# Patient Record
Sex: Female | Born: 1965 | Race: White | Hispanic: No | Smoking: Former smoker
Health system: Southern US, Community
[De-identification: ages and names within clinical notes are randomized; demographics above are authoritative.]

## PROBLEM LIST (undated history)

## (undated) DIAGNOSIS — K219 Gastro-esophageal reflux disease without esophagitis: Secondary | ICD-10-CM

## (undated) DIAGNOSIS — M1990 Unspecified osteoarthritis, unspecified site: Secondary | ICD-10-CM

## (undated) DIAGNOSIS — R0600 Dyspnea, unspecified: Secondary | ICD-10-CM

## (undated) DIAGNOSIS — R51 Headache: Secondary | ICD-10-CM

## (undated) DIAGNOSIS — K2270 Barrett's esophagus without dysplasia: Secondary | ICD-10-CM

## (undated) DIAGNOSIS — M722 Plantar fascial fibromatosis: Secondary | ICD-10-CM

## (undated) DIAGNOSIS — F909 Attention-deficit hyperactivity disorder, unspecified type: Secondary | ICD-10-CM

## (undated) DIAGNOSIS — F4310 Post-traumatic stress disorder, unspecified: Secondary | ICD-10-CM

## (undated) DIAGNOSIS — C320 Malignant neoplasm of glottis: Secondary | ICD-10-CM

## (undated) DIAGNOSIS — L409 Psoriasis, unspecified: Secondary | ICD-10-CM

## (undated) DIAGNOSIS — R232 Flushing: Secondary | ICD-10-CM

## (undated) DIAGNOSIS — F32A Depression, unspecified: Secondary | ICD-10-CM

## (undated) DIAGNOSIS — R519 Headache, unspecified: Secondary | ICD-10-CM

## (undated) DIAGNOSIS — I739 Peripheral vascular disease, unspecified: Secondary | ICD-10-CM

## (undated) DIAGNOSIS — F419 Anxiety disorder, unspecified: Secondary | ICD-10-CM

## (undated) DIAGNOSIS — E669 Obesity, unspecified: Secondary | ICD-10-CM

## (undated) DIAGNOSIS — K589 Irritable bowel syndrome without diarrhea: Secondary | ICD-10-CM

## (undated) DIAGNOSIS — F329 Major depressive disorder, single episode, unspecified: Secondary | ICD-10-CM

## (undated) DIAGNOSIS — G8929 Other chronic pain: Secondary | ICD-10-CM

## (undated) DIAGNOSIS — M797 Fibromyalgia: Secondary | ICD-10-CM

## (undated) DIAGNOSIS — R1031 Right lower quadrant pain: Secondary | ICD-10-CM

## (undated) DIAGNOSIS — J449 Chronic obstructive pulmonary disease, unspecified: Secondary | ICD-10-CM

## (undated) HISTORY — PX: BACK SURGERY: SHX140

## (undated) HISTORY — DX: Right lower quadrant pain: R10.31

## (undated) HISTORY — DX: Psoriasis, unspecified: L40.9

## (undated) HISTORY — DX: Barrett's esophagus without dysplasia: K22.70

## (undated) HISTORY — DX: Post-traumatic stress disorder, unspecified: F43.10

## (undated) HISTORY — PX: CARPAL TUNNEL RELEASE: SHX101

## (undated) HISTORY — DX: Irritable bowel syndrome, unspecified: K58.9

## (undated) HISTORY — PX: TUBAL LIGATION: SHX77

## (undated) HISTORY — PX: CLEFT PALATE REPAIR: SUR1165

## (undated) HISTORY — PX: NOSE SURGERY: SHX723

## (undated) HISTORY — PX: ENDOMETRIAL ABLATION: SHX621

## (undated) HISTORY — DX: Attention-deficit hyperactivity disorder, unspecified type: F90.9

## (undated) HISTORY — PX: LAPAROSCOPIC TUBAL LIGATION: SHX1937

## (undated) HISTORY — PX: TONSILLECTOMY: SUR1361

## (undated) HISTORY — DX: Flushing: R23.2

## (undated) HISTORY — DX: Peripheral vascular disease, unspecified: I73.9

## (undated) HISTORY — DX: Plantar fascial fibromatosis: M72.2

## (undated) HISTORY — DX: Obesity, unspecified: E66.9

---

## 1998-07-29 HISTORY — PX: THROAT SURGERY: SHX803

## 1999-08-11 ENCOUNTER — Ambulatory Visit: Admission: RE | Admit: 1999-08-11 | Discharge: 1999-08-11 | Payer: Self-pay | Admitting: Neurology

## 2000-03-14 ENCOUNTER — Encounter (INDEPENDENT_AMBULATORY_CARE_PROVIDER_SITE_OTHER): Payer: Self-pay | Admitting: Specialist

## 2000-03-14 ENCOUNTER — Ambulatory Visit (HOSPITAL_COMMUNITY): Admission: RE | Admit: 2000-03-14 | Discharge: 2000-03-15 | Payer: Self-pay | Admitting: *Deleted

## 2001-02-09 ENCOUNTER — Other Ambulatory Visit: Admission: RE | Admit: 2001-02-09 | Discharge: 2001-02-09 | Payer: Self-pay | Admitting: Orthopedic Surgery

## 2001-03-03 ENCOUNTER — Encounter: Admission: RE | Admit: 2001-03-03 | Discharge: 2001-03-24 | Payer: Self-pay | Admitting: Orthopedic Surgery

## 2002-06-21 ENCOUNTER — Ambulatory Visit (HOSPITAL_COMMUNITY): Admission: RE | Admit: 2002-06-21 | Discharge: 2002-06-21 | Payer: Self-pay | Admitting: *Deleted

## 2006-12-12 ENCOUNTER — Ambulatory Visit (HOSPITAL_COMMUNITY): Admission: RE | Admit: 2006-12-12 | Discharge: 2006-12-12 | Payer: Self-pay | Admitting: Emergency Medicine

## 2006-12-16 ENCOUNTER — Other Ambulatory Visit: Admission: RE | Admit: 2006-12-16 | Discharge: 2006-12-16 | Payer: Self-pay | Admitting: Unknown Physician Specialty

## 2006-12-16 ENCOUNTER — Encounter (INDEPENDENT_AMBULATORY_CARE_PROVIDER_SITE_OTHER): Payer: Self-pay | Admitting: Unknown Physician Specialty

## 2006-12-16 ENCOUNTER — Ambulatory Visit (HOSPITAL_COMMUNITY): Admission: RE | Admit: 2006-12-16 | Discharge: 2006-12-16 | Payer: Self-pay | Admitting: Family Medicine

## 2007-01-28 ENCOUNTER — Ambulatory Visit (HOSPITAL_COMMUNITY): Admission: RE | Admit: 2007-01-28 | Discharge: 2007-01-28 | Payer: Self-pay | Admitting: Family Medicine

## 2007-02-09 ENCOUNTER — Ambulatory Visit (HOSPITAL_COMMUNITY): Admission: RE | Admit: 2007-02-09 | Discharge: 2007-02-09 | Payer: Self-pay | Admitting: Obstetrics and Gynecology

## 2007-02-16 ENCOUNTER — Encounter: Admission: RE | Admit: 2007-02-16 | Discharge: 2007-02-16 | Payer: Self-pay | Admitting: Family Medicine

## 2007-02-16 ENCOUNTER — Encounter (INDEPENDENT_AMBULATORY_CARE_PROVIDER_SITE_OTHER): Payer: Self-pay | Admitting: Diagnostic Radiology

## 2007-08-19 ENCOUNTER — Ambulatory Visit (HOSPITAL_COMMUNITY): Admission: RE | Admit: 2007-08-19 | Discharge: 2007-08-19 | Payer: Self-pay | Admitting: Family Medicine

## 2008-02-22 ENCOUNTER — Ambulatory Visit (HOSPITAL_COMMUNITY): Admission: RE | Admit: 2008-02-22 | Discharge: 2008-02-22 | Payer: Self-pay | Admitting: Family Medicine

## 2008-05-14 ENCOUNTER — Emergency Department (HOSPITAL_COMMUNITY): Admission: EM | Admit: 2008-05-14 | Discharge: 2008-05-14 | Payer: Self-pay | Admitting: Emergency Medicine

## 2009-02-22 ENCOUNTER — Ambulatory Visit (HOSPITAL_COMMUNITY): Admission: RE | Admit: 2009-02-22 | Discharge: 2009-02-22 | Payer: Self-pay | Admitting: Family Medicine

## 2009-04-28 ENCOUNTER — Encounter: Admission: RE | Admit: 2009-04-28 | Discharge: 2009-04-28 | Payer: Self-pay | Admitting: Neurology

## 2009-05-12 ENCOUNTER — Encounter: Admission: RE | Admit: 2009-05-12 | Discharge: 2009-05-12 | Payer: Self-pay | Admitting: Neurology

## 2009-06-08 ENCOUNTER — Encounter: Admission: RE | Admit: 2009-06-08 | Discharge: 2009-06-08 | Payer: Self-pay | Admitting: Neurology

## 2009-09-12 ENCOUNTER — Encounter: Admission: RE | Admit: 2009-09-12 | Discharge: 2009-12-11 | Payer: Self-pay | Admitting: Neurology

## 2009-11-02 DIAGNOSIS — R109 Unspecified abdominal pain: Secondary | ICD-10-CM | POA: Insufficient documentation

## 2009-11-02 DIAGNOSIS — M549 Dorsalgia, unspecified: Secondary | ICD-10-CM | POA: Insufficient documentation

## 2009-11-02 DIAGNOSIS — G2581 Restless legs syndrome: Secondary | ICD-10-CM

## 2009-11-02 DIAGNOSIS — R197 Diarrhea, unspecified: Secondary | ICD-10-CM | POA: Insufficient documentation

## 2009-11-02 DIAGNOSIS — R110 Nausea: Secondary | ICD-10-CM

## 2009-11-02 DIAGNOSIS — K5900 Constipation, unspecified: Secondary | ICD-10-CM

## 2009-11-02 DIAGNOSIS — F329 Major depressive disorder, single episode, unspecified: Secondary | ICD-10-CM

## 2009-11-02 DIAGNOSIS — Z8719 Personal history of other diseases of the digestive system: Secondary | ICD-10-CM

## 2009-11-02 DIAGNOSIS — K589 Irritable bowel syndrome without diarrhea: Secondary | ICD-10-CM

## 2009-11-02 DIAGNOSIS — K5909 Other constipation: Secondary | ICD-10-CM | POA: Insufficient documentation

## 2009-11-02 DIAGNOSIS — K219 Gastro-esophageal reflux disease without esophagitis: Secondary | ICD-10-CM

## 2009-11-02 DIAGNOSIS — M129 Arthropathy, unspecified: Secondary | ICD-10-CM | POA: Insufficient documentation

## 2009-11-03 ENCOUNTER — Ambulatory Visit: Payer: Self-pay | Admitting: Gastroenterology

## 2009-11-03 DIAGNOSIS — A048 Other specified bacterial intestinal infections: Secondary | ICD-10-CM | POA: Insufficient documentation

## 2009-11-08 ENCOUNTER — Encounter: Payer: Self-pay | Admitting: Gastroenterology

## 2009-12-13 ENCOUNTER — Ambulatory Visit: Payer: Self-pay | Admitting: Internal Medicine

## 2009-12-20 ENCOUNTER — Ambulatory Visit: Payer: Self-pay | Admitting: Gastroenterology

## 2010-02-26 ENCOUNTER — Ambulatory Visit (HOSPITAL_COMMUNITY): Admission: RE | Admit: 2010-02-26 | Discharge: 2010-02-26 | Payer: Self-pay | Admitting: Family Medicine

## 2010-08-28 NOTE — Assessment & Plan Note (Signed)
Summary: dropped off stool/ss   Pt returned on ifobt and it was negative.     Allergies: No Known Drug Allergies  Other Orders: Immuno-chemical Fecal Occult (16109)  Appended Document: dropped off stool/ss pt aware

## 2010-08-28 NOTE — Assessment & Plan Note (Signed)
Summary: fu6wks, abd pain,h pylori infection.gu   Visit Type:  f/u Primary Care Provider:  Freida Busman, NP  Chief Complaint:  follow up- doing better.  History of Present Illness: Feeling better. Less cramps. Having BM about every other day on Colace (3) at bedtime. Never started Benefiber, states fiber makes her nauseated and doesn't seem to help. Passed hard stool with blood mixed in the day she collected stool for H.Pylori. No blood noted since. No melena. Still some bloating but better. Recent H.Pylori stool Ag negative.    Current Medications (verified): 1)  Nexium 40 Mg Pack (Esomeprazole Magnesium) .... Once Daily 2)  Levsin Sl .... As Directed 3)  Diclofenac Sodium 75 Mg Tbec (Diclofenac Sodium) .... Two Times A Day 4)  Nortriptyline Hcl 10 Mg Caps (Nortriptyline Hcl) .... 3 At Bedtime 5)  Ultracet 37.5-325 Mg Tabs (Tramadol-Acetaminophen) .... 2 Two Times A Day 6)  Probiotic Digestive Advantage .... Once Daily 7)  Colace 100 Mg Caps (Docusate Sodium) .... 3 By Mouth Qhs  Allergies (verified): No Known Drug Allergies  Review of Systems      See HPI  Vital Signs:  Patient profile:   45 year old female Height:      66 inches Weight:      249 pounds BMI:     40.33 Temp:     98.6 degrees F oral Pulse rate:   68 / minute BP sitting:   110 / 70  (left arm) Cuff size:   large  Vitals Entered By: Hendricks Limes LPN (Dec 13, 2009 10:14 AM)  Physical Exam  General:  Well developed, well nourished, no acute distress. Head:  Normocephalic and atraumatic. Eyes:  Sclera nonicteric. Mouth:  OP moist. Abdomen:  Bowel sounds normal.  Abdomen is soft, nontender, nondistended.  No rebound or guarding.  No hepatosplenomegaly, masses or hernias.  No abdominal bruits.  Extremities:  No clubbing, cyanosis, edema or deformities noted. Neurologic:  Alert and  oriented x4;  grossly normal neurologically. Skin:  Intact without significant lesions or rashes. Psych:  Alert and cooperative.  Normal mood and affect.  Impression & Recommendations:  Problem # 1:  IRRITABLE BOWEL SYNDROME (ICD-564.1)  Doing better on probiotics/levsin. Less cramping. Still with some bloating and constipation. Add Miralax 17 grams by mouth daily.   Orders: Est. Patient Level II (81191)  Problem # 2:  RECTAL BLEEDING, HX OF (ICD-V12.79)  Recently saw fresh blood mixed in stool X 1. Last TCS 2003, patchy colitis/proctitis possible due to NSAIDS/ASA at that time. Will discuss with Dr. Darrick Penna. Consider repeat TCS. Check ifobt.  Orders: Est. Patient Level II (47829)  Problem # 3:  HELICOBACTER PYLORI [H. PYLORI] INFECTION (ICD-041.86) Eradicated, based on negative H.Pylori stool Ag.  Problem # 4:  GASTROESOPHAGEAL REFLUX DISEASE, CHRONIC (ICD-530.81) Controlled on Nexium.  Patient Instructions: 1)  Add Miralax 17 grams by mouth daily for constipation. 2)  Complete stool ifobt and return to office. 3)  The medication list was reviewed and reconciled.  All changed / newly prescribed medications were explained.  A complete medication list was provided to the patient / caregiver.  Appended Document: fu6wks, abd pain,h pylori infection.gu Please let patient know, [facility] recommends TCS for further evaluation of recent rectal bleeding. Please schedule.   Appended Document: fu6wks, abd pain,h pylori infection.gu pt aware, she does not have any insurance and gets 60%assistance from [facility], but she is still scared it will cost too much. encouraged pt to call Luber[facility] and see if there  was anything she could do. pt stated she would and will call us back and let us know what she is going to do. Informed pt ifobt is negative.
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
## 2010-12-14 NOTE — Op Note (Signed)
NAME:  [name], [name]                          ACCOUNT NO.:  [id number]   MEDICAL RECORD NO.:  [id number]                   PATIENT TYPE:  AMB   LOCATION:  DAY                                  FACILITY:  [facility]   PHYSICIAN:  Lionel December, M.D.                 DATE OF BIRTH:  [birth year]   DATE OF PROCEDURE:  06/21/2002  DATE OF DISCHARGE:                                 OPERATIVE REPORT   PROCEDURE:  Esophagogastroduodenoscopy followed by total colonoscopy.   INDICATIONS FOR PROCEDURE:  An active 45 year old Caucasian female who  presents with acute onset of LLQ abdominal pain, rectal bleeding and melena.  She also gives a history of heartburn of 4-5 years duration and presently on  PPI.  Therefore an EGG was also recommended. Both the procedure and risks  were reviewed with the patient and informed consent was obtained.   PREOP MEDICATIONS:  Cetacaine spray for pharyngeal topical anesthesia,  Demerol 75 mg IV in divided dose, Versed 10 mg IV in divided dose.   INSTRUMENT:  Olympus video system.   FINDING:  The procedure performed in endoscopy suite. The patient's vital  signs and O2 saturations were monitored during the procedure and remained  stable.   1. Esophagogastroduodenoscopy.  The patient was placed in the left lateral     decubitus position and the scope passed through the oropharynx without     any difficulty into the esophagus.   ESOPHAGUS:  The mucosa of the esophagus normal except there was a linear  erosion in the distal esophagus along with another erosion at the GE  junction. The mucosa of the GE junction was serrated with two small  extensions of gastric type mucosa in a cephalad direction. This was high  lighted on endoscopic pictures. There was a small sliding hiatal hernia.   STOMACH:  It was empty and distended very well with insufflation. The folds  in the proximal stomach are normal. Examination of the mucosa at the gastric  body and antrum revealed  granularity and patchy erythema. There were no  erosions or ulcers. The pyloric channel was patent. The rest of the fundus  was examined by retroflexing the scope and was normal.   DUODENUM:  Examination of the bulb and second part of the duodenum was  normal. The endoscope was withdrawn and the patient was prepared for  procedure #2.   TOTAL COLONOSCOPY.  Rectal examination performed. No abnormalities noted on  external or digital exam. The scope was placed in the rectum where there was  intense erythema to the mucosa and rectosigmoid junction with multiple  erosions. Less pronounced changes were seen in the distal sigmoid colon. The  mucosa above that was normal. Preparation was fair. She had thick liquid  stool in the right colon. The scope was passed to the cecum which was  identified by the appendiceal orifice and the ileocecal valve. A  picture of  the appendiceal orifice was taken for the record. As the scope was  withdrawn, the colonic mucosa was once again carefully examined. Patchy  erythema and few erosions were noted at the distal sigmoid colon with most  pronounced changes at rectosigmoid and rectum. Biopsies taken from the  rectosigmoid junction and submitted in one container. While in the rectum,  the scope was retroflexed to examine the anorectum and she had small  hemorrhoids below the dentate line. The endoscope was straightened and  withdrawn. The patient tolerated the procedure well.   FINAL DIAGNOSES:  1. Erosive reflux esophagitis with serrated GE junction and a small sliding     hiatal hernia.  2. Nonerosive gastritis.  3. Patchy colitis involving the rectum and distal sigmoid colon which would     explain her acute symptomatology. This could be an infection or     nonspecific.   RECOMMENDATIONS:  1. She will continue antireflux measures and Nexium as before.  2. She must try to lose some weight.  3. H. pylori serology was checked today.  4. Stool sample was  obtained and sent to lab for cultures for enteric     pathogens.  5. Will start her on Rowasa enema per rectum at bedtime for a total of two     weeks.  6. I will be contacting the patient with results of pending studies and     further recommendations.                                               Lionel December, M.D.    NR/MEDQ  D:  06/21/2002  T:  06/21/2002  Job:  [id number]   cc:   [address]  [place]  [place] [address]  Fax: [phone]
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
## 2010-12-14 NOTE — Op Note (Signed)
[place]. [facility]  Patient:    [name], [name] Visit Number: [id number] MRN: [id number]          Service Type: DSU Location: [id number] Attending Physician:  Carlena Sax Proc. Date: 03/14/00 Admit Date:  03/14/2000 Discharge Date: 03/15/2000   CC:         Barbaraann Share, M.D. [facility]  Marlan Palau, M.D.   Operative Report  PREOPERATIVE DIAGNOSIS: Obstructive sleep apnea and nasal septal deviation, inferior turbinate hypertrophy, elongated soft palate and uvula and palate hypertrophy.  POSTOPERATIVE DIAGNOSIS: Obstructive sleep apnea and nasal septal deviation, inferior turbinate hypertrophy, elongated soft palate and uvula and palate hypertrophy.  PROCEDURE PERFORMED:  Tonsillectomy, uvulopalatopharyngeoplasty, nasal septal reconstruction, ______  cauterization of both inferior turbinates.  SURGEON:  Veverly Fells. Arletha Grippe, M.D.  ANESTHESIA:  General endotracheal.  INDICATIONS FOR SURGERY:  This is a 45 year old white female gives a history of daytime somnolence, morning fatigue and loud snoring at night and nasal airway obstruction.  She has had an inpatient polysomnogram this revealed mild obstructive sleep apnea.  She has tried to use nasal CPAP but is a stomach sleeper and is unable too tolerate it due to the way that she sleeps at home. Physical examination does reveal a septal deviation to the right, congestion of the inferior turbinates, elongated soft palate and uvula and tonsillar hypertrophy.  She has failed aggressive medical therapy I have recommended proceeding with the above noted surgical procedure.  I have discussed extensively with her the risk and benefits of surgery including risk of general anesthesia, infection, bleeding, uvulopharyngeal insufficiency and the normal recovery period after this type of surgery.  I have entertained questions answered them appropriate and informed consent has been obtained and patient  presents for the above noted procedure.  OPERATIVE FINDINGS:  Septal deviation, right inferior turbinate congestion and hypertrophy, elongated soft palate and uvula and mild tonsillar hypertrophy.  DETAILS OF PROCEDURE PERFORMED:  The patient was placed in the supine position general endotracheal anesthesia was administered via the anesthesiologist without complications.  She was administered 1 gram of Ancef IV times one and 10 mg of Decadron IV times one.  The head of the table was turned 90 degrees patients face was draped in the standard fashion.  Crowe-Davis mouth retractor was inserted in the oral cavity this was used to retract the mouth open.  Curved Allis clamp was used to grasp the right tonsil and retracted medially.  The hormonic scalpel was then used to dissect the tonsil free from the tonsillar fossa and bleeding was controlled with a combination of hormonic scalpel and suction cautery without difficulty.  The tonsil was then transected at the tongue base and removed through the oral cavity and sent to pathology for permanent section analysis.  Next attention was turned to the left tonsil and an identical procedure was carried out on this side compared to the right side with identical results. The bleeding from the tonsillar fossa was controlled meticulously with suction cautery without difficulty.  Next the uvula was then transected right at the junction of the soft palate using the hormonic scalpel without difficulty.  The cut edges of the uvula and soft palate and left tonsillar pillar were reapproximated with interrupted 3-0 Vicryl suture without difficulty.  There was not enough redundant pharyngeal mucosa to close the right tonsillar fossa so that was left to [facility] on its own.  Four cc of 0.5% Marcaine solution with 1:200,000 epinephrine were infiltrated into the anterior tonsillar  pillars and slouged out in tongue base bilaterally.  Crowe-Davis mouth retractor  was released and brought out through the oral cavity without incident.  Next attention was turned to the nasal chamber.  Cotton pledgets soaked in a 4% cocaine solution was placed on either side of the septum and left in the nasal chambers for approximately five to ten minutes then removed.  Both sides were suctioned and infiltrated with a 1% lidocaine solution with 1:1000 epinephrine.  After waiting approximately ten minutes the standard Kelly incision was made in the left side of the septum and the mucoperichondrium mucoperiosteal flap was elevated in the left side using both blunt and sharp dissection.  An intercartilaginous incision was made approximately 1 cm posterior from the caudal end of the septum.  The mucoperichondrium and mucoperiosteal flap on the right side using both blunt and sharp dissection. Cartilaginous deviation was was moved further down ______.  The posterior bony destruction was removed with the Jansen-Middleton forceps.  Trimmed morcellized cartilage was placed in between the septal flaps the septal incision was closed with interrupted chromic suture it was reinforced with a running transseptal plain gut mattress stitch.  Both inferior turbinates were injected with a total of 6 cc of a 1% lidocaine solution with 1:1000 epinephrine and both inferior turbinates were then cauterized using the Elmed instrument cauterization using a 12 watt setting.  Both inferior turbinates were treated with three passes and were outfractured using central lateral pressure with a large nasal speculum.  The splints soaked in a Bactroban ointment solution were placed on either side of the septum held in place with a transseptal Prolene suture.  An oral gastric tube was placed this was used to decompress the stomach contents this was then removed without incident. Fluids given during the procedure approximately 1 liter of crystalloid.  Estimated blood loss was less than 50 cc, urine was  not measured.  There were no drains, two dural splints were placed.  Specimens sent were septal cartilage and bone for gross pathology only, tonsils times two and uvula portion of soft palate.  The patient tolerated the procedure well without complications, extubated in the operating room and transferred to the recovery room in stable condition. The sponge, needle and instrument counts were correct during procedure.  Total duration of procedure was approximately 2 hours.  The patient will be admitted for overnight recovery once she has recovered well she will be sent home on March 15, 2000.  She will be sent home on Augmentin Elixir 400 mg p.o. t.i.d. for two weeks and Lortab Elixir 250 cc with two refills 10-15 cc p.o. q4h p.r.n. pain.  She is to have light activity, postoperative recommended diet for two weeks after surgery.  Both her and her friend were given oral and written instructions.  They are to call for any problems of bleeding, fever or vomiting, pain or extra medications or any questions.  She will follow up for splint removal on Thursday August 23rd at 1:50 PM. Attending Physician:  Carlena Sax DD:  03/14/00 TD:  03/15/00 Job: [id number]

## 2011-04-30 LAB — POCT I-STAT, CHEM 8
BUN: 14
Calcium, Ion: 0.95 — ABNORMAL LOW
Chloride: 110
Creatinine, Ser: 0.8
Glucose, Bld: 102 — ABNORMAL HIGH
HCT: 43
Hemoglobin: 14.6
Potassium: 5.1
Sodium: 137
TCO2: 23

## 2011-04-30 LAB — POCT CARDIAC MARKERS
CKMB, poc: <1 — ABNORMAL LOW
Myoglobin, poc: 42.4
Troponin i, poc: <0.05

## 2011-05-23 ENCOUNTER — Emergency Department (HOSPITAL_BASED_OUTPATIENT_CLINIC_OR_DEPARTMENT_OTHER)
Admission: EM | Admit: 2011-05-23 | Discharge: 2011-05-23 | Disposition: A | Discharge status: Home / Self Care | Payer: Self-pay | Attending: Emergency Medicine | Admitting: Emergency Medicine

## 2011-05-23 ENCOUNTER — Emergency Department (INDEPENDENT_AMBULATORY_CARE_PROVIDER_SITE_OTHER): Payer: Self-pay

## 2011-05-23 ENCOUNTER — Encounter: Payer: Self-pay | Admitting: *Deleted

## 2011-05-23 DIAGNOSIS — F17200 Nicotine dependence, unspecified, uncomplicated: Secondary | ICD-10-CM | POA: Insufficient documentation

## 2011-05-23 DIAGNOSIS — K219 Gastro-esophageal reflux disease without esophagitis: Secondary | ICD-10-CM | POA: Insufficient documentation

## 2011-05-23 DIAGNOSIS — M549 Dorsalgia, unspecified: Secondary | ICD-10-CM

## 2011-05-23 DIAGNOSIS — M479 Spondylosis, unspecified: Secondary | ICD-10-CM

## 2011-05-23 DIAGNOSIS — M545 Low back pain: Secondary | ICD-10-CM

## 2011-05-23 DIAGNOSIS — M47817 Spondylosis without myelopathy or radiculopathy, lumbosacral region: Secondary | ICD-10-CM | POA: Insufficient documentation

## 2011-05-23 DIAGNOSIS — M5126 Other intervertebral disc displacement, lumbar region: Secondary | ICD-10-CM

## 2011-05-23 DIAGNOSIS — Z79899 Other long term (current) drug therapy: Secondary | ICD-10-CM | POA: Insufficient documentation

## 2011-05-23 HISTORY — DX: Gastro-esophageal reflux disease without esophagitis: K21.9

## 2011-05-23 HISTORY — DX: Malignant neoplasm of glottis: C32.0

## 2011-05-23 MED ORDER — OXYCODONE-ACETAMINOPHEN 5-325 MG PO TABS
1.0000 | ORAL_TABLET | Freq: Once | ORAL | Status: AC
  Administered 2011-05-23: 1 via ORAL
  Filled 2011-05-23: qty 1

## 2011-05-23 MED ORDER — DIAZEPAM 5 MG PO TABS
5.0000 mg | ORAL_TABLET | Freq: Once | ORAL | Status: AC
  Administered 2011-05-23: 5 mg via ORAL
  Filled 2011-05-23: qty 1

## 2011-05-23 MED ORDER — OXYCODONE-ACETAMINOPHEN 5-325 MG PO TABS: 2.0000 | ORAL_TABLET | ORAL | Status: AC | PRN

## 2011-05-23 MED ORDER — METHOCARBAMOL 500 MG PO TABS: 500.0000 mg | ORAL_TABLET | Freq: Two times a day (BID) | ORAL | Status: AC

## 2011-05-23 NOTE — ED Provider Notes (Signed)
History     CSN: [id number] Arrival date & time: 05/23/2011  8:49 PM   First MD Initiated Contact with Patient 05/23/11 2053      Chief Complaint  Patient presents with  . Back Pain    (Consider location/radiation/quality/duration/timing/severity/associated sxs/prior treatment) HPI Comments: Patient complains of 4 days of low back pain is worsening. It is across the entire low back in the midline and radiates into her left buttock and lower leg. There is no associated weakness, numbness, tingling. She denies any bowel or bladder incontinence. She denies any fever or vomiting. She taking Ultracet without relief. 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  Patient is a 45 y.o. female presenting with back pain. The history is provided by the patient.  Back Pain  The current episode started more than 2 days ago. The problem occurs constantly. The problem has been gradually worsening. The pain is associated with lifting heavy objects and twisting. The pain is present in the lumbar spine and sacro-iliac joint. The quality of the pain is described as aching. The pain radiates to the left thigh. The pain is moderate. The symptoms are aggravated by bending and twisting. Pertinent negatives include no chest pain, no fever, no numbness, no abdominal pain, no bowel incontinence, no bladder incontinence, no dysuria, no paresthesias and no weakness. She has tried NSAIDs and analgesics for the symptoms. The treatment provided mild relief.    Past Medical History  Diagnosis Date  . GERD (gastroesophageal reflux disease)   . Cancer   . Vocal cord cancer     Past Surgical History  Procedure Date  . Carpal tunnel release   . Nose surgery   . Tonsillectomy   . Cleft palate repair     No family history on file.  History  Substance Use Topics  . Smoking status: Current Everyday Smoker -- 0.5 packs/day  . Smokeless tobacco: Not on  file  . Alcohol Use: Yes     occasional    OB History    Grav Para Term Preterm Abortions TAB SAB Ect Mult Living                  Review of Systems  Constitutional: Negative for fever, activity change and appetite change.  HENT: Negative for rhinorrhea.   Cardiovascular: Negative for chest pain.  Gastrointestinal: Negative for nausea, vomiting, abdominal pain and bowel incontinence.  Genitourinary: Negative for bladder incontinence, dysuria, frequency and hematuria.  Musculoskeletal: Positive for back pain.  Neurological: Negative for weakness, numbness and paresthesias.    Allergies  Review of patient's allergies indicates no known allergies.  Home Medications   Current Outpatient Rx  Name Route Sig Dispense Refill  . ESOMEPRAZOLE MAGNESIUM 40 MG PO CPDR Oral Take 40 mg by mouth daily.      Marland Kitchen PROBIOTIC FORMULA PO Oral Take 1 tablet by mouth daily.      . TRAMADOL-ACETAMINOPHEN 37.5-325 MG PO TABS Oral Take 2 tablets by mouth 2 (two) times daily.      Marland Kitchen METHOCARBAMOL 500 MG PO TABS Oral Take 1 tablet (500 mg total) by mouth 2 (two) times daily. 20 tablet 0  . OXYCODONE-ACETAMINOPHEN 5-325 MG PO TABS Oral Take 2 tablets by mouth every 4 (four) hours as needed for pain. 15 tablet 0    BP 156/78  Pulse 69  Temp(Src) 98.1 F (36.7 C) (Oral)  Resp 18  Ht 5\' 6"  (1.676  m)  Wt 245 lb (111.131 kg)  BMI 39.54 kg/m2  SpO2 100%  LMP 05/02/2011  Physical Exam  Constitutional: She is oriented to person, place, and time. She appears well-developed and well-nourished. No distress.  HENT:  Head: Normocephalic and atraumatic.  Mouth/Throat: Oropharynx is clear and moist. No oropharyngeal exudate.  Eyes: Conjunctivae are normal. Pupils are equal, round, and reactive to light.  Neck: Normal range of motion.  Cardiovascular: Normal rate, regular rhythm and normal heart sounds.   Pulmonary/Chest: Effort normal and breath sounds normal. No respiratory distress.  Abdominal: Soft.  There is no tenderness. There is no rebound and no guarding.  Musculoskeletal:       Tender to palpation in the lumbar and sacral spine no step-off or deformity.  Neurological: She is alert and oriented to person, place, and time. No cranial nerve deficit.       5 out of 5 strength in bilateral lower extremities, positive patellar reflexes bilaterally, able to dorsiflex great toes bilaterally  Skin: Skin is warm.    ED Course  Procedures (including critical care time)  Labs Reviewed - No data to display Dg Lumbar Spine Complete  05/23/2011  *RADIOLOGY REPORT*  Clinical Data: Back pain.  Numbness of left lower extremity.  LUMBAR SPINE - COMPLETE 4+ VIEW  Comparison: Lumbar MRI 06/08/2009.  Findings: No compression fracture or traumatic subluxation.  4 mm of degenerative anterolisthesis L3 on L4, associated disc space narrowing. Lower lumbar facet arthropathy.  Bilateral tubal ligation clips.  Moderate stool burden.  IMPRESSION: L3-L4 spondylosis. 4 mm degenerative slip at this level. This has progressed since 2010.  Original Report Authenticated By: Elsie Stain, M.D.     1. Spondylosis   2. Back pain       MDM  Low back pain with history of lifting, no neurological deficit or red flags.  With midline pain and oncologic history, will obtain imaging and treat symptoms.  Neurosurgery referral provided      Glynn Octave, MD 05/24/11 [phone]

## 2011-05-23 NOTE — ED Notes (Signed)
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

## 2011-06-11 ENCOUNTER — Encounter (HOSPITAL_COMMUNITY): Payer: Self-pay

## 2011-06-11 ENCOUNTER — Emergency Department (HOSPITAL_COMMUNITY)
Admission: EM | Admit: 2011-06-11 | Discharge: 2011-06-11 | Disposition: A | Discharge status: Home / Self Care | Payer: Self-pay | Attending: Emergency Medicine | Admitting: Emergency Medicine

## 2011-06-11 DIAGNOSIS — N939 Abnormal uterine and vaginal bleeding, unspecified: Secondary | ICD-10-CM

## 2011-06-11 DIAGNOSIS — N898 Other specified noninflammatory disorders of vagina: Secondary | ICD-10-CM | POA: Insufficient documentation

## 2011-06-11 DIAGNOSIS — K219 Gastro-esophageal reflux disease without esophagitis: Secondary | ICD-10-CM | POA: Insufficient documentation

## 2011-06-11 DIAGNOSIS — Z8521 Personal history of malignant neoplasm of larynx: Secondary | ICD-10-CM | POA: Insufficient documentation

## 2011-06-11 DIAGNOSIS — F17200 Nicotine dependence, unspecified, uncomplicated: Secondary | ICD-10-CM | POA: Insufficient documentation

## 2011-06-11 LAB — CBC
HCT: 44 % (ref 36.0–46.0)
Hemoglobin: 14.2 g/dL (ref 12.0–15.0)
MCH: 31 pg (ref 26.0–34.0)
MCHC: 32.3 g/dL (ref 30.0–36.0)

## 2011-06-11 LAB — BASIC METABOLIC PANEL
BUN: 16 mg/dL (ref 6–23)
Chloride: 102 mEq/L (ref 96–112)
Creatinine, Ser: 0.94 mg/dL (ref 0.50–1.10)
GFR calc Af Amer: 83 mL/min — ABNORMAL LOW (ref >90)
GFR calc non Af Amer: 72 mL/min — ABNORMAL LOW (ref >90)
Glucose, Bld: 93 mg/dL (ref 70–99)

## 2011-06-11 LAB — DIFFERENTIAL
Basophils Relative: 0 % (ref 0–1)
Eosinophils Absolute: 0.1 10*3/uL (ref 0.0–0.7)
Lymphs Abs: 3.6 10*3/uL (ref 0.7–4.0)
Monocytes Absolute: 0.7 10*3/uL (ref 0.1–1.0)
Monocytes Relative: 8 % (ref 3–12)

## 2011-06-11 NOTE — ED Provider Notes (Signed)
History    Scribed for Samantha Lennert, MD, the patient was seen in room APA09/APA09. This chart was scribed by Katha Cabal.   CSN: [id number] Arrival date & time: 06/11/2011  8:28 PM   First MD Initiated Contact with Patient 06/11/11 2040      Chief Complaint  Patient presents with  . Vaginal Bleeding    (Consider location/radiation/quality/duration/timing/severity/associated sxs/prior treatment) Patient is a 45 y.o. female presenting with vaginal bleeding. The history is provided by the patient. No language interpreter was used.  Vaginal Bleeding Chronicity: ongoing. Episode onset: about 3 weeks ago. The problem occurs constantly. The problem has not changed since onset.Pertinent negatives include no chest pain and no headaches. The symptoms are relieved by nothing.  Patient states vaginal bleeding at times heavier and at times lighter than normal menstrual bleeding.  Patient reports mild vaginal pain. Symptom is associated with tiredness. Patient taking iron pills for tiredness.  Patient has not had an ultrasound. Patient reports Dagoberto Reef at the clinic does her pelvic exams but patient was referred to ED.     Past Medical History  Diagnosis Date  . GERD (gastroesophageal reflux disease)   . Cancer   . Vocal cord cancer     Past Surgical History  Procedure Date  . Carpal tunnel release   . Nose surgery   . Tonsillectomy   . Cleft palate repair     History reviewed. No pertinent family history.  History  Substance Use Topics  . Smoking status: Current Everyday Smoker -- 0.5 packs/day  . Smokeless tobacco: Not on file  . Alcohol Use: Yes     occasional    OB History    Grav Para Term Preterm Abortions TAB SAB Ect Mult Living                  Review of Systems  Constitutional: Negative for fatigue.  HENT: Negative for congestion, sinus pressure and ear discharge.   Eyes: Negative for discharge.  Respiratory: Negative for cough.   Cardiovascular: Negative  for chest pain.  Gastrointestinal: Negative for nausea, vomiting and diarrhea.  Genitourinary: Positive for vaginal bleeding. Negative for frequency and hematuria.  Musculoskeletal: Negative for back pain.  Skin: Negative for rash.  Neurological: Negative for seizures, light-headedness and headaches.  Hematological: Negative.   Psychiatric/Behavioral: Negative for hallucinations.    Allergies  Bee venom  Home Medications   Current Outpatient Rx  Name Route Sig Dispense Refill  . BC HEADACHE POWDER PO Oral Take 1 packet by mouth as needed. For occasional pain     . CETIRIZINE-PSEUDOEPHEDRINE 5-120 MG PO TB12 Oral Take 1 tablet by mouth daily as needed. For allergies     . ESOMEPRAZOLE MAGNESIUM 40 MG PO CPDR Oral Take 40 mg by mouth daily.      Marland Kitchen PROBIOTIC FORMULA PO Oral Take 1 tablet by mouth daily.      . TRAMADOL-ACETAMINOPHEN 37.5-325 MG PO TABS Oral Take 2 tablets by mouth 2 (two) times daily.        BP 124/78  Pulse 82  Temp(Src) 97.9 F (36.6 C) (Oral)  Resp 22  Ht 5\' 6"  (1.676 m)  Wt 245 lb (111.131 kg)  BMI 39.54 kg/m2  SpO2 99%  LMP 05/21/2011  Physical Exam  Constitutional: She is oriented to person, place, and time. She appears well-developed.  HENT:  Head: Normocephalic and atraumatic.  Eyes: Conjunctivae and EOM are normal. No scleral icterus.  Neck: Neck supple. No thyromegaly present.  Cardiovascular: Normal rate and regular rhythm.  Exam reveals no gallop and no friction rub.   No murmur heard. Pulmonary/Chest: No stridor. She has no wheezes. She has no rales. She exhibits no tenderness.  Abdominal: She exhibits no distension. There is no tenderness. There is no rebound.  Genitourinary: There is bleeding around the vagina. No erythema or tenderness around the vagina. No foreign body around the vagina. No signs of injury around the vagina. No vaginal discharge found.       Mild amount of dark blood in vagina   Musculoskeletal: Normal range of motion. She  exhibits no edema.  Lymphadenopathy:    She has no cervical adenopathy.  Neurological: She is alert and oriented to person, place, and time. Coordination normal.  Skin: No rash noted. No erythema.  Psychiatric: She has a normal mood and affect. Her behavior is normal.    ED Course  Procedures (including critical care time)   DIAGNOSTIC STUDIES: Oxygen Saturation is 99% on room air, normal by my interpretation.    COORDINATION OF CARE:    8:48 PM  Initial exam complete.  Will perform pelvic exam.    9:11 PM  Physical and pelvic exam complete.  11:00 PM  Discussed lab findings with patient.   11:03 PM  Plan to discharge patient.  Patient agrees with plan.     Orders Placed This Encounter  Procedures  . CBC  . Differential  . Basic metabolic panel  . Pregnancy, urine     LABS / RADIOLOGY:    Labs Reviewed  BASIC METABOLIC PANEL - Abnormal; Notable for the following:    GFR calc non Af Amer 72 (*)    GFR calc Af Amer 83 (*)    All other components within normal limits  CBC  DIFFERENTIAL  PREGNANCY, URINE   Results for orders placed during the hospital encounter of 06/11/11  CBC      Component Value Range   WBC 8.5  4.0 - 10.5 (K/uL)   RBC 4.58  3.87 - 5.11 (MIL/uL)   Hemoglobin 14.2  12.0 - 15.0 (g/dL)   HCT 16.1  09.6 - 04.5 (%)   MCV 96.1  78.0 - 100.0 (fL)   MCH 31.0  26.0 - 34.0 (pg)   MCHC 32.3  30.0 - 36.0 (g/dL)   RDW 40.9  81.1 - 91.4 (%)   Platelets 377  150 - 400 (K/uL)  DIFFERENTIAL      Component Value Range   Neutrophils Relative 49  43 - 77 (%)   Neutro Abs 4.2  1.7 - 7.7 (K/uL)   Lymphocytes Relative 42  12 - 46 (%)   Lymphs Abs 3.6  0.7 - 4.0 (K/uL)   Monocytes Relative 8  3 - 12 (%)   Monocytes Absolute 0.7  0.1 - 1.0 (K/uL)   Eosinophils Relative 1  0 - 5 (%)   Eosinophils Absolute 0.1  0.0 - 0.7 (K/uL)   Basophils Relative 0  0 - 1 (%)   Basophils Absolute 0.0  0.0 - 0.1 (K/uL)  BASIC METABOLIC PANEL      Component Value Range    Sodium 139  135 - 145 (mEq/L)   Potassium 3.7  3.5 - 5.1 (mEq/L)   Chloride 102  96 - 112 (mEq/L)   CO2 27  19 - 32 (mEq/L)   Glucose, Bld 93  70 - 99 (mg/dL)   BUN 16  6 - 23 (mg/dL)   Creatinine, Ser 7.82  0.50 -  1.10 (mg/dL)   Calcium 9.8  8.4 - 16.1 (mg/dL)   GFR calc non Af Amer 72 (*) >90 (mL/min)   GFR calc Af Amer 83 (*) >90 (mL/min)  PREGNANCY, URINE      Component Value Range   Preg Test, Ur NEGATIVE       No results found.       MDM        MEDICATIONS GIVEN IN THE E.D. Scheduled Meds:   Continuous Infusions:       IMPRESSION: 1. Vaginal bleeding      DISCHARGE MEDICATIONS: New Prescriptions   No medications on file      The chart was scribed for me under my direct supervision.  I personally performed the history, physical, and medical decision making and all procedures in the evaluation of this patient.Samantha Lennert, MD 06/11/11 [phone]

## 2011-06-11 NOTE — ED Notes (Signed)
C/o having period for 3 weeks. Pt called health dept and was instructed to come here. Pt reports having 2 periods a month. Pt changes her pad "every couple of hours" pt reports feeling weak. A/o x3. resp even/nonlabored. C/o pelvic pain.

## 2011-06-18 ENCOUNTER — Other Ambulatory Visit (HOSPITAL_COMMUNITY): Payer: Self-pay | Admitting: Family Medicine

## 2011-06-18 DIAGNOSIS — Z139 Encounter for screening, unspecified: Secondary | ICD-10-CM

## 2011-06-27 ENCOUNTER — Ambulatory Visit (HOSPITAL_COMMUNITY)
Admission: RE | Admit: 2011-06-27 | Discharge: 2011-06-27 | Disposition: A | Discharge status: Home / Self Care | Payer: PRIVATE HEALTH INSURANCE | Source: Ambulatory Visit | Attending: Family Medicine | Admitting: Family Medicine

## 2011-06-27 DIAGNOSIS — Z1231 Encounter for screening mammogram for malignant neoplasm of breast: Secondary | ICD-10-CM | POA: Insufficient documentation

## 2011-06-27 DIAGNOSIS — Z139 Encounter for screening, unspecified: Secondary | ICD-10-CM

## 2011-08-16 ENCOUNTER — Emergency Department (HOSPITAL_BASED_OUTPATIENT_CLINIC_OR_DEPARTMENT_OTHER)
Admission: EM | Admit: 2011-08-16 | Discharge: 2011-08-16 | Disposition: A | Discharge status: Home / Self Care | Payer: Medicaid Other | Attending: Emergency Medicine | Admitting: Emergency Medicine

## 2011-08-16 ENCOUNTER — Encounter (HOSPITAL_BASED_OUTPATIENT_CLINIC_OR_DEPARTMENT_OTHER): Payer: Self-pay | Admitting: *Deleted

## 2011-08-16 DIAGNOSIS — K219 Gastro-esophageal reflux disease without esophagitis: Secondary | ICD-10-CM | POA: Insufficient documentation

## 2011-08-16 DIAGNOSIS — M545 Low back pain, unspecified: Secondary | ICD-10-CM | POA: Insufficient documentation

## 2011-08-16 DIAGNOSIS — F17200 Nicotine dependence, unspecified, uncomplicated: Secondary | ICD-10-CM | POA: Insufficient documentation

## 2011-08-16 DIAGNOSIS — M5410 Radiculopathy, site unspecified: Secondary | ICD-10-CM

## 2011-08-16 MED ORDER — PREDNISONE 10 MG PO TABS: 20.0000 mg | ORAL_TABLET | Freq: Two times a day (BID) | ORAL | Status: DC

## 2011-08-16 MED ORDER — OXYCODONE-ACETAMINOPHEN 5-325 MG PO TABS: 1.0000 | ORAL_TABLET | Freq: Four times a day (QID) | ORAL | Status: AC | PRN

## 2011-08-16 MED ORDER — KETOROLAC TROMETHAMINE 60 MG/2ML IM SOLN
60.0000 mg | Freq: Once | INTRAMUSCULAR | Status: AC
  Administered 2011-08-16: 60 mg via INTRAMUSCULAR
  Filled 2011-08-16: qty 2

## 2011-08-16 MED ORDER — OXYCODONE-ACETAMINOPHEN 5-325 MG PO TABS
2.0000 | ORAL_TABLET | Freq: Once | ORAL | Status: AC
  Administered 2011-08-16: 2 via ORAL
  Filled 2011-08-16: qty 2

## 2011-08-16 NOTE — ED Provider Notes (Signed)
History     CSN: [id number]  Arrival date & time 08/16/11  1359   First MD Initiated Contact with Patient 08/16/11 1408      Chief Complaint  Patient presents with  . Back Pain    (Consider location/radiation/quality/duration/timing/severity/associated sxs/prior treatment) HPI Comments: History of back pain.  Had mri two years ago, did not require surgery.    Patient is a 46 y.o. female presenting with back pain. The history is provided by the patient.  Back Pain  This is a recurrent problem. The current episode started 2 days ago. The problem has been gradually worsening. The pain is associated with no known injury. The pain is present in the lumbar spine. The quality of the pain is described as shooting. The pain radiates to the left thigh. The pain is severe. The symptoms are aggravated by bending, twisting and certain positions. The pain is the same all the time. Associated symptoms include bladder incontinence. Pertinent negatives include no chest pain, no fever, no numbness, no bowel incontinence, no tingling and no weakness. She has tried NSAIDs for the symptoms. The treatment provided no relief.    Past Medical History  Diagnosis Date  . GERD (gastroesophageal reflux disease)   . Cancer   . Vocal cord cancer     Past Surgical History  Procedure Date  . Carpal tunnel release   . Nose surgery   . Tonsillectomy   . Cleft palate repair     No family history on file.  History  Substance Use Topics  . Smoking status: Current Everyday Smoker -- 0.5 packs/day  . Smokeless tobacco: Not on file  . Alcohol Use: Yes     occasional    OB History    Grav Para Term Preterm Abortions TAB SAB Ect Mult Living                  Review of Systems  Constitutional: Negative for fever.  Cardiovascular: Negative for chest pain.  Gastrointestinal: Negative for bowel incontinence.  Genitourinary: Positive for bladder incontinence.  Musculoskeletal: Positive for back pain.    Neurological: Negative for tingling, weakness and numbness.  All other systems reviewed and are negative.    Allergies  Bee venom  Home Medications   Current Outpatient Rx  Name Route Sig Dispense Refill  . MEGESTROL ACETATE 20 MG PO TABS Oral Take 20 mg by mouth 2 (two) times daily.    . BC HEADACHE POWDER PO Oral Take 1 packet by mouth as needed. For occasional pain     . CETIRIZINE-PSEUDOEPHEDRINE ER 5-120 MG PO TB12 Oral Take 1 tablet by mouth daily as needed. For allergies     . ESOMEPRAZOLE MAGNESIUM 40 MG PO CPDR Oral Take 40 mg by mouth daily.      Marland Kitchen PROBIOTIC FORMULA PO Oral Take 1 tablet by mouth daily.      . TRAMADOL-ACETAMINOPHEN 37.5-325 MG PO TABS Oral Take 2 tablets by mouth 2 (two) times daily.        BP 143/85  Pulse 79  Temp(Src) 98 F (36.7 C) (Oral)  Resp 18  Ht 5' 4.5" (1.638 m)  Wt 245 lb (111.131 kg)  BMI 41.40 kg/m2  SpO2 100%  LMP 05/02/2011  Physical Exam  Nursing note and vitals reviewed. Constitutional: She is oriented to person, place, and time. She appears well-developed and well-nourished. No distress.  HENT:  Head: Normocephalic and atraumatic.  Neck: Normal range of motion. Neck supple.  Abdominal: Soft. Bowel sounds  are normal. She exhibits no distension. There is no tenderness.  Musculoskeletal: Normal range of motion.  Neurological: She is alert and oriented to person, place, and time.       DTR's are 1+ in the left knee, 2+ in the right.  Strength is 5/5 in the ble.  Walks on heels, toes without difficulty.  Skin: Skin is warm and dry. She is not diaphoretic.    ED Course  Procedures (including critical care time)  Labs Reviewed - No data to display No results found.   No diagnosis found.    MDM  The reflexes are slightly asymmetrical, however strength is equal and there are no bowel or bladder complaints.  I will prescribe prednisone, percocet, give time.  If she worsens or does not improve in the next week, she will  likely require an mri of the lumbar spine.          Geoffery Lyons, MD 08/16/11 [phone]

## 2011-08-16 NOTE — ED Notes (Signed)
Lower back pain for years but the past few days she has had radiation into her left leg. Ambulatory to triage. Boyfriend drove her here.

## 2011-11-28 ENCOUNTER — Other Ambulatory Visit: Payer: Self-pay | Admitting: Obstetrics and Gynecology

## 2011-12-18 ENCOUNTER — Encounter (HOSPITAL_COMMUNITY): Payer: Self-pay | Admitting: Pharmacy Technician

## 2011-12-19 NOTE — Patient Instructions (Addendum)
20 [name]  12/19/2011   Your procedure is scheduled on:   12/27/2011   Report to [facility] at  615  AM.  Call this number if you have problems the morning of surgery: [phone]   Remember:   Do not eat food:After Midnight.  May have clear liquids:until Midnight .  Marland Kitchen  Take these medicines the morning of surgery with A SIP OF WATER:  nexium,zyrtec   Do not wear jewelry, make-up or nail polish.  Do not wear lotions, powders, or perfumes. You may wear deodorant.  Do not shave 48 hours prior to surgery. Men may shave face and neck.  Do not bring valuables to the hospital.  Contacts, dentures or bridgework may not be worn into surgery.  Leave suitcase in the car. After surgery it may be brought to your room.  For patients admitted to the hospital, checkout time is 11:00 AM the day of discharge.   Patients discharged the day of surgery will not be allowed to drive home.  Name and phone number of your driver: family  Special Instructions: CHG Shower Use Special Wash: 1/2 bottle night before surgery and 1/2 bottle morning of surgery.   Please read over the following fact sheets that you were given: Pain Booklet, MRSA Information, Surgical Site Infection Prevention, Anesthesia Post-op Instructions and Care and Recovery After Surgery Endometrial Ablation Endometrial ablation removes the lining of the uterus (endometrium). It is usually a same day, outpatient treatment. Ablation helps avoid major surgery (such as a hysterectomy). A hysterectomy is removal of the cervix and uterus. Endometrial ablation has less risk and complications, has a shorter recovery period and is less expensive. After endometrial ablation, most women will have little or no menstrual bleeding. You may not keep your fertility. Pregnancy is no longer likely after this procedure but if you are pre-menopausal, you still need to use a reliable method of birth control following the procedure because pregnancy can occur. REASONS  TO HAVE THE PROCEDURE MAY INCLUDE:  Heavy periods.   Bleeding that is causing anemia.   Anovulatory bleeding, very irregular, bleeding.   Bleeding submucous fibroids (on the lining inside the uterus) if they are smaller than 3 centimeters.  REASONS NOT TO HAVE THE PROCEDURE MAY INCLUDE:  You wish to have more children.   You have a pre-cancerous or cancerous problem. The cause of any abnormal bleeding must be diagnosed before having the procedure.   You have pain coming from the uterus.   You have a submucus fibroid larger than 3 centimeters.   You recently had a baby.   You recently had an infection in the uterus.   You have a severe retro-flexed, tipped uterus and cannot insert the instrument to do the ablation.   You had a Cesarean section or deep major surgery on the uterus.   The inner cavity of the uterus is too large for the endometrial ablation instrument.  RISKS AND COMPLICATIONS   Perforation of the uterus.   Bleeding.   Infection of the uterus, bladder or vagina.   Injury to surrounding organs.   Cutting the cervix.   An air bubble to the lung (air embolus).   Pregnancy following the procedure.   Failure of the procedure to help the problem requiring hysterectomy.   Decreased ability to diagnose cancer in the lining of the uterus.  BEFORE THE PROCEDURE  The lining of the uterus must be tested to make sure there is no pre-cancerous or cancer cells present.  Medications may be given to make the lining of the uterus thinner.   Ultrasound may be used to evaluate the size and look for abnormalities of the uterus.   Future pregnancy is not desired.  PROCEDURE  There are different ways to destroy the lining of the uterus.   Resectoscope - radio frequency-alternating electric current is the most common one used.   Cryotherapy - freezing the lining of the uterus.   Heated Free Liquid - heated salt (saline) solution inserted into the uterus.    Microwave - uses high energy microwaves in the uterus.   Thermal Balloon - a catheter with a balloon tip is inserted into the uterus and filled with heated fluid.  Your caregiver will talk with you about the method used in this clinic. They will also instruct you on the pros and cons of the procedure. Endometrial ablation is performed along with a procedure called operative hysteroscopy. A narrow viewing tube is inserted through the birth canal (vagina) and through the cervix into the uterus. A tiny camera attached to the viewing tube (hysteroscope) allows the uterine cavity to be shown on a TV monitor during surgery. Your uterus is filled with a harmless liquid to make the procedure easier. The lining of the uterus is then removed. The lining can also be removed with a resectoscope which allows your surgeon to cut away the lining of the uterus under direct vision. Usually, you will be able to go home within an hour after the procedure. HOME CARE INSTRUCTIONS   Do not drive for 24 hours.   No tampons, douching or intercourse for 2 weeks or until your caregiver approves.   Rest at home for 24 to 48 hours. You may then resume normal activities unless told differently by your caregiver.   Take your temperature two times a day for 4 days, and record it.   Take any medications your caregiver has ordered, as directed.   Use some form of contraception if you are pre-menopausal and do not want to get pregnant.  Bleeding after the procedure is normal. It varies from light spotting and mildly watery to bloody discharge for 4 to 6 weeks. You may also have mild cramping. Only take over-the-counter or prescription medicines for pain, discomfort, or fever as directed by your caregiver. Do not use aspirin, as this may aggravate bleeding. Frequent urination during the first 24 hours is normal. You will not know how effective your surgery is until at least 3 months after the surgery. SEEK IMMEDIATE MEDICAL CARE  IF:   Bleeding is heavier than a normal menstrual cycle.   An oral temperature above 102 F (38.9 C) develops.   You have increasing cramps or pains not relieved with medication or develop belly (abdominal) pain which does not seem to be related to the same area of earlier cramping and pain.   You are light headed, weak or have fainting episodes.   You develop pain in the shoulder strap areas.   You have chest or leg pain.   You have abnormal vaginal discharge.   You have painful urination.  Document Released: 05/24/2004 Document Revised: 07/04/2011 Document Reviewed: 08/22/2007 [facility] Patient Information 2012 [place], [place].Hysteroscopy Hysteroscopy is a procedure used for looking inside the womb (uterus). It may be done for many different reasons, including:  To evaluate abnormal bleeding, fibroid (benign, noncancerous) tumors, polyps, scar tissue (adhesions), and possibly cancer of the uterus.   To look for lumps (tumors) and other uterine growths.  To look for causes of why a woman cannot get pregnant (infertility), causes of recurrent loss of pregnancy (miscarriages), or a lost intrauterine device (IUD).   To perform a sterilization by blocking the fallopian tubes from inside the uterus.  A hysteroscopy should be done right after a menstrual period to be sure you are not pregnant. LET YOUR CAREGIVER KNOW ABOUT:   Allergies.   Medicines taken, including herbs, eyedrops, over-the-counter medicines, and creams.   Use of steroids (by mouth or creams).   Previous problems with anesthetics or numbing medicines.   History of bleeding or blood problems.   History of blood clots.   Possibility of pregnancy, if this applies.   Previous surgery.   Other health problems.  RISKS AND COMPLICATIONS   Putting a hole in the uterus.   Excessive bleeding.   Infection.   Damage to the cervix.   Injury to other organs.   Allergic reaction to medicines.   Too much  fluid used in the uterus for the procedure.  BEFORE THE PROCEDURE   Do not take aspirin or blood thinners for a week before the procedure, or as directed. It can cause bleeding.   Arrive at least 60 minutes before the procedure or as directed to read and sign the necessary forms.   Arrange for someone to take you home after the procedure.   If you smoke, do not smoke for 2 weeks before the procedure.  PROCEDURE   Your caregiver may give you medicine to relax you. He or she may also give you a medicine that numbs the area around the cervix (local anesthetic) or a medicine that makes you sleep (general anesthesia).   Sometimes, a medicine is placed in the cervix the day before the procedure. This medicine makes the cervix have a larger opening (dilate). This makes it easier for the instrument to be inserted into the uterus.   A small instrument (hysteroscope) is inserted through the vagina into the uterus. This instrument is similar to a pencil-sized telescope with a light.   During the procedure, air or a liquid is put into the uterus, which allows the surgeon to see better.   Sometimes, tissue is gently scraped from inside the uterus. These tissue samples are sent to a specialist who looks at tissue samples (pathologist). The pathologist will give a report to your caregiver. This will help your caregiver decide if further treatment is necessary. The report will also help your caregiver decide on the best treatment if the test comes back abnormal.  AFTER THE PROCEDURE   If you had a general anesthetic, you may be groggy for a couple hours after the procedure.   If you had a local anesthetic, you will be advised to rest at the surgical center or caregiver's office until you are stable and feel ready to go home.   You may have some cramping for a couple days.   You may have bleeding, which varies from light spotting for a few days to menstrual-like bleeding for up to 3 to 7 days. This is  normal.   Have someone take you home.  FINDING OUT THE RESULTS OF YOUR TEST Not all test results are available during your visit. If your test results are not back during the visit, make an appointment with your caregiver to find out the results. Do not assume everything is normal if you have not heard from your caregiver or the medical facility. It is important for you to follow up  on all of your test results. HOME CARE INSTRUCTIONS   Do not drive for 24 hours or as instructed.   Only take over-the-counter or prescription medicines for pain, discomfort, or fever as directed by your caregiver.   Do not take aspirin. It can cause or aggravate bleeding.   Do not drive or drink alcohol while taking pain medicine.   You may resume your usual diet.   Do not use tampons, douche, or have sexual intercourse for 2 weeks, or as advised by your caregiver.   Rest and sleep for the first 24 to 48 hours.   Take your temperature twice a day for 4 to 5 days. Write it down. Give these temperatures to your caregiver if they are abnormal (above 98.6 F or 37.0 C).   Take medicines your caregiver has ordered as directed.   Follow your caregiver's advice regarding diet, exercise, lifting, driving, and general activities.   Take showers instead of baths for 2 weeks, or as recommended by your caregiver.   If you develop constipation:   Take a mild laxative with the advice of your caregiver.   Eat bran foods.   Drink enough water and fluids to keep your urine clear or pale yellow.   Try to have someone with you or available to you for the first 24 to 48 hours, especially if you had a general anesthetic.   Make sure you and your family understand everything about your operation and recovery.   Follow your caregiver's advice regarding follow-up appointments and Pap smears.  SEEK MEDICAL CARE IF:   You feel dizzy or lightheaded.   You feel sick to your stomach (nauseous).   You develop abnormal  vaginal discharge.   You develop a rash.   You have an abnormal reaction or allergy to your medicine.   You need stronger pain medicine.  SEEK IMMEDIATE MEDICAL CARE IF:   Bleeding is heavier than a normal menstrual period or you have blood clots.   You have an oral temperature above 102 F (38.9 C), not controlled by medicine.   You have increasing cramps or pains not relieved with medicine.   You develop belly (abdominal) pain that does not seem to be related to the same area of earlier cramping and pain.   You pass out.   You develop pain in the tops of your shoulders (shoulder strap areas).   You develop shortness of breath.  MAKE SURE YOU:   Understand these instructions.   Will watch your condition.   Will get help right away if you are not doing well or get worse.  Document Released: 10/21/2000 Document Revised: 07/04/2011 Document Reviewed: 02/13/2009 [facility] Patient Information 2012 [place], [place].Dilation and Curettage or Vacuum Curettage Dilation and curettage (D&C) and vacuum curettage are minor procedures. A D&C involves stretching (dilation) the cervix and scraping (curettage) the inside lining of the womb (uterus). During a D&C, tissue is gently scraped from the inside lining of the uterus. During a vacuum curettage, the lining and tissue in the uterus are removed with the use of gentle suction. Curettage may be performed for diagnostic or therapeutic purposes. As a diagnostic procedure, curettage is performed for the purpose of examining tissues from the uterus. Tissue examination may help determine causes or treatment options for symptoms. A diagnostic curettage may be performed for the following symptoms:  Irregular bleeding in the uterus.   Bleeding with the development of clots.   Spotting between menstrual periods.   Prolonged menstrual periods.  Bleeding after menopause.   No menstrual period (amenorrhea).   A change in size and shape of the  uterus.  A therapeutic curettage is performed to remove tissue, blood, or a contraceptive device. Therapeutic curettage may be performed for the following conditions:   Removal of an IUD (intrauterine device).   Removal of retained placenta after giving birth. Retained placenta can cause bleeding severe enough to require transfusions or an infection.   Abortion.   Miscarriage.   Removal of polyps inside the uterus.   Removal of uncommon types of fibroids (noncancerous lumps).  LET YOUR CAREGIVER KNOW ABOUT:   Allergies to food or medicine.   Medicines taken, including vitamins, herbs, eyedrops, over-the-counter medicines, and creams.   Use of steroids (by mouth or creams).   Previous problems with anesthetics or numbing medicines.   History of bleeding problems or blood clots.   Previous surgery.   Other health problems, including diabetes and kidney problems.   Possibility of pregnancy, if this applies.  RISKS AND COMPLICATIONS   Excessive bleeding.   Infection of the uterus.   Damage to the cervix.   Development of scar tissue (adhesions) inside the uterus, later causing abnormal amounts of menstrual bleeding.   Complications from the general anesthetic, if a general anesthetic is used.   Putting a hole (perforation) in the uterus. This is rare.  BEFORE THE PROCEDURE   Eat and drink before the procedure only as directed by your caregiver.   Arrange for someone to take you home.  PROCEDURE   This procedure may be done in a hospital, outpatient clinic, or caregiver's office.   You may be given a general anesthetic or a local anesthetic in and around the cervix.   You will lie on your back with your legs in stirrups.   There are two ways in which your cervix can be softened and dilated. These include:   Taking a medicine.   Having thin rods (laminaria) inserted into your cervix.   A curved tool (curette) will scrape cells from the inside lining of the  uterus and will then be removed.  This procedure usually takes about 15 to 30 minutes. AFTER THE PROCEDURE   You will rest in the recovery area until you are stable and are ready to go home.   You will need to have someone take you home.   You may feel sick to your stomach (nauseous) or throw up (vomit) if you had general anesthesia.   You may have a sore throat if a tube was placed in your throat during general anesthesia.   You may have light cramping and bleeding for 2 days to 2 weeks after the procedure.   Your uterus needs to make a new lining after the procedure. This may make your next period late.  Document Released: 07/15/2005 Document Revised: 07/04/2011 Document Reviewed: 02/10/2009 [facility] Patient Information 2012 [place], [place].PATIENT INSTRUCTIONS POST-ANESTHESIA  IMMEDIATELY FOLLOWING SURGERY:  Do not drive or operate machinery for the first twenty four hours after surgery.  Do not make any important decisions for twenty four hours after surgery or while taking narcotic pain medications or sedatives.  If you develop intractable nausea and vomiting or a severe headache please notify your doctor immediately.  FOLLOW-UP:  Please make an appointment with your surgeon as instructed. You do not need to follow up with anesthesia unless specifically instructed to do so.  WOUND CARE INSTRUCTIONS (if applicable):  Keep a dry clean dressing on the anesthesia/puncture wound  site if there is drainage.  Once the wound has quit draining you may leave it open to air.  Generally you should leave the bandage intact for twenty four hours unless there is drainage.  If the epidural site drains for more than 36-48 hours please call the anesthesia department.  QUESTIONS?:  Please feel free to call your physician or the hospital operator if you have any questions, and they will be happy to assist you.

## 2011-12-20 ENCOUNTER — Other Ambulatory Visit: Payer: Self-pay | Admitting: Obstetrics and Gynecology

## 2011-12-20 ENCOUNTER — Encounter (HOSPITAL_COMMUNITY): Payer: Self-pay

## 2011-12-20 ENCOUNTER — Encounter: Payer: Self-pay | Admitting: Obstetrics and Gynecology

## 2011-12-20 ENCOUNTER — Encounter (HOSPITAL_COMMUNITY)
Admission: RE | Admit: 2011-12-20 | Discharge: 2011-12-20 | Disposition: A | Discharge status: Home / Self Care | Payer: Medicaid Other | Source: Ambulatory Visit | Attending: Obstetrics and Gynecology | Admitting: Obstetrics and Gynecology

## 2011-12-20 HISTORY — DX: Unspecified osteoarthritis, unspecified site: M19.90

## 2011-12-20 LAB — URINE MICROSCOPIC-ADD ON

## 2011-12-20 LAB — CBC
HCT: 44.8 % (ref 36.0–46.0)
Hemoglobin: 14.9 g/dL (ref 12.0–15.0)
MCV: 94.9 fL (ref 78.0–100.0)
RDW: 12.4 % (ref 11.5–15.5)
WBC: 9.5 10*3/uL (ref 4.0–10.5)

## 2011-12-20 LAB — URINALYSIS, ROUTINE W REFLEX MICROSCOPIC
Glucose, UA: NEGATIVE mg/dL
Ketones, ur: NEGATIVE mg/dL
Nitrite: NEGATIVE
Specific Gravity, Urine: 1.005 (ref 1.005–1.030)
pH: 7 (ref 5.0–8.0)

## 2011-12-20 NOTE — H&P (Signed)
[name] is an 46 y.o. female. She is seen in day surgery for hysteroscopy D&C and endometrial ablation to address her complaints of heavy irregular menstrual bleeding of over 2 years duration. Normal Pap smears and exams are the health department except for a single abnormal Pap in 2010 with normal Pap smears subsequently. His head and evaluation including ultrasound which only revealed a normal uterine cavity, a simple 3 cm right adnexal cyst which is persistent but not enlarging, considered benign, asymptomatic. She has reviewed and [facility] endometrial ablation information and desires to proceed with endometrial ablation. Prior tubal ligation has been performed. Endometrial biopsy has been performed which was proliferative phase endometrium only Technical aspects of the procedure been reviewed with the patient as well as biopsy report results.  Pertinent Gynecological History: Menses: Irregular x2 years previously normal Bleeding: dysfunctional uterine bleeding Contraception: tubal ligation DES exposure: unknown Blood transfusions: none Sexually transmitted diseases: no past history Previous GYN Procedures: Tubal ligation  Last mammogram: Unknown, followed for health department Date:  Last pap: normal Date: 2012 OB History: G1, P1   Menstrual History: Menarche age: 70 No LMP recorded. LMP 11/20/2011    Past Medical History  Diagnosis Date  . GERD (gastroesophageal reflux disease)   . Cancer   . Vocal cord cancer   . IBS (irritable bowel syndrome)     Past Surgical History  Procedure Date  . Carpal tunnel release   . Nose surgery   . Tonsillectomy   . Cleft palate repair   . Laparoscopic tubal ligation     [facility]    No family history on file.  Social History:  reports that she has been smoking Cigarettes.  She has been smoking about 1 pack per day. She does not have any smokeless tobacco history on file. She reports that she drinks about one  ounce of alcohol per week. She reports that she does not use illicit drugs.  Allergies:  Allergies  Allergen Reactions  . Bee Venom Anaphylaxis    Epi pen required     (Not in a hospital admission)  Review of Systems  Constitutional: Negative.   Respiratory: Negative.   Cardiovascular: Negative.     There were no vitals taken for this visit. Physical Exam  Constitutional: She is oriented to person, place, and time. She appears well-developed and well-nourished.       Obese, ht 5'4 1/2" wt 250  HENT:  Head: Normocephalic.  Eyes: Pupils are equal, round, and reactive to light.  Neck: Neck supple. No thyromegaly present.  Cardiovascular: Normal rate and regular rhythm.   Respiratory: Effort normal and breath sounds normal.  GI: Soft.  Genitourinary: Vagina normal.       Uterus anterior normal size shape and contour endometrial biopsy shows proliferative endometrium without hyperplasia or malignancy. Ultrasound reveals a persistent simple 3 cm right adnexal cyst with uterus anteverted 7.7 x 3.8 x 4.0 cm with a 3.1 mm thin endometrium on ultrasound Ovaries are otherwise normal  Musculoskeletal: Normal range of motion.  Neurological: She is alert and oriented to person, place, and time.  Skin: Skin is warm.  Psychiatric: She has a normal mood and affect. Her behavior is normal. Judgment and thought content normal.    No results found for this or any previous visit (from the past 24 hour(s)).  No results found.  Assessment/Plan: Persistent dysfunctional uterine bleeding, it considered excessively heavy by patient, requesting endometrial ablation [facility] 3 endometrial ablation planned 12/27/2011  [name] 12/20/2011, 7:49 AM

## 2011-12-27 ENCOUNTER — Encounter (HOSPITAL_COMMUNITY): Payer: Self-pay | Admitting: Anesthesiology

## 2011-12-27 ENCOUNTER — Encounter (HOSPITAL_COMMUNITY)
Admission: RE | Disposition: A | Discharge status: Home / Self Care | Payer: Self-pay | Source: Ambulatory Visit | Attending: Obstetrics and Gynecology

## 2011-12-27 ENCOUNTER — Ambulatory Visit (HOSPITAL_COMMUNITY)
Admission: RE | Admit: 2011-12-27 | Discharge: 2011-12-27 | Disposition: A | Discharge status: Home / Self Care | Payer: Medicaid Other | Source: Ambulatory Visit | Attending: Obstetrics and Gynecology | Admitting: Obstetrics and Gynecology

## 2011-12-27 ENCOUNTER — Encounter (HOSPITAL_COMMUNITY): Payer: Self-pay | Admitting: *Deleted

## 2011-12-27 ENCOUNTER — Ambulatory Visit (HOSPITAL_COMMUNITY): Payer: Medicaid Other | Admitting: Anesthesiology

## 2011-12-27 DIAGNOSIS — N949 Unspecified condition associated with female genital organs and menstrual cycle: Secondary | ICD-10-CM | POA: Insufficient documentation

## 2011-12-27 DIAGNOSIS — N938 Other specified abnormal uterine and vaginal bleeding: Secondary | ICD-10-CM | POA: Insufficient documentation

## 2011-12-27 SURGERY — DILATATION & CURETTAGE/HYSTEROSCOPY WITH THERMACHOICE ABLATION
Anesthesia: General | Site: Uterus | Wound class: Clean Contaminated

## 2011-12-27 MED ORDER — FENTANYL CITRATE 0.05 MG/ML IJ SOLN
INTRAMUSCULAR | Status: DC | PRN
  Administered 2011-12-27: 25 ug via INTRAVENOUS
  Administered 2011-12-27: 50 ug via INTRAVENOUS
  Administered 2011-12-27: 25 ug via INTRAVENOUS

## 2011-12-27 MED ORDER — PROPOFOL 10 MG/ML IV BOLUS
INTRAVENOUS | Status: DC | PRN
  Administered 2011-12-27: 160 mg via INTRAVENOUS

## 2011-12-27 MED ORDER — FENTANYL CITRATE 0.05 MG/ML IJ SOLN
25.0000 ug | INTRAMUSCULAR | Status: DC | PRN
  Administered 2011-12-27: 25 ug via INTRAVENOUS
  Administered 2011-12-27: 50 ug via INTRAVENOUS
  Administered 2011-12-27: 25 ug via INTRAVENOUS

## 2011-12-27 MED ORDER — ACETAMINOPHEN 325 MG PO TABS: 325.0000 mg | ORAL_TABLET | ORAL | Status: DC | PRN

## 2011-12-27 MED ORDER — FENTANYL CITRATE 0.05 MG/ML IJ SOLN
INTRAMUSCULAR | Status: AC
  Administered 2011-12-27: 25 ug via INTRAVENOUS
  Filled 2011-12-27: qty 2

## 2011-12-27 MED ORDER — MIDAZOLAM HCL 5 MG/5ML IJ SOLN
INTRAMUSCULAR | Status: DC | PRN
  Administered 2011-12-27: 2 mg via INTRAVENOUS

## 2011-12-27 MED ORDER — LIDOCAINE HCL 1 % IJ SOLN
INTRAMUSCULAR | Status: DC | PRN
  Administered 2011-12-27: 30 mg via INTRADERMAL

## 2011-12-27 MED ORDER — BUPIVACAINE-EPINEPHRINE PF 0.5-1:200000 % IJ SOLN
INTRAMUSCULAR | Status: AC
  Filled 2011-12-27: qty 10

## 2011-12-27 MED ORDER — ONDANSETRON HCL 4 MG/2ML IJ SOLN
INTRAMUSCULAR | Status: AC
  Administered 2011-12-27: 4 mg via INTRAVENOUS
  Filled 2011-12-27: qty 2

## 2011-12-27 MED ORDER — MIDAZOLAM HCL 2 MG/2ML IJ SOLN
INTRAMUSCULAR | Status: AC
  Administered 2011-12-27: 2 mg via INTRAVENOUS
  Filled 2011-12-27: qty 2

## 2011-12-27 MED ORDER — PROPOFOL 10 MG/ML IV EMUL
INTRAVENOUS | Status: AC
  Filled 2011-12-27: qty 20

## 2011-12-27 MED ORDER — OXYCODONE-ACETAMINOPHEN 5-325 MG PO TABS: 1.0000 | ORAL_TABLET | ORAL | Status: DC | PRN

## 2011-12-27 MED ORDER — MIDAZOLAM HCL 2 MG/2ML IJ SOLN
1.0000 mg | INTRAMUSCULAR | Status: DC | PRN
  Administered 2011-12-27: 2 mg via INTRAVENOUS

## 2011-12-27 MED ORDER — MIDAZOLAM HCL 2 MG/2ML IJ SOLN
INTRAMUSCULAR | Status: AC
  Filled 2011-12-27: qty 2

## 2011-12-27 MED ORDER — GLYCOPYRROLATE 0.2 MG/ML IJ SOLN
0.2000 mg | Freq: Once | INTRAMUSCULAR | Status: AC
  Administered 2011-12-27: 0.2 mg via INTRAVENOUS

## 2011-12-27 MED ORDER — LIDOCAINE HCL (PF) 1 % IJ SOLN
INTRAMUSCULAR | Status: AC
  Filled 2011-12-27: qty 5

## 2011-12-27 MED ORDER — CEFAZOLIN SODIUM-DEXTROSE 2-3 GM-% IV SOLR: 2.0000 g | INTRAVENOUS | Status: DC

## 2011-12-27 MED ORDER — FENTANYL CITRATE 0.05 MG/ML IJ SOLN
INTRAMUSCULAR | Status: AC
  Administered 2011-12-27: 50 ug via INTRAVENOUS
  Filled 2011-12-27: qty 2

## 2011-12-27 MED ORDER — BUPIVACAINE-EPINEPHRINE 0.5% -1:200000 IJ SOLN
INTRAMUSCULAR | Status: DC | PRN
Start: 2011-12-27 — End: 2011-12-27
  Administered 2011-12-27: 20 mL

## 2011-12-27 MED ORDER — ONDANSETRON HCL 4 MG/2ML IJ SOLN: 4.0000 mg | Freq: Once | INTRAMUSCULAR | Status: DC | PRN

## 2011-12-27 MED ORDER — DEXTROSE 5 % IV SOLN
INTRAVENOUS | Status: DC | PRN
  Administered 2011-12-27: 500 mL via INTRAVENOUS

## 2011-12-27 MED ORDER — CEFAZOLIN SODIUM 1-5 GM-% IV SOLN
INTRAVENOUS | Status: DC | PRN
  Administered 2011-12-27: 2 g via INTRAVENOUS

## 2011-12-27 MED ORDER — KETOROLAC TROMETHAMINE 30 MG/ML IJ SOLN: 30.0000 mg | Freq: Once | INTRAMUSCULAR | Status: DC

## 2011-12-27 MED ORDER — SODIUM CHLORIDE 0.9 % IR SOLN
Status: DC | PRN
  Administered 2011-12-27: 3000 mL
  Administered 2011-12-27: 1000 mL

## 2011-12-27 MED ORDER — CEFAZOLIN SODIUM-DEXTROSE 2-3 GM-% IV SOLR
INTRAVENOUS | Status: AC
  Filled 2011-12-27: qty 50

## 2011-12-27 MED ORDER — ONDANSETRON HCL 4 MG/2ML IJ SOLN
4.0000 mg | Freq: Once | INTRAMUSCULAR | Status: AC
  Administered 2011-12-27: 4 mg via INTRAVENOUS

## 2011-12-27 MED ORDER — GLYCOPYRROLATE 0.2 MG/ML IJ SOLN
INTRAMUSCULAR | Status: AC
  Administered 2011-12-27: 0.2 mg via INTRAVENOUS
  Filled 2011-12-27: qty 1

## 2011-12-27 MED ORDER — LACTATED RINGERS IV SOLN
INTRAVENOUS | Status: DC
  Administered 2011-12-27: 08:00:00 via INTRAVENOUS

## 2011-12-27 SURGICAL SUPPLY — 28 items
BAG DECANTER FOR FLEXI CONT (MISCELLANEOUS) ×2 IMPLANT
BAG HAMPER (MISCELLANEOUS) ×2 IMPLANT
CATH ROBINSON RED A/P 16FR (CATHETERS) ×1 IMPLANT
CATH THERMACHOICE III (CATHETERS) ×2 IMPLANT
CLOTH BEACON ORANGE TIMEOUT ST (SAFETY) ×2 IMPLANT
COVER LIGHT HANDLE STERIS (MISCELLANEOUS) ×4 IMPLANT
FORMALIN 10 PREFIL 480ML (MISCELLANEOUS) ×1 IMPLANT
GLOVE BIOGEL PI IND STRL 7.0 (GLOVE) IMPLANT
GLOVE BIOGEL PI INDICATOR 7.0 (GLOVE) ×2
GLOVE ECLIPSE 9.0 STRL (GLOVE) ×2 IMPLANT
GLOVE EXAM NITRILE MD LF STRL (GLOVE) ×1 IMPLANT
GLOVE INDICATOR STER SZ 9 (GLOVE) ×2 IMPLANT
GLOVE OPTIFIT SS 6.5 STRL BRWN (GLOVE) ×1 IMPLANT
INST SET HYSTEROSCOPY (KITS) ×2 IMPLANT
IV D5W 500ML (IV SOLUTION) ×2 IMPLANT
IV NS IRRIG 3000ML ARTHROMATIC (IV SOLUTION) ×2 IMPLANT
KIT ROOM TURNOVER AP CYSTO (KITS) ×2 IMPLANT
MANIFOLD NEPTUNE II (INSTRUMENTS) ×2 IMPLANT
MANIFOLD NEPTUNE WASTE (CANNULA) ×2 IMPLANT
NDL SPNL 22GX3.5 QUINCKE BK (NEEDLE) IMPLANT
NEEDLE SPNL 22GX3.5 QUINCKE BK (NEEDLE) ×2 IMPLANT
NS IRRIG 1000ML POUR BTL (IV SOLUTION) ×2 IMPLANT
PACK PERI GYN (CUSTOM PROCEDURE TRAY) ×2 IMPLANT
PAD ARMBOARD 7.5X6 YLW CONV (MISCELLANEOUS) ×2 IMPLANT
PAD TELFA 3X4 1S STER (GAUZE/BANDAGES/DRESSINGS) ×2 IMPLANT
SET BASIN LINEN APH (SET/KITS/TRAYS/PACK) ×2 IMPLANT
SET IRRIG Y TYPE TUR BLADDER L (SET/KITS/TRAYS/PACK) ×2 IMPLANT
SYR CONTROL 10ML LL (SYRINGE) ×1 IMPLANT

## 2011-12-27 NOTE — Interval H&P Note (Signed)
History and Physical Interval Note:  12/27/2011 7:27 AM  [name]  has presented today for surgery, with the diagnosis of dysfunctional uterine bleeding  The various methods of treatment have been discussed with the patient and family. After consideration of risks, benefits and other options for treatment, the patient has consented to  Procedure(s) (LRB): DILATATION & CURETTAGE/HYSTEROSCOPY WITH THERMACHOICE ABLATION (N/A) as a surgical intervention .  The patients' history has been reviewed, patient examined, no change in status, stable for surgery.  I have reviewed the patients' chart and labs.  Questions were answered to the patient's satisfaction.    Patient had out of sequence menstrual bleeding begin yesterday.  Prior LMP early this month. Tilda Burrow

## 2011-12-27 NOTE — Anesthesia Procedure Notes (Signed)
Procedure Name: LMA Insertion Date/Time: 12/27/2011 7:48 AM Performed by: Despina Hidden Pre-anesthesia Checklist: Patient identified, Emergency Drugs available, Suction available and Patient being monitored Patient Re-evaluated:Patient Re-evaluated prior to inductionOxygen Delivery Method: Circle system utilized Preoxygenation: Pre-oxygenation with 100% oxygen Intubation Type: IV induction Ventilation: Mask ventilation without difficulty LMA Size: 3.0 Number of attempts: 1 Placement Confirmation: positive ETCO2 and breath sounds checked- equal and bilateral Tube secured with: Tape Dental Injury: Teeth and Oropharynx as per pre-operative assessment

## 2011-12-27 NOTE — Transfer of Care (Signed)
Immediate Anesthesia Transfer of Care Note  Patient: [name]  Procedure(s) Performed: Procedure(s) (LRB): DILATATION & CURETTAGE/HYSTEROSCOPY WITH THERMACHOICE ABLATION (N/A)  Patient Location: PACU  Anesthesia Type: General  Level of Consciousness: awake and patient cooperative  Airway & Oxygen Therapy: Patient Spontanous Breathing and Patient connected to face mask oxygen  Post-op Assessment: Report given to PACU RN, Post -op Vital signs reviewed and stable and Patient moving all extremities  Post vital signs: Reviewed and stable  Complications: No apparent anesthesia complications

## 2011-12-27 NOTE — H&P (View-Only) (Signed)
[name] is an 46 y.o. female. She is seen in day surgery for hysteroscopy D&C and endometrial ablation to address her complaints of heavy irregular menstrual bleeding of over 2 years duration. Normal Pap smears and exams are the health department except for a single abnormal Pap in 2010 with normal Pap smears subsequently. His head and evaluation including ultrasound which only revealed a normal uterine cavity, a simple 3 cm right adnexal cyst which is persistent but not enlarging, considered benign, asymptomatic. She has reviewed and Gynecare ThermaChoice endometrial ablation information and desires to proceed with endometrial ablation. Prior tubal ligation has been performed. Endometrial biopsy has been performed which was proliferative phase endometrium only Technical aspects of the procedure been reviewed with the patient as well as biopsy report results.  Pertinent Gynecological History: Menses: Irregular x2 years previously normal Bleeding: dysfunctional uterine bleeding Contraception: tubal ligation DES exposure: unknown Blood transfusions: none Sexually transmitted diseases: no past history Previous GYN Procedures: Tubal ligation  Last mammogram: Unknown, followed for health department Date:  Last pap: normal Date: 2012 OB History: G1, P1   Menstrual History: Menarche age: 12 No LMP recorded. LMP 11/20/2011    Past Medical History  Diagnosis Date  . GERD (gastroesophageal reflux disease)   . Cancer   . Vocal cord cancer   . IBS (irritable bowel syndrome)     Past Surgical History  Procedure Date  . Carpal tunnel release   . Nose surgery   . Tonsillectomy   . Cleft palate repair   . Laparoscopic tubal ligation     [facility]    No family history on file.  Social History:  reports that she has been smoking Cigarettes.  She has been smoking about 1 pack per day. She does not have any smokeless tobacco history on file. She reports that she drinks about one  ounce of alcohol per week. She reports that she does not use illicit drugs.  Allergies:  Allergies  Allergen Reactions  . Bee Venom Anaphylaxis    Epi pen required     (Not in a hospital admission)  Review of Systems  Constitutional: Negative.   Respiratory: Negative.   Cardiovascular: Negative.     There were no vitals taken for this visit. Physical Exam  Constitutional: She is oriented to person, place, and time. She appears well-developed and well-nourished.       Obese, ht 5'4 1/2" wt 250  HENT:  Head: Normocephalic.  Eyes: Pupils are equal, round, and reactive to light.  Neck: Neck supple. No thyromegaly present.  Cardiovascular: Normal rate and regular rhythm.   Respiratory: Effort normal and breath sounds normal.  GI: Soft.  Genitourinary: Vagina normal.       Uterus anterior normal size shape and contour endometrial biopsy shows proliferative endometrium without hyperplasia or malignancy. Ultrasound reveals a persistent simple 3 cm right adnexal cyst with uterus anteverted 7.7 x 3.8 x 4.0 cm with a 3.1 mm thin endometrium on ultrasound Ovaries are otherwise normal  Musculoskeletal: Normal range of motion.  Neurological: She is alert and oriented to person, place, and time.  Skin: Skin is warm.  Psychiatric: She has a normal mood and affect. Her behavior is normal. Judgment and thought content normal.    No results found for this or any previous visit (from the past 24 hour(s)).  No results found.  Assessment/Plan: Persistent dysfunctional uterine bleeding, it considered excessively heavy by patient, requesting endometrial ablation Gynecare ThermaChoice 3 endometrial ablation planned 12/27/2011    Esmirna Ravan V 12/20/2011, 7:49 AM  

## 2011-12-27 NOTE — Anesthesia Preprocedure Evaluation (Signed)
Anesthesia Evaluation  Patient identified by MRN, date of birth, ID band Patient awake    Reviewed: Allergy & Precautions, H&P , NPO status , Patient's Chart, lab work & pertinent test results  Airway Mallampati: I TM Distance: >3 FB Neck ROM: Full    Dental No notable dental hx.    Pulmonary    Pulmonary exam normal       Cardiovascular negative cardio ROS  Rhythm:Regular Rate:Normal     Neuro/Psych PSYCHIATRIC DISORDERS Depression    GI/Hepatic Neg liver ROS, GERD-  Medicated,  Endo/Other  Morbid obesity  Renal/GU negative Renal ROS     Musculoskeletal negative musculoskeletal ROS (+)   Abdominal (+) + obese,  Abdomen: soft.    Peds  Hematology negative hematology ROS (+)   Anesthesia Other Findings   Reproductive/Obstetrics negative OB ROS                           Anesthesia Physical Anesthesia Plan  ASA: II  Anesthesia Plan: General   Post-op Pain Management:    Induction: Intravenous  Airway Management Planned: LMA  Additional Equipment:   Intra-op Plan:   Post-operative Plan: Extubation in OR  Informed Consent: I have reviewed the patients History and Physical, chart, labs and discussed the procedure including the risks, benefits and alternatives for the proposed anesthesia with the patient or authorized representative who has indicated his/her understanding and acceptance.     Plan Discussed with: CRNA  Anesthesia Plan Comments:         Anesthesia Quick Evaluation

## 2011-12-27 NOTE — Anesthesia Postprocedure Evaluation (Signed)
  Anesthesia Post-op Note  Patient: [name]  Procedure(s) Performed: Procedure(s) (LRB): DILATATION & CURETTAGE/HYSTEROSCOPY WITH THERMACHOICE ABLATION (N/A)  Patient Location: PACU  Anesthesia Type: General  Level of Consciousness: awake, alert , oriented and patient cooperative  Airway and Oxygen Therapy: Patient Spontanous Breathing  Post-op Pain: none  Post-op Assessment: Post-op Vital signs reviewed, Patient's Cardiovascular Status Stable, Respiratory Function Stable, Patent Airway, No signs of Nausea or vomiting and Pain level controlled  Post-op Vital Signs: Reviewed and stable  Complications: No apparent anesthesia complications

## 2011-12-27 NOTE — Brief Op Note (Signed)
12/27/2011  8:30 AM  PATIENT:  [name]  46 y.o. female  PRE-OPERATIVE DIAGNOSIS:  dysfunctional uterine bleeding  POST-OPERATIVE DIAGNOSIS:  dysfunctional uterine bleeding  PROCEDURE:  Procedure(s) (LRB): DILATATION & CURETTAGE/HYSTEROSCOPY WITH THERMACHOICE ABLATION (N/A)  SURGEON:  Surgeon(s) and Role:    * Tilda Burrow, MD - Primary  PHYSICIAN ASSISTANT:   ASSISTANTS: none   ANESTHESIA:   local and general  EBL:  Total I/O In: 200 [I.V.:200] Out: -   BLOOD ADMINISTERED:none  DRAINS: none   LOCAL MEDICATIONS USED:  MARCAINE    and Amount: 20 ml  SPECIMEN:  No Specimen  DISPOSITION OF SPECIMEN:  N/A  COUNTS:  YES  TOURNIQUET:  * No tourniquets in log *  DICTATION: .Dragon Dictation Patient was taken operating room prepped and draped for vaginal procedure with patient in low lithotomy  leg support, with vaginal prepping performed. Cervix was grasped with single-tooth tenaculum sounded in the anteflexed position to 7-1/2 cm, dilated to 23 [place] underlying introduction of the 30 rigid hysteroscope without difficulty identifying a shaggy endometrial cavity. Curettage had been performed briefly just before the hysteroscopy, tiny minimal tissue fragments obtained. The Gynecare ThermaChoice 3 endometrial ablation device was prepared inserted and activated under normal pressures. At 1 minute into the procedure it the activated do to "increased temperatures. Fluid was recovered and all 10 cc was accounted for and activation sequence reinitiated with the same device. The Gynecare ThermaChoice 3 endometrial ablation device was then inserted 6 cm into the endometrial cavity, activated at 155 mm mercury pressure, and this time the 8 minute sequence was completed without difficulty with the remaining between 87 and 89C throughout the procedure. Patient tolerated procedure well. Paracervical block with Marcaine 20 cc was inserted with spinal needle.. Patient was allowed to go  recovery room in good condition sponge and needle counts correct. PLAN OF CARE: Discharge to home after PACU  PATIENT DISPOSITION:  PACU - hemodynamically stable.   Delay start of Pharmacological VTE agent (>24hrs) due to surgical blood loss or risk of bleeding: not applicable

## 2011-12-27 NOTE — Op Note (Signed)
See operative note details included in brief op note 

## 2011-12-27 NOTE — Addendum Note (Signed)
Addendum  created 12/27/11 1237 by Franco Nones, CRNA   Modules edited:Charges VN

## 2012-01-06 ENCOUNTER — Emergency Department (HOSPITAL_BASED_OUTPATIENT_CLINIC_OR_DEPARTMENT_OTHER)
Admission: EM | Admit: 2012-01-06 | Discharge: 2012-01-06 | Disposition: A | Discharge status: Home / Self Care | Payer: Medicaid Other | Attending: Emergency Medicine | Admitting: Emergency Medicine

## 2012-01-06 ENCOUNTER — Emergency Department (HOSPITAL_BASED_OUTPATIENT_CLINIC_OR_DEPARTMENT_OTHER): Payer: Medicaid Other

## 2012-01-06 ENCOUNTER — Encounter (HOSPITAL_BASED_OUTPATIENT_CLINIC_OR_DEPARTMENT_OTHER): Payer: Self-pay | Admitting: *Deleted

## 2012-01-06 DIAGNOSIS — Z8521 Personal history of malignant neoplasm of larynx: Secondary | ICD-10-CM | POA: Insufficient documentation

## 2012-01-06 DIAGNOSIS — M79673 Pain in unspecified foot: Secondary | ICD-10-CM

## 2012-01-06 DIAGNOSIS — F17200 Nicotine dependence, unspecified, uncomplicated: Secondary | ICD-10-CM | POA: Insufficient documentation

## 2012-01-06 DIAGNOSIS — M19079 Primary osteoarthritis, unspecified ankle and foot: Secondary | ICD-10-CM | POA: Insufficient documentation

## 2012-01-06 DIAGNOSIS — Z91038 Other insect allergy status: Secondary | ICD-10-CM | POA: Insufficient documentation

## 2012-01-06 DIAGNOSIS — K589 Irritable bowel syndrome without diarrhea: Secondary | ICD-10-CM | POA: Insufficient documentation

## 2012-01-06 DIAGNOSIS — M79609 Pain in unspecified limb: Secondary | ICD-10-CM | POA: Insufficient documentation

## 2012-01-06 DIAGNOSIS — K219 Gastro-esophageal reflux disease without esophagitis: Secondary | ICD-10-CM | POA: Insufficient documentation

## 2012-01-06 DIAGNOSIS — M7730 Calcaneal spur, unspecified foot: Secondary | ICD-10-CM | POA: Insufficient documentation

## 2012-01-06 MED ORDER — HYDROCODONE-ACETAMINOPHEN 5-325 MG PO TABS
1.0000 | ORAL_TABLET | Freq: Once | ORAL | Status: AC
  Administered 2012-01-06: 1 via ORAL
  Filled 2012-01-06: qty 1

## 2012-01-06 MED ORDER — HYDROCODONE-ACETAMINOPHEN 5-325 MG PO TABS: ORAL_TABLET | ORAL | Status: AC

## 2012-01-06 MED ORDER — MELOXICAM 7.5 MG PO TABS: 7.5000 mg | ORAL_TABLET | Freq: Every day | ORAL | Status: DC

## 2012-01-06 NOTE — ED Provider Notes (Signed)
Medical screening examination/treatment/procedure(s) were performed by non-physician practitioner and as supervising physician I was immediately available for consultation/collaboration.   Michelyn Scullin Y. Kamilla Hands, MD 01/06/12 2347 

## 2012-01-06 NOTE — ED Provider Notes (Signed)
History     CSN: [id number]  Arrival date & time 01/06/12  1700   First MD Initiated Contact with Patient 01/06/12 1801      Chief Complaint  Patient presents with  . Foot Pain    (Consider location/radiation/quality/duration/timing/severity/associated sxs/prior treatment) HPI Comments: Patient presents with pain in her left foot for greater than one week. Patient denies injury at onset. Pain is dull and throbbing. It is worse with walking but she is able to walk. She states that the pain makes her toes feel like they're tingling. She denies knee or hip pain. Patient has history of chronic back pain. She's not had fevers, nausea or vomiting. The pain is not related to the shooting pains in her legs. She has been taking ibuprofen without any relief. Onset was gradual. Course is constant. Nothing makes the pain better.  Patient is a 46 y.o. female presenting with lower extremity pain.  Foot Pain Associated symptoms include arthralgias. Pertinent negatives include no joint swelling, neck pain, numbness or weakness.    Past Medical History  Diagnosis Date  . IBS (irritable bowel syndrome)   . GERD (gastroesophageal reflux disease)     IBS  . Cancer   . Vocal cord cancer   . Arthritis     disk disease    Past Surgical History  Procedure Date  . Carpal tunnel release   . Nose surgery   . Tonsillectomy   . Cleft palate repair   . Laparoscopic tubal ligation     [facility]  . Throat surgery 2000    for throat cancer/[facility]    No family history on file.  History  Substance Use Topics  . Smoking status: Current Everyday Smoker -- 1.0 packs/day for 26 years    Types: Cigarettes  . Smokeless tobacco: Not on file  . Alcohol Use: 1.0 oz/week    2 drink(s) per week     occasional    OB History    Grav Para Term Preterm Abortions TAB SAB Ect Mult Living   1 1        1       Review of Systems  Constitutional: Negative for activity  change.  HENT: Negative for neck pain.   Musculoskeletal: Positive for back pain and arthralgias. Negative for joint swelling.  Skin: Negative for wound.  Neurological: Negative for weakness and numbness.    Allergies  Bee venom  Home Medications   Current Outpatient Rx  Name Route Sig Dispense Refill  . BC HEADACHE POWDER PO Oral Take 1 packet by mouth 2 (two) times daily as needed. For occasional pain    . ESOMEPRAZOLE MAGNESIUM 40 MG PO CPDR Oral Take 40 mg by mouth every morning.     . IBUPROFEN 200 MG PO TABS Oral Take 400-800 mg by mouth every 6 (six) hours as needed. For pain    . EPINEPHRINE 0.3 MG/0.3ML IJ DEVI Intramuscular Inject 0.3 mg into the muscle once as needed. For bee venom      BP 126/81  Pulse 62  Temp(Src) 97.8 F (36.6 C) (Oral)  Resp 20  SpO2 99%  LMP 12/27/2011  Physical Exam  Nursing note and vitals reviewed. Constitutional: She appears well-developed and well-nourished.  HENT:  Head: Normocephalic and atraumatic.  Eyes: Pupils are equal, round, and reactive to light.  Neck: Normal range of motion. Neck supple.  Cardiovascular: Exam reveals no decreased pulses.   Pulses:      Dorsalis pedis pulses are  2+ on the right side, and 2+ on the left side.       Posterior tibial pulses are 2+ on the right side, and 2+ on the left side.  Musculoskeletal: She exhibits tenderness. She exhibits no edema.       Left knee: Normal.       Left ankle: Normal.       Left lower leg: Normal.       Left foot: She exhibits tenderness. She exhibits normal range of motion.       Feet:  Neurological: She is alert. No sensory deficit.       Motor, sensation, and vascular distal to the injury is fully intact.   Skin: Skin is warm and dry.  Psychiatric: She has a normal mood and affect.    ED Course  Procedures (including critical care time)  Labs Reviewed - No data to display Dg Foot Complete Left  01/06/2012  *RADIOLOGY REPORT*  Clinical Data: Left foot pain.   LEFT FOOT - COMPLETE 3+ VIEW  Comparison: None.  Findings: There is a slight hallux valgus deformity of the first toe with bunion formation.  There is slight spurring at the medial and lateral malleoli of the ankle.  There is also a small plantar calcaneal spur as well as slight dorsal spurring at the tarsometatarsal joints.  No acute osseous abnormality.  IMPRESSION: Arthritic changes primarily at the tarsometatarsal joints.  Original Report Authenticated By: Gwynn Burly, M.D.     1. Foot pain     6:41 PM Patient seen and examined.   Vital signs reviewed and are as follows: Filed Vitals:   01/06/12 1714  BP: 126/81  Pulse: 62  Temp: 97.8 F (36.6 C)  Resp: 20   Counseled patient on rice protocol. Informed of x-ray results. Will give orthopedic referral. Patient is to followup if she does not have improvement in one to 2 weeks. Patient informed to stop ibuprofen if taking prescription meloxicam. Patient verbalizes understanding and agrees with plan.   MDM  Foot pain, no acute injury. Pain is located in area of noted arthritis on x-ray. Will treat conservatively with NSAIDs and rest. Ortho referral given.          Renne Crigler, [place] 01/06/12 2302

## 2012-01-06 NOTE — Discharge Instructions (Signed)
Please read and follow all provided instructions.  Your diagnoses today include:  1. Foot pain     Tests performed today include:  X-ray showing arthritis  Vital signs. See below for your results today.   Medications prescribed:   Vicodin (hydrocodone/acetaminophen) - narcotic pain medication  You have been prescribed narcotic pain medication such as Vicodin or Percocet: DO NOT drive or perform any activities that require you to be awake and alert because this medicine can make you drowsy. BE VERY CAREFUL not to take multiple medicines containing Tylenol (also called acetaminophen). Doing so can lead to an overdose which can damage your liver and cause liver failure and possibly death.    Mobic - anti-inflammatory pain medication  You have been prescribed an anti-inflammatory medication or NSAID. Take with food. Take smallest effective dose for the shortest duration needed for your pain. Stop taking if you experience stomach pain or vomiting.    Doxycycline - antibiotic that kills skin bacteria  Keflex - antibiotic that kills skin bacteria  Take any prescribed medications only as directed.  Home care instructions:  Follow any educational materials contained in this packet.  BE VERY CAREFUL not to take multiple medicines containing Tylenol (also called acetaminophen). Doing so can lead to an overdose which can damage your liver and cause liver failure and possibly death.   Follow-up instructions: Please follow-up with your primary care provider in the next 1 week for further evaluation of your symptoms. If you do not have a primary care doctor -- see below for referral information.   Return instructions:   Please return to the Emergency Department if you experience worsening symptoms.   Please return if you have any other emergent concerns.  Additional Information:  Your vital signs today were: BP 126/81  Pulse 62  Temp(Src) 97.8 F (36.6 C) (Oral)  Resp 20  SpO2  99%  LMP 12/27/2011 If your blood pressure (BP) was elevated above 135/85 this visit, please have this repeated by your doctor within one month. -------------- No Primary Care Doctor Call [facility]  [phone] Other agencies that provide inexpensive medical care    [facility]  [phone]    [facility]  [phone]    [facility]  [phone]    [facility]  [phone]    [facility]  [phone]    [facility]  [phone] -------------- RESOURCE GUIDE:  Dental Problems  Patients with Medicaid: [facility] [phone] [address]                                            [phone] [address] Phone:  [phone]                                                   Phone:  [phone]  If unable to pay or uninsured, contact:  [facility] or [facility]. to become qualified for the adult dental clinic.  Chronic Pain Problems Contact [facility]  [phone] Patients need to be referred by their primary care doctor.  Insufficient Money for Medicine Contact [employer]:  call "211" or [facility] [phone].  Psychological Services [facility]  [phone] [facility]  [phone] [facility]   [phone] (emergency services [phone])  Substance Abuse Resources [facility]  [phone] [facility] 6417321549 [facility] [phone] Floydene Flock [phone] Residential & Outpatient Substance Abuse Program  [phone]  Abuse/Neglect [facility] Child Abuse Hotline ([phone] [facility] Child Abuse Hotline 8383639866 (After Hours)  Emergency Shelter [facility] 3434962456  Maternity Homes [facility] [place] [facility] [phone] [facility] [phone]  [facility] Resources  [facility]     [employer]                          [facility] Dept. [address] [place]                       [address]      [address]  Blondell Reveal Phone:  [phone]                                   Phone:  [phone]                 Phone:  [phone]  [facility] Phone:  [phone]  [facility] Child Abuse Hotline [phone] [phone] (After Hours)

## 2012-01-06 NOTE — ED Notes (Signed)
Left foot pain and tingling in her toes x 1 week. No known injury.

## 2012-01-28 ENCOUNTER — Ambulatory Visit: Payer: Medicaid Other | Admitting: Orthopedic Surgery

## 2012-01-30 ENCOUNTER — Encounter (HOSPITAL_BASED_OUTPATIENT_CLINIC_OR_DEPARTMENT_OTHER): Payer: Self-pay | Admitting: *Deleted

## 2012-01-30 ENCOUNTER — Emergency Department (HOSPITAL_BASED_OUTPATIENT_CLINIC_OR_DEPARTMENT_OTHER)
Admission: EM | Admit: 2012-01-30 | Discharge: 2012-01-30 | Disposition: A | Discharge status: Home / Self Care | Payer: Medicaid Other | Attending: Emergency Medicine | Admitting: Emergency Medicine

## 2012-01-30 DIAGNOSIS — F17200 Nicotine dependence, unspecified, uncomplicated: Secondary | ICD-10-CM | POA: Insufficient documentation

## 2012-01-30 DIAGNOSIS — K219 Gastro-esophageal reflux disease without esophagitis: Secondary | ICD-10-CM | POA: Insufficient documentation

## 2012-01-30 DIAGNOSIS — Z8521 Personal history of malignant neoplasm of larynx: Secondary | ICD-10-CM | POA: Insufficient documentation

## 2012-01-30 DIAGNOSIS — M549 Dorsalgia, unspecified: Secondary | ICD-10-CM

## 2012-01-30 MED ORDER — ONDANSETRON HCL 4 MG/2ML IJ SOLN
4.0000 mg | Freq: Once | INTRAMUSCULAR | Status: AC
  Administered 2012-01-30: 4 mg via INTRAMUSCULAR
  Filled 2012-01-30: qty 2

## 2012-01-30 MED ORDER — KETOROLAC TROMETHAMINE 60 MG/2ML IM SOLN
60.0000 mg | Freq: Once | INTRAMUSCULAR | Status: AC
  Administered 2012-01-30: 60 mg via INTRAMUSCULAR
  Filled 2012-01-30 (×2): qty 2

## 2012-01-30 MED ORDER — OXYCODONE-ACETAMINOPHEN 5-325 MG PO TABS: 2.0000 | ORAL_TABLET | ORAL | Status: AC | PRN

## 2012-01-30 MED ORDER — PREDNISONE 10 MG PO TABS: ORAL_TABLET | ORAL | Status: DC

## 2012-01-30 MED ORDER — HYDROMORPHONE HCL PF 2 MG/ML IJ SOLN
2.0000 mg | Freq: Once | INTRAMUSCULAR | Status: AC
  Administered 2012-01-30: 2 mg via INTRAVENOUS
  Filled 2012-01-30: qty 1

## 2012-01-30 NOTE — ED Provider Notes (Signed)
Medical screening examination/treatment/procedure(s) were performed by non-physician practitioner and as supervising physician I was immediately available for consultation/collaboration.   Siboney Requejo W Dwana Garin, MD 01/30/12 1457 

## 2012-01-30 NOTE — ED Provider Notes (Signed)
History     CSN: [id number]  Arrival date & time 01/30/12  1153   First MD Initiated Contact with Patient 01/30/12 1216      Chief Complaint  Patient presents with  . Back Pain    (Consider location/radiation/quality/duration/timing/severity/associated sxs/prior treatment) Patient is a 46 y.o. female presenting with back pain. The history is provided by the patient. No language interpreter was used.  Back Pain  This is a chronic problem. The current episode started yesterday. The problem occurs constantly. The problem has been gradually worsening. The pain is associated with a recent injury. The pain is present in the lumbar spine. The quality of the pain is described as aching. The pain is moderate. The symptoms are aggravated by certain positions. The pain is the same all the time. Stiffness is present all day. She has tried NSAIDs for the symptoms.  Pt reports she cleaned bathroom and has had increase in back pain since. Pt has chronic back pain and sees Dr. Anne Hahn.   Pt reports she is suppose to have an MRi but medicaid denied.   Past Medical History  Diagnosis Date  . IBS (irritable bowel syndrome)   . GERD (gastroesophageal reflux disease)     IBS  . Cancer   . Vocal cord cancer   . Arthritis     disk disease    Past Surgical History  Procedure Date  . Carpal tunnel release   . Nose surgery   . Tonsillectomy   . Cleft palate repair   . Laparoscopic tubal ligation     [facility]  . Throat surgery 2000    for throat cancer/no problems [facility]  . Endometrial ablation     History reviewed. No pertinent family history.  History  Substance Use Topics  . Smoking status: Current Everyday Smoker -- 1.0 packs/day for 26 years    Types: Cigarettes  . Smokeless tobacco: Not on file  . Alcohol Use: 1.0 oz/week    2 drink(s) per week     occasional    OB History    Grav Para Term Preterm Abortions TAB SAB Ect Mult Living   1 1        1        Review of Systems  Musculoskeletal: Positive for back pain.  All other systems reviewed and are negative.    Allergies  Bee venom  Home Medications   Current Outpatient Rx  Name Route Sig Dispense Refill  . PREGABALIN 100 MG PO CAPS Oral Take 100 mg by mouth 2 (two) times daily.    . BC HEADACHE POWDER PO Oral Take 1 packet by mouth 2 (two) times daily as needed. For occasional pain    . EPINEPHRINE 0.3 MG/0.3ML IJ DEVI Intramuscular Inject 0.3 mg into the muscle once as needed. For bee venom    . ESOMEPRAZOLE MAGNESIUM 40 MG PO CPDR Oral Take 40 mg by mouth every morning.     . IBUPROFEN 200 MG PO TABS Oral Take 400-800 mg by mouth every 6 (six) hours as needed. For pain    . MELOXICAM 7.5 MG PO TABS Oral Take 1 tablet (7.5 mg total) by mouth daily. 10 tablet 0    BP 142/85  Pulse 70  Temp 98 F (36.7 C) (Oral)  Resp 20  SpO2 99%  LMP 12/27/2011  Physical Exam  Nursing note and vitals reviewed. Constitutional: She appears well-developed and well-nourished.  Cardiovascular: Normal rate.   Pulmonary/Chest: Effort normal.  Abdominal: Soft.  Musculoskeletal:       Tender lumbar,  Dtr's equal,  Normal senstaion  Neurological: She is alert.  Skin: Skin is warm.  Psychiatric: She has a normal mood and affect.    ED Course  Procedures (including critical care time)  Labs Reviewed - No data to display No results found.   1. Back pain       MDM  I advised pt to see Dr. Anne Hahn for recheck next week.   Pt given rx for prednisone taper and percocet .        Lonia Skinner [place], [place] 01/30/12 1250  Lonia Skinner [place], [place] 01/30/12 1252  Lonia Skinner [place], [place] 01/30/12 1252

## 2012-01-30 NOTE — ED Notes (Signed)
Pt states back started hurting severely this morning was unable to have the mri she had been advised to have denies injury

## 2012-02-12 ENCOUNTER — Other Ambulatory Visit (HOSPITAL_COMMUNITY): Payer: Self-pay | Admitting: Neurology

## 2012-02-12 ENCOUNTER — Ambulatory Visit (HOSPITAL_COMMUNITY): Payer: Medicaid Other

## 2012-02-12 DIAGNOSIS — M79609 Pain in unspecified limb: Secondary | ICD-10-CM

## 2012-02-12 DIAGNOSIS — G5710 Meralgia paresthetica, unspecified lower limb: Secondary | ICD-10-CM

## 2012-02-12 DIAGNOSIS — G544 Lumbosacral root disorders, not elsewhere classified: Secondary | ICD-10-CM

## 2012-02-14 ENCOUNTER — Ambulatory Visit (HOSPITAL_COMMUNITY)
Admission: RE | Admit: 2012-02-14 | Discharge: 2012-02-14 | Disposition: A | Discharge status: Home / Self Care | Payer: Medicaid Other | Source: Ambulatory Visit | Attending: Neurology | Admitting: Neurology

## 2012-02-14 DIAGNOSIS — M545 Low back pain, unspecified: Secondary | ICD-10-CM | POA: Insufficient documentation

## 2012-02-14 DIAGNOSIS — G544 Lumbosacral root disorders, not elsewhere classified: Secondary | ICD-10-CM

## 2012-02-14 DIAGNOSIS — G5710 Meralgia paresthetica, unspecified lower limb: Secondary | ICD-10-CM

## 2012-02-14 DIAGNOSIS — R209 Unspecified disturbances of skin sensation: Secondary | ICD-10-CM | POA: Insufficient documentation

## 2012-02-14 DIAGNOSIS — M51379 Other intervertebral disc degeneration, lumbosacral region without mention of lumbar back pain or lower extremity pain: Secondary | ICD-10-CM | POA: Insufficient documentation

## 2012-02-14 DIAGNOSIS — M5137 Other intervertebral disc degeneration, lumbosacral region: Secondary | ICD-10-CM | POA: Insufficient documentation

## 2012-02-14 DIAGNOSIS — IMO0002 Reserved for concepts with insufficient information to code with codable children: Secondary | ICD-10-CM | POA: Insufficient documentation

## 2012-02-14 DIAGNOSIS — M5126 Other intervertebral disc displacement, lumbar region: Secondary | ICD-10-CM | POA: Insufficient documentation

## 2012-02-14 DIAGNOSIS — M79609 Pain in unspecified limb: Secondary | ICD-10-CM

## 2012-03-09 ENCOUNTER — Encounter (HOSPITAL_COMMUNITY): Payer: Self-pay

## 2012-03-09 ENCOUNTER — Other Ambulatory Visit: Payer: Self-pay | Admitting: Neurosurgery

## 2012-03-17 ENCOUNTER — Encounter (HOSPITAL_COMMUNITY)
Admission: RE | Admit: 2012-03-17 | Discharge: 2012-03-17 | Disposition: A | Discharge status: Home / Self Care | Payer: Medicaid Other | Source: Ambulatory Visit | Attending: Neurosurgery | Admitting: Neurosurgery

## 2012-03-17 ENCOUNTER — Encounter (HOSPITAL_COMMUNITY): Payer: Self-pay

## 2012-03-17 LAB — BASIC METABOLIC PANEL
BUN: 11 mg/dL (ref 6–23)
CO2: 25 mEq/L (ref 19–32)
Calcium: 9.3 mg/dL (ref 8.4–10.5)
Chloride: 104 mEq/L (ref 96–112)
Creatinine, Ser: 0.65 mg/dL (ref 0.50–1.10)

## 2012-03-17 LAB — CBC
HCT: 41.6 % (ref 36.0–46.0)
MCHC: 33.7 g/dL (ref 30.0–36.0)
MCV: 93.1 fL (ref 78.0–100.0)
Platelets: 319 10*3/uL (ref 150–400)
RDW: 13 % (ref 11.5–15.5)

## 2012-03-17 LAB — SURGICAL PCR SCREEN: MRSA, PCR: NEGATIVE

## 2012-03-17 NOTE — Pre-Procedure Instructions (Signed)
20 [name]  03/17/2012   Your procedure is scheduled on:  03-19-2012  Report to [facility] at  5:30AM.  Call this number if you have problems the morning of surgery: [phone]   Remember:   Do not eat food or drink:After Midnight.  .  Take these medicines the morning of surgery with A SIP OF WATER: Pantoprazole(Protonix)    Do not wear jewelry, make-up or nail polish.  Do not wear lotions, powders, or perfumes. You may wear deodorant.  Do not shave 48 hours prior to surgery. Men may shave face and neck.  Do not bring valuables to the hospital.  Contacts, dentures or bridgework may not be worn into surgery.  Leave suitcase in the car. After surgery it may be brought to your room.  For patients admitted to the hospital, checkout time is 11:00 AM the day of discharge.  Marland Kitchen   Special Instructions: CHG Shower Use Special Wash: 1/2 bottle night before surgery and 1/2 bottle morning of surgery.     Please read over the following fact sheets that you were given: Pain Booklet, Coughing and Deep Breathing, MRSA Information and Surgical Site Infection Prevention

## 2012-03-18 MED ORDER — DEXAMETHASONE SODIUM PHOSPHATE 10 MG/ML IJ SOLN
10.0000 mg | INTRAMUSCULAR | Status: AC
  Administered 2012-03-19: 10 mg via INTRAVENOUS
  Filled 2012-03-18: qty 1

## 2012-03-18 MED ORDER — CEFAZOLIN SODIUM-DEXTROSE 2-3 GM-% IV SOLR
2.0000 g | INTRAVENOUS | Status: AC
  Administered 2012-03-19: 2 g via INTRAVENOUS
  Filled 2012-03-18: qty 50

## 2012-03-19 ENCOUNTER — Encounter (HOSPITAL_COMMUNITY): Payer: Self-pay | Admitting: Certified Registered Nurse Anesthetist

## 2012-03-19 ENCOUNTER — Ambulatory Visit (HOSPITAL_COMMUNITY): Payer: Medicaid Other | Admitting: Certified Registered Nurse Anesthetist

## 2012-03-19 ENCOUNTER — Ambulatory Visit (HOSPITAL_COMMUNITY): Payer: Medicaid Other

## 2012-03-19 ENCOUNTER — Encounter (HOSPITAL_COMMUNITY)
Admission: RE | Disposition: A | Discharge status: Home Health | Payer: Self-pay | Source: Ambulatory Visit | Attending: Neurosurgery

## 2012-03-19 ENCOUNTER — Encounter (HOSPITAL_COMMUNITY): Payer: Self-pay | Admitting: *Deleted

## 2012-03-19 ENCOUNTER — Inpatient Hospital Stay (HOSPITAL_COMMUNITY)
Admission: RE | Admit: 2012-03-19 | Discharge: 2012-03-31 | DRG: 460 | Disposition: A | Discharge status: Home Health | Payer: Medicaid Other | Source: Ambulatory Visit | Attending: Neurosurgery | Admitting: Neurosurgery

## 2012-03-19 DIAGNOSIS — Z79899 Other long term (current) drug therapy: Secondary | ICD-10-CM

## 2012-03-19 DIAGNOSIS — Y838 Other surgical procedures as the cause of abnormal reaction of the patient, or of later complication, without mention of misadventure at the time of the procedure: Secondary | ICD-10-CM | POA: Diagnosis not present

## 2012-03-19 DIAGNOSIS — M48061 Spinal stenosis, lumbar region without neurogenic claudication: Principal | ICD-10-CM | POA: Diagnosis present

## 2012-03-19 DIAGNOSIS — Q762 Congenital spondylolisthesis: Secondary | ICD-10-CM

## 2012-03-19 DIAGNOSIS — M129 Arthropathy, unspecified: Secondary | ICD-10-CM | POA: Diagnosis present

## 2012-03-19 DIAGNOSIS — F17200 Nicotine dependence, unspecified, uncomplicated: Secondary | ICD-10-CM | POA: Diagnosis present

## 2012-03-19 DIAGNOSIS — K929 Disease of digestive system, unspecified: Secondary | ICD-10-CM | POA: Diagnosis not present

## 2012-03-19 DIAGNOSIS — Y9210 Unspecified residential institution as the place of occurrence of the external cause: Secondary | ICD-10-CM | POA: Diagnosis not present

## 2012-03-19 DIAGNOSIS — K219 Gastro-esophageal reflux disease without esophagitis: Secondary | ICD-10-CM | POA: Diagnosis present

## 2012-03-19 DIAGNOSIS — Z8521 Personal history of malignant neoplasm of larynx: Secondary | ICD-10-CM

## 2012-03-19 DIAGNOSIS — M549 Dorsalgia, unspecified: Secondary | ICD-10-CM

## 2012-03-19 DIAGNOSIS — K560 Paralytic ileus: Secondary | ICD-10-CM | POA: Diagnosis not present

## 2012-03-19 DIAGNOSIS — K589 Irritable bowel syndrome without diarrhea: Secondary | ICD-10-CM | POA: Diagnosis present

## 2012-03-19 DIAGNOSIS — T8140XA Infection following a procedure, unspecified, initial encounter: Secondary | ICD-10-CM | POA: Diagnosis not present

## 2012-03-19 DIAGNOSIS — Z01812 Encounter for preprocedural laboratory examination: Secondary | ICD-10-CM

## 2012-03-19 HISTORY — PX: ANTERIOR LAT LUMBAR FUSION: SHX1168

## 2012-03-19 SURGERY — ANTERIOR LATERAL LUMBAR FUSION 1 LEVEL
Anesthesia: General | Laterality: Right | Wound class: Other (see notes)

## 2012-03-19 MED ORDER — HYDROMORPHONE HCL PF 1 MG/ML IJ SOLN
0.2500 mg | INTRAMUSCULAR | Status: DC | PRN
  Administered 2012-03-19 (×2): 0.5 mg via INTRAVENOUS
  Administered 2012-03-19: 1 mg via INTRAVENOUS
  Administered 2012-03-19 (×2): 0.5 mg via INTRAVENOUS
  Administered 2012-03-19: 1 mg via INTRAVENOUS

## 2012-03-19 MED ORDER — SODIUM CHLORIDE 0.9 % IV SOLN
INTRAVENOUS | Status: AC
  Filled 2012-03-19: qty 500

## 2012-03-19 MED ORDER — MIDAZOLAM HCL 5 MG/5ML IJ SOLN
INTRAMUSCULAR | Status: DC | PRN
  Administered 2012-03-19: 2 mg via INTRAVENOUS

## 2012-03-19 MED ORDER — PHENOL 1.4 % MT LIQD: 1.0000 | OROMUCOSAL | Status: DC | PRN

## 2012-03-19 MED ORDER — HYDROMORPHONE HCL PF 1 MG/ML IJ SOLN: 0.2500 mg | INTRAMUSCULAR | Status: DC | PRN

## 2012-03-19 MED ORDER — BACITRACIN 50000 UNITS IM SOLR
INTRAMUSCULAR | Status: AC
  Filled 2012-03-19: qty 1

## 2012-03-19 MED ORDER — VECURONIUM BROMIDE 10 MG IV SOLR
INTRAVENOUS | Status: DC | PRN
  Administered 2012-03-19: 2 mg via INTRAVENOUS
  Administered 2012-03-19: 5 mg via INTRAVENOUS

## 2012-03-19 MED ORDER — SODIUM CHLORIDE 0.9 % IV SOLN
INTRAVENOUS | Status: DC | PRN
  Administered 2012-03-19: 10:00:00 via INTRAVENOUS

## 2012-03-19 MED ORDER — CYCLOBENZAPRINE HCL 10 MG PO TABS
ORAL_TABLET | ORAL | Status: AC
  Filled 2012-03-19: qty 1

## 2012-03-19 MED ORDER — ONDANSETRON HCL 4 MG/2ML IJ SOLN
INTRAMUSCULAR | Status: DC | PRN
  Administered 2012-03-19: 4 mg via INTRAVENOUS

## 2012-03-19 MED ORDER — HEMOSTATIC AGENTS (NO CHARGE) OPTIME
TOPICAL | Status: DC | PRN
  Administered 2012-03-19: 1 via TOPICAL

## 2012-03-19 MED ORDER — NEOSTIGMINE METHYLSULFATE 1 MG/ML IJ SOLN
INTRAMUSCULAR | Status: DC | PRN
  Administered 2012-03-19: 3 mg via INTRAVENOUS

## 2012-03-19 MED ORDER — HYDROCODONE-ACETAMINOPHEN 5-325 MG PO TABS
ORAL_TABLET | ORAL | Status: AC
  Filled 2012-03-19: qty 2

## 2012-03-19 MED ORDER — SODIUM CHLORIDE 0.9 % IJ SOLN
3.0000 mL | Freq: Two times a day (BID) | INTRAMUSCULAR | Status: DC
  Administered 2012-03-19 – 2012-03-30 (×12): 3 mL via INTRAVENOUS

## 2012-03-19 MED ORDER — LIDOCAINE HCL (CARDIAC) 20 MG/ML IV SOLN
INTRAVENOUS | Status: DC | PRN
  Administered 2012-03-19: 100 mg via INTRAVENOUS

## 2012-03-19 MED ORDER — LACTATED RINGERS IV SOLN
INTRAVENOUS | Status: DC | PRN
  Administered 2012-03-19 (×2): via INTRAVENOUS

## 2012-03-19 MED ORDER — GLYCOPYRROLATE 0.2 MG/ML IJ SOLN
INTRAMUSCULAR | Status: DC | PRN
  Administered 2012-03-19: .4 mg via INTRAVENOUS

## 2012-03-19 MED ORDER — HYDROMORPHONE HCL PF 1 MG/ML IJ SOLN
INTRAMUSCULAR | Status: AC
  Filled 2012-03-19: qty 2

## 2012-03-19 MED ORDER — FENTANYL CITRATE 0.05 MG/ML IJ SOLN
INTRAMUSCULAR | Status: DC | PRN
  Administered 2012-03-19: 150 ug via INTRAVENOUS
  Administered 2012-03-19: 100 ug via INTRAVENOUS
  Administered 2012-03-19: 50 ug via INTRAVENOUS
  Administered 2012-03-19: 100 ug via INTRAVENOUS
  Administered 2012-03-19 (×2): 50 ug via INTRAVENOUS

## 2012-03-19 MED ORDER — HYDROMORPHONE HCL PF 1 MG/ML IJ SOLN
1.0000 mg | INTRAMUSCULAR | Status: DC | PRN
  Administered 2012-03-19: 1.5 mg via INTRAMUSCULAR
  Administered 2012-03-19: 1 mg via INTRAMUSCULAR
  Administered 2012-03-19 – 2012-03-20 (×2): 0.5 mg via INTRAMUSCULAR
  Administered 2012-03-20: 1.5 mg via INTRAMUSCULAR
  Administered 2012-03-20 (×3): 1 mg via INTRAMUSCULAR
  Administered 2012-03-21 – 2012-03-26 (×27): 1.5 mg via INTRAMUSCULAR
  Administered 2012-03-26: 1 mg via INTRAMUSCULAR
  Administered 2012-03-26 – 2012-03-27 (×10): 1.5 mg via INTRAMUSCULAR
  Administered 2012-03-28 (×2): 1 mg via INTRAMUSCULAR
  Administered 2012-03-28 (×2): 1.5 mg via INTRAMUSCULAR
  Administered 2012-03-28: 1 mg via INTRAMUSCULAR
  Administered 2012-03-29 (×3): 1.5 mg via INTRAMUSCULAR
  Administered 2012-03-30: 1 mg via INTRAMUSCULAR
  Administered 2012-03-30: 1.5 mg via INTRAMUSCULAR
  Administered 2012-03-30: 1 mg via INTRAMUSCULAR
  Administered 2012-03-30: 1.5 mg via INTRAMUSCULAR
  Administered 2012-03-30 – 2012-03-31 (×5): 1 mg via INTRAMUSCULAR
  Filled 2012-03-19 (×2): qty 1
  Filled 2012-03-19 (×4): qty 2
  Filled 2012-03-19: qty 1
  Filled 2012-03-19: qty 2
  Filled 2012-03-19 (×3): qty 1
  Filled 2012-03-19: qty 2
  Filled 2012-03-19 (×2): qty 1
  Filled 2012-03-19 (×7): qty 2
  Filled 2012-03-19: qty 1
  Filled 2012-03-19 (×2): qty 2
  Filled 2012-03-19: qty 1
  Filled 2012-03-19 (×6): qty 2
  Filled 2012-03-19: qty 1
  Filled 2012-03-19: qty 2
  Filled 2012-03-19: qty 1
  Filled 2012-03-19 (×5): qty 2
  Filled 2012-03-19: qty 1
  Filled 2012-03-19 (×18): qty 2
  Filled 2012-03-19: qty 1
  Filled 2012-03-19 (×2): qty 2
  Filled 2012-03-19 (×2): qty 1
  Filled 2012-03-19: qty 2

## 2012-03-19 MED ORDER — MENTHOL 3 MG MT LOZG
1.0000 | LOZENGE | OROMUCOSAL | Status: DC | PRN
  Filled 2012-03-19: qty 9

## 2012-03-19 MED ORDER — LIDOCAINE HCL 4 % MT SOLN
OROMUCOSAL | Status: DC | PRN
  Administered 2012-03-19: 4 mL via TOPICAL

## 2012-03-19 MED ORDER — SODIUM CHLORIDE 0.9 % IJ SOLN: 3.0000 mL | INTRAMUSCULAR | Status: DC | PRN

## 2012-03-19 MED ORDER — SUCCINYLCHOLINE CHLORIDE 20 MG/ML IJ SOLN
INTRAMUSCULAR | Status: DC | PRN
  Administered 2012-03-19: 100 mg via INTRAVENOUS

## 2012-03-19 MED ORDER — THROMBIN 5000 UNITS EX SOLR
CUTANEOUS | Status: DC | PRN
  Administered 2012-03-19 (×2): 5000 [IU] via TOPICAL

## 2012-03-19 MED ORDER — ACETAMINOPHEN 325 MG PO TABS
650.0000 mg | ORAL_TABLET | ORAL | Status: DC | PRN
  Administered 2012-03-30: 650 mg via ORAL
  Filled 2012-03-19: qty 2

## 2012-03-19 MED ORDER — HYDROCODONE-ACETAMINOPHEN 5-325 MG PO TABS
1.0000 | ORAL_TABLET | ORAL | Status: DC | PRN
  Administered 2012-03-19 (×2): 2 via ORAL
  Filled 2012-03-19: qty 2

## 2012-03-19 MED ORDER — ACETAMINOPHEN 650 MG RE SUPP: 650.0000 mg | RECTAL | Status: DC | PRN

## 2012-03-19 MED ORDER — OXYCODONE-ACETAMINOPHEN 5-325 MG PO TABS
1.0000 | ORAL_TABLET | ORAL | Status: DC | PRN
  Administered 2012-03-19 – 2012-03-25 (×17): 2 via ORAL
  Filled 2012-03-19 (×18): qty 2

## 2012-03-19 MED ORDER — ONDANSETRON HCL 4 MG/2ML IJ SOLN: 4.0000 mg | Freq: Once | INTRAMUSCULAR | Status: DC | PRN

## 2012-03-19 MED ORDER — HYDROMORPHONE HCL PF 1 MG/ML IJ SOLN
INTRAMUSCULAR | Status: AC
  Filled 2012-03-19: qty 1

## 2012-03-19 MED ORDER — SODIUM CHLORIDE 0.9 % IV SOLN: 250.0000 mL | INTRAVENOUS | Status: DC

## 2012-03-19 MED ORDER — 0.9 % SODIUM CHLORIDE (POUR BTL) OPTIME
TOPICAL | Status: DC | PRN
  Administered 2012-03-19 (×2): 1000 mL

## 2012-03-19 MED ORDER — ONDANSETRON HCL 4 MG/2ML IJ SOLN
4.0000 mg | INTRAMUSCULAR | Status: DC | PRN
  Administered 2012-03-22: 4 mg via INTRAVENOUS
  Filled 2012-03-19: qty 2

## 2012-03-19 MED ORDER — KCL IN DEXTROSE-NACL 20-5-0.45 MEQ/L-%-% IV SOLN
80.0000 mL/h | INTRAVENOUS | Status: DC
  Administered 2012-03-19 – 2012-03-24 (×5): 80 mL/h via INTRAVENOUS
  Filled 2012-03-19 (×25): qty 1000

## 2012-03-19 MED ORDER — PROPOFOL 10 MG/ML IV EMUL
INTRAVENOUS | Status: DC | PRN
  Administered 2012-03-19: 200 mg via INTRAVENOUS

## 2012-03-19 MED ORDER — PROPOFOL 10 MG/ML IV EMUL
INTRAVENOUS | Status: DC | PRN
  Administered 2012-03-19 (×2): 100 ug/kg/min via INTRAVENOUS

## 2012-03-19 MED ORDER — BUPIVACAINE HCL 0.5 % IJ SOLN
INTRAMUSCULAR | Status: DC | PRN
  Administered 2012-03-19: 30 mL
  Administered 2012-03-19: 50 mL

## 2012-03-19 MED ORDER — CYCLOBENZAPRINE HCL 10 MG PO TABS
10.0000 mg | ORAL_TABLET | Freq: Three times a day (TID) | ORAL | Status: DC | PRN
  Administered 2012-03-19 – 2012-03-30 (×15): 10 mg via ORAL
  Filled 2012-03-19 (×16): qty 1

## 2012-03-19 MED ORDER — SODIUM CHLORIDE 0.9 % IR SOLN
Status: DC | PRN
  Administered 2012-03-19 (×2)

## 2012-03-19 MED ORDER — EPINEPHRINE 0.3 MG/0.3ML IJ DEVI: 0.3000 mg | Freq: Once | INTRAMUSCULAR | Status: AC | PRN

## 2012-03-19 MED ORDER — CEFAZOLIN SODIUM-DEXTROSE 2-3 GM-% IV SOLR
2.0000 g | Freq: Three times a day (TID) | INTRAVENOUS | Status: AC
  Administered 2012-03-19 – 2012-03-20 (×2): 2 g via INTRAVENOUS
  Filled 2012-03-19 (×2): qty 50

## 2012-03-19 SURGICAL SUPPLY — 75 items
ADH SKN CLS APL DERMABOND .7 (GAUZE/BANDAGES/DRESSINGS) ×2
APL SKNCLS STERI-STRIP NONHPOA (GAUZE/BANDAGES/DRESSINGS) ×2
BAG DECANTER FOR FLEXI CONT (MISCELLANEOUS) ×6 IMPLANT
BENZOIN TINCTURE PRP APPL 2/3 (GAUZE/BANDAGES/DRESSINGS) ×7 IMPLANT
BLADE SURG ROTATE 9660 (MISCELLANEOUS) IMPLANT
BONE EQUIVA 5CC (Bone Implant) ×1 IMPLANT
BONE MATRIX OSTEOCEL PLUS 5CC (Bone Implant) ×1 IMPLANT
BRUSH SCRUB EZ PLAIN DRY (MISCELLANEOUS) ×3 IMPLANT
CLOTH BEACON ORANGE TIMEOUT ST (SAFETY) ×6 IMPLANT
CONT SPEC 4OZ CLIKSEAL STRL BL (MISCELLANEOUS) ×1 IMPLANT
COROENT PLUS 8X18X50 (Cage) ×1 IMPLANT
COVER BACK TABLE 24X17X13 BIG (DRAPES) IMPLANT
COVER TABLE BACK 60X90 (DRAPES) ×3 IMPLANT
DERMABOND ADVANCED (GAUZE/BANDAGES/DRESSINGS) ×1
DERMABOND ADVANCED .7 DNX12 (GAUZE/BANDAGES/DRESSINGS) ×6 IMPLANT
DRAPE C-ARM 42X72 X-RAY (DRAPES) ×6 IMPLANT
DRAPE C-ARMOR (DRAPES) ×6 IMPLANT
DRAPE LAPAROTOMY 100X72X124 (DRAPES) ×6 IMPLANT
DRAPE SURG 17X23 STRL (DRAPES) ×12 IMPLANT
DRESSING TELFA 8X3 (GAUZE/BANDAGES/DRESSINGS) ×5 IMPLANT
DURAPREP 26ML APPLICATOR (WOUND CARE) ×3 IMPLANT
ELECT BLADE 4.0 EZ CLEAN MEGAD (MISCELLANEOUS) ×3
ELECT REM PT RETURN 9FT ADLT (ELECTROSURGICAL) ×6
ELECTRODE BLDE 4.0 EZ CLN MEGD (MISCELLANEOUS) ×2 IMPLANT
ELECTRODE REM PT RTRN 9FT ADLT (ELECTROSURGICAL) ×4 IMPLANT
EVACUATOR 1/8 PVC DRAIN (DRAIN) IMPLANT
GAUZE SPONGE 4X4 16PLY XRAY LF (GAUZE/BANDAGES/DRESSINGS) ×1 IMPLANT
GLOVE BIO SURGEON STRL SZ8 (GLOVE) IMPLANT
GLOVE BIOGEL PI IND STRL 7.0 (GLOVE) IMPLANT
GLOVE BIOGEL PI IND STRL 7.5 (GLOVE) IMPLANT
GLOVE BIOGEL PI IND STRL 8 (GLOVE) IMPLANT
GLOVE BIOGEL PI IND STRL 8.5 (GLOVE) IMPLANT
GLOVE BIOGEL PI INDICATOR 7.0 (GLOVE) ×1
GLOVE BIOGEL PI INDICATOR 7.5 (GLOVE) ×2
GLOVE BIOGEL PI INDICATOR 8 (GLOVE) ×1
GLOVE BIOGEL PI INDICATOR 8.5 (GLOVE) ×1
GLOVE ECLIPSE 7.5 STRL STRAW (GLOVE) ×8 IMPLANT
GLOVE EXAM NITRILE LRG STRL (GLOVE) IMPLANT
GLOVE EXAM NITRILE XL STR (GLOVE) IMPLANT
GLOVE EXAM NITRILE XS STR PU (GLOVE) IMPLANT
GLOVE SURG SS PI 6.5 STRL IVOR (GLOVE) ×2 IMPLANT
GLOVE SURG SS PI 8.0 STRL IVOR (GLOVE) ×2 IMPLANT
GOWN BRE IMP SLV AUR LG STRL (GOWN DISPOSABLE) ×3 IMPLANT
GOWN BRE IMP SLV AUR XL STRL (GOWN DISPOSABLE) ×3 IMPLANT
GOWN STRL REIN 2XL LVL4 (GOWN DISPOSABLE) ×5 IMPLANT
K-WIRE NITHNOL TROCAR TIP (WIRE) ×4 IMPLANT
KIT BASIN OR (CUSTOM PROCEDURE TRAY) ×6 IMPLANT
KIT DILATOR XLIF 5 (KITS) IMPLANT
KIT MAXCESS (KITS) ×1 IMPLANT
KIT NDL NVM5 EMG ELECT (KITS) IMPLANT
KIT NEEDLE NVM5 EMG ELECT (KITS) ×2 IMPLANT
KIT NEEDLE NVM5 EMG ELECTRODE (KITS) ×1
KIT ROOM TURNOVER OR (KITS) ×5 IMPLANT
KIT XLIF (KITS) ×1
NEEDLE HYPO 22GX1.5 SAFETY (NEEDLE) ×6 IMPLANT
NEEDLE TARGETING (NEEDLE) ×4 IMPLANT
NS IRRIG 1000ML POUR BTL (IV SOLUTION) ×5 IMPLANT
PACK LAMINECTOMY NEURO (CUSTOM PROCEDURE TRAY) ×5 IMPLANT
ROD PATHFINDER 50MM (Rod) ×2 IMPLANT
SCREW PATHFINDER 6.5X50 (Screw) ×4 IMPLANT
SPONGE GAUZE 4X4 12PLY (GAUZE/BANDAGES/DRESSINGS) ×5 IMPLANT
SPONGE LAP 4X18 X RAY DECT (DISPOSABLE) IMPLANT
SPONGE SURGIFOAM ABS GEL SZ50 (HEMOSTASIS) IMPLANT
STRIP CLOSURE SKIN 1/2X4 (GAUZE/BANDAGES/DRESSINGS) ×6 IMPLANT
SUT VIC AB 2-0 OS6 18 (SUTURE) ×27 IMPLANT
SUT VIC AB 3-0 CP2 18 (SUTURE) ×11 IMPLANT
SYR 20ML ECCENTRIC (SYRINGE) ×6 IMPLANT
TAPE CLOTH 3X10 TAN LF (GAUZE/BANDAGES/DRESSINGS) ×6 IMPLANT
TISSUE DILATOR C. RADIOLUCENT ×1 IMPLANT
TOP CLSR SEQUOIA (Orthopedic Implant) ×4 IMPLANT
TOWEL OR 17X24 6PK STRL BLUE (TOWEL DISPOSABLE) ×5 IMPLANT
TOWEL OR 17X26 10 PK STRL BLUE (TOWEL DISPOSABLE) ×5 IMPLANT
TRAP SPECIMEN MUCOUS 40CC (MISCELLANEOUS) IMPLANT
TRAY FOLEY CATH 14FRSI W/METER (CATHETERS) ×3 IMPLANT
WATER STERILE IRR 1000ML POUR (IV SOLUTION) ×6 IMPLANT

## 2012-03-19 NOTE — Anesthesia Procedure Notes (Signed)
Procedure Name: Intubation Date/Time: 03/19/2012 7:47 AM Performed by: Margaree Mackintosh Pre-anesthesia Checklist: Patient identified, Timeout performed, Emergency Drugs available, Suction available and Patient being monitored Patient Re-evaluated:Patient Re-evaluated prior to inductionOxygen Delivery Method: Circle system utilized Preoxygenation: Pre-oxygenation with 100% oxygen Intubation Type: IV induction Ventilation: Mask ventilation with difficulty Laryngoscope Size: Mac and 4 Grade View: Grade I Tube type: Oral Tube size: 7.5 mm Number of attempts: 1 Airway Equipment and Method: Stylet and LTA kit utilized Placement Confirmation: ETT inserted through vocal cords under direct vision,  positive ETCO2 and breath sounds checked- equal and bilateral Secured at: 22 cm Tube secured with: Tape Dental Injury: Teeth and Oropharynx as per pre-operative assessment

## 2012-03-19 NOTE — OR Nursing (Signed)
Neuro Monitoring Provided by Livingston Diones, Nurses put in needles for monitoring and took out, assessed areas. Placement help from [employer].

## 2012-03-19 NOTE — Preoperative (Signed)
Beta Blockers   Reason not to administer Beta Blockers:Not Applicable 

## 2012-03-19 NOTE — Anesthesia Postprocedure Evaluation (Signed)
  Anesthesia Post-op Note  Patient: [name]  Procedure(s) Performed: Procedure(s) (LRB): ANTERIOR LATERAL LUMBAR FUSION 1 LEVEL (Right) LUMBAR PERCUTANEOUS PEDICLE SCREW 1 LEVEL (N/A)  Patient Location: PACU  Anesthesia Type: General  Level of Consciousness: awake, alert , oriented, sedated and patient cooperative  Airway and Oxygen Therapy: Patient Spontanous Breathing and Patient connected to nasal cannula oxygen  Post-op Pain: moderate  Post-op Assessment: Post-op Vital signs reviewed, Patient's Cardiovascular Status Stable, Respiratory Function Stable, Patent Airway, No signs of Nausea or vomiting and Pain level controlled  Post-op Vital Signs: stable  Complications: No apparent anesthesia complications

## 2012-03-19 NOTE — H&P (Signed)
[name] is an 46 y.o. female.   Chief Complaint: Back and left greater than right leg HPI: The patient is a 80 old female with a long history of back issues with increasing leg pain over the last few years. She has been tried on aggressive conservative therapy without improvement. An MRI scan was obtained which showed a grade 1 listhesis at L3-4 with subsequent stenosis. The options were discussed at this time after failing additional conservative therapy she requested surgery now comes for an L3 for anterolateral fusion via the retroperitoneal approach followed by percutaneous pedicle screw fixation. I have had a long discussion with her regarding the risks and benefits of surgical intervention. The risks discussed include but are not limited to bleeding and infection weakness numbness trouble with instrumentation nonunion coma and death. We have discussed alternative methods of therapy although risks and benefits of nonintervention. She's had the opportunity to rest numerous questions and appears to understand. With this information in hand she has requested we proceed with surgery.  Past Medical History  Diagnosis Date  . IBS (irritable bowel syndrome)   . GERD (gastroesophageal reflux disease)     IBS  . Arthritis     disk disease  . Cancer   . Vocal cord cancer     Past Surgical History  Procedure Date  . Carpal tunnel release   . Nose surgery   . Tonsillectomy   . Cleft palate repair   . Laparoscopic tubal ligation     [facility]  . Throat surgery 2000    for throat cancer/no problems intubation...[facility]  . Endometrial ablation     History reviewed. No pertinent family history. Social History:  reports that she has been smoking Cigarettes.  She has a 26 pack-year smoking history. She does not have any smokeless tobacco history on file. She reports that she drinks about one ounce of alcohol per week. She reports that she does not use illicit  drugs.  Allergies:  Allergies  Allergen Reactions  . Bee Venom Anaphylaxis    Medications Prior to Admission  Medication Sig Dispense Refill  . Aspirin-Salicylamide-Caffeine (BC HEADACHE POWDER PO) Take 1 packet by mouth 2 (two) times daily as needed. For occasional pain      . meloxicam (MOBIC) 15 MG tablet Take 15 mg by mouth daily.      . Omega-3 Fatty Acids (FISH OIL) 1000 MG CAPS Take 1 capsule by mouth daily.      . pantoprazole (PROTONIX) 40 MG tablet Take 40 mg by mouth daily.      Marland Kitchen EPINEPHrine (EPI-PEN) 0.3 mg/0.3 mL DEVI Inject 0.3 mg into the muscle once as needed. For bee venom        Results for orders placed during the hospital encounter of 03/17/12 (from the past 48 hour(s))  SURGICAL PCR SCREEN     Status: Normal   Collection Time   03/17/12 10:22 AM      Component Value Range Comment   MRSA, PCR NEGATIVE  NEGATIVE    Staphylococcus aureus NEGATIVE  NEGATIVE   BASIC METABOLIC PANEL     Status: Normal   Collection Time   03/17/12 10:25 AM      Component Value Range Comment   Sodium 138  135 - 145 mEq/L    Potassium 4.2  3.5 - 5.1 mEq/L    Chloride 104  96 - 112 mEq/L    CO2 25  19 - 32 mEq/L    Glucose, Bld 92  70 - 99 mg/dL    BUN 11  6 - 23 mg/dL    Creatinine, Ser 2.95  0.50 - 1.10 mg/dL    Calcium 9.3  8.4 - 62.1 mg/dL    GFR calc non Af Amer >90  >90 mL/min    GFR calc Af Amer >90  >90 mL/min   CBC     Status: Normal   Collection Time   03/17/12 10:25 AM      Component Value Range Comment   WBC 8.6  4.0 - 10.5 K/uL    RBC 4.47  3.87 - 5.11 MIL/uL    Hemoglobin 14.0  12.0 - 15.0 g/dL    HCT 30.8  65.7 - 84.6 %    MCV 93.1  78.0 - 100.0 fL    MCH 31.3  26.0 - 34.0 pg    MCHC 33.7  30.0 - 36.0 g/dL    RDW 96.2  95.2 - 84.1 %    Platelets 319  150 - 400 K/uL   TYPE AND SCREEN     Status: Normal   Collection Time   03/17/12 10:30 AM      Component Value Range Comment   ABO/RH(D) A NEG      Antibody Screen NEG      Sample Expiration 03/31/2012      ABO/RH     Status: Normal   Collection Time   03/17/12 10:30 AM      Component Value Range Comment   ABO/RH(D) A NEG      No results found.  A comprehensive review of systems was negative.  Blood pressure 96/56, pulse 62, temperature 98.2 F (36.8 C), temperature source Oral, resp. rate 16, SpO2 98.00%.  The patient is awake or and oriented. She is no facial asymmetry. Her gait is nonantalgic. Reflexes are decreased but equal. Her strength is intact. Assessment/Plan Impression is that of listhesis and stenosis L3-4 and the plan is for an L3 for xlif with pedicle screw instrumentation.  Reinaldo Meeker, MD 03/19/2012, 7:29 AM

## 2012-03-19 NOTE — Op Note (Signed)
Preop diagnosis: Spondylolisthesis L3-L4 with spinal stenosis Postop diagnosis: Same Procedure: L3-4 anterolateral fusion via right retroperitoneal approach followed by L3 for pedicle screw fixation with Pathfinder percutaneous pedicle screw system Surgeon: Amaal Dimartino Assistant: Newell Coral  After being placed in the right side up lateral position and line with x-ray the appropriate fashion linear incision was made in the right flank in line with the L3-4 disc space. This was carried down to the fascia but not through it. We then made an incision posterior to this incision and with finger dissection entered the retroperitoneum. We swept soft tissue clear and then turned our finger upward to allow access into the retroperitoneum from the more flank incision. We passed our first dilator down to the L3-4 disc and tested for EMG monitoring and no abnormal readings were obtained. We then did sequential dilation through this OS muscle tested electrically and obtain no abnormal readings. We placed a retractor and secured to the table in standard fashion. We tested electrically and found no abnormal readings. We therefore placed the shim to anchor the retractor. We then opened the retractor bit more. We incised the disc a 15 blade and thoroughly cleaned out with a variety of instruments. We released the fascia on the opposite side with a small Cobb elevator. We then did sequential distraction of the disc space to 8 mm size and felt this was a good choice. We therefore chose an 8 mm x 50 mm x 18 mm graft and filled with a mixture of morselized allograft. We then irrigated copiously and then placed the slides and the disc space and impacted the graft over the slides without difficulty. Fluoroscopy showed to be in excellent position. We therefore irrigated once more and was down the retractor removed without difficulty. We then closed the 2 incisions with inverted Vicryl on the fascia subcutaneous and subcuticular tissues and  Dermabond on the skin. We then placed the patient the prone position and placed percutaneous pedicle screws. Chose entry points just lateral to the pedicle and pass the Jamshidi needle from lateral to medial direction. This was followed in AP lateral x-ray. We placed guidewires for the Jamshidi needles and then removed it is without difficulty. We then did sequential dilation through the muscle. The halter break the cortical surface then tapped with a 6.0 mm tap. We placed 6.5 mm x 50 mm screws bilaterally at all levels. Fluoroscopy showed these to be in excellent position. We then chose appropriate length rods and pass them through the [place]. With the top loading nuts on top of the screws these reduce the rods and excellent position. We then did tightening and final tightening with torque and counter torque at both levels bilaterally and final fluoroscopy showed excellent position of the screws and rods. Her gait was carried out and the wounds were then closed in multiple layers of Vicryl on the fascia subcutaneous and subcuticular tissues. Dermabond and Steri-Strips were placed on these incisions. Sterile dressing was then applied the patient was extubated and taken to recovery room in stable condition.

## 2012-03-19 NOTE — Anesthesia Preprocedure Evaluation (Signed)
Anesthesia Evaluation  Patient identified by MRN, date of birth, ID band Patient awake    Reviewed: Allergy & Precautions, H&P , NPO status , Patient's Chart, lab work & pertinent test results  History of Anesthesia Complications (+) AWARENESS UNDER ANESTHESIA  Airway Mallampati: I TM Distance: >3 FB Neck ROM: full    Dental   Pulmonary Current Smoker,          Cardiovascular Rhythm:regular Rate:Normal     Neuro/Psych    GI/Hepatic GERD-  ,  Endo/Other    Renal/GU      Musculoskeletal   Abdominal   Peds  Hematology   Anesthesia Other Findings   Reproductive/Obstetrics                           Anesthesia Physical Anesthesia Plan  ASA: II  Anesthesia Plan: General   Post-op Pain Management:    Induction: Intravenous  Airway Management Planned: Oral ETT  Additional Equipment:   Intra-op Plan:   Post-operative Plan: Extubation in OR  Informed Consent: I have reviewed the patients History and Physical, chart, labs and discussed the procedure including the risks, benefits and alternatives for the proposed anesthesia with the patient or authorized representative who has indicated his/her understanding and acceptance.     Plan Discussed with: CRNA, Anesthesiologist and Surgeon  Anesthesia Plan Comments:         Anesthesia Quick Evaluation

## 2012-03-19 NOTE — Transfer of Care (Signed)
Immediate Anesthesia Transfer of Care Note  Patient: [name]  Procedure(s) Performed: Procedure(s) (LRB): ANTERIOR LATERAL LUMBAR FUSION 1 LEVEL (Right) LUMBAR PERCUTANEOUS PEDICLE SCREW 1 LEVEL (N/A)  Patient Location: PACU  Anesthesia Type: General  Level of Consciousness: awake, alert  and oriented  Airway & Oxygen Therapy: Patient Spontanous Breathing and Patient connected to nasal cannula oxygen  Post-op Assessment: Report given to PACU RN, Post -op Vital signs reviewed and stable, Patient moving all extremities and Patient able to stick tongue midline  Post vital signs: Reviewed and stable  Complications: No apparent anesthesia complications

## 2012-03-20 ENCOUNTER — Encounter (HOSPITAL_COMMUNITY): Payer: Self-pay | Admitting: Neurosurgery

## 2012-03-20 MED ORDER — DIAZEPAM 5 MG PO TABS
10.0000 mg | ORAL_TABLET | Freq: Four times a day (QID) | ORAL | Status: DC | PRN
  Administered 2012-03-20 – 2012-03-22 (×6): 10 mg via ORAL
  Filled 2012-03-20 (×5): qty 2
  Filled 2012-03-20 (×2): qty 1

## 2012-03-20 NOTE — Progress Notes (Signed)
Patient ID: [name], female   DOB: 1965/11/02, 46 y.o.   MRN: [id number] Doing well Post op day 1. Increasing activity slowly, though complains of severe incisional pain. Wound clean and dry. Will increase activity. Home next 1-2 days.

## 2012-03-20 NOTE — Care Management Note (Signed)
    Page 1 of 2   03/31/2012     4:04:51 PM   CARE MANAGEMENT NOTE 03/31/2012  Patient:  [name], [name]   Account Number:  [id number]  Date Initiated:  03/20/2012  Documentation initiated by:  Onnie Boer  Subjective/Objective Assessment:   PT WAS ADMITTED FOR SURGERY, listhesis and stenosis L3-4     Action/Plan:   PROGRESSION OF CARE AND DISCHARGE PLANNING   Anticipated DC Date:  03/22/2012   Anticipated DC Plan:  HOME W HOME HEALTH SERVICES      DC Planning Services  CM consult      Choice offered to / List presented to:     DME arranged  3-N-1  [facility]      DME agency  [facility]        Status of service:  In process, will continue to follow Medicare Important Message given?   (If response is "NO", the following Medicare IM given date fields will be blank) Date Medicare IM given:   Date Additional Medicare IM given:    Discharge Disposition:    Per UR Regulation:  Reviewed for med. necessity/level of care/duration of stay  If discussed at Long Length of Stay Meetings, dates discussed:   03/26/2012    Comments:  03/31/12 Onnie Boer, RN, BSN 1604 PT IS TO DC TO HOME WITH IV ABX WITH [facility].  03/28/2012 1530  Contacted [facility] for DME for scheduled d/c home today. Unit RN will follow up with MD for possible [facility] PT and [facility] RN. Isidoro Donning RN CCM Case Mgmt phone [phone]  03/27/12 Onnie Boer, RN, BSN 1639 PT CONTS WITH LEAKAGE OF HER INCISION SITE.  PT NOW WITH COMPLAINTS OF ABD PAIN.  WILL F/U.  03/24/12 Onnie Boer, RN, BSN 1600 PT HAS NG CLAMPED AND IS STARTING LIQUIDS.  PT SHOULD DC TO HOME WITH SELF CARE SOON.  03/20/12 Onnie Boer, RN, BSN 1441 PT WAS ADMITTED FOR BACK SURGERY.  PTA PT WAS AT HOME WITH SELF.  WILL F/U ON DC NEEDS AND RECOMMENDATIONS.

## 2012-03-21 MED ORDER — SENNOSIDES-DOCUSATE SODIUM 8.6-50 MG PO TABS: 1.0000 | ORAL_TABLET | Freq: Every evening | ORAL | Status: DC | PRN

## 2012-03-21 MED ORDER — PROCHLORPERAZINE 25 MG RE SUPP
25.0000 mg | Freq: Two times a day (BID) | RECTAL | Status: DC | PRN
  Administered 2012-03-21: 25 mg via RECTAL
  Filled 2012-03-21 (×3): qty 1

## 2012-03-21 MED ORDER — CALCIUM CARBONATE ANTACID 500 MG PO CHEW
1.0000 | CHEWABLE_TABLET | Freq: Three times a day (TID) | ORAL | Status: AC
  Administered 2012-03-21: 200 mg via ORAL
  Filled 2012-03-21 (×11): qty 1

## 2012-03-21 MED ORDER — FLEET ENEMA 7-19 GM/118ML RE ENEM
1.0000 | ENEMA | Freq: Once | RECTAL | Status: AC
  Administered 2012-03-21: 1 via RECTAL
  Filled 2012-03-21: qty 1

## 2012-03-21 NOTE — Progress Notes (Signed)
Subjective: Patient reports A lot of nausea spine she has not passing gas  Objective: Vital signs in last 24 hours: Temp:  [97.7 F (36.5 C)-98.5 F (36.9 C)] 97.9 F (36.6 C) (08/24 0558) Pulse Rate:  [60-83] 83  (08/24 0558) Resp:  [18-20] 18  (08/24 0558) BP: (102-124)/(57-72) 124/72 mmHg (08/24 0558) SpO2:  [95 %-98 %] 96 % (08/24 0558)  Intake/Output from previous day:   Intake/Output this shift:    Strength out of 5 wound clean and dry  Lab Results: No results found for this basename: WBC:2,HGB:2,HCT:2,PLT:2 in the last 72 hours BMET No results found for this basename: NA:2,K:2,CL:2,CO2:2,GLUCOSE:2,BUN:2,CREATININE:2,CALCIUM:2 in the last 72 hours  Studies/Results: Dg Lumbar Spine 2-3 Views  03/19/2012  *RADIOLOGY REPORT*  Clinical Data: Posterior fusion.  LUMBAR SPINE - 2-3 VIEW  Comparison: MRI 02/14/2012  Findings: Two intraoperative spot images demonstrate single level fusion at L3-4.  Normal alignment.  No hardware complicating feature.  IMPRESSION: L3-4 posterior fusion.  No complicating feature visualized on these intraoperative spot images.   Original Report Authenticated By: Cyndie Chime, M.D.     Assessment/Plan: Work on she may have postoperative ileus scale back her by mouth intake clears mobilize with physical therapy continue to her nausea  LOS: 2 days     Yashua Bracco P 03/21/2012, 8:41 AM

## 2012-03-22 MED ORDER — DIAZEPAM 5 MG/ML IJ SOLN
5.0000 mg | Freq: Four times a day (QID) | INTRAMUSCULAR | Status: DC | PRN
  Administered 2012-03-22 – 2012-03-27 (×11): 5 mg via INTRAVENOUS
  Filled 2012-03-22 (×11): qty 2

## 2012-03-22 MED ORDER — MORPHINE SULFATE 2 MG/ML IJ SOLN
2.0000 mg | INTRAMUSCULAR | Status: DC | PRN
  Administered 2012-03-22 – 2012-03-23 (×3): 4 mg via INTRAVENOUS
  Filled 2012-03-22 (×3): qty 2

## 2012-03-22 NOTE — Progress Notes (Signed)
Call to Dr. Franky Macho, NG tube placement ordered for distended stomach and vomiting dark brown emesis.  NG tube to right nare, to intermittent suction draining dark brown liquid.  IV started for hydration. See fluids as ordered. Olevia Perches

## 2012-03-22 NOTE — Progress Notes (Signed)
Patient ID: [name], female   DOB: [birth year], 46 y.o.   MRN: [id number] BP 139/73  Pulse 126  Temp 98 F (36.7 C) (Oral)  Resp 20  Ht 5\' 4"  (1.626 m)  Wt 113.399 kg (250 lb)  BMI 42.91 kg/m2  SpO2 96% Alert and oriented x 4 NG tube in place, good deal of drainage Complains of a great deal of pain in right lower extremity Strength however is full, able to walk Cont ng and npo.

## 2012-03-22 NOTE — Progress Notes (Signed)
Patient complaining of a lot of gas and feeling bloated, unable to move bowels. Call to Dr. Franky Macho, Tums ordered, Fleets enema ordered. Enema given without any results.  Will address need for KUB to rule out ilieus. Olevia Perches

## 2012-03-23 LAB — COMPREHENSIVE METABOLIC PANEL
Albumin: 2.9 g/dL — ABNORMAL LOW (ref 3.5–5.2)
BUN: 13 mg/dL (ref 6–23)
Creatinine, Ser: 0.58 mg/dL (ref 0.50–1.10)
GFR calc Af Amer: >90 mL/min (ref >90)
Glucose, Bld: 139 mg/dL — ABNORMAL HIGH (ref 70–99)
Total Bilirubin: 0.4 mg/dL (ref 0.3–1.2)
Total Protein: 6.4 g/dL (ref 6.0–8.3)

## 2012-03-23 LAB — CBC WITH DIFFERENTIAL/PLATELET
Basophils Relative: 0 % (ref 0–1)
Eosinophils Absolute: 0.1 10*3/uL (ref 0.0–0.7)
HCT: 38.8 % (ref 36.0–46.0)
Hemoglobin: 13 g/dL (ref 12.0–15.0)
Lymphs Abs: 2.1 10*3/uL (ref 0.7–4.0)
MCH: 31.9 pg (ref 26.0–34.0)
MCHC: 33.5 g/dL (ref 30.0–36.0)
MCV: 95.3 fL (ref 78.0–100.0)
Monocytes Absolute: 1.7 10*3/uL — ABNORMAL HIGH (ref 0.1–1.0)
Monocytes Relative: 15 % — ABNORMAL HIGH (ref 3–12)

## 2012-03-23 NOTE — Progress Notes (Signed)
Noted change in color of drainage in NG tube from dark brown to red tinged with small blood clot. Suction removed and NG clamped. Call to DR. Cram, ordered CBC and CMP. Order to keep NG clamped. Will continue to monitor. Olevia Perches

## 2012-03-23 NOTE — Progress Notes (Signed)
Patient ID: [name], female   DOB: 1966-04-30, 46 y.o.   MRN: [id number] Subjective: Patient reports anxious to go home. Pain decreasing. Objective: Vital signs in last 24 hours: Temp:  [97.4 F (36.3 C)-98.7 F (37.1 C)] 98.5 F (36.9 C) (08/26 0546) Pulse Rate:  [93-126] 93  (08/26 0546) Resp:  [18-20] 20  (08/26 0546) BP: (119-143)/(66-82) 119/66 mmHg (08/26 0546) SpO2:  [92 %-96 %] 92 % (08/26 0546)  Intake/Output from previous day: 08/25 0701 - 08/26 0700 In: 7178 [P.O.:120; I.V.:7028; NG/GT:30] Out: 745 [Emesis/NG output:745] Intake/Output this shift:    belly soft, but hypoactive bowel sounds  Lab Results:  Basename 03/23/12 0630  WBC 11.3*  HGB 13.0  HCT 38.8  PLT 322   BMET  Basename 03/23/12 0630  NA 133*  K 4.1  CL 97  CO2 28  GLUCOSE 139*  BUN 13  CREATININE 0.58  CALCIUM 8.9    Studies/Results: No results found.  Assessment/Plan: Ileus still present. No flatus. Will keep on suction for now, and once starts passing gas will clamp ngt.   LOS: 4 days  as above   Reinaldo Meeker, MD 03/23/2012, 9:44 AM

## 2012-03-24 NOTE — Progress Notes (Signed)
Patient ID: [name], female   DOB: 04-16-1966, 46 y.o.   MRN: [id number] Subjective: Patient reports + flatus  Objective: Vital signs in last 24 hours: Temp:  [98.1 F (36.7 C)-99.1 F (37.3 C)] 98.9 F (37.2 C) (08/27 1046) Pulse Rate:  [91-100] 91  (08/27 1046) Resp:  [18] 18  (08/27 1046) BP: (103-139)/(64-80) 139/80 mmHg (08/27 1046) SpO2:  [94 %-100 %] 96 % (08/27 1046)  Intake/Output from previous day:   Intake/Output this shift:    neuro intact. Abdomen non tender  Lab Results:  Basename 03/23/12 0630  WBC 11.3*  HGB 13.0  HCT 38.8  PLT 322   BMET  Basename 03/23/12 0630  NA 133*  K 4.1  CL 97  CO2 28  GLUCOSE 139*  BUN 13  CREATININE 0.58  CALCIUM 8.9    Studies/Results: No results found.  Assessment/Plan: Doing well. Will clamp NGT now that passing gas and start some liquids. Will d/c tube later if feeling ok.  LOS: 5 days  as above   Reinaldo Meeker, MD 03/24/2012, 11:25 AM

## 2012-03-25 ENCOUNTER — Encounter (HOSPITAL_COMMUNITY): Payer: Self-pay

## 2012-03-25 MED ORDER — MAGNESIUM HYDROXIDE 400 MG/5ML PO SUSP
30.0000 mL | Freq: Every day | ORAL | Status: DC | PRN
  Administered 2012-03-25 – 2012-03-29 (×3): 30 mL via ORAL
  Filled 2012-03-25 (×2): qty 30
  Filled 2012-03-25: qty 60

## 2012-03-25 MED ORDER — FLEET ENEMA 7-19 GM/118ML RE ENEM
1.0000 | ENEMA | Freq: Once | RECTAL | Status: AC
  Administered 2012-03-25: 1 via RECTAL
  Filled 2012-03-25 (×2): qty 1

## 2012-03-25 MED ORDER — DOCUSATE SODIUM 100 MG PO CAPS
100.0000 mg | ORAL_CAPSULE | Freq: Two times a day (BID) | ORAL | Status: DC
  Administered 2012-03-26 – 2012-03-31 (×11): 100 mg via ORAL
  Filled 2012-03-25 (×11): qty 1

## 2012-03-25 MED ORDER — OXYCODONE HCL 5 MG PO TABS
15.0000 mg | ORAL_TABLET | ORAL | Status: DC | PRN
  Administered 2012-03-25 – 2012-03-31 (×16): 15 mg via ORAL
  Filled 2012-03-25 (×5): qty 3
  Filled 2012-03-25: qty 15
  Filled 2012-03-25 (×8): qty 3
  Filled 2012-03-25: qty 1
  Filled 2012-03-25 (×4): qty 3
  Filled 2012-03-25: qty 2

## 2012-03-25 MED ORDER — BISACODYL 10 MG RE SUPP
10.0000 mg | Freq: Every day | RECTAL | Status: DC | PRN
  Administered 2012-03-25: 10 mg via RECTAL
  Filled 2012-03-25: qty 1

## 2012-03-25 NOTE — Progress Notes (Signed)
Pt complained of upset stomach, constipation after suppository. Pt received fleets enema and milk of mag. Pt bowels now regulating appropriately. Pt under no s/s distress. Had about 3-4 bowel movements.

## 2012-03-25 NOTE — Progress Notes (Addendum)
Pt had moderate amount of drainage from incision this am (approximately 830), clear, light brown, pink stained.  Dressing reinforced.. MD made aware.

## 2012-03-25 NOTE — Progress Notes (Signed)
Patient ID: [name], female   DOB: 03/25/1966, 46 y.o.   MRN: [id number] Subjective: Patient reports lots of incisional pain  Objective: Vital signs in last 24 hours: Temp:  [97.4 F (36.3 C)-98.9 F (37.2 C)] 97.4 F (36.3 C) (08/28 0500) Pulse Rate:  [86-91] 86  (08/28 0500) Resp:  [16-81] 81  (08/28 0500) BP: (132-139)/(40-80) 132/40 mmHg (08/28 0500) SpO2:  [96 %-100 %] 100 % (08/28 0500)  Intake/Output from previous day:   Intake/Output this shift:    Wound:some sero sanguinous drainage from back incisions  Lab Results:  Basename 03/23/12 0630  WBC 11.3*  HGB 13.0  HCT 38.8  PLT 322   BMET  Basename 03/23/12 0630  NA 133*  K 4.1  CL 97  CO2 28  GLUCOSE 139*  BUN 13  CREATININE 0.58  CALCIUM 8.9    Studies/Results: No results found.  Assessment/Plan: Doing fair. Legs feel pretty good, but a lot more incisional pain than usual for this procedure. Will do our best to control it. Will use laxatives for her constipation.  LOS: 6 days  as above   Reinaldo Meeker, MD 03/25/2012, 9:55 AM

## 2012-03-25 NOTE — Progress Notes (Signed)
Dressing saturated with light brown, bloody drainage. Changed dressing. Pt under no s/s distress.

## 2012-03-26 MED ORDER — CEFAZOLIN SODIUM-DEXTROSE 2-3 GM-% IV SOLR
2.0000 g | Freq: Three times a day (TID) | INTRAVENOUS | Status: DC
  Administered 2012-03-26 – 2012-03-31 (×16): 2 g via INTRAVENOUS
  Filled 2012-03-26 (×19): qty 50

## 2012-03-26 NOTE — Progress Notes (Signed)
Pt steri strips replaced on one posterior incision by MD. Dressing applied over incision . Pt under no s/s distress.

## 2012-03-26 NOTE — Progress Notes (Signed)
Patient ID: [name], female   DOB: August 17, 1965, 46 y.o.   MRN: [id number] Subjective: Patient reports feeling better with BM  Objective: Vital signs in last 24 hours: Temp:  [98.1 F (36.7 C)-98.8 F (37.1 C)] 98.6 F (37 C) (08/29 0600) Pulse Rate:  [87-99] 88  (08/29 0600) Resp:  [16-18] 18  (08/29 0600) BP: (114-129)/(65-75) 117/73 mmHg (08/29 0600) SpO2:  [90 %-97 %] 90 % (08/29 0600)  Intake/Output from previous day:   Intake/Output this shift:    Wound:Some drainage from one of the back incisions. I removed the steri strips and placed new ones. It does not look infected, but just has not healed together yet. Will use a few doses of IV ancef as well.  Lab Results: No results found for this basename: WBC:2,HGB:2,HCT:2,PLT:2 in the last 72 hours BMET No results found for this basename: NA:2,K:2,CL:2,CO2:2,GLUCOSE:2,BUN:2,CREATININE:2,CALCIUM:2 in the last 72 hours  Studies/Results: No results found.  Assessment/Plan: As above. Will continue present rx.  LOS: 7 days  as above   Reinaldo Meeker, MD 03/26/2012, 12:05 PM

## 2012-03-26 NOTE — Progress Notes (Signed)
Pt dressing changed by charge nurse. Dressing was saturated. Pt under no s/s distress.

## 2012-03-27 NOTE — Consult Note (Signed)
WOC consult Note Reason for Consult: eval. Non healing surgical wound.  Pt s/p back surgery 03/19/12. Has some tan-yellow drainage from left lateral lower back wound. Steristrips removed all but two by Careers adviser.   Wound type: early necrosis at surgical site.  Measurement: 4cm x 0.5cm x 0.2cm Wound bed: yellow slough Drainage (amount, consistency, odor) tan-yellow, slight odor Periwound:slight erythema consistent with inflammatory phase of wound healing Dressing procedure/placement/frequency: will order absorbant antimicrobial dressing,changed daily.  This will absorb and wick excess exudate from the wound and provide antimicrobial support for the wound.  I was not able to probe the wound to open and drain at the time of my assessment today.   WOC will follow up Monday to see status of wound Amaira Safley [employer], [employer]

## 2012-03-27 NOTE — Progress Notes (Signed)
Patient ID: HAANI BAKULA, female   DOB: [birth year], 46 y.o.   MRN: [id number] Subjective: Patient reports + flatus  Objective: Vital signs in last 24 hours: Temp:  [97.7 F (36.5 C)-98.3 F (36.8 C)] 98 F (36.7 C) (08/30 0600) Pulse Rate:  [87-109] 87  (08/30 0600) Resp:  [17-18] 18  (08/30 0600) BP: (112-132)/(52-77) 122/61 mmHg (08/30 0600) SpO2:  [95 %-98 %] 95 % (08/30 0600)  Intake/Output from previous day:   Intake/Output this shift:    Wound:still with drainage from one of the back incisions.   Lab Results: No results found for this basename: WBC:2,HGB:2,HCT:2,PLT:2 in the last 72 hours BMET No results found for this basename: NA:2,K:2,CL:2,CO2:2,GLUCOSE:2,BUN:2,CREATININE:2,CALCIUM:2 in the last 72 hours  Studies/Results: No results found.  Assessment/Plan: Probable superficial infection of one incision, will get wound care nurse to assess  LOS: 8 days  as above   Reinaldo Meeker, MD 03/27/2012, 9:34 AM

## 2012-03-27 NOTE — Progress Notes (Signed)
Pt dressing redressed this am with gauze. Pt under no s/s distress.

## 2012-03-28 MED ORDER — DIAZEPAM 5 MG PO TABS
5.0000 mg | ORAL_TABLET | Freq: Four times a day (QID) | ORAL | Status: DC | PRN
  Administered 2012-03-28 – 2012-03-31 (×8): 5 mg via ORAL
  Filled 2012-03-28 (×8): qty 1

## 2012-03-28 NOTE — Progress Notes (Signed)
03/28/2012 1530  Contacted [facility] for DME for scheduled d/c home today. Unit RN will follow up with MD for possible [facility] PT and [facility] RN. Isidoro Donning RN CCM Case Mgmt phone [phone]

## 2012-03-28 NOTE — Evaluation (Signed)
Physical Therapy Evaluation Patient Details Name: [name] MRN: [id number] DOB: May 11, 1966 Today's Date: 03/28/2012 Time: 4098-1191 PT Time Calculation (min): 22 min  PT Assessment / Plan / Recommendation Clinical Impression  pt s/p L3 anterolateral fusion complicated by post-op ileus.  patient did well with mobility and significant other was present to assist/learn.  Patient educated up/down steps and anticipates d/c in morning.  Pt will benefit from continued PT in hospital or at home to progress ambulation and advance to cane or no assistive device.      PT Assessment  Patient needs continued PT services    Follow Up Recommendations  Home health PT    Barriers to Discharge        Equipment Recommendations  Rolling walker with 5" wheels (elevated toilet seat)    Recommendations for Other Services     Frequency Min 5X/week    Precautions / Restrictions Precautions Precautions: Back   Pertinent Vitals/Pain Patient with 5/10 during mobility      Mobility  Bed Mobility Bed Mobility: Rolling Left;Left Sidelying to Sit Rolling Left: 6: Modified independent (Device/Increase time);With rail Left Sidelying to Sit: 6: Modified independent (Device/Increase time);With rails;HOB flat Details for Bed Mobility Assistance: patient lying in bed with trunk twisted - reviewed back precautions and need to lie straight. Transfers Transfers: Sit to Stand;Stand to Sit Sit to Stand: 5: Supervision;From elevated surface;With upper extremity assist;From bed Stand to Sit: 5: Supervision;With upper extremity assist;To bed Details for Transfer Assistance: supervision for safety secondary to pain; verbal cues for hand placement Ambulation/Gait Ambulation/Gait Assistance: 6: Modified independent (Device/Increase time) Ambulation Distance (Feet): 150 Feet Assistive device: Rolling walker Ambulation/Gait Assistance Details: patient required increased time Gait Pattern: Step-through  pattern Gait velocity: decreased Stairs: Yes Stairs Assistance: 5: Supervision Stairs Assistance Details (indicate cue type and reason): education for technique; boyfriend present Stair Management Technique: Two rails;Forwards Number of Stairs: 3     Exercises     PT Diagnosis: Difficulty walking;Acute pain  PT Problem List: Decreased activity tolerance;Decreased mobility;Decreased knowledge of use of DME PT Treatment Interventions: DME instruction;Gait training;Functional mobility training;Therapeutic activities   PT Goals Acute Rehab PT Goals PT Goal Formulation: With patient Time For Goal Achievement: 04/04/12 Potential to Achieve Goals: Good Pt will go Sit to Stand: with upper extremity assist;with modified independence PT Goal: Sit to Stand - Progress: Goal set today Pt will go Stand to Sit: with modified independence;with upper extremity assist PT Goal: Stand to Sit - Progress: Goal set today Pt will Ambulate: >150 feet;with modified independence;with least restrictive assistive device PT Goal: Ambulate - Progress: Goal set today  Visit Information  Last PT Received On: 03/28/12 Assistance Needed: +1    Subjective Data  Subjective: patient reports she is nervous about going up/down stairs Patient Stated Goal: hopefully go home tomorrow   Prior Functioning  Home Living Lives With: Family Available Help at Discharge: Family Type of Home: Mobile home Home Access: Stairs to enter Secretary/administrator of Steps: 4 Entrance Stairs-Rails: Can reach both Home Layout: One level Firefighter: Standard Home Adaptive Equipment: None Prior Function Level of Independence: Independent Able to Take Stairs?: Yes Driving: Yes Vocation: On disability Communication Communication: No difficulties    Cognition  Overall Cognitive Status: Appears within functional limits for tasks assessed/performed Arousal/Alertness: Awake/alert Orientation Level: Appears intact for tasks  assessed Behavior During Session: [facility] for tasks performed    Extremity/Trunk Assessment Right Upper Extremity Assessment RUE ROM/Strength/Tone: [facility] for tasks assessed Left Upper Extremity Assessment  LUE ROM/Strength/Tone: [facility] for tasks assessed Right Lower Extremity Assessment RLE ROM/Strength/Tone: [facility] for tasks assessed Left Lower Extremity Assessment LLE ROM/Strength/Tone: [facility] for tasks assessed   Balance Balance Balance Assessed:  (no loss of balance)  End of Session PT - End of Session Equipment Utilized During Treatment: Back brace Activity Tolerance: Patient tolerated treatment well Patient left: in bed;with call bell/phone within reach;with family/visitor present  GP     Olivia Canter, [place] [phone] 03/28/2012, 3:14 PM

## 2012-03-28 NOTE — Progress Notes (Signed)
Subjective: Patient reports less drainage from wound  Objective: Vital signs in last 24 hours: Temp:  [97.7 F (36.5 C)-98.9 F (37.2 C)] 97.7 F (36.5 C) (08/31 0953) Pulse Rate:  [85-100] 85  (08/31 0953) Resp:  [18] 18  (08/31 0953) BP: (90-141)/(56-67) 141/67 mmHg (08/31 0953) SpO2:  [95 %-98 %] 95 % (08/31 0953)  Intake/Output from previous day: 08/30 0701 - 08/31 0700 In: 340 [P.O.:240; IV Piggyback:100] Out: -  Intake/Output this shift:    Physical Exam: Has been up to shower, dressings recently changed, currently dressed.  Lab Results: No results found for this basename: WBC:2,HGB:2,HCT:2,PLT:2 in the last 72 hours BMET No results found for this basename: NA:2,K:2,CL:2,CO2:2,GLUCOSE:2,BUN:2,CREATININE:2,CALCIUM:2 in the last 72 hours  Studies/Results: No results found.  Assessment/Plan: Will continue dressing changes and D/C home when wound healing appears to be progressing.  Will re-initiate PT while an inpatient.    LOS: 9 days    Dorian Heckle, MD 03/28/2012, 11:19 AM

## 2012-03-29 MED ORDER — FLEET ENEMA 7-19 GM/118ML RE ENEM
1.0000 | ENEMA | Freq: Once | RECTAL | Status: AC
  Administered 2012-03-29: 1 via RECTAL

## 2012-03-29 NOTE — Progress Notes (Signed)
Physical Therapy Treatment Patient Details Name: PRESTINA RAIGOZA MRN: [id number] DOB: [birth year] Today's Date: 03/29/2012 Time: 6578-4696 PT Time Calculation (min): 20 min  PT Assessment / Plan / Recommendation Comments on Treatment Session  Patient moving very well.  Reports she feels comfortable with stairs - declines practicing today.    Follow Up Recommendations  Home health PT    Barriers to Discharge        Equipment Recommendations  Rolling walker with 5" wheels    Recommendations for Other Services    Frequency Min 5X/week   Plan Discharge plan remains appropriate;Frequency remains appropriate    Precautions / Restrictions Precautions Precautions: Back Precaution Comments: Patient able to state 3/3 back precautions Required Braces or Orthoses: Spinal Brace Spinal Brace: Lumbar corset;Applied in sitting position Restrictions Weight Bearing Restrictions: No       Mobility  Bed Mobility Bed Mobility: Sit to Sidelying Left Sit to Sidelying Left: 6: Modified independent (Device/Increase time);With rail;HOB flat Details for Bed Mobility Assistance: Cues for technique Transfers Transfers: Sit to Stand;Stand to Sit Sit to Stand: 5: Supervision;With upper extremity assist;From bed Stand to Sit: 5: Supervision;With upper extremity assist;To bed Details for Transfer Assistance: supervision for safety only.  Patient uses correct technique Ambulation/Gait Ambulation/Gait Assistance: 6: Modified independent (Device/Increase time) Ambulation Distance (Feet): 200 Feet Assistive device: Rolling walker Ambulation/Gait Assistance Details: Verbal cues for posture.  Patient safe with use of RW Gait Pattern: Step-through pattern Gait velocity: decreased     PT Goals Acute Rehab PT Goals PT Goal: Sit to Stand - Progress: Progressing toward goal PT Goal: Stand to Sit - Progress: Progressing toward goal PT Goal: Ambulate - Progress: Met  Visit Information  Last PT Received On:  03/29/12 Assistance Needed: +1    Subjective Data  Subjective: Patient concerned about not working and income.   Cognition  Overall Cognitive Status: Appears within functional limits for tasks assessed/performed Arousal/Alertness: Awake/alert Orientation Level: Appears intact for tasks assessed Behavior During Session: [facility] for tasks performed    Balance     End of Session PT - End of Session Equipment Utilized During Treatment: Gait belt;Back brace Activity Tolerance: Patient tolerated treatment well Patient left: in bed;with call bell/phone within reach;with family/visitor present Nurse Communication: Mobility status   GP     Vena Austria 03/29/2012, 2:39 PM Durenda Hurt. Renaldo Fiddler, [facility] Acute Rehab Services Pager [phone]

## 2012-03-29 NOTE — Progress Notes (Signed)
Wound : Pt still having  some moderate amount of purulent drainage from one of the  back  Incision  .   MD or Wound care nurse to reassess and address.Dressing is being  changed at the least twice every 12hr shift as per wound care protocol. Noted some foul smell during drgs change this am.

## 2012-03-29 NOTE — Progress Notes (Signed)
Subjective: Patient reports in shower  Objective: Vital signs in last 24 hours: Temp:  [97.5 F (36.4 C)-100.2 F (37.9 C)] 97.8 F (36.6 C) (09/01 0922) Pulse Rate:  [83-112] 92  (09/01 0922) Resp:  [18-19] 19  (09/01 0922) BP: (103-157)/(58-90) 130/77 mmHg (09/01 0922) SpO2:  [93 %-97 %] 97 % (09/01 0922)  Intake/Output from previous day: 08/31 0701 - 09/01 0700 In: 780 [P.O.:480; IV Piggyback:300] Out: -  Intake/Output this shift:    Physical Exam: Still with purulent drainage from wound.    Lab Results: No results found for this basename: WBC:2,HGB:2,HCT:2,PLT:2 in the last 72 hours BMET No results found for this basename: NA:2,K:2,CL:2,CO2:2,GLUCOSE:2,BUN:2,CREATININE:2,CALCIUM:2 in the last 72 hours  Studies/Results: No results found.  Assessment/Plan: Continue with dressing changes.    LOS: 10 days    Dorian Heckle, MD 03/29/2012, 11:13 AM

## 2012-03-30 ENCOUNTER — Inpatient Hospital Stay (HOSPITAL_COMMUNITY): Payer: Medicaid Other

## 2012-03-30 NOTE — Progress Notes (Signed)
Physical Therapy Treatment and Discharge  Patient Details Name: [name] MRN: [id number] DOB: June 11, 1966 Today's Date: 03/30/2012 Time: 4010-2725 PT Time Calculation (min): 21 min  PT Assessment / Plan / Recommendation Comments on Treatment Session  Pt has no further acute PT needs.     Follow Up Recommendations  No PT follow up    Barriers to Discharge        Equipment Recommendations  Rolling walker with 5" wheels    Recommendations for Other Services    Frequency Min 4X/week   Plan All goals met and education completed, patient dischaged from PT services    Precautions / Restrictions Precautions Precautions: Back Precaution Comments: Patient able to state 3/3 back precautions Required Braces or Orthoses: Spinal Brace Spinal Brace: Lumbar corset;Applied in sitting position Restrictions Weight Bearing Restrictions: No   Pertinent Vitals/Pain No c/o pain    Mobility  Bed Mobility Bed Mobility: Rolling Left;Left Sidelying to Sit;Sit to Sidelying Left Rolling Left: 7: Independent Left Sidelying to Sit: 7: Independent Sit to Sidelying Left: 7: Independent Details for Bed Mobility Assistance: Pt demonstrate proper log roll technique.  Transfers Transfers: Sit to Stand;Stand to Sit Sit to Stand: 7: Independent;From bed Stand to Sit: 7: Independent;To bed Ambulation/Gait Ambulation/Gait Assistance: 6: Modified independent (Device/Increase time);5: Supervision Ambulation Distance (Feet): 150 Feet Assistive device: Rolling walker;None Ambulation/Gait Assistance Details: Pt ambulated 25 feet with no AD. Pt was unsteady and did not "feel" safe.  Pt ambulated 150 with RW with modified Independence Gait Pattern: Within Functional Limits Stairs: No    Exercises     PT Diagnosis:    PT Problem List:   PT Treatment Interventions:     PT Goals Acute Rehab PT Goals PT Goal Formulation: With patient Time For Goal Achievement: 04/04/12 Potential to Achieve Goals:  Good Pt will go Sit to Stand: with upper extremity assist;with modified independence PT Goal: Sit to Stand - Progress: Met Pt will go Stand to Sit: with modified independence;with upper extremity assist PT Goal: Stand to Sit - Progress: Met Pt will Ambulate: >150 feet;with modified independence;with least restrictive assistive device PT Goal: Ambulate - Progress: Met  Visit Information  Last PT Received On: 03/30/12 Assistance Needed: +1    Subjective Data  Subjective: I have been having too much drainage.  Patient Stated Goal: hopefully go home tomorrow   Cognition  Overall Cognitive Status: Appears within functional limits for tasks assessed/performed Arousal/Alertness: Awake/alert Orientation Level: Appears intact for tasks assessed Behavior During Session: [facility] for tasks performed    Balance     End of Session PT - End of Session Equipment Utilized During Treatment: Gait belt;Back brace Activity Tolerance: Patient tolerated treatment well Patient left: in bed;with call bell/phone within reach;with family/visitor present Nurse Communication: Mobility status   GP     Kalin Kyler 03/30/2012, 4:35 PM Darrold Bezek L. Chanetta Moosman DPT [phone]

## 2012-03-30 NOTE — Progress Notes (Signed)
Filed Vitals:   03/29/12 2200 03/30/12 0200 03/30/12 0600 03/30/12 0958  BP: 122/65 143/89 90/57 114/67  Pulse: 89 92 84 84  Temp: 97.7 F (36.5 C) 98.1 F (36.7 C) 98.3 F (36.8 C) 98.6 F (37 C)  TempSrc: Oral Oral Oral Oral  Resp: 18 19 18 20   Height:      Weight:      SpO2: 97% 99% 97% 95%    Patient resting in bed. Comfortable. Continuing on Ancef. Continuing dressing changes. Current dressing taking down, had been placed 3 hours ago, and has moderate serosanguineous drainage. No purulence noted on dressing nor from wound, no erythema around the wound. Complaining of constipation.  Plan: Have asked the nurse to replace dressing, and continue to work with patient on relieving constipation (a number of different measures already been tried).  Hewitt Shorts, MD 03/30/2012, 11:07 AM

## 2012-03-31 MED ORDER — HYDROMORPHONE HCL 4 MG PO TABS: 4.0000 mg | ORAL_TABLET | ORAL | Status: AC | PRN

## 2012-03-31 MED ORDER — DEXTROSE 5 % IV SOLN: 2.0000 g | Freq: Three times a day (TID) | INTRAVENOUS | Status: DC

## 2012-03-31 MED ORDER — DIAZEPAM 5 MG PO TABS: 5.0000 mg | ORAL_TABLET | Freq: Four times a day (QID) | ORAL | Status: AC | PRN

## 2012-03-31 MED ORDER — HEPARIN SOD (PORK) LOCK FLUSH 100 UNIT/ML IV SOLN
250.0000 [IU] | INTRAVENOUS | Status: AC | PRN
  Administered 2012-03-31: 250 [IU]

## 2012-03-31 MED ORDER — SODIUM CHLORIDE 0.9 % IJ SOLN
10.0000 mL | INTRAMUSCULAR | Status: DC | PRN
  Administered 2012-03-31: 10 mL

## 2012-03-31 NOTE — Clinical Social Work Psychosocial (Signed)
     Clinical Social Work Department BRIEF PSYCHOSOCIAL ASSESSMENT 03/31/2012  Patient:  [name], [name]     Account Number:  [id number]     Admit date:  03/19/2012  Clinical Social Worker:  Peggyann Shoals  Date/Time:  03/31/2012 02:10 PM  Referred by:  RN  Date Referred:  03/31/2012 Referred for  Other - See comment   Other Referral:   Pt request.   Interview type:  Patient Other interview type:    PSYCHOSOCIAL DATA Living Status:  FAMILY Admitted from facility:   Level of care:   Primary support name:  Nellie Bean Primary support relationship to patient:  PARENT Degree of support available:   adequate    CURRENT CONCERNS Current Concerns  Financial Resources   Other Concerns:    SOCIAL WORK ASSESSMENT / PLAN CSW recieved referral from RN that pt requested to speak with CSW to inquire about additional financial resources. CSW met with pt to address consult. CSW introduced herself and explained role of social work. Pt shared that lives at home with her mother and daughter. Pt stated that she is employed, however is not working currently. Pt stated that she has recieved unemployement in the past. Pt reported that recieves "food stamps" and Medicaid. Pt reported that she has gone to [facility] DSS for assistance, but was told that she did not qualify. Pt stated that she has applied for disability, but that could take years. Pt inquired about short term disability, in which CSW directed pt to her employers HR department. CSW provided psychosocial support. CSW also provided resources in pt's county and problem solved aroud accessing emergency funding. Pt is to discharge home. CSW is signing off as no further needs identified. Please reconsult if a need arises prior to discharge.   Assessment/plan status:  No Further Intervention Required Other assessment/ plan:   Information/referral to community resources:   Economist in [place].     PATIENTS/FAMILYS RESPONSE TO PLAN OF CARE: Pt was alert and oriented. Pt was thankful for CSW intervention.

## 2012-03-31 NOTE — Discharge Summary (Signed)
Physician Discharge Summary  Patient ID: [name] MRN: [id number] DOB/AGE: Jul 06, 1966 46 y.o.  Admit date: 03/19/2012 Discharge date: 03/31/2012  Admission Diagnoses:  Discharge Diagnoses:  Active Problems:  * No active hospital problems. *    Discharged Condition: good  Hospital Course: Had xlif 12 days ago. Did well, and was increasing activity. She then developed an ileus that slowed down her recovery. Eventually that resolved, and she was able to then advance activity. She was then doing well until one of the back incisions got superficially infected. She was treated by the wound care team and also got IV antibiotics. The wound began to look much better, so a PICC line was placed and home health was arranged for 10 more days of ancef, 2 grams IV q 8 h.  Consults: wound care nurse  Significant Diagnostic Studies: none  Treatments: L34 xlif  Discharge Exam: Blood pressure 114/75, pulse 87, temperature 97.3 F (36.3 C), temperature source Oral, resp. rate 20, height 5\' 4"  (1.626 m), weight 113.399 kg (250 lb), SpO2 97.00%. Incision/Wound:healing well with much less drainage.  Disposition: 01-Home or Self Care  Discharge Orders    Future Orders Please Complete By Expires   Diet general      Discharge instructions      Comments:   Mostly bedrest. Get up 9 or 10 times each day and walk for 15-20 minutes each time. Very little sitting the first week. No riding in the car until your first post op appointment. If you had neck surgery...may shower from the chest down. If you had low back surgery....you may shower with a saran wrap covering over the incision. Take your pain medicine as needed...and other medicines that you are instructed to take. Call for an appointment...[phone].   Call MD for:  temperature >100.4      Call MD for:  persistant nausea and vomiting      Call MD for:  severe uncontrolled pain      Call MD for:  redness, tenderness, or signs of infection (pain,  swelling, redness, odor or green/yellow discharge around incision site)      Call MD for:  difficulty breathing, headache or visual disturbances      Call MD for:  hives        Medication List  As of 03/31/2012  4:41 PM   STOP taking these medications         BC HEADACHE POWDER PO      meloxicam 15 MG tablet         TAKE these medications         dextrose 5 % SOLN 50 mL with ceFAZolin 1 G SOLR 2 g ivpb   Inject 2 g into the vein every 8 (eight) hours.      diazepam 5 MG tablet   Commonly known as: VALIUM   Take 1 tablet (5 mg total) by mouth every 6 (six) hours as needed.      EPINEPHrine 0.3 mg/0.3 mL Devi   Commonly known as: EPI-PEN   Inject 0.3 mg into the muscle once as needed. For bee venom      Fish Oil 1000 MG Caps   Take 1 capsule by mouth daily.      HYDROmorphone 4 MG tablet   Commonly known as: DILAUDID   Take 1 tablet (4 mg total) by mouth every 4 (four) hours as needed for pain.      pantoprazole 40 MG tablet   Commonly known as:  PROTONIX   Take 40 mg by mouth daily.             At home rest most of the time. Get up 9 or 10 times each day and take a 15 or 20 minute walk. No riding in the car and to your first postoperative appointment. If you have neck surgery you may shower from the chest down starting on the third postoperative day. If you had back surgery he may start showering on the third postoperative day with saran wrap wrapped around your incisional area 3 times. After the shower remove the saran wrap. Take pain medicine as needed and other medications as instructed. Call my office for an appointment.  SignedReinaldo Meeker, MD 03/31/2012, 4:41 PM

## 2012-03-31 NOTE — Progress Notes (Signed)
Peripherally Inserted Central Catheter/Midline Placement  The IV Nurse has discussed with the patient and/or persons authorized to consent for the patient, the purpose of this procedure and the potential benefits and risks involved with this procedure.  The benefits include less needle sticks, lab draws from the catheter and patient may be discharged home with the catheter.  Risks include, but not limited to, infection, bleeding, blood clot (thrombus formation), and puncture of an artery; nerve damage and irregular heat beat.  Alternatives to this procedure were also discussed.  PICC/Midline Placement Documentation        Lisabeth Devoid 03/31/2012, 2:54 PM Consent obtained by Vista Mink, RN, [facility]-BC

## 2012-03-31 NOTE — Consult Note (Signed)
WOC follow up Wound type: surgical lower right back incision Measurement: 3.5cm x 0.2cm x 0.2cm  Wound bed: 100% yellow slough, necrosis at incision site Drainage (amount, consistency, odor) moderate serous, no odor Periwound: intact with some mild erythema does not appear any different that at the time of my exam last week.  Dressing procedure/placement/frequency: continue silver hydrofiber dressing, cut to fit on the wound bed, top with dry gauze dressings. Explained and demonstrated dressing to pts husband, he feels he could do this dressing at home.  Would recommend changing silver dressing every other day and only changing topper dressing daily.    Re consult if needed, will not follow at this time. Thanks  Chasyn Cinque [employer], CWOCN [phone])

## 2012-03-31 NOTE — Progress Notes (Signed)
Pt and husband given D/C instructions with Rx's, both verbalized understanding. Pt was set up with home health for IV abx prior to D/C. Pt D/C'd home with PICC line. Pt D/C'd home via wheelchair @ 1820 per MD order. Rema Fendt, RN

## 2012-05-19 ENCOUNTER — Other Ambulatory Visit: Payer: Self-pay | Admitting: Neurosurgery

## 2012-05-19 DIAGNOSIS — M542 Cervicalgia: Secondary | ICD-10-CM

## 2012-05-19 DIAGNOSIS — M545 Low back pain: Secondary | ICD-10-CM

## 2012-05-24 ENCOUNTER — Ambulatory Visit
Admission: RE | Admit: 2012-05-24 | Discharge: 2012-05-24 | Disposition: A | Discharge status: Home / Self Care | Payer: Medicaid Other | Source: Ambulatory Visit | Attending: Neurosurgery | Admitting: Neurosurgery

## 2012-05-24 DIAGNOSIS — M545 Low back pain: Secondary | ICD-10-CM

## 2012-05-24 DIAGNOSIS — M542 Cervicalgia: Secondary | ICD-10-CM

## 2012-05-24 MED ORDER — GADOBENATE DIMEGLUMINE 529 MG/ML IV SOLN
20.0000 mL | Freq: Once | INTRAVENOUS | Status: AC | PRN
  Administered 2012-05-24: 20 mL via INTRAVENOUS

## 2012-05-25 ENCOUNTER — Other Ambulatory Visit (HOSPITAL_COMMUNITY): Payer: Self-pay | Admitting: *Deleted

## 2012-05-25 DIAGNOSIS — D499 Neoplasm of unspecified behavior of unspecified site: Secondary | ICD-10-CM

## 2012-05-28 ENCOUNTER — Ambulatory Visit (HOSPITAL_COMMUNITY)
Admission: RE | Admit: 2012-05-28 | Discharge: 2012-05-28 | Disposition: A | Discharge status: Home / Self Care | Payer: Medicaid Other | Source: Ambulatory Visit | Attending: *Deleted | Admitting: *Deleted

## 2012-05-28 DIAGNOSIS — D499 Neoplasm of unspecified behavior of unspecified site: Secondary | ICD-10-CM | POA: Insufficient documentation

## 2012-07-15 ENCOUNTER — Other Ambulatory Visit: Payer: Self-pay | Admitting: Neurosurgery

## 2012-08-05 ENCOUNTER — Encounter (HOSPITAL_COMMUNITY): Payer: Self-pay | Admitting: Pharmacy Technician

## 2012-08-11 ENCOUNTER — Encounter (HOSPITAL_COMMUNITY)
Admission: RE | Admit: 2012-08-11 | Discharge: 2012-08-11 | Disposition: A | Discharge status: Home / Self Care | Payer: Medicaid Other | Source: Ambulatory Visit | Attending: Neurosurgery | Admitting: Neurosurgery

## 2012-08-11 ENCOUNTER — Encounter (HOSPITAL_COMMUNITY): Payer: Self-pay

## 2012-08-11 LAB — BASIC METABOLIC PANEL
CO2: 26 mEq/L (ref 19–32)
Calcium: 9.4 mg/dL (ref 8.4–10.5)
Creatinine, Ser: 0.75 mg/dL (ref 0.50–1.10)
GFR calc non Af Amer: >90 mL/min (ref >90)
Glucose, Bld: 99 mg/dL (ref 70–99)
Sodium: 139 mEq/L (ref 135–145)

## 2012-08-11 LAB — CBC
MCH: 30.4 pg (ref 26.0–34.0)
MCV: 93 fL (ref 78.0–100.0)
Platelets: 323 10*3/uL (ref 150–400)
RBC: 4.28 MIL/uL (ref 3.87–5.11)
RDW: 14.5 % (ref 11.5–15.5)

## 2012-08-11 LAB — SURGICAL PCR SCREEN: Staphylococcus aureus: NEGATIVE

## 2012-08-11 NOTE — Pre-Procedure Instructions (Signed)
[name]  08/11/2012   Your procedure is scheduled on:  Tuesday August 18, 2012  Report to [facility] at 0530 AM.  Call this number if you have problems the morning of surgery: [phone]   Remember:   Do not eat food or drink liquids after midnight.   Take these medicines the morning of surgery with A SIP OF WATER: Prozac, and Protonix   Do not wear jewelry, make-up or nail polish.  Do not wear lotions, powders, or perfumes. You may wear deodorant.  Do not shave 48 hours prior to surgery.   Do not bring valuables to the hospital.  Contacts, dentures or bridgework may not be worn into surgery.  Leave suitcase in the car. After surgery it may be brought to your room.  For patients admitted to the hospital, checkout time is 11:00 AM the day of  discharge.   Patients discharged the day of surgery will not be allowed to drive  home.    Special Instructions: Shower using CHG 2 nights before surgery and the night before surgery.  If you shower the day of surgery use CHG.  Use special wash - you have one bottle of CHG for all showers.  You should use approximately 1/3 of the bottle for each shower.   Please read over the following fact sheets that you were given: Pain Booklet, Coughing and Deep Breathing, MRSA Information and Surgical Site Infection Prevention

## 2012-08-17 MED ORDER — CEFAZOLIN SODIUM-DEXTROSE 2-3 GM-% IV SOLR
2.0000 g | INTRAVENOUS | Status: AC
  Administered 2012-08-18: 2 g via INTRAVENOUS
  Filled 2012-08-17: qty 50

## 2012-08-18 ENCOUNTER — Ambulatory Visit (HOSPITAL_COMMUNITY): Payer: Medicaid Other | Admitting: Anesthesiology

## 2012-08-18 ENCOUNTER — Encounter (HOSPITAL_COMMUNITY): Payer: Self-pay | Admitting: *Deleted

## 2012-08-18 ENCOUNTER — Ambulatory Visit (HOSPITAL_COMMUNITY)
Admission: RE | Admit: 2012-08-18 | Discharge: 2012-08-18 | Disposition: A | Discharge status: Home / Self Care | Payer: Medicaid Other | Source: Ambulatory Visit | Attending: Neurosurgery | Admitting: Neurosurgery

## 2012-08-18 ENCOUNTER — Ambulatory Visit (HOSPITAL_COMMUNITY): Payer: Medicaid Other

## 2012-08-18 ENCOUNTER — Encounter (HOSPITAL_COMMUNITY): Payer: Self-pay | Admitting: Anesthesiology

## 2012-08-18 ENCOUNTER — Encounter (HOSPITAL_COMMUNITY)
Admission: RE | Disposition: A | Discharge status: Home / Self Care | Payer: Self-pay | Source: Ambulatory Visit | Attending: Neurosurgery

## 2012-08-18 DIAGNOSIS — M47812 Spondylosis without myelopathy or radiculopathy, cervical region: Secondary | ICD-10-CM | POA: Insufficient documentation

## 2012-08-18 DIAGNOSIS — Z01812 Encounter for preprocedural laboratory examination: Secondary | ICD-10-CM | POA: Insufficient documentation

## 2012-08-18 HISTORY — PX: ANTERIOR CERVICAL DECOMP/DISCECTOMY FUSION: SHX1161

## 2012-08-18 SURGERY — ANTERIOR CERVICAL DECOMPRESSION/DISCECTOMY FUSION 2 LEVELS
Anesthesia: General | Wound class: Clean

## 2012-08-18 MED ORDER — PROPOFOL 10 MG/ML IV BOLUS
INTRAVENOUS | Status: DC | PRN
  Administered 2012-08-18: 150 mg via INTRAVENOUS

## 2012-08-18 MED ORDER — SODIUM CHLORIDE 0.9 % IJ SOLN
3.0000 mL | Freq: Two times a day (BID) | INTRAMUSCULAR | Status: DC
  Administered 2012-08-18: 3 mL via INTRAVENOUS

## 2012-08-18 MED ORDER — ONDANSETRON HCL 4 MG/2ML IJ SOLN: 4.0000 mg | Freq: Once | INTRAMUSCULAR | Status: DC | PRN

## 2012-08-18 MED ORDER — PHENOL 1.4 % MT LIQD: 1.0000 | OROMUCOSAL | Status: DC | PRN

## 2012-08-18 MED ORDER — HYDROMORPHONE HCL PF 1 MG/ML IJ SOLN
0.2500 mg | INTRAMUSCULAR | Status: DC | PRN
  Administered 2012-08-18 (×3): 0.5 mg via INTRAVENOUS

## 2012-08-18 MED ORDER — LIDOCAINE HCL (CARDIAC) 20 MG/ML IV SOLN
INTRAVENOUS | Status: DC | PRN
  Administered 2012-08-18: 50 mg via INTRAVENOUS

## 2012-08-18 MED ORDER — ACETAMINOPHEN 325 MG PO TABS: 650.0000 mg | ORAL_TABLET | ORAL | Status: DC | PRN

## 2012-08-18 MED ORDER — BACITRACIN 50000 UNITS IM SOLR
INTRAMUSCULAR | Status: AC
  Filled 2012-08-18: qty 1

## 2012-08-18 MED ORDER — FENTANYL CITRATE 0.05 MG/ML IJ SOLN
INTRAMUSCULAR | Status: DC | PRN
  Administered 2012-08-18: 100 ug via INTRAVENOUS
  Administered 2012-08-18 (×2): 50 ug via INTRAVENOUS

## 2012-08-18 MED ORDER — SODIUM CHLORIDE 0.9 % IR SOLN
Status: DC | PRN
  Administered 2012-08-18: 07:00:00

## 2012-08-18 MED ORDER — SODIUM CHLORIDE 0.9 % IJ SOLN: 3.0000 mL | INTRAMUSCULAR | Status: DC | PRN

## 2012-08-18 MED ORDER — ONDANSETRON HCL 4 MG/2ML IJ SOLN
INTRAMUSCULAR | Status: DC | PRN
  Administered 2012-08-18: 4 mg via INTRAVENOUS

## 2012-08-18 MED ORDER — MIDAZOLAM HCL 5 MG/5ML IJ SOLN
INTRAMUSCULAR | Status: DC | PRN
  Administered 2012-08-18: 2 mg via INTRAVENOUS

## 2012-08-18 MED ORDER — GLYCOPYRROLATE 0.2 MG/ML IJ SOLN
INTRAMUSCULAR | Status: DC | PRN
  Administered 2012-08-18: .8 mg via INTRAVENOUS

## 2012-08-18 MED ORDER — VECURONIUM BROMIDE 10 MG IV SOLR
INTRAVENOUS | Status: DC | PRN
  Administered 2012-08-18: 8 mg via INTRAVENOUS

## 2012-08-18 MED ORDER — KCL IN DEXTROSE-NACL 20-5-0.45 MEQ/L-%-% IV SOLN
80.0000 mL/h | INTRAVENOUS | Status: DC
  Filled 2012-08-18 (×2): qty 1000

## 2012-08-18 MED ORDER — DEXAMETHASONE 4 MG PO TABS
4.0000 mg | ORAL_TABLET | Freq: Four times a day (QID) | ORAL | Status: AC
  Administered 2012-08-18 (×2): 4 mg via ORAL
  Filled 2012-08-18 (×2): qty 1

## 2012-08-18 MED ORDER — HEMOSTATIC AGENTS (NO CHARGE) OPTIME
TOPICAL | Status: DC | PRN
  Administered 2012-08-18 (×2): 1 via TOPICAL

## 2012-08-18 MED ORDER — HYDROCODONE-ACETAMINOPHEN 5-325 MG PO TABS
1.0000 | ORAL_TABLET | ORAL | Status: DC | PRN
  Administered 2012-08-18: 2 via ORAL

## 2012-08-18 MED ORDER — NEOSTIGMINE METHYLSULFATE 1 MG/ML IJ SOLN
INTRAMUSCULAR | Status: DC | PRN
  Administered 2012-08-18: 4 mg via INTRAVENOUS

## 2012-08-18 MED ORDER — ATROPINE SULFATE 0.4 MG/ML IJ SOLN
INTRAMUSCULAR | Status: DC | PRN
  Administered 2012-08-18: 0.4 mg via INTRAVENOUS

## 2012-08-18 MED ORDER — ARTIFICIAL TEARS OP OINT
TOPICAL_OINTMENT | OPHTHALMIC | Status: DC | PRN
  Administered 2012-08-18: 1 via OPHTHALMIC

## 2012-08-18 MED ORDER — ZOLPIDEM TARTRATE 5 MG PO TABS: 5.0000 mg | ORAL_TABLET | Freq: Every day | ORAL | Status: DC

## 2012-08-18 MED ORDER — HYDROCODONE-ACETAMINOPHEN 5-325 MG PO TABS
ORAL_TABLET | ORAL | Status: AC
  Filled 2012-08-18: qty 2

## 2012-08-18 MED ORDER — CEFAZOLIN SODIUM-DEXTROSE 2-3 GM-% IV SOLR
2.0000 g | Freq: Three times a day (TID) | INTRAVENOUS | Status: DC
  Administered 2012-08-18: 2 g via INTRAVENOUS
  Filled 2012-08-18 (×3): qty 50

## 2012-08-18 MED ORDER — MENTHOL 3 MG MT LOZG: 1.0000 | LOZENGE | OROMUCOSAL | Status: DC | PRN

## 2012-08-18 MED ORDER — LIDOCAINE HCL 4 % MT SOLN
OROMUCOSAL | Status: DC | PRN
  Administered 2012-08-18: 4 mL via TOPICAL

## 2012-08-18 MED ORDER — EPHEDRINE SULFATE 50 MG/ML IJ SOLN
INTRAMUSCULAR | Status: DC | PRN
  Administered 2012-08-18 (×2): 10 mg via INTRAVENOUS

## 2012-08-18 MED ORDER — ACETAMINOPHEN 10 MG/ML IV SOLN: 1000.0000 mg | Freq: Once | INTRAVENOUS | Status: DC | PRN

## 2012-08-18 MED ORDER — DEXAMETHASONE SODIUM PHOSPHATE 4 MG/ML IJ SOLN: 4.0000 mg | Freq: Four times a day (QID) | INTRAMUSCULAR | Status: AC

## 2012-08-18 MED ORDER — 0.9 % SODIUM CHLORIDE (POUR BTL) OPTIME
TOPICAL | Status: DC | PRN
  Administered 2012-08-18: 1000 mL

## 2012-08-18 MED ORDER — THROMBIN 5000 UNITS EX SOLR
CUTANEOUS | Status: DC | PRN
  Administered 2012-08-18 (×4): 5000 [IU] via TOPICAL

## 2012-08-18 MED ORDER — PANTOPRAZOLE SODIUM 40 MG PO TBEC: 40.0000 mg | DELAYED_RELEASE_TABLET | Freq: Every day | ORAL | Status: DC

## 2012-08-18 MED ORDER — FLUOXETINE HCL 20 MG PO CAPS: 20.0000 mg | ORAL_CAPSULE | Freq: Every day | ORAL | Status: DC

## 2012-08-18 MED ORDER — SODIUM CHLORIDE 0.9 % IV SOLN
INTRAVENOUS | Status: AC
  Filled 2012-08-18: qty 500

## 2012-08-18 MED ORDER — LACTATED RINGERS IV SOLN
INTRAVENOUS | Status: DC | PRN
  Administered 2012-08-18 (×2): via INTRAVENOUS

## 2012-08-18 MED ORDER — HYDROMORPHONE HCL PF 1 MG/ML IJ SOLN
1.0000 mg | INTRAMUSCULAR | Status: DC | PRN
  Administered 2012-08-18: 1 mg via INTRAMUSCULAR
  Filled 2012-08-18: qty 1

## 2012-08-18 MED ORDER — DEXAMETHASONE SODIUM PHOSPHATE 10 MG/ML IJ SOLN: 10.0000 mg | INTRAMUSCULAR | Status: DC

## 2012-08-18 MED ORDER — DEXAMETHASONE SODIUM PHOSPHATE 10 MG/ML IJ SOLN
INTRAMUSCULAR | Status: AC
  Administered 2012-08-18: 10 mg via INTRAVENOUS
  Filled 2012-08-18: qty 1

## 2012-08-18 MED ORDER — HYDROMORPHONE HCL PF 1 MG/ML IJ SOLN
INTRAMUSCULAR | Status: AC
  Filled 2012-08-18: qty 2

## 2012-08-18 MED ORDER — ACETAMINOPHEN 650 MG RE SUPP: 650.0000 mg | RECTAL | Status: DC | PRN

## 2012-08-18 MED ORDER — ONDANSETRON HCL 4 MG/2ML IJ SOLN: 4.0000 mg | INTRAMUSCULAR | Status: DC | PRN

## 2012-08-18 SURGICAL SUPPLY — 55 items
APL SKNCLS STERI-STRIP NONHPOA (GAUZE/BANDAGES/DRESSINGS) ×1
BAG DECANTER FOR FLEXI CONT (MISCELLANEOUS) ×2 IMPLANT
BENZOIN TINCTURE PRP APPL 2/3 (GAUZE/BANDAGES/DRESSINGS) ×4 IMPLANT
BIT DRILL TRINICA 2.3MM (BIT) IMPLANT
BRUSH SCRUB EZ PLAIN DRY (MISCELLANEOUS) ×2 IMPLANT
BUR MATCHSTICK NEURO 3.0 LAGG (BURR) ×2 IMPLANT
CANISTER SUCTION 2500CC (MISCELLANEOUS) ×2 IMPLANT
CLOTH BEACON ORANGE TIMEOUT ST (SAFETY) ×2 IMPLANT
CONT SPEC 4OZ CLIKSEAL STRL BL (MISCELLANEOUS) ×2 IMPLANT
DRAPE C-ARM 42X72 X-RAY (DRAPES) ×4 IMPLANT
DRAPE LAPAROTOMY 100X72 PEDS (DRAPES) ×2 IMPLANT
DRAPE MICROSCOPE ZEISS OPMI (DRAPES) ×2 IMPLANT
DRAPE SURG 17X23 STRL (DRAPES) ×4 IMPLANT
DRESSING TELFA 8X3 (GAUZE/BANDAGES/DRESSINGS) ×2 IMPLANT
DRILL BIT TRINICA 2.3MM (BIT) ×2
ELECT COATED BLADE 2.86 ST (ELECTRODE) ×2 IMPLANT
ELECT REM PT RETURN 9FT ADLT (ELECTROSURGICAL) ×2
ELECTRODE REM PT RTRN 9FT ADLT (ELECTROSURGICAL) ×1 IMPLANT
GAUZE SPONGE 4X4 16PLY XRAY LF (GAUZE/BANDAGES/DRESSINGS) IMPLANT
GLOVE BIOGEL M 8.0 STRL (GLOVE) ×1 IMPLANT
GLOVE ECLIPSE 7.5 STRL STRAW (GLOVE) ×5 IMPLANT
GLOVE EXAM NITRILE LRG STRL (GLOVE) IMPLANT
GLOVE EXAM NITRILE MD LF STRL (GLOVE) ×2 IMPLANT
GLOVE EXAM NITRILE XL STR (GLOVE) IMPLANT
GLOVE EXAM NITRILE XS STR PU (GLOVE) IMPLANT
GLOVE INDICATOR 8.0 STRL GRN (GLOVE) ×1 IMPLANT
GOWN BRE IMP SLV AUR LG STRL (GOWN DISPOSABLE) IMPLANT
GOWN BRE IMP SLV AUR XL STRL (GOWN DISPOSABLE) IMPLANT
GOWN STRL REIN 2XL LVL4 (GOWN DISPOSABLE) ×2 IMPLANT
HEAD HALTER (SOFTGOODS) ×2 IMPLANT
INTERBODY TM 11X14X7-7DEG ANG (Metal Cage) ×1 IMPLANT
KIT BASIN OR (CUSTOM PROCEDURE TRAY) ×2 IMPLANT
KIT ROOM TURNOVER OR (KITS) ×2 IMPLANT
NDL SPNL 20GX3.5 QUINCKE YW (NEEDLE) ×1 IMPLANT
NEEDLE SPNL 20GX3.5 QUINCKE YW (NEEDLE) ×2 IMPLANT
NS IRRIG 1000ML POUR BTL (IV SOLUTION) ×2 IMPLANT
PACK LAMINECTOMY NEURO (CUSTOM PROCEDURE TRAY) ×2 IMPLANT
PAD ARMBOARD 7.5X6 YLW CONV (MISCELLANEOUS) ×2 IMPLANT
PATTIES SURGICAL .75X.75 (GAUZE/BANDAGES/DRESSINGS) ×2 IMPLANT
PLATE 40MM (Plate) ×1 IMPLANT
PUTTY BONE GRAFT KIT 2.5ML (Bone Implant) ×1 IMPLANT
RUBBERBAND STERILE (MISCELLANEOUS) ×4 IMPLANT
SCREW SD FIXED 12MM (Screw) ×6 IMPLANT
SPACER TMS 11X14X6MM (Spacer) ×1 IMPLANT
SPONGE GAUZE 4X4 12PLY (GAUZE/BANDAGES/DRESSINGS) ×2 IMPLANT
SPONGE INTESTINAL PEANUT (DISPOSABLE) ×2 IMPLANT
SPONGE SURGIFOAM ABS GEL SZ50 (HEMOSTASIS) ×2 IMPLANT
STRIP CLOSURE SKIN 1/2X4 (GAUZE/BANDAGES/DRESSINGS) ×2 IMPLANT
SUT PDS AB 5-0 P3 18 (SUTURE) ×2 IMPLANT
SUT VIC AB 3-0 CP2 18 (SUTURE) ×2 IMPLANT
SYR 20ML ECCENTRIC (SYRINGE) IMPLANT
TOWEL OR 17X24 6PK STRL BLUE (TOWEL DISPOSABLE) ×2 IMPLANT
TOWEL OR 17X26 10 PK STRL BLUE (TOWEL DISPOSABLE) ×2 IMPLANT
TRAP SPECIMEN MUCOUS 40CC (MISCELLANEOUS) IMPLANT
WATER STERILE IRR 1000ML POUR (IV SOLUTION) ×2 IMPLANT

## 2012-08-18 NOTE — H&P (Signed)
[name] is an 47 y.o. female.   Chief Complaint: Neck and left arm pain HPI: The patient is a 47 year old female who presented with low back radicular pain and neck and left arm pain. She underwent a lumbar fusion and did well and now that she has recovered from that she is still left with the neck and left greater than right upper extremity pain. She's had imaging studies which shows abnormalities at C5-6 and C6-7 and after discussing the options and failing aggressive conservative therapy the patient has requested surgery and now comes for a two-level anterior cervical discectomy with fusion and plating. I have had a long discussion with her regarding the risks and benefits of surgical intervention. The risks discussed include but are not limited to bleeding infection weakness numbness paralysis coma quadriplegia hoarseness and death. We have discussed alternative methods of therapy offered risks and benefits of nonintervention. She has had the opportunity to ask numerous questions and appears to understand. With this information in hand she has requested that we proceed with surgery.  Past Medical History  Diagnosis Date  . IBS (irritable bowel syndrome)   . GERD (gastroesophageal reflux disease)     IBS  . Arthritis     disk disease  . Cancer   . Vocal cord cancer     Past Surgical History  Procedure Date  . Carpal tunnel release   . Nose surgery   . Tonsillectomy   . Cleft palate repair   . Laparoscopic tubal ligation     [facility]  . Throat surgery 2000    for throat cancer/no problems [place]  . Endometrial ablation   . Anterior lat lumbar fusion 03/19/2012    Procedure: ANTERIOR LATERAL LUMBAR FUSION 1 LEVEL;  Surgeon: Reinaldo Meeker, MD;  Location: [facility];  Service: Neurosurgery;  Laterality: Right;  Anterolateral Lumbar Interbody Fusion, Lumbar Three-Four (Hand broke through glove and nat in the room)    History reviewed. No pertinent  family history. Social History:  reports that she has been smoking Cigarettes.  She has a 26 pack-year smoking history. She has never used smokeless tobacco. She reports that she drinks about one ounce of alcohol per week. She reports that she does not use illicit drugs.  Allergies:  Allergies  Allergen Reactions  . Bee Venom Anaphylaxis    Medications Prior to Admission  Medication Sig Dispense Refill  . FLUoxetine (PROZAC) 20 MG capsule Take 20 mg by mouth daily.      Marland Kitchen HYDROcodone-acetaminophen (NORCO/VICODIN) 5-325 MG per tablet Take 1-2 tablets by mouth every 8 (eight) hours as needed. For pain      . meloxicam (MOBIC) 15 MG tablet Take 15 mg by mouth daily.      . pantoprazole (PROTONIX) 40 MG tablet Take 40 mg by mouth daily.      Marland Kitchen zolpidem (AMBIEN) 10 MG tablet Take 10 mg by mouth at bedtime.      . Aspirin-Caffeine (BC FAST PAIN RELIEF ARTHRITIS PO) Take 1 tablet by mouth 2 (two) times daily as needed. For arthritis pain      . EPINEPHrine (EPI-PEN) 0.3 mg/0.3 mL DEVI Inject 0.3 mg into the muscle once as needed. For bee venom      . Omega-3 Fatty Acids (FISH OIL) 1000 MG CAPS Take 1 capsule by mouth daily.        No results found for this or any previous visit (from the past 48 hour(s)). No results found.  Review  of systems not obtained due to patient factors.  Blood pressure 100/69, pulse 60, temperature 97.9 F (36.6 C), temperature source Oral, resp. rate 18, SpO2 96.00%.  The patient is awake or and oriented. She is no facial asymmetry. Her gait is nonantalgic. Reflexes are decreased but equal. She is normal strength and sensation. 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 The plan is for a two-level anterior cervical discectomy with fusion and plating.  Reinaldo Meeker, MD 08/18/2012, 7:26 AM

## 2012-08-18 NOTE — Anesthesia Postprocedure Evaluation (Signed)
  Anesthesia Post-op Note  Patient: [name]  Procedure(s) Performed: Procedure(s) (LRB) with comments: ANTERIOR CERVICAL DECOMPRESSION/DISCECTOMY FUSION 2 LEVELS (N/A) - anterior cervical five-six,six-seven decompression fusion plating  Patient Location: PACU  Anesthesia Type:General  Level of Consciousness: awake, alert  and oriented  Airway and Oxygen Therapy: Patient Spontanous Breathing  Post-op Pain: mild  Post-op Assessment: Post-op Vital signs reviewed, Patient's Cardiovascular Status Stable, Respiratory Function Stable, Patent Airway and Pain level controlled  Post-op Vital Signs: stable  Complications: No apparent anesthesia complications

## 2012-08-18 NOTE — Anesthesia Preprocedure Evaluation (Addendum)
Anesthesia Evaluation  Patient identified by MRN, date of birth, ID band Patient awake    Reviewed: Allergy & Precautions, H&P , NPO status , Patient's Chart, lab work & pertinent test results  Airway Mallampati: I TM Distance: >3 FB Neck ROM: Full    Dental  (+) Teeth Intact and Dental Advisory Given   Pulmonary Current Smoker,  breath sounds clear to auscultation        Cardiovascular Rhythm:Regular Rate:Normal     Neuro/Psych PSYCHIATRIC DISORDERS    GI/Hepatic PUD, GERD-  Medicated and Controlled,  Endo/Other    Renal/GU      Musculoskeletal   Abdominal (+) + obese,  Abdomen: soft.    Peds  Hematology   Anesthesia Other Findings   Reproductive/Obstetrics                          Anesthesia Physical Anesthesia Plan  ASA: III  Anesthesia Plan: General   Post-op Pain Management:    Induction: Intravenous  Airway Management Planned: Oral ETT  Additional Equipment:   Intra-op Plan:   Post-operative Plan: Extubation in OR  Informed Consent: I have reviewed the patients History and Physical, chart, labs and discussed the procedure including the risks, benefits and alternatives for the proposed anesthesia with the patient or authorized representative who has indicated his/her understanding and acceptance.   Dental advisory given and Dental Advisory Given  Plan Discussed with: CRNA, Surgeon and Anesthesiologist  Anesthesia Plan Comments: (HNP C5-6 C6-7 Smoker Obesity Fibromyalgia GERD H/O Vocal cord tumor >10 years ago  Plan GA with oral ETT  Kipp Brood, MD)       Anesthesia Quick Evaluation

## 2012-08-18 NOTE — Op Note (Signed)
Preop diagnosis: Spondylosis C5-6 C6-7 with C6 and C7 nerve root compression Postop diagnosis: Same Procedure: C5-6 C6-7 decompressive anterior cervical discectomy with trabecular metal interbody fusion followed by Trinica anterior cervical plating Surgeon: Gerlene Fee Assistant: Botero  After and placed the supine position the patient's neck was prepped and draped in usual sterile fashion. Localizing fluoroscopy was used prior to incision to identify the appropriate level. Transverse incision was made in the right anterior neck started the midline and headed towards the medial aspect of the sternocleidomastoid muscle. The platysma muscle was then incised transversely and the natural fascial plane between the strap muscles medially and the sternocleidomastoid laterally was identified and followed down to the anterior aspect the cervical spine. Longus coli muscles were identified split in the midline and stripped away bilaterally with unipolar coagulation and Kitner dissection. Self-retaining tract was placed for exposure and x-ray showed approach the appropriate levels. Using a 15 blade the annulus the disc at C5-6 and C6-7 was incised. Using pituitary rongeurs and curettes approximately 90% of disc material was removed at both levels. High-speed drill was used to widen the interspace and bony shavings were saved for use later in the case. At this time the microscope was draped brought in the field and used for the remainder of the case. Starting C6-7 the rate remainder of the disc material down the posterior longitudinal ligament was removed. Ligament was then incised transversely and the cut edges removed a Kerrison punch. Thorough decompression was carried out on the spinal dura into the foramen bilaterally until the C7 nerve roots well visualized well decompressed. Attention was then turned to C5-C6 were similar decompression was carried out. Great care was taken to decompress the spinal dura and proximal  foramen bilaterally until the C6 nerve roots well visualized well decompressed bilaterally. At this time inspection was carried out in all directions for any evidence of residual compression and none could be identified. Irrigation was carried out and any bleeding control proper coagulation Gelfoam. Measurements were taken and one 6 mm one 7 mm lordotic graft was chosen. The inner part of the graft was filled with a mixture of autologous bone morselized allograft and a 6 mm graft was impacted C5-6 and a 7 mm graft impacted C6-7. Fossae showed to be in excellent position. An appropriate length Trinica anterior cervical plate was then chosen. Under fluoroscopic guidance drill holes were placed followed by placing of 12 mm screws x6. Locking mechanism was rotated locked position final fluoroscopy showed good position of the plates screws and plugs. Irrigation was carried out and any bleeding control proper coagulation. The was then closed with inverted Vicryl on the platysma muscle and subcuticular and Steri-Strips were placed on the skin. Shortness and soft collar applied the patient was extubated and taken to cut them in stable condition.

## 2012-08-18 NOTE — Discharge Summary (Signed)
Physician Discharge Summary  Patient ID: [name] MRN: [id number] DOB/AGE: August 08, 1965 47 y.o.  Admit date: 08/18/2012 Discharge date: 08/18/2012  Admission Diagnoses:cervical stenosis  Discharge Diagnoses: same   Discharged Condition.  No pain swallows well  Hospital Course: surgery  Consults: none  Significant Diagnostic Studies: mri  Treatments:anterior cervical fusion  Discharge Exam: Blood pressure 104/57, pulse 85, temperature 98.4 F (36.9 C), temperature source Oral, resp. rate 16, SpO2 93.00%. No weakness.  Disposition: 06-Home-Health Care Svc     Medication List     As of 08/18/2012  7:53 PM    ASK your doctor about these medications         AMBIEN 10 MG tablet   Generic drug: zolpidem   Take 10 mg by mouth at bedtime.      BC FAST PAIN RELIEF ARTHRITIS PO   Take 1 tablet by mouth 2 (two) times daily as needed. For arthritis pain      EPINEPHrine 0.3 mg/0.3 mL Devi   Commonly known as: EPI-PEN   Inject 0.3 mg into the muscle once as needed. For bee venom      Fish Oil 1000 MG Caps   Take 1 capsule by mouth daily.      FLUoxetine 20 MG capsule   Commonly known as: PROZAC   Take 20 mg by mouth daily.      HYDROcodone-acetaminophen 5-325 MG per tablet   Commonly known as: NORCO/VICODIN   Take 1-2 tablets by mouth every 8 (eight) hours as needed. For pain      meloxicam 15 MG tablet   Commonly known as: MOBIC   Take 15 mg by mouth daily.      pantoprazole 40 MG tablet   Commonly known as: PROTONIX   Take 40 mg by mouth daily.         Signed: Karn Cassis 08/18/2012, 7:53 PM

## 2012-08-18 NOTE — Anesthesia Procedure Notes (Signed)
Procedure Name: Intubation Date/Time: 08/18/2012 7:42 AM Performed by: Carmela Rima Pre-anesthesia Checklist: Patient identified, Timeout performed, Emergency Drugs available, Suction available and Patient being monitored Patient Re-evaluated:Patient Re-evaluated prior to inductionOxygen Delivery Method: Circle system utilized Preoxygenation: Pre-oxygenation with 100% oxygen Intubation Type: IV induction Ventilation: Mask ventilation without difficulty Laryngoscope Size: Mac and 3 Grade View: Grade I Tube type: Oral Tube size: 7.5 mm Number of attempts: 1 Airway Equipment and Method: LTA kit utilized Placement Confirmation: ETT inserted through vocal cords under direct vision,  positive ETCO2 and breath sounds checked- equal and bilateral

## 2012-08-18 NOTE — Progress Notes (Signed)
Patient ID: [name], female   DOB: [birth year], 47 y.o.   MRN: [id number] Ate,ambulated. RN called me because she wants to go home because of weather. No weakness

## 2012-08-18 NOTE — Progress Notes (Signed)
PT. UP AD LIB IN HALL WITH STEADY GAIT, VOIDING WITHOUT DIFFICULTIES, AND TOLERATING DIET. DR. Jeral Fruit IN TO SEE PT. AND SHE HAD BEEN DISCHARGED HOME. V/S AT 1946 B/P=104/57, P=85, R=16, TEMP98.4, SAT 93% ON ROOM AIR. PT. VERBALIZED UNDERSTANDING OF D/C ORDERS. SPOUSE HERE AND ASSISTING PT. HOME. PT. D/C VIA W/C AND STAFF ASSISTING PT. TO CAR.  CHRIS Kaiyla Stahly RN

## 2012-08-18 NOTE — Preoperative (Signed)
Beta Blockers   Reason not to administer Beta Blockers:Not Applicable 

## 2012-08-18 NOTE — Transfer of Care (Signed)
Immediate Anesthesia Transfer of Care Note  Patient: [name]  Procedure(s) Performed: Procedure(s) (LRB) with comments: ANTERIOR CERVICAL DECOMPRESSION/DISCECTOMY FUSION 2 LEVELS (N/A) - anterior cervical five-six,six-seven decompression fusion plating  Patient Location: PACU  Anesthesia Type:General  Level of Consciousness: awake, alert  and oriented  Airway & Oxygen Therapy: Patient Spontanous Breathing and Patient connected to nasal cannula oxygen  Post-op Assessment: Report given to PACU RN, Post -op Vital signs reviewed and stable and Patient moving all extremities X 4  Post vital signs: Reviewed and stable  Complications: No apparent anesthesia complications

## 2012-08-18 NOTE — Plan of Care (Signed)
Problem: Consults Goal: Diagnosis - Spinal Surgery Outcome: Completed/Met Date Met:  08/18/12 Cervical Spine Fusion     

## 2012-08-21 ENCOUNTER — Encounter (HOSPITAL_COMMUNITY): Payer: Self-pay | Admitting: Neurosurgery

## 2012-10-04 ENCOUNTER — Emergency Department (HOSPITAL_BASED_OUTPATIENT_CLINIC_OR_DEPARTMENT_OTHER)
Admission: EM | Admit: 2012-10-04 | Discharge: 2012-10-05 | Disposition: A | Discharge status: Home / Self Care | Payer: Medicaid Other | Attending: Emergency Medicine | Admitting: Emergency Medicine

## 2012-10-04 ENCOUNTER — Emergency Department (HOSPITAL_BASED_OUTPATIENT_CLINIC_OR_DEPARTMENT_OTHER): Payer: Medicaid Other

## 2012-10-04 ENCOUNTER — Encounter (HOSPITAL_BASED_OUTPATIENT_CLINIC_OR_DEPARTMENT_OTHER): Payer: Self-pay | Admitting: *Deleted

## 2012-10-04 DIAGNOSIS — M25511 Pain in right shoulder: Secondary | ICD-10-CM

## 2012-10-04 DIAGNOSIS — Z79899 Other long term (current) drug therapy: Secondary | ICD-10-CM | POA: Insufficient documentation

## 2012-10-04 DIAGNOSIS — IMO0001 Reserved for inherently not codable concepts without codable children: Secondary | ICD-10-CM | POA: Insufficient documentation

## 2012-10-04 DIAGNOSIS — K219 Gastro-esophageal reflux disease without esophagitis: Secondary | ICD-10-CM | POA: Insufficient documentation

## 2012-10-04 DIAGNOSIS — Z8719 Personal history of other diseases of the digestive system: Secondary | ICD-10-CM | POA: Insufficient documentation

## 2012-10-04 DIAGNOSIS — Z8739 Personal history of other diseases of the musculoskeletal system and connective tissue: Secondary | ICD-10-CM | POA: Insufficient documentation

## 2012-10-04 DIAGNOSIS — Z8521 Personal history of malignant neoplasm of larynx: Secondary | ICD-10-CM | POA: Insufficient documentation

## 2012-10-04 DIAGNOSIS — F17200 Nicotine dependence, unspecified, uncomplicated: Secondary | ICD-10-CM | POA: Insufficient documentation

## 2012-10-04 DIAGNOSIS — M25519 Pain in unspecified shoulder: Secondary | ICD-10-CM | POA: Insufficient documentation

## 2012-10-04 HISTORY — DX: Fibromyalgia: M79.7

## 2012-10-04 LAB — CBC WITH DIFFERENTIAL/PLATELET
Eosinophils Relative: 1 % (ref 0–5)
HCT: 37.2 % (ref 36.0–46.0)
Hemoglobin: 12.1 g/dL (ref 12.0–15.0)
Lymphocytes Relative: 37 % (ref 12–46)
Lymphs Abs: 3.3 10*3/uL (ref 0.7–4.0)
MCV: 95.1 fL (ref 78.0–100.0)
Monocytes Absolute: 0.8 10*3/uL (ref 0.1–1.0)
Monocytes Relative: 9 % (ref 3–12)
Neutro Abs: 4.7 10*3/uL (ref 1.7–7.7)
RBC: 3.91 MIL/uL (ref 3.87–5.11)
RDW: 13 % (ref 11.5–15.5)
WBC: 8.9 10*3/uL (ref 4.0–10.5)

## 2012-10-04 MED ORDER — KETOROLAC TROMETHAMINE 60 MG/2ML IM SOLN
60.0000 mg | Freq: Once | INTRAMUSCULAR | Status: AC
  Administered 2012-10-04: 60 mg via INTRAMUSCULAR
  Filled 2012-10-04: qty 2

## 2012-10-04 NOTE — ED Notes (Signed)
Pt states she is seeing a rheumatologist for about 4 months. Has been dx'd with ? Torn rotator cuff. Has had steroid shots x 4. LD 2 weeks ago. Now having increased pain.

## 2012-10-04 NOTE — ED Provider Notes (Signed)
History  This chart was scribed for Hilario Quarry, MD by Shari Heritage, ED Scribe. The patient was seen in room MH07/MH07. Patient's care was started at 1904.   CSN: [id number]  Arrival date & time 10/04/12  1753   First MD Initiated Contact with Patient 10/04/12 1904      Chief Complaint  Patient presents with  . Arm Pain     Patient is a 47 y.o. female presenting with shoulder pain. The history is provided by the patient. No language interpreter was used.  Shoulder Pain This is a recurrent problem. The current episode started more than 1 week ago. The problem occurs constantly. The problem has not changed since onset.Exacerbated by: movement, palpation. Nothing relieves the symptoms. Treatments tried: NSAIDs.      HPI Comments: [name] is a 47 y.o. female who presents to the Emergency Department complaining of constant, shooting, anterior right shoulder pain onset 2 weeks ago. Patient has been seeing Dr. Kellie Simmering, a rheumatologist for the past 4 months. She states she has had recurring pain in her left shoulder and has been getting treated with a series of steroid shots. She had her 4th steroid shot about 2 weeks ago and states she has had increased pain since then. Patient has taken Advil today for pain without relief. She hasn't tried applying cold compresses or immobilizing the arm. There is no numbness, weakness, vomiting or fever. She states that she hasn't seen an orthopedist about this problem due to complications with her insurance. She denies history of PE or DVT.   Past Medical History  Diagnosis Date  . IBS (irritable bowel syndrome)   . GERD (gastroesophageal reflux disease)     IBS  . Arthritis     disk disease  . Cancer   . Vocal cord cancer   . Fibromyalgia     Past Surgical History  Procedure Laterality Date  . Carpal tunnel release    . Nose surgery    . Tonsillectomy    . Cleft palate repair    . Laparoscopic tubal ligation      [facility]   . Throat surgery  2000    for throat cancer/no problems intubation...[facility]  . Endometrial ablation    . Anterior lat lumbar fusion  03/19/2012    Procedure: ANTERIOR LATERAL LUMBAR FUSION 1 LEVEL;  Surgeon: Reinaldo Meeker, MD;  Location: [facility];  Service: Neurosurgery;  Laterality: Right;  Anterolateral Lumbar Interbody Fusion, Lumbar Three-Four (Hand broke through glove and nat in the room)  . Anterior cervical decomp/discectomy fusion  08/18/2012    Procedure: ANTERIOR CERVICAL DECOMPRESSION/DISCECTOMY FUSION 2 LEVELS;  Surgeon: Reinaldo Meeker, MD;  Location: [facility];  Service: Neurosurgery;  Laterality: N/A;  anterior cervical five-six,six-seven decompression fusion plating    History reviewed. No pertinent family history.  History  Substance Use Topics  . Smoking status: Current Every Day Smoker -- 1.00 packs/day for 26 years    Types: Cigarettes  . Smokeless tobacco: Never Used  . Alcohol Use: 1.0 oz/week    2 drink(s) per week     Comment: occasional    OB History   Grav Para Term Preterm Abortions TAB SAB Ect Mult Living   1 1        1       Review of Systems  Constitutional: Negative for fever.  Gastrointestinal: Negative for vomiting.  Musculoskeletal: Positive for myalgias.  Neurological: Negative for weakness and numbness.  All other systems  reviewed and are negative.    Allergies  Bee venom  Home Medications   Current Outpatient Rx  Name  Route  Sig  Dispense  Refill  . Aspirin-Caffeine (BC FAST PAIN RELIEF ARTHRITIS PO)   Oral   Take 1 tablet by mouth 2 (two) times daily as needed. For arthritis pain         . EPINEPHrine (EPI-PEN) 0.3 mg/0.3 mL DEVI   Intramuscular   Inject 0.3 mg into the muscle once as needed. For bee venom         . FLUoxetine (PROZAC) 20 MG capsule   Oral   Take 20 mg by mouth daily.         Marland Kitchen HYDROcodone-acetaminophen (NORCO/VICODIN) 5-325 MG per tablet   Oral   Take 1-2 tablets by mouth every 8  (eight) hours as needed. For pain         . Omega-3 Fatty Acids (FISH OIL) 1000 MG CAPS   Oral   Take 1 capsule by mouth daily.         . pantoprazole (PROTONIX) 40 MG tablet   Oral   Take 40 mg by mouth daily.         Marland Kitchen zolpidem (AMBIEN) 10 MG tablet   Oral   Take 10 mg by mouth at bedtime.           Triage Vitals: BP 147/77  Pulse 81  Temp(Src) 98 F (36.7 C) (Oral)  Resp 18  Ht 5' 5.5" (1.664 m)  Wt 252 lb (114.306 kg)  BMI 41.28 kg/m2  SpO2 100%  Physical Exam  Constitutional: She is oriented to person, place, and time. She appears well-developed and well-nourished.  HENT:  Head: Normocephalic and atraumatic.  Eyes: Conjunctivae and EOM are normal. Pupils are equal, round, and reactive to light.  Neck: Normal range of motion. Neck supple.  Cardiovascular: Normal rate.   Pulmonary/Chest: Effort normal.  Musculoskeletal: Normal range of motion.       Right shoulder: She exhibits tenderness.       Right upper arm: She exhibits tenderness.  Diffuse tenderness in shoulder and arm. No crepitance. No redness. No warmth. Full active ROM but painful throughout.   Neurological: She is alert and oriented to person, place, and time.  Skin: Skin is warm and dry. No rash noted. No erythema.  Psychiatric: She has a normal mood and affect. Her behavior is normal.    ED Course  Procedures (including critical care time) DIAGNOSTIC STUDIES: Oxygen Saturation is 100% on room air, normal by my interpretation.    COORDINATION OF CARE: 7:16 PM- Patient informed of current plan for treatment and evaluation and agrees with plan at this time.    Labs Reviewed - No data to display  Dg Shoulder Right  10/04/2012  *RADIOLOGY REPORT*  Clinical Data: Right shoulder pain  RIGHT SHOULDER - 2+ VIEW  Comparison: None.  Findings: No fracture or dislocation is seen.  The joint spaces are preserved.  The visualized soft tissues are unremarkable.  Visualized right lung is clear.   IMPRESSION: No fracture or dislocation is seen.   Original Report Authenticated By: Charline Bills, M.D.      No diagnosis found.    MDM  Chronic right shoulder pain.  No evidence of acute abnormalities.    I personally performed the services described in this documentation, which was scribed in my presence. The recorded information has been reviewed and considered.    Hilario Quarry, MD 10/04/12  2010 

## 2012-10-27 ENCOUNTER — Encounter: Payer: Self-pay | Admitting: Orthopedic Surgery

## 2012-10-27 ENCOUNTER — Ambulatory Visit (INDEPENDENT_AMBULATORY_CARE_PROVIDER_SITE_OTHER): Payer: Medicaid Other | Admitting: Orthopedic Surgery

## 2012-10-27 VITALS — BP 122/82 | Ht 65.5 in | Wt 245.0 lb

## 2012-10-27 DIAGNOSIS — M25519 Pain in unspecified shoulder: Secondary | ICD-10-CM

## 2012-10-27 DIAGNOSIS — M25511 Pain in right shoulder: Secondary | ICD-10-CM

## 2012-10-27 DIAGNOSIS — M75101 Unspecified rotator cuff tear or rupture of right shoulder, not specified as traumatic: Secondary | ICD-10-CM | POA: Insufficient documentation

## 2012-10-27 DIAGNOSIS — M67919 Unspecified disorder of synovium and tendon, unspecified shoulder: Secondary | ICD-10-CM

## 2012-10-27 NOTE — Progress Notes (Signed)
Patient ID: [name], female   DOB: [birth year], 47 y.o.   MRN: [id number] Chief Complaint  Patient presents with  . Shoulder Pain    Right shoulder pain, no injury    History this is a 47 year old female status post cervical fusion in January of this year lumbar fusion carpal tunnels released bilaterally surgery for deviated septum sinus surgery tonsillectomy, uterine surgery.  [facility]. Family history heart disease arthritis lung disease cancer asthma and diabetes. Social history of half pack per day smoking 3 cups or glasses of caffeine 12th grade education single not employed no allergies history of irritable bowel syndrome fibromyalgia acid reflux lumbar spondylosis who presents with right shoulder pain.  The pain started in September or October of 2013 with sudden onset. She has been treated by Dr. Kellie Simmering and also has seen a pain management specialist. She's had 4 steroid injections, tramadol 50 mg of hydrocodone 5 mg without relief  She complains of sharp dull throbbing stabbing 10 out of 10 constant pain with loss of motion in the right shoulder no trauma  She also tried Lyrica and developed visual problems as a complication  Her review of systems is positive for fatigue stiffness depression and seasonal allergies all other systems were reviewed and were normal   Vital signs are stable as recorded  General appearance is normal  The patient is alert and oriented x3  The patient's mood and affect are normal  Gait assessment: No gait disturbances detected  The cardiovascular exam reveals normal pulses and temperature without edema or  swelling.  The lymphatic system is negative for palpable lymph nodes in the cervical spine  The sensory exam is normal.  There are no pathologic reflexes.  Balance is normal.   Exam of the left shoulder reveals normal range of motion no tenderness of stability tests were normal strength was intact skin  was normal  Right shoulder no tenderness painful range of motion throughout the entire arc of motion, no instability. Drop test negative. Skin intact. No swelling. Positive impingement sign.  X-ray was done and home health was negative. I personally reviewed including a report  Right shoulder pain - Plan: Ambulatory referral to Physical Therapy  Rotator cuff syndrome of right shoulder  At this point she's not had physical therapy she's had an adequate course of injection and pain medication. I am reluctant to give her any other narcotic other than hydrocodone 5 mg with tramadol. She says neither one works. She has not had physical therapy we will try physical therapy if she does not improve from that then we will order an MRI of her shoulder

## 2012-11-03 ENCOUNTER — Ambulatory Visit: Payer: Medicaid Other | Attending: Orthopedic Surgery | Admitting: Physical Therapy

## 2012-11-03 DIAGNOSIS — R5381 Other malaise: Secondary | ICD-10-CM | POA: Insufficient documentation

## 2012-11-03 DIAGNOSIS — IMO0001 Reserved for inherently not codable concepts without codable children: Secondary | ICD-10-CM | POA: Insufficient documentation

## 2012-11-03 DIAGNOSIS — M25519 Pain in unspecified shoulder: Secondary | ICD-10-CM | POA: Insufficient documentation

## 2012-11-06 ENCOUNTER — Ambulatory Visit: Payer: Medicaid Other | Admitting: Physical Therapy

## 2012-11-11 ENCOUNTER — Ambulatory Visit: Payer: Medicaid Other | Admitting: Physical Therapy

## 2012-11-17 ENCOUNTER — Ambulatory Visit: Payer: Medicaid Other | Admitting: Physical Therapy

## 2012-11-22 ENCOUNTER — Encounter (HOSPITAL_BASED_OUTPATIENT_CLINIC_OR_DEPARTMENT_OTHER): Payer: Self-pay

## 2012-11-22 ENCOUNTER — Emergency Department (HOSPITAL_BASED_OUTPATIENT_CLINIC_OR_DEPARTMENT_OTHER)
Admission: EM | Admit: 2012-11-22 | Discharge: 2012-11-22 | Disposition: A | Discharge status: Home / Self Care | Payer: Medicaid Other | Attending: Emergency Medicine | Admitting: Emergency Medicine

## 2012-11-22 DIAGNOSIS — F17200 Nicotine dependence, unspecified, uncomplicated: Secondary | ICD-10-CM | POA: Insufficient documentation

## 2012-11-22 DIAGNOSIS — Z85819 Personal history of malignant neoplasm of unspecified site of lip, oral cavity, and pharynx: Secondary | ICD-10-CM | POA: Insufficient documentation

## 2012-11-22 DIAGNOSIS — M25519 Pain in unspecified shoulder: Secondary | ICD-10-CM | POA: Insufficient documentation

## 2012-11-22 DIAGNOSIS — Z9889 Other specified postprocedural states: Secondary | ICD-10-CM | POA: Insufficient documentation

## 2012-11-22 DIAGNOSIS — Z8719 Personal history of other diseases of the digestive system: Secondary | ICD-10-CM | POA: Insufficient documentation

## 2012-11-22 DIAGNOSIS — Z8739 Personal history of other diseases of the musculoskeletal system and connective tissue: Secondary | ICD-10-CM | POA: Insufficient documentation

## 2012-11-22 DIAGNOSIS — M479 Spondylosis, unspecified: Secondary | ICD-10-CM | POA: Insufficient documentation

## 2012-11-22 DIAGNOSIS — Z79899 Other long term (current) drug therapy: Secondary | ICD-10-CM | POA: Insufficient documentation

## 2012-11-22 DIAGNOSIS — G8929 Other chronic pain: Secondary | ICD-10-CM | POA: Insufficient documentation

## 2012-11-22 DIAGNOSIS — K219 Gastro-esophageal reflux disease without esophagitis: Secondary | ICD-10-CM | POA: Insufficient documentation

## 2012-11-22 MED ORDER — OXYCODONE-ACETAMINOPHEN 5-325 MG PO TABS: 1.0000 | ORAL_TABLET | Freq: Four times a day (QID) | ORAL | Status: DC | PRN

## 2012-11-22 MED ORDER — KETOROLAC TROMETHAMINE 30 MG/ML IJ SOLN
60.0000 mg | Freq: Once | INTRAMUSCULAR | Status: AC
  Administered 2012-11-22: 60 mg via INTRAMUSCULAR
  Filled 2012-11-22: qty 2

## 2012-11-22 MED ORDER — HYDROMORPHONE HCL PF 2 MG/ML IJ SOLN
2.0000 mg | Freq: Once | INTRAMUSCULAR | Status: AC
  Administered 2012-11-22: 2 mg via INTRAMUSCULAR
  Filled 2012-11-22: qty 1

## 2012-11-22 NOTE — ED Notes (Signed)
Pt sts pain in R arm radiating up neck and into head; started 2 mos ago, pain described as stabbing. Pt sts she had neck surgery in January, "Spacers in between vertebrae and a titanium plate with 6 screws".

## 2012-11-22 NOTE — ED Notes (Signed)
Pt states that she has severe R sided neck, arm, and hand pain.  Pt states that she woke up with this pain, unbearable, pt tearful in triage.  Hx of neck surgery.

## 2012-11-22 NOTE — ED Provider Notes (Signed)
History     CSN: [id number]  Arrival date & time 11/22/12  1215   First MD Initiated Contact with Patient 11/22/12 1320      Chief Complaint  Patient presents with  . Neck Pain  . Arm Pain    (Consider location/radiation/quality/duration/timing/severity/associated sxs/prior treatment) Patient is a 47 y.o. female presenting with neck pain and arm pain.  Neck Pain Arm Pain   Pt with chronic R shoulder and neck pain ongoing for several weeks has been to several doctors in the past for same. She is s/p cervical disk fusion in January. She was being seen by Dr. Kellie Simmering for steroid injection and most recently went to Dr. Romeo Apple in [place] who recommended physical therapy which she has been doing for three weeks without improvement. She reports severe aching pain in R shoulder, radiating into her neck and worse with movement. No recent falls or injury.   Past Medical History  Diagnosis Date  . IBS (irritable bowel syndrome)   . GERD (gastroesophageal reflux disease)     IBS  . Arthritis     disk disease  . Cancer   . Vocal cord cancer   . Fibromyalgia     Past Surgical History  Procedure Laterality Date  . Carpal tunnel release    . Nose surgery    . Tonsillectomy    . Cleft palate repair    . Laparoscopic tubal ligation      [facility]  . Throat surgery  2000    for throat cancer/no problems intubation[place]  . Endometrial ablation    . Anterior lat lumbar fusion  03/19/2012    Procedure: ANTERIOR LATERAL LUMBAR FUSION 1 LEVEL;  Surgeon: Reinaldo Meeker, MD;  Location: [facility];  Service: Neurosurgery;  Laterality: Right;  Anterolateral Lumbar Interbody Fusion, Lumbar Three-Four (Hand broke through glove and nat in the room)  . Anterior cervical decomp/discectomy fusion  08/18/2012    Procedure: ANTERIOR CERVICAL DECOMPRESSION/DISCECTOMY FUSION 2 LEVELS;  Surgeon: Reinaldo Meeker, MD;  Location: [facility];  Service: Neurosurgery;  Laterality: N/A;   anterior cervical five-six,six-seven decompression fusion plating    History reviewed. No pertinent family history.  History  Substance Use Topics  . Smoking status: Current Every Day Smoker -- 1.00 packs/day for 26 years    Types: Cigarettes  . Smokeless tobacco: Never Used  . Alcohol Use: 1.0 oz/week    2 drink(s) per week     Comment: occasional    OB History   Grav Para Term Preterm Abortions TAB SAB Ect Mult Living   1 1        1       Review of Systems  HENT: Positive for neck pain.    All other systems reviewed and are negative except as noted in HPI.   Allergies  Bee venom  Home Medications   Current Outpatient Rx  Name  Route  Sig  Dispense  Refill  . Aspirin-Caffeine (BC FAST PAIN RELIEF ARTHRITIS PO)   Oral   Take 1 tablet by mouth 2 (two) times daily as needed. For arthritis pain         . DULoxetine (CYMBALTA) 30 MG capsule   Oral   Take 30 mg by mouth 2 (two) times daily.         . pantoprazole (PROTONIX) 40 MG tablet   Oral   Take 40 mg by mouth daily.         Marland Kitchen zolpidem (AMBIEN) 10 MG  tablet   Oral   Take 10 mg by mouth at bedtime.         Marland Kitchen EPINEPHrine (EPI-PEN) 0.3 mg/0.3 mL DEVI   Intramuscular   Inject 0.3 mg into the muscle once as needed. For bee venom         . FLUoxetine (PROZAC) 20 MG capsule   Oral   Take 20 mg by mouth daily.         Marland Kitchen HYDROcodone-acetaminophen (NORCO/VICODIN) 5-325 MG per tablet   Oral   Take 1-2 tablets by mouth every 8 (eight) hours as needed. For pain         . Omega-3 Fatty Acids (FISH OIL) 1000 MG CAPS   Oral   Take 1 capsule by mouth daily.           BP 151/81  Pulse 72  Temp(Src) 97.8 F (36.6 C) (Oral)  Resp 20  Ht 5\' 5"  (1.651 m)  Wt 245 lb (111.131 kg)  BMI 40.77 kg/m2  SpO2 98%  Physical Exam  Nursing note and vitals reviewed. Constitutional: She is oriented to person, place, and time. She appears well-developed and well-nourished.  HENT:  Head: Normocephalic and  atraumatic.  Eyes: EOM are normal. Pupils are equal, round, and reactive to light.  Neck: Normal range of motion. Neck supple.  Cardiovascular: Normal rate, normal heart sounds and intact distal pulses.   Pulmonary/Chest: Effort normal and breath sounds normal.  Abdominal: Bowel sounds are normal. She exhibits no distension. There is no tenderness.  Musculoskeletal: She exhibits tenderness. She exhibits no edema.  Pain with ROM right arm, soft tissue tenderness R shoulder/neck  Neurological: She is alert and oriented to person, place, and time. She has normal strength. No cranial nerve deficit or sensory deficit.  Skin: Skin is warm and dry. No rash noted.  Psychiatric: She has a normal mood and affect.    ED Course  Procedures (including critical care time)  Labs Reviewed - No data to display No results found.   1. Chronic shoulder pain, right       MDM  Exacerbation of chronic pain. Given IM meds here. Last narcotic Rx was 3/20 from Dr. Kellie Simmering. Given short course of percocet and advised follow up with Dr. Romeo Apple.        Laniah Grimm B. Bernette Mayers, MD 11/22/12 1347

## 2012-11-26 ENCOUNTER — Encounter: Payer: Self-pay | Admitting: Orthopedic Surgery

## 2012-11-26 ENCOUNTER — Ambulatory Visit (INDEPENDENT_AMBULATORY_CARE_PROVIDER_SITE_OTHER): Payer: Medicaid Other | Admitting: Orthopedic Surgery

## 2012-11-26 VITALS — BP 130/82 | Ht 65.5 in | Wt 245.0 lb

## 2012-11-26 DIAGNOSIS — M75100 Unspecified rotator cuff tear or rupture of unspecified shoulder, not specified as traumatic: Secondary | ICD-10-CM | POA: Insufficient documentation

## 2012-11-26 DIAGNOSIS — M75101 Unspecified rotator cuff tear or rupture of right shoulder, not specified as traumatic: Secondary | ICD-10-CM

## 2012-11-26 DIAGNOSIS — S43429A Sprain of unspecified rotator cuff capsule, initial encounter: Secondary | ICD-10-CM

## 2012-11-26 DIAGNOSIS — M67919 Unspecified disorder of synovium and tendon, unspecified shoulder: Secondary | ICD-10-CM

## 2012-11-26 NOTE — Progress Notes (Signed)
Patient ID: [name], female   DOB: 1966-04-13, 47 y.o.   MRN: [id number] Chief Complaint  Patient presents with  . Follow-up    1 month recheck right shoulder following therapy    The patient presents back for reevaluation of her intense right shoulder pain status post cervical and lumbar fusion with a cervical fusion in January. The patient was only about 3 visits with therapy per Medicaid restrictions. This is obviously not enough therapy. Recommendations are for 6 weeks of therapy. She went to the emergency room for persistent and severe right shoulder and arm pain and neck pain.  Review of systems she is having radiation of the pain into the neck and down into the arm but not below the elbow.  Her established problem is worse we will order an MRI to evaluate for possible surgical intervention with decompression versus repair of torn tendon  CPT code 40981  Diagnosis right shoulder pain possible rotator cuff tear.

## 2012-12-07 ENCOUNTER — Telehealth: Payer: Self-pay | Admitting: Radiology

## 2012-12-07 NOTE — Telephone Encounter (Signed)
Patient has MRI at [facility] on 12-10-12 at 11:30. Patient has Medicaid, authorization # [id number] and it expires on 01-06-13. Patient will follow back up here in the office for her results.

## 2012-12-10 ENCOUNTER — Ambulatory Visit (HOSPITAL_COMMUNITY)
Admission: RE | Admit: 2012-12-10 | Discharge: 2012-12-10 | Disposition: A | Discharge status: Home / Self Care | Payer: Medicaid Other | Source: Ambulatory Visit | Attending: Orthopedic Surgery | Admitting: Orthopedic Surgery

## 2012-12-10 ENCOUNTER — Telehealth: Payer: Self-pay | Admitting: Orthopedic Surgery

## 2012-12-10 DIAGNOSIS — M75101 Unspecified rotator cuff tear or rupture of right shoulder, not specified as traumatic: Secondary | ICD-10-CM

## 2012-12-10 DIAGNOSIS — M249 Joint derangement, unspecified: Secondary | ICD-10-CM | POA: Insufficient documentation

## 2012-12-10 DIAGNOSIS — M25519 Pain in unspecified shoulder: Secondary | ICD-10-CM | POA: Insufficient documentation

## 2012-12-10 NOTE — Telephone Encounter (Signed)
Ok will call fri

## 2012-12-10 NOTE — Telephone Encounter (Signed)
Forwarding to Dr. Harrison 

## 2012-12-17 ENCOUNTER — Telehealth: Payer: Self-pay | Admitting: Orthopedic Surgery

## 2012-12-17 NOTE — Telephone Encounter (Signed)
Patient called back to follow up from call she received from Dr. Romeo Apple last Friday with her MRI results.  She said that Dr. Romeo Apple would be contacting her neurosurgeon, Dr. Gerlene Fee of [facility] (Ph [phone]), to discuss proceeding with surgery by Dr. Romeo Apple, regarding her history of neck surgery in January 2014.  She said she is hurting, therefore, is checking on status.  Please advise. Patient ph# is [phone].

## 2012-12-23 NOTE — Progress Notes (Signed)
CALLED kRITZER  THEY ARE WAITING ON mri

## 2012-12-29 ENCOUNTER — Ambulatory Visit (INDEPENDENT_AMBULATORY_CARE_PROVIDER_SITE_OTHER): Payer: Medicaid Other | Admitting: Orthopedic Surgery

## 2012-12-29 ENCOUNTER — Encounter: Payer: Self-pay | Admitting: Orthopedic Surgery

## 2012-12-29 VITALS — BP 136/92 | Ht 65.5 in | Wt 245.0 lb

## 2012-12-29 DIAGNOSIS — S43429A Sprain of unspecified rotator cuff capsule, initial encounter: Secondary | ICD-10-CM

## 2012-12-29 DIAGNOSIS — M75101 Unspecified rotator cuff tear or rupture of right shoulder, not specified as traumatic: Secondary | ICD-10-CM

## 2012-12-29 NOTE — Progress Notes (Signed)
Patient ID: [name], female   DOB: 1966-06-28, 47 y.o.   MRN: [id number] Chief Complaint  Patient presents with  . Results    MRI results    MRI report and films reviewed together. She has a partial-thickness tear on one part of her supraspinatus and a full-thickness tear anteriorly. 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  However I've spoken with Dr. Trudee Grip nurse and they have been unable to get approval for MRI of her cervical spine. I cannot manipulate the cervical spine until the issues with her neck are resolved.  Radicular pain in her right arm is still present to some degree.  She also has some lumbar spine pain for which she will receive an injection.  BP 136/92  Ht 5' 5.5" (1.664 m)  Wt 245 lb (111.131 kg)  BMI 40.14 kg/m2 General appearance is normal, the patient is alert and oriented x3 with normal mood and affect. Body habitus endomorphic  We note painful for elevation of her right shoulder to 90 and passive range of motion 150, she has grade 5-/5 strength in the supraspinatus with pain against resistance. External rotation remains normal. Shoulder stability confirmed by a negative apprehension test and normal sulcus sign. There is no tenderness over the a.c. Joint.  Right shoulder rotator cuff tear confirmed by MRI  Cervical disc disease undergoing workup for residual radicular symptoms  The patient will call us when her MRI has been improved of her cervical spine

## 2012-12-29 NOTE — Patient Instructions (Signed)
Call us when your neck situation has resolved

## 2013-02-11 ENCOUNTER — Telehealth: Payer: Self-pay | Admitting: Orthopedic Surgery

## 2013-02-11 NOTE — Telephone Encounter (Signed)
Office visit to discuss surgery? Please advise.

## 2013-02-11 NOTE — Telephone Encounter (Signed)
When we get the report are reviewed with and call her back regarding scheduling the surgery

## 2013-02-11 NOTE — Telephone Encounter (Signed)
Patient called regarding the scheduling of right shoulder surgery as noted at her office visit 12/27/12.  States she had her visit with Dr Gerlene Fee at [facility] regarding her neck, and said she was given clearance for surgery.  States a report will follow from Dr Gerlene Fee.  Patient's ph# [phone].

## 2013-02-12 NOTE — Telephone Encounter (Signed)
Patient informed of Dr. Harrison's reply. 

## 2013-03-09 ENCOUNTER — Other Ambulatory Visit: Payer: Self-pay | Admitting: *Deleted

## 2013-03-09 ENCOUNTER — Ambulatory Visit (INDEPENDENT_AMBULATORY_CARE_PROVIDER_SITE_OTHER): Payer: Medicaid Other | Admitting: Orthopedic Surgery

## 2013-03-09 VITALS — BP 156/92 | Ht 65.5 in | Wt 245.0 lb

## 2013-03-09 DIAGNOSIS — M75101 Unspecified rotator cuff tear or rupture of right shoulder, not specified as traumatic: Secondary | ICD-10-CM

## 2013-03-09 DIAGNOSIS — S43429A Sprain of unspecified rotator cuff capsule, initial encounter: Secondary | ICD-10-CM

## 2013-03-09 DIAGNOSIS — M719 Bursopathy, unspecified: Secondary | ICD-10-CM

## 2013-03-09 NOTE — Patient Instructions (Addendum)
Will be scheduled for rotator cuff repair right shoulder  You have been scheduled for surgery  Please stop all blood thinners ibuprofen Naprosyn aspirin Plavix Coumadin  You have been scheduled for rotator cuff surgery.  All surgeries carry some risk.  Remember you always have the option of continued nonsurgical treatment. However in this situation the risks vs. the benefits favor surgery as the best treatment option. The risks of the surgery includes the following but is not limited to bleeding, infection, pulmonary embolus, death from anesthesia, nerve injury vascular injury or need for further surgery, continued pain.  Specific to this procedure the following risks and complications are rare but possible Stiffness, pain, weakness, re tear.  I expect 9-12 months to fully recover from this procedure.    Surgery for Rotator Cuff Tear with Rehab Rotator cuff surgery is only recommended for individuals who have experienced persistent disability for greater than 3 months of non-surgical (conservative) treatment. Surgery is not necessary but is recommended for individuals who experience difficulty completing daily activities or athletes who are unable to compete. Rotator cuff tears do not usually heal without surgical intervention. If left alone small rotator cuff tears usually become larger. Younger athletes who have a rotator cuff tear may be recommended for surgery without attempting conservative rehabilitation. The purpose of surgery is to regain function of the shoulder joint and eliminate pain associated with the injury. In addition to repairing the tendon tear, the surgery often removes a portion of the bony roof of the shoulder (acromion) as well as the chronically thickened and inflamed membrane below the acromion (subacromial bursa). REASONS NOT TO OPERATE   Infection of the shoulder.  Inability to complete a rehabilitation program.  Patients who have other conditions (emotional or  psychological) conditions that contribute to their shoulder condition. RISKS AND COMPLICATIONS  Infection.  Re-tear of the rotator cuff tendons or muscles.  Shoulder stiffness and/or weakness.  Inability to compete in athletics.  Acromioclavicular (AC) joint paint.  Risks of surgery: infection, bleeding, nerve damage, or damage to surrounding tissues. TECHNIQUE There are different surgical procedures used to treat rotator cuff tears. The type of procedure depends on the extent of injury as well as the surgeon's preference. All of the surgical techniques for rotator cuff tears have the same goal of repairing the torn tendon, removing part of the acromion, and removing the subacromial bursa. There are two main types of procedures: arthroscopic and open incision. Arthroscopic procedures are usually completed and you go home the same day as surgery (outpatient). These procedures use multiple small incisions in which tools and a video camera are placed to work on the shoulder. An electric shaver removes the bursa, then a power burr shaves down the portion of the acromion that places pressure on the rotator cuff. Finally the rotator cuff is sewed (sutured) back to the humeral head. Open incision procedures require a larger incision. The deltoid muscle is detached from the acromion and a ligament in the shoulder (coracoacromial) is cut in order for the surgeon to access the rotator cuff. The subacromial bursa is removed as well as part of the acromion to give the rotator cuff room to move freely. The torn tendon is then sutured to the humeral head. After the rotator cuff is repaired, then the deltoid is reattached and the incision is closed up.  RECOVERY   Post-operative care depends on the surgical technique and the preferences of your therapist.  Keep the wound clean and dry for the first 10  to 14 days after surgery.  Keep your shoulder and arm in the sling provided to you for as Demerius Podolak as you have  been instructed to.  You will be given pain medications by your caregiver.  Passive (without using muscles) shoulder movements may be begun immediately after surgery.  It is important to follow through with you rehabilitation program in order to have the best possible recovery. RETURN TO SPORTS   The rehabilitation period will depend on the sport and position you play as well as the success of the operation.  The minimum recovery period is 6 months.  You must have regained complete shoulder motion and strength before returning to sports. SEEK IMMEDIATE MEDICAL CARE IF:   Any medications produce adverse side effects.  Any complications from surgery occur:  Pain, numbness, or coldness in the extremity operated upon.  Discoloration of the nail beds (they become blue or gray) of the extremity operated upon.  Signs of infections (fever, pain, inflammation, redness, or persistent bleeding).

## 2013-03-09 NOTE — Progress Notes (Signed)
Patient ID: [name], female   DOB: 01/05/1966, 47 y.o.   MRN: [id number] Chief Complaint  Patient presents with  . Follow-up    Right shoulder and discuss surgery    BP 156/92  Ht 5' 5.5" (1.664 m)  Wt 245 lb (111.131 kg)  BMI 40.14 kg/m2  Right shoulder rotator cuff tear/repair  Discussed surgery and recovery  1 year Has to do her own therapy   Results        MRI results     MRI report and films reviewed together. She has a partial-thickness tear on one part of her supraspinatus and a full-thickness tear anteriorly. 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  However I've spoken with Dr. Trudee Grip nurse and they have been unable to get approval for MRI of her cervical spine. I cannot manipulate the cervical spine until the issues with her neck are resolved.  Radicular pain in her right arm is still present to some degree.  She also has some lumbar spine pain for which she will receive an injection.  BP 136/92  Ht 5' 5.5" (1.664 m)  Wt 245 lb (111.131 kg)  BMI 40.14 kg/m2 General appearance is normal, the patient is alert and oriented x3 with normal mood and affect. Body habitus endomorphic  We note painful for elevation of her right shoulder to 90 and passive range of motion 150, she has grade 5-/5 strength in the supraspinatus with pain against resistance. External rotation remains normal. Shoulder stability confirmed by a negative apprehension test and normal sulcus sign. There is no tenderness over the a.c. Joint.  Right shoulder rotator cuff tear confirmed by MRI  Cervical disc disease undergoing workup for residual radicular symptoms  Neurosurgeon is given clearance to perform open cuff repair right shoulder

## 2013-03-11 ENCOUNTER — Encounter (HOSPITAL_COMMUNITY): Payer: Self-pay | Admitting: Pharmacy Technician

## 2013-03-26 ENCOUNTER — Encounter (HOSPITAL_COMMUNITY): Payer: Self-pay

## 2013-03-26 ENCOUNTER — Encounter (HOSPITAL_COMMUNITY)
Admission: RE | Admit: 2013-03-26 | Discharge: 2013-03-26 | Disposition: A | Discharge status: Home / Self Care | Payer: Medicaid Other | Source: Ambulatory Visit | Attending: Orthopedic Surgery | Admitting: Orthopedic Surgery

## 2013-03-26 DIAGNOSIS — Z01812 Encounter for preprocedural laboratory examination: Secondary | ICD-10-CM | POA: Insufficient documentation

## 2013-03-26 HISTORY — DX: Major depressive disorder, single episode, unspecified: F32.9

## 2013-03-26 HISTORY — DX: Depression, unspecified: F32.A

## 2013-03-26 LAB — BASIC METABOLIC PANEL
BUN: 12 mg/dL (ref 6–23)
Chloride: 99 mEq/L (ref 96–112)
GFR calc Af Amer: 89 mL/min — ABNORMAL LOW (ref >90)
GFR calc non Af Amer: 77 mL/min — ABNORMAL LOW (ref >90)
Potassium: 4.4 mEq/L (ref 3.5–5.1)
Sodium: 138 mEq/L (ref 135–145)

## 2013-03-26 LAB — HEMOGLOBIN AND HEMATOCRIT, BLOOD
HCT: 39 % (ref 36.0–46.0)
Hemoglobin: 12.9 g/dL (ref 12.0–15.0)

## 2013-03-26 NOTE — Patient Instructions (Addendum)
[name]  03/26/2013   Your procedure is scheduled on: 04/02/2013  Report to [facility] at  615  AM.  Call this number if you have problems the morning of surgery: [phone]   Remember:   Do not eat food or drink liquids after midnight.   Take these medicines the morning of surgery with A SIP OF WATER: buspar, cymbalta, protonix, percocet   Do not wear jewelry, make-up or nail polish.  Do not wear lotions, powders, or perfumes.  Do not shave 48 hours prior to surgery. Men may shave face and neck.  Do not bring valuables to the hospital.  [facility] is not responsible for any belongings or valuables.  Contacts, dentures or bridgework may not be worn into surgery.  Leave suitcase in the car. After surgery it may be brought to your room.  For patients admitted to the hospital, checkout time is 11:00 AM the day of discharge.   Patients discharged the day of surgery will not be allowed to drive home.  Name and phone number of your driver: family  Special Instructions: Shower using CHG 2 nights before surgery and the night before surgery.  If you shower the day of surgery use CHG.  Use special wash - you have one bottle of CHG for all showers.  You should use approximately 1/3 of the bottle for each shower.   Please read over the following fact sheets that you were given: Pain Booklet, Coughing and Deep Breathing, Surgical Site Infection Prevention, Anesthesia Post-op Instructions and Care and Recovery After Surgery Rotator Cuff Injury The rotator cuff is the collective set of muscles and tendons that make up the stabilizing unit of your shoulder. This unit holds in the ball of the humerus (upper arm bone) in the socket of the scapula (shoulder blade). Injuries to this stabilizing unit most commonly come from sports or activities that cause the arm to be moved repeatedly over the head. Examples of this include throwing, weight lifting, swimming, racquet sports, or an injury such as  falling on your arm. Chronic (longstanding) irritation of this unit can cause inflammation (soreness), bursitis, and eventual damage to the tendons to the point of rupture (tear). An acute (sudden) injury of the rotator cuff can result in a partial or complete tear. You may need surgery with complete tears. Small or partial rotator cuff tears may be treated conservatively with temporary immobilization, exercises and rest. Physical therapy may be needed. HOME CARE INSTRUCTIONS   Apply ice to the injury for 15-20 minutes 3-4 times per day for the first 2 days. Put the ice in a plastic bag and place a towel between the bag of ice and your skin.  If you have a shoulder immobilizer (sling and straps), do not remove it for as long as directed by your caregiver or until you see a caregiver for a follow-up examination. If you need to remove it, move your arm as little as possible.  You may want to sleep on several pillows or in a recliner at night to lessen swelling and pain.  Only take over-the-counter or prescription medicines for pain, discomfort, or fever as directed by your caregiver.  Do simple hand squeezing exercises with a soft rubber ball to decrease hand swelling. SEEK MEDICAL CARE IF:   Pain in your shoulder increases or new pain or numbness develops in your arm, hand, or fingers.  Your hand or fingers are colder than your other hand. SEEK IMMEDIATE  MEDICAL CARE IF:   Your arm, hand, or fingers are numb or tingling.  Your arm, hand, or fingers are increasingly swollen and painful, or turn white or blue. Document Released: 07/12/2000 Document Revised: 10/07/2011 Document Reviewed: 07/05/2008 [facility] Patient Information 2014 [place], [place]. PATIENT INSTRUCTIONS POST-ANESTHESIA  IMMEDIATELY FOLLOWING SURGERY:  Do not drive or operate machinery for the first twenty four hours after surgery.  Do not make any important decisions for twenty four hours after surgery or while taking narcotic  pain medications or sedatives.  If you develop intractable nausea and vomiting or a severe headache please notify your doctor immediately.  FOLLOW-UP:  Please make an appointment with your surgeon as instructed. You do not need to follow up with anesthesia unless specifically instructed to do so.  WOUND CARE INSTRUCTIONS (if applicable):  Keep a dry clean dressing on the anesthesia/puncture wound site if there is drainage.  Once the wound has quit draining you may leave it open to air.  Generally you should leave the bandage intact for twenty four hours unless there is drainage.  If the epidural site drains for more than 36-48 hours please call the anesthesia department.  QUESTIONS?:  Please feel free to call your physician or the hospital operator if you have any questions, and they will be happy to assist you.

## 2013-03-30 ENCOUNTER — Telehealth: Payer: Self-pay | Admitting: Orthopedic Surgery

## 2013-03-30 NOTE — Telephone Encounter (Signed)
Call from patient, has question about order for "wedge pillow" regarding upcoming rotator cuff repair surgery, right shoulder, 04/02/13 - states she is concerned about possibility of sleeping in recliner, and said she cannot, due to history of neck and back surgery; therefore, asking for a prescription for the wedge pillow, for her bed.  Please advise.  Patient ph # is [phone] (Mobile)

## 2013-03-31 NOTE — Telephone Encounter (Signed)
Routing to Dr Harrison 

## 2013-03-31 NOTE — Telephone Encounter (Signed)
OK to order wedge pillow

## 2013-03-31 NOTE — Telephone Encounter (Signed)
Prescription faxed to [facility] for wedge pillow.

## 2013-04-01 NOTE — H&P (Signed)
Chief Complaint   Patient presents with   .  Shoulder Pain       Right shoulder pain, no injury     History this is a 47 year old female status post cervical fusion in January of this year lumbar fusion carpal tunnels released bilaterally surgery for deviated septum sinus surgery tonsillectomy, uterine surgery.  [facility]. Family history heart disease arthritis lung disease cancer asthma and diabetes. Social history of half pack per day smoking 3 cups or glasses of caffeine 12th grade education single not employed no allergies history of irritable bowel syndrome fibromyalgia acid reflux lumbar spondylosis who presents with right shoulder pain.  No current facility-administered medications on file prior to encounter.   Current Outpatient Prescriptions on File Prior to Encounter  Medication Sig Dispense Refill  . baclofen (LIORESAL) 20 MG tablet Take 20 mg by mouth 3 (three) times daily.      Marland Kitchen docusate sodium (COLACE) 100 MG capsule Take 100 mg by mouth 2 (two) times daily.      . DULoxetine (CYMBALTA) 30 MG capsule Take 30 mg by mouth 3 (three) times daily.       Marland Kitchen EPINEPHrine (EPI-PEN) 0.3 mg/0.3 mL DEVI Inject 0.3 mg into the muscle once as needed. For bee venom      . oxyCODONE-acetaminophen (PERCOCET/ROXICET) 5-325 MG per tablet Take 1-2 tablets by mouth every 6 (six) hours as needed for pain.  15 tablet  0  . pantoprazole (PROTONIX) 40 MG tablet Take 40 mg by mouth 2 (two) times daily.         The pain started in September or October of 2013 with sudden onset. She has been treated by Dr. Kellie Simmering and also has seen a pain management specialist. She's had 4 steroid injections, tramadol 50 mg of hydrocodone 5 mg without relief  She complains of sharp dull throbbing stabbing 10 out of 10 constant pain with loss of motion in the right shoulder no trauma  She also tried Lyrica and developed visual problems as a complication  Her review of systems is  positive for fatigue stiffness depression and seasonal allergies all other systems were reviewed and were normal     Vital signs are stable as recorded  General appearance is normal  The patient is alert and oriented x3  The patient's mood and affect are normal  Gait assessment: No gait disturbances detected  The cardiovascular exam reveals normal pulses and temperature without edema or  swelling.  The lymphatic system is negative for palpable lymph nodes in the cervical spine  The sensory exam is normal.  There are no pathologic reflexes.  Balance is normal.   Exam of the left shoulder reveals normal range of motion no tenderness of stability tests were normal strength was intact skin was normal  Right shoulder no tenderness painful range of motion throughout the entire arc of motion, no instability. Drop test negative. Skin intact. No swelling. Positive impingement sign.  X-ray was done and  was negative. I personally reviewed including a report  Right shoulder pain -   MRI FINDINGS: Rotator cuff:  Severe supraspinatus tendinopathy and degeneration. Full-thickness anterior insertional tear is present.  Partial thickness component measures 16 mm.  Full-thickness component measures 9 mm.   Maximal retraction of the undersurface fibers is about 12 mm.  Infraspinatus tendinopathy without tear.  Teres minor appears normal.  Subscapularis tendinopathy without tear.FINDINGS: Rotator cuff:  Severe supraspinatus tendinopathy and degeneration. Full-thickness anterior insertional tear is present.  Partial thickness component measures 16 mm.  Full-thickness component measures 9 mm.   Maximal retraction of the undersurface fibers is about 12 mm.  Infraspinatus tendinopathy without tear.  Teres minor appears normal.  Subscapularis tendinopathy without tear. Muscles:  No atrophy or edema. Biceps long head:  Tendinopathy in the rotator interval.   Acromioclavicular Joint:  Type 2  acromion.  Moderate AC joint osteoarthritis.  Mild lateral downsloping acromion. Glenohumeral Joint:  Small glenohumeral effusion  Muscles:  No atrophy or edema. Biceps long head:  Tendinopathy in the rotator interval.   Acromioclavicular Joint:  Type 2 acromion.  Moderate AC joint osteoarthritis.  Mild lateral downsloping acromion. Glenohumeral Joint:  Small glenohumeral effusion  Right rotator cuff tear recommend open rotator cuff repair evaluate a.c. joint for possible resection

## 2013-04-02 ENCOUNTER — Encounter (HOSPITAL_COMMUNITY): Payer: Self-pay | Admitting: Anesthesiology

## 2013-04-02 ENCOUNTER — Encounter (HOSPITAL_COMMUNITY): Payer: Self-pay | Admitting: *Deleted

## 2013-04-02 ENCOUNTER — Ambulatory Visit (HOSPITAL_COMMUNITY): Payer: Medicaid Other | Admitting: Anesthesiology

## 2013-04-02 ENCOUNTER — Encounter (HOSPITAL_COMMUNITY)
Admission: RE | Disposition: A | Discharge status: Home / Self Care | Payer: Self-pay | Source: Ambulatory Visit | Attending: Orthopedic Surgery

## 2013-04-02 ENCOUNTER — Ambulatory Visit (HOSPITAL_COMMUNITY)
Admission: RE | Admit: 2013-04-02 | Discharge: 2013-04-02 | Disposition: A | Discharge status: Home / Self Care | Payer: Medicaid Other | Source: Ambulatory Visit | Attending: Orthopedic Surgery | Admitting: Orthopedic Surgery

## 2013-04-02 DIAGNOSIS — M75101 Unspecified rotator cuff tear or rupture of right shoulder, not specified as traumatic: Secondary | ICD-10-CM

## 2013-04-02 DIAGNOSIS — S43429A Sprain of unspecified rotator cuff capsule, initial encounter: Secondary | ICD-10-CM

## 2013-04-02 DIAGNOSIS — M75120 Complete rotator cuff tear or rupture of unspecified shoulder, not specified as traumatic: Secondary | ICD-10-CM | POA: Insufficient documentation

## 2013-04-02 HISTORY — PX: SHOULDER OPEN ROTATOR CUFF REPAIR: SHX2407

## 2013-04-02 SURGERY — REPAIR, ROTATOR CUFF, OPEN
Anesthesia: General | Site: Shoulder | Laterality: Right | Wound class: Clean

## 2013-04-02 MED ORDER — ONDANSETRON HCL 4 MG/2ML IJ SOLN
INTRAMUSCULAR | Status: AC
  Filled 2013-04-02: qty 2

## 2013-04-02 MED ORDER — NEOSTIGMINE METHYLSULFATE 1 MG/ML IJ SOLN
INTRAMUSCULAR | Status: AC
  Filled 2013-04-02: qty 1

## 2013-04-02 MED ORDER — BUPIVACAINE-EPINEPHRINE PF 0.5-1:200000 % IJ SOLN
INTRAMUSCULAR | Status: AC
  Filled 2013-04-02: qty 20

## 2013-04-02 MED ORDER — ROCURONIUM BROMIDE 100 MG/10ML IV SOLN
INTRAVENOUS | Status: DC | PRN
  Administered 2013-04-02: 10 mg via INTRAVENOUS
  Administered 2013-04-02: 25 mg via INTRAVENOUS
  Administered 2013-04-02: 5 mg via INTRAVENOUS

## 2013-04-02 MED ORDER — ONDANSETRON HCL 4 MG/2ML IJ SOLN: 4.0000 mg | Freq: Once | INTRAMUSCULAR | Status: DC | PRN

## 2013-04-02 MED ORDER — KETOROLAC TROMETHAMINE 30 MG/ML IJ SOLN
30.0000 mg | Freq: Once | INTRAMUSCULAR | Status: AC
  Administered 2013-04-02: 30 mg via INTRAVENOUS

## 2013-04-02 MED ORDER — FENTANYL CITRATE 0.05 MG/ML IJ SOLN
INTRAMUSCULAR | Status: AC
  Filled 2013-04-02: qty 5

## 2013-04-02 MED ORDER — GLYCOPYRROLATE 0.2 MG/ML IJ SOLN
INTRAMUSCULAR | Status: AC
  Filled 2013-04-02: qty 3

## 2013-04-02 MED ORDER — METHOCARBAMOL 500 MG PO TABS: 500.0000 mg | ORAL_TABLET | Freq: Four times a day (QID) | ORAL | Status: DC

## 2013-04-02 MED ORDER — NEOSTIGMINE METHYLSULFATE 1 MG/ML IJ SOLN
INTRAMUSCULAR | Status: DC | PRN
  Administered 2013-04-02: 3 mg via INTRAVENOUS

## 2013-04-02 MED ORDER — GLYCOPYRROLATE 0.2 MG/ML IJ SOLN
INTRAMUSCULAR | Status: DC | PRN
  Administered 2013-04-02: .6 mg via INTRAVENOUS

## 2013-04-02 MED ORDER — OXYCODONE-ACETAMINOPHEN 10-325 MG PO TABS: 1.0000 | ORAL_TABLET | ORAL | Status: DC | PRN

## 2013-04-02 MED ORDER — FENTANYL CITRATE 0.05 MG/ML IJ SOLN
INTRAMUSCULAR | Status: AC
  Filled 2013-04-02: qty 2

## 2013-04-02 MED ORDER — DEXAMETHASONE SODIUM PHOSPHATE 4 MG/ML IJ SOLN
4.0000 mg | Freq: Once | INTRAMUSCULAR | Status: AC
Start: 2013-04-02 — End: 2013-04-02
  Administered 2013-04-02: 4 mg via INTRAVENOUS

## 2013-04-02 MED ORDER — MIDAZOLAM HCL 2 MG/2ML IJ SOLN
INTRAMUSCULAR | Status: AC
  Filled 2013-04-02: qty 2

## 2013-04-02 MED ORDER — LIDOCAINE HCL (CARDIAC) 20 MG/ML IV SOLN
INTRAVENOUS | Status: DC | PRN
  Administered 2013-04-02: 30 mg via INTRAVENOUS

## 2013-04-02 MED ORDER — MIDAZOLAM HCL 2 MG/2ML IJ SOLN
1.0000 mg | INTRAMUSCULAR | Status: DC | PRN
  Administered 2013-04-02 (×2): 2 mg via INTRAVENOUS

## 2013-04-02 MED ORDER — CHLORHEXIDINE GLUCONATE 4 % EX LIQD: 60.0000 mL | Freq: Once | CUTANEOUS | Status: DC

## 2013-04-02 MED ORDER — DEXAMETHASONE SODIUM PHOSPHATE 4 MG/ML IJ SOLN
INTRAMUSCULAR | Status: AC
  Filled 2013-04-02: qty 1

## 2013-04-02 MED ORDER — OXYCODONE HCL 5 MG PO TABS
ORAL_TABLET | ORAL | Status: AC
  Filled 2013-04-02: qty 1

## 2013-04-02 MED ORDER — OXYCODONE HCL 5 MG PO TABS
5.0000 mg | ORAL_TABLET | Freq: Once | ORAL | Status: AC
  Administered 2013-04-02: 5 mg via ORAL

## 2013-04-02 MED ORDER — CEFAZOLIN SODIUM-DEXTROSE 2-3 GM-% IV SOLR
2.0000 g | INTRAVENOUS | Status: AC
  Administered 2013-04-02: 2 g via INTRAVENOUS

## 2013-04-02 MED ORDER — ONDANSETRON HCL 4 MG/2ML IJ SOLN
4.0000 mg | Freq: Once | INTRAMUSCULAR | Status: AC
  Administered 2013-04-02: 4 mg via INTRAVENOUS

## 2013-04-02 MED ORDER — ROCURONIUM BROMIDE 50 MG/5ML IV SOLN
INTRAVENOUS | Status: AC
  Filled 2013-04-02: qty 1

## 2013-04-02 MED ORDER — FENTANYL CITRATE 0.05 MG/ML IJ SOLN
INTRAMUSCULAR | Status: DC | PRN
  Administered 2013-04-02 (×7): 50 ug via INTRAVENOUS

## 2013-04-02 MED ORDER — IBUPROFEN 800 MG PO TABS: 800.0000 mg | ORAL_TABLET | Freq: Three times a day (TID) | ORAL | Status: DC | PRN

## 2013-04-02 MED ORDER — FENTANYL CITRATE 0.05 MG/ML IJ SOLN
25.0000 ug | INTRAMUSCULAR | Status: AC
  Administered 2013-04-02 (×2): 25 ug via INTRAVENOUS

## 2013-04-02 MED ORDER — SUCCINYLCHOLINE CHLORIDE 20 MG/ML IJ SOLN
INTRAMUSCULAR | Status: DC | PRN
  Administered 2013-04-02: 110 mg via INTRAVENOUS

## 2013-04-02 MED ORDER — LACTATED RINGERS IV SOLN
INTRAVENOUS | Status: DC
  Administered 2013-04-02: 07:00:00 via INTRAVENOUS

## 2013-04-02 MED ORDER — FENTANYL CITRATE 0.05 MG/ML IJ SOLN
25.0000 ug | INTRAMUSCULAR | Status: DC | PRN
  Administered 2013-04-02 (×3): 50 ug via INTRAVENOUS

## 2013-04-02 MED ORDER — CEFAZOLIN SODIUM-DEXTROSE 2-3 GM-% IV SOLR
INTRAVENOUS | Status: AC
  Filled 2013-04-02: qty 50

## 2013-04-02 MED ORDER — PROPOFOL 10 MG/ML IV BOLUS
INTRAVENOUS | Status: DC | PRN
  Administered 2013-04-02: 150 mg via INTRAVENOUS

## 2013-04-02 MED ORDER — METHOCARBAMOL 100 MG/ML IJ SOLN
500.0000 mg | Freq: Once | INTRAVENOUS | Status: AC
  Administered 2013-04-02: 500 mg via INTRAVENOUS
  Filled 2013-04-02: qty 5

## 2013-04-02 MED ORDER — KETOROLAC TROMETHAMINE 30 MG/ML IJ SOLN
INTRAMUSCULAR | Status: AC
  Filled 2013-04-02: qty 1

## 2013-04-02 MED ORDER — BUPIVACAINE-EPINEPHRINE PF 0.5-1:200000 % IJ SOLN
INTRAMUSCULAR | Status: DC | PRN
  Administered 2013-04-02: 60 mL

## 2013-04-02 MED ORDER — SODIUM CHLORIDE 0.9 % IR SOLN
Status: DC | PRN
  Administered 2013-04-02: 1000 mL

## 2013-04-02 SURGICAL SUPPLY — 70 items
ANCHOR SUT 5.5 SPEEDSCREW (Screw) ×1 IMPLANT
BIT DRILL 2.0MX128MM (BIT) ×1 IMPLANT
BLADE HEX COATED 2.75 (ELECTRODE) ×2 IMPLANT
BLADE OSC/SAGITTAL MD 9X18.5 (BLADE) ×2 IMPLANT
BLADE SURG 15 STRL LF DISP TIS (BLADE) ×1 IMPLANT
BLADE SURG 15 STRL SS (BLADE) ×2
BLADE SURG SZ10 CARB STEEL (BLADE) ×2 IMPLANT
BNDG COHESIVE 4X5 TAN NS LF (GAUZE/BANDAGES/DRESSINGS) ×2 IMPLANT
BUR OVAL CARBIDE 4.0 (BURR) ×1 IMPLANT
CHLORAPREP W/TINT 26ML (MISCELLANEOUS) ×2 IMPLANT
CLOTH BEACON ORANGE TIMEOUT ST (SAFETY) ×2 IMPLANT
COVER LIGHT HANDLE STERIS (MISCELLANEOUS) ×4 IMPLANT
COVER MAYO STAND XLG (DRAPE) ×2 IMPLANT
COVER PROBE W GEL 5X96 (DRAPES) ×2 IMPLANT
DRAPE ORTHO 2.5IN SPLIT 77X108 (DRAPES) ×2 IMPLANT
DRAPE ORTHO SPLIT 77X108 STRL (DRAPES) ×4
DRAPE PROXIMA HALF (DRAPES) ×2 IMPLANT
DRAPE U-SHAPE 47X51 STRL (DRAPES) ×2 IMPLANT
DRESSING ALLEVYN BORDER 5X5 (GAUZE/BANDAGES/DRESSINGS) ×2 IMPLANT
ELECT REM PT RETURN 9FT ADLT (ELECTROSURGICAL) ×2
ELECTRODE REM PT RTRN 9FT ADLT (ELECTROSURGICAL) ×1 IMPLANT
FORMALIN 10 PREFIL 120ML (MISCELLANEOUS) ×1 IMPLANT
GLOVE BIOGEL PI IND STRL 7.0 (GLOVE) IMPLANT
GLOVE BIOGEL PI INDICATOR 7.0 (GLOVE) ×2
GLOVE ECLIPSE 7.0 STRL STRAW (GLOVE) ×1 IMPLANT
GLOVE EXAM NITRILE MD LF STRL (GLOVE) ×1 IMPLANT
GLOVE SKINSENSE NS SZ8.0 LF (GLOVE)
GLOVE SKINSENSE STRL SZ8.0 LF (GLOVE) IMPLANT
GLOVE SS BIOGEL STRL SZ 6.5 (GLOVE) IMPLANT
GLOVE SS N UNI LF 8.5 STRL (GLOVE) ×2 IMPLANT
GLOVE SUPERSENSE BIOGEL SZ 6.5 (GLOVE) ×1
GOWN STRL REIN XL XLG (GOWN DISPOSABLE) ×6 IMPLANT
INST SET MINOR BONE (KITS) ×2 IMPLANT
KIT BLADEGUARD II DBL (SET/KITS/TRAYS/PACK) ×2 IMPLANT
KIT ROOM TURNOVER APOR (KITS) ×2 IMPLANT
MANIFOLD NEPTUNE II (INSTRUMENTS) ×2 IMPLANT
MARKER SKIN DUAL TIP RULER LAB (MISCELLANEOUS) ×2 IMPLANT
NDL HYPO 21X1.5 SAFETY (NEEDLE) ×1 IMPLANT
NDL MA TROC 1/2 (NEEDLE) IMPLANT
NDL MAYO 6 CRC TAPER PT (NEEDLE) IMPLANT
NEEDLE HYPO 21X1.5 SAFETY (NEEDLE) ×2 IMPLANT
NEEDLE MA TROC 1/2 (NEEDLE) IMPLANT
NEEDLE MAYO 6 CRC TAPER PT (NEEDLE) ×2 IMPLANT
NS IRRIG 1000ML POUR BTL (IV SOLUTION) ×2 IMPLANT
PACK BASIC III (CUSTOM PROCEDURE TRAY) ×2
PACK SRG BSC III STRL LF ECLPS (CUSTOM PROCEDURE TRAY) ×1 IMPLANT
PAD ARMBOARD 7.5X6 YLW CONV (MISCELLANEOUS) ×2 IMPLANT
PASSER SUT CAPTURE FIRST (SUTURE) IMPLANT
PENCIL HANDSWITCHING (ELECTRODE) ×2 IMPLANT
RASP SM TEAR CROSS CUT (RASP) IMPLANT
SET BASIN LINEN APH (SET/KITS/TRAYS/PACK) ×2 IMPLANT
SLING ARM FOAM STRAP LRG (SOFTGOODS) IMPLANT
SLING ARM FOAM STRAP MED (SOFTGOODS) IMPLANT
SLING ARM FOAM STRAP XLG (SOFTGOODS) ×2 IMPLANT
SMARTSTITCH PERFECTPASSER CONNECTOR ×1 IMPLANT
SPONGE LAP 18X18 X RAY DECT (DISPOSABLE) ×2 IMPLANT
STAPLER VISISTAT 35W (STAPLE) IMPLANT
STOCKINETTE IMPERVIOUS LG (DRAPES) ×2 IMPLANT
STRIP CLOSURE SKIN 1/2X4 (GAUZE/BANDAGES/DRESSINGS) IMPLANT
SUT BONE WAX W31G (SUTURE) IMPLANT
SUT ETHIBOND NAB OS 4 #2 30IN (SUTURE) ×2 IMPLANT
SUT ETHILON 3 0 FSL (SUTURE) IMPLANT
SUT MON AB 0 CT1 (SUTURE) ×2 IMPLANT
SUT MON AB 2-0 CT1 36 (SUTURE) ×2 IMPLANT
SUT PROLENE 3 0 PS 1 (SUTURE) IMPLANT
SUT SMART STITCH CARTRIDGE (SUTURE) ×1 IMPLANT
SYR 30ML LL (SYRINGE) ×2 IMPLANT
SYR BULB IRRIGATION 50ML (SYRINGE) ×4 IMPLANT
SmartStitch PerfectPasser Suture Catridge ×1 IMPLANT
YANKAUER SUCT 12FT TUBE ARGYLE (SUCTIONS) ×2 IMPLANT

## 2013-04-02 NOTE — Progress Notes (Signed)
Pt states she is under contract with Dr. Laurian Brim at the pain clinic and refused percocet prescription.  Dr. Romeo Apple aware.

## 2013-04-02 NOTE — Op Note (Signed)
04/02/2013  9:06 AM  PATIENT:  [name]  47 y.o. female  PRE-OPERATIVE DIAGNOSIS:  Right Rotator Cuff Tear  POST-OPERATIVE DIAGNOSIS:  Right Rotator Cuff Tear  Operative findings complete supraspinatus tendon tear with 1 cm of retraction the tear appeared to be old based on the rounded edges of the tendon and the glistening tendon edge  Implant ArthroCare suture anchor speech screw  PROCEDURE:  Procedure(s): ROTATOR CUFF REPAIR SHOULDER OPEN (Right)  SURGEON:  Surgeon(s) and Role:    * Vickki Hearing, MD - Primary  PHYSICIAN ASSISTANT:   ASSISTANTS: [place]   ANESTHESIA:   general  EBL:  Total I/O In: 500 [I.V.:500] Out: 30 [Blood:30]  BLOOD ADMINISTERED:none  DRAINS: none   LOCAL MEDICATIONS USED:   Marcaine 0.5% with epinephrine 60 cc  SPECIMEN:  No Specimen  DISPOSITION OF SPECIMEN:  N/A  COUNTS:  YES  TOURNIQUET:  * No tourniquets in log *  DICTATION: .Dragon Dictation  PLAN OF CARE: Discharge to home after PACU  PATIENT DISPOSITION:  PACU - hemodynamically stable.   Delay start of Pharmacological VTE agent (>24hrs) due to surgical blood loss or risk of bleeding: not applicable  Details of the procedure  After proper site marking ink site confirmation in the preop area review of chart the patient was taken to the operating room for general anesthesia she was placed on the Schlein positioner and in the modified beachchair position. Her right arm was then prepped and draped sterilely. Timeout was completed.  An incision was made over the anterolateral acromion and taken vertically between the anterior and medial deltoid. This incision was taken down to the deltoid fascia. The deltoid fascia was split and subperiosteally peeled from the anterior edge of the acromion laterally and medially. A bursectomy was then performed. Fluid was observed in the subdeltoid bursa.  The tear was noted involving supraspinatus tendon with 1 cm of retraction. The  tendon tear. The all placed on the appearance of the edges were rounded and the tendon had a glistening inch.  4 drill holes were made in the greater tuberosity a bur was used to smooth down the greater tuberosity a suture was placed in inverted mattress fashion and the tendon there was no scarring the tendon pulled over to the edge of the tuberosity very easily. A punch hole was made in the tuberosity and the ArthroCare suture anchor was placed. Per technique anchor and sutures were used to repair the cuff. The cuff pulled over nicely. The suture anchor was pulled after insertion toensure that the anchor was secure.  The arm was taken to full range of motion with no inhibition. Irrigation was performed. Subacromial space was injected. Deltoid was closed with #2 Ethibond suture. A second injection was done in the subcutaneous tissue 0 Monocryl was used to repair the subcutaneous tissue and staples were used to reapproximate the skin  Sterile dressing was applied  Sling was applied  The patient was extubated and taken recovery in stable condition.[id number]

## 2013-04-02 NOTE — Anesthesia Preprocedure Evaluation (Signed)
Anesthesia Evaluation  Patient identified by MRN, date of birth, ID band Patient awake    Reviewed: Allergy & Precautions, H&P , NPO status , Patient's Chart, lab work & pertinent test results  Airway Mallampati: I TM Distance: >3 FB Neck ROM: Full    Dental no notable dental hx.    Pulmonary    Pulmonary exam normal       Cardiovascular negative cardio ROS  Rhythm:Regular Rate:Normal     Neuro/Psych PSYCHIATRIC DISORDERS Depression    GI/Hepatic Neg liver ROS, GERD-  Medicated,  Endo/Other  Morbid obesity  Renal/GU negative Renal ROS     Musculoskeletal negative musculoskeletal ROS (+)   Abdominal (+) + obese,  Abdomen: soft.    Peds  Hematology negative hematology ROS (+)   Anesthesia Other Findings   Reproductive/Obstetrics negative OB ROS                           Anesthesia Physical Anesthesia Plan  ASA: III  Anesthesia Plan: General   Post-op Pain Management:    Induction: Intravenous, Rapid sequence and Cricoid pressure planned  Airway Management Planned: Oral ETT  Additional Equipment:   Intra-op Plan:   Post-operative Plan: Extubation in OR  Informed Consent: I have reviewed the patients History and Physical, chart, labs and discussed the procedure including the risks, benefits and alternatives for the proposed anesthesia with the patient or authorized representative who has indicated his/her understanding and acceptance.   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

## 2013-04-02 NOTE — Interval H&P Note (Signed)
History and Physical Interval Note:  04/02/2013 7:24 AM  [name]  has presented today for surgery, with the diagnosis of Right torn rotator cuff  The various methods of treatment have been discussed with the patient and family. After consideration of risks, benefits and other options for treatment, the patient has consented to  Procedure(s): ROTATOR CUFF REPAIR SHOULDER OPEN (Right) as a surgical intervention .  The patient's history has been reviewed, patient examined, no change in status, stable for surgery.  I have reviewed the patient's chart and labs.  Questions were answered to the patient's satisfaction.     Fuller Canada

## 2013-04-02 NOTE — Transfer of Care (Signed)
Immediate Anesthesia Transfer of Care Note  Patient: [name]  Procedure(s) Performed: Procedure(s): ROTATOR CUFF REPAIR SHOULDER OPEN (Right)  Patient Location: PACU  Anesthesia Type:General  Level of Consciousness: awake  Airway & Oxygen Therapy: Patient Spontanous Breathing and Patient connected to face mask oxygen  Post-op Assessment: Report given to PACU RN  Post vital signs: Reviewed  Complications: No apparent anesthesia complications

## 2013-04-02 NOTE — Anesthesia Postprocedure Evaluation (Signed)
  Anesthesia Post-op Note  Patient: [name]  Procedure(s) Performed: Procedure(s): ROTATOR CUFF REPAIR SHOULDER OPEN (Right)  Patient Location: PACU  Anesthesia Type:General  Level of Consciousness: awake, alert  and oriented  Airway and Oxygen Therapy: Patient Spontanous Breathing and Patient connected to face mask oxygen  Post-op Pain: mild  Post-op Assessment: Post-op Vital signs reviewed, Patient's Cardiovascular Status Stable, Respiratory Function Stable, Patent Airway and No signs of Nausea or vomiting  Post-op Vital Signs: Reviewed and stable   Complications: No apparent anesthesia complications

## 2013-04-02 NOTE — Brief Op Note (Addendum)
04/02/2013  9:06 AM  PATIENT:  [name]  47 y.o. female  PRE-OPERATIVE DIAGNOSIS:  Right Rotator Cuff Tear  POST-OPERATIVE DIAGNOSIS:  Right Rotator Cuff Tear  Operative findings complete supraspinatus tendon tear with 1 cm of retraction the tear appeared to be old based on the rounded edges of the tendon and the glistening tendon edge  Implant ArthroCare suture anchor speech screw  PROCEDURE:  Procedure(s): ROTATOR CUFF REPAIR SHOULDER OPEN (Right)  SURGEON:  Surgeon(s) and Role:    * Stanley E Harrison, MD - Primary  PHYSICIAN ASSISTANT:   ASSISTANTS: Betty [place]   ANESTHESIA:   general  EBL:  Total I/O In: 500 [I.V.:500] Out: 30 [Blood:30]  BLOOD ADMINISTERED:none  DRAINS: none   LOCAL MEDICATIONS USED:   Marcaine 0.5% with epinephrine 60 cc  SPECIMEN:  No Specimen  DISPOSITION OF SPECIMEN:  N/A  COUNTS:  YES  TOURNIQUET:  * No tourniquets in log *  DICTATION: .Dragon Dictation  PLAN OF CARE: Discharge to home after PACU  PATIENT DISPOSITION:  PACU - hemodynamically stable.   Delay start of Pharmacological VTE agent (>24hrs) due to surgical blood loss or risk of bleeding: not applicable  Details of the procedure  After proper site marking ink site confirmation in the preop area review of chart the patient was taken to the operating room for general anesthesia she was placed on the Schlein positioner and in the modified beachchair position. Her right arm was then prepped and draped sterilely. Timeout was completed.  An incision was made over the anterolateral acromion and taken vertically between the anterior and medial deltoid. This incision was taken down to the deltoid fascia. The deltoid fascia was split and subperiosteally peeled from the anterior edge of the acromion laterally and medially. A bursectomy was then performed. Fluid was observed in the subdeltoid bursa.  The tear was noted involving supraspinatus tendon with 1 cm of retraction. The  tendon tear. The all placed on the appearance of the edges were rounded and the tendon had a glistening inch.  4 drill holes were made in the greater tuberosity a bur was used to smooth down the greater tuberosity a suture was placed in inverted mattress fashion and the tendon there was no scarring the tendon pulled over to the edge of the tuberosity very easily. A punch hole was made in the tuberosity and the ArthroCare suture anchor was placed. Per technique anchor and sutures were used to repair the cuff. The cuff pulled over nicely. The suture anchor was pulled after insertion toensure that the anchor was secure.  The arm was taken to full range of motion with no inhibition. Irrigation was performed. Subacromial space was injected. Deltoid was closed with #2 Ethibond suture. A second injection was done in the subcutaneous tissue 0 Monocryl was used to repair the subcutaneous tissue and staples were used to reapproximate the skin  Sterile dressing was applied  Sling was applied  The patient was extubated and taken recovery in stable condition.[id number] 

## 2013-04-05 ENCOUNTER — Encounter (HOSPITAL_COMMUNITY): Payer: Self-pay | Admitting: Orthopedic Surgery

## 2013-04-05 ENCOUNTER — Ambulatory Visit (INDEPENDENT_AMBULATORY_CARE_PROVIDER_SITE_OTHER): Payer: Medicaid Other | Admitting: Orthopedic Surgery

## 2013-04-05 VITALS — BP 128/74 | Ht 65.5 in | Wt 245.0 lb

## 2013-04-05 DIAGNOSIS — S43429A Sprain of unspecified rotator cuff capsule, initial encounter: Secondary | ICD-10-CM

## 2013-04-05 DIAGNOSIS — M75101 Unspecified rotator cuff tear or rupture of right shoulder, not specified as traumatic: Secondary | ICD-10-CM

## 2013-04-05 NOTE — Patient Instructions (Signed)
Wear sling all the time do not move arm away from the body

## 2013-04-06 NOTE — Progress Notes (Signed)
Patient ID: [name], female   DOB: [birth year], 47 y.o.   MRN: [id number] Chief Complaint  Patient presents with  . Follow-up    Post op 1 Right Shoulder DOS 04/02/13    BP 128/74  Ht 5' 5.5" (1.664 m)  Wt 245 lb (111.131 kg)  BMI 40.14 kg/m2  Postop open right rotator cuff repair doing well dressing change clean wound placed in sling shot come back for suture removal and begin therapy at that time

## 2013-04-15 ENCOUNTER — Ambulatory Visit (INDEPENDENT_AMBULATORY_CARE_PROVIDER_SITE_OTHER): Payer: Self-pay | Admitting: Orthopedic Surgery

## 2013-04-15 ENCOUNTER — Encounter: Payer: Self-pay | Admitting: Orthopedic Surgery

## 2013-04-15 VITALS — BP 123/60 | Ht 65.0 in | Wt 245.0 lb

## 2013-04-15 DIAGNOSIS — M75101 Unspecified rotator cuff tear or rupture of right shoulder, not specified as traumatic: Secondary | ICD-10-CM

## 2013-04-15 DIAGNOSIS — S43429A Sprain of unspecified rotator cuff capsule, initial encounter: Secondary | ICD-10-CM

## 2013-04-15 DIAGNOSIS — Z9889 Other specified postprocedural states: Secondary | ICD-10-CM | POA: Insufficient documentation

## 2013-04-15 NOTE — Progress Notes (Signed)
Patient ID: [name], female   DOB: 11/01/1965, 47 y.o.   MRN: [id number]  Chief Complaint  Patient presents with  . Follow-up    Post op 2 Right Rotator Cuff Repair DOS 04/02/2013   HISTORY: 2 weeks post rotator cuff repair right shoulder open technique doing well at one anchor repairPRE-OPERATIVE DIAGNOSIS:  Right Rotator Cuff Tear  POST-OPERATIVE DIAGNOSIS:  Right Rotator Cuff Tear  Operative findings complete supraspinatus tendon tear with 1 cm of retraction the tear appeared to be old based on the rounded edges of the tendon and the glistening tendon edge  Implant ArthroCare suture anchor speech screw  PROCEDURE:  Procedure(s): ROTATOR CUFF REPAIR SHOULDER OPEN (Right)  Therapy [place] she'll get 3 visits total so will start Codman exercises up until 6 weeks and then 6 weeks of active assisted range of motion exercises and then she can start strengthening exercises  She'll be in her sling for an additional 4 weeks especially out of the house she can take it off in the house as long as she keeps are below 80 flexion and doesn't lift over 5 pounds  Encounter Diagnoses  Name Primary?  . Status post rotator cuff repair Yes  . Rotator cuff tear, right

## 2013-04-15 NOTE — Patient Instructions (Signed)
CALL TO ARRANGE THERAPY 

## 2013-04-19 ENCOUNTER — Telehealth: Payer: Self-pay | Admitting: Orthopedic Surgery

## 2013-04-19 NOTE — Telephone Encounter (Signed)
Patient called question about bandage, steri-strip, regarding dressing over surgical site, had rotator cuff surgery on 04/02/13; states has gotten the dressing wet.  Relayed Dr. Romeo Apple is in surgery today - routing to nurse to advise patient.  Cell phone is the only working #  At this time:  [phone].  She will be home for 30 minutes.

## 2013-04-19 NOTE — Telephone Encounter (Signed)
Called patient and advised her to come by the office today for me to change her dressing.

## 2013-04-22 ENCOUNTER — Telehealth: Payer: Self-pay | Admitting: Orthopedic Surgery

## 2013-04-22 NOTE — Telephone Encounter (Signed)
Patient called to relay that her insurance, Medicaid, will not cover therapy after the initial visit.  Had requested physical therapy at [facility] facility, and she states they have informed her of this. Patient's next appointment is 05/13/13.  Please advise.  Patient cell # is [phone].

## 2013-04-22 NOTE — Telephone Encounter (Signed)
FYI.....Marland KitchenMarland KitchenAdvised patient that she could either pay for the therapy or ask them for home exercises to do.

## 2013-04-23 NOTE — Telephone Encounter (Signed)
Ask therapist for CODMAN EXERCISES  Do those until next visit

## 2013-04-23 NOTE — Telephone Encounter (Signed)
Patient advised of Dr. Harrison's reply. 

## 2013-05-04 ENCOUNTER — Ambulatory Visit (HOSPITAL_COMMUNITY)
Admission: RE | Admit: 2013-05-04 | Discharge: 2013-05-04 | Disposition: A | Discharge status: Home / Self Care | Payer: Medicaid Other | Source: Ambulatory Visit | Attending: *Deleted | Admitting: *Deleted

## 2013-05-04 ENCOUNTER — Other Ambulatory Visit (HOSPITAL_COMMUNITY): Payer: Self-pay | Admitting: *Deleted

## 2013-05-04 DIAGNOSIS — R52 Pain, unspecified: Secondary | ICD-10-CM

## 2013-05-04 DIAGNOSIS — M7730 Calcaneal spur, unspecified foot: Secondary | ICD-10-CM | POA: Insufficient documentation

## 2013-05-04 DIAGNOSIS — M7989 Other specified soft tissue disorders: Secondary | ICD-10-CM | POA: Insufficient documentation

## 2013-05-04 DIAGNOSIS — M2010 Hallux valgus (acquired), unspecified foot: Secondary | ICD-10-CM | POA: Insufficient documentation

## 2013-05-13 ENCOUNTER — Encounter: Payer: Self-pay | Admitting: Orthopedic Surgery

## 2013-05-13 ENCOUNTER — Ambulatory Visit (INDEPENDENT_AMBULATORY_CARE_PROVIDER_SITE_OTHER): Payer: Self-pay | Admitting: Orthopedic Surgery

## 2013-05-13 VITALS — BP 122/80 | Ht 65.0 in | Wt 245.0 lb

## 2013-05-13 DIAGNOSIS — Z9889 Other specified postprocedural states: Secondary | ICD-10-CM

## 2013-05-13 DIAGNOSIS — M75101 Unspecified rotator cuff tear or rupture of right shoulder, not specified as traumatic: Secondary | ICD-10-CM

## 2013-05-13 DIAGNOSIS — S43429A Sprain of unspecified rotator cuff capsule, initial encounter: Secondary | ICD-10-CM

## 2013-05-13 NOTE — Progress Notes (Signed)
Patient ID: GENEVEIVE FURNESS, female   DOB: 1965-12-12, 48 y.o.   MRN: [id number]  Chief Complaint  Patient presents with  . Follow-up    Post op Right Rotator Cuff Repair DOS 04/02/13   BP 122/80  Ht 5\' 5"  (1.651 m)  Wt 245 lb (111.131 kg)  BMI 40.77 kg/m2  Postop week #6 status post open rotator cuff repair the right shoulder she has a 3 x 4 mm wound lateral to the actual surgical site incision most likely secondary to either tape burn or suture abscess  She has Medicaid and therefore is undergoing home therapy with passive pendulum exercises only  At 6 weeks she can now start Codman exercises and internal and external rotation strengthening exercises with therapy and  Followup in 6 weeks

## 2013-06-29 ENCOUNTER — Ambulatory Visit (INDEPENDENT_AMBULATORY_CARE_PROVIDER_SITE_OTHER): Payer: Self-pay | Admitting: Orthopedic Surgery

## 2013-06-29 VITALS — BP 132/84 | Ht 65.0 in | Wt 245.0 lb

## 2013-06-29 DIAGNOSIS — Z9889 Other specified postprocedural states: Secondary | ICD-10-CM

## 2013-06-29 DIAGNOSIS — M75101 Unspecified rotator cuff tear or rupture of right shoulder, not specified as traumatic: Secondary | ICD-10-CM

## 2013-06-29 DIAGNOSIS — S43429A Sprain of unspecified rotator cuff capsule, initial encounter: Secondary | ICD-10-CM

## 2013-06-29 NOTE — Progress Notes (Signed)
Patient ID: [name], female   DOB: [birth year], 47 y.o.   MRN: [id number]  Chief Complaint  Patient presents with  . Follow-up    6 week recheck right shoulder RCR DOS 04/02/13    Postop right shoulder rotator cuff repair doing well self therapy secondary to Medicaid; passive forward elevation full; normal external rotation; mild weakness in the rotator cuff; recommend continue strengthening exercises followup 3 months

## 2013-08-12 IMAGING — CR DG ABDOMEN 1V
2 series · 2 of 2 positions shown · non-contrast
Comparison: 05/23/2011

CLINICAL DATA: fecal impaction

ABDOMEN - 1 VIEW

[t abdomen supine (1 of 2)]
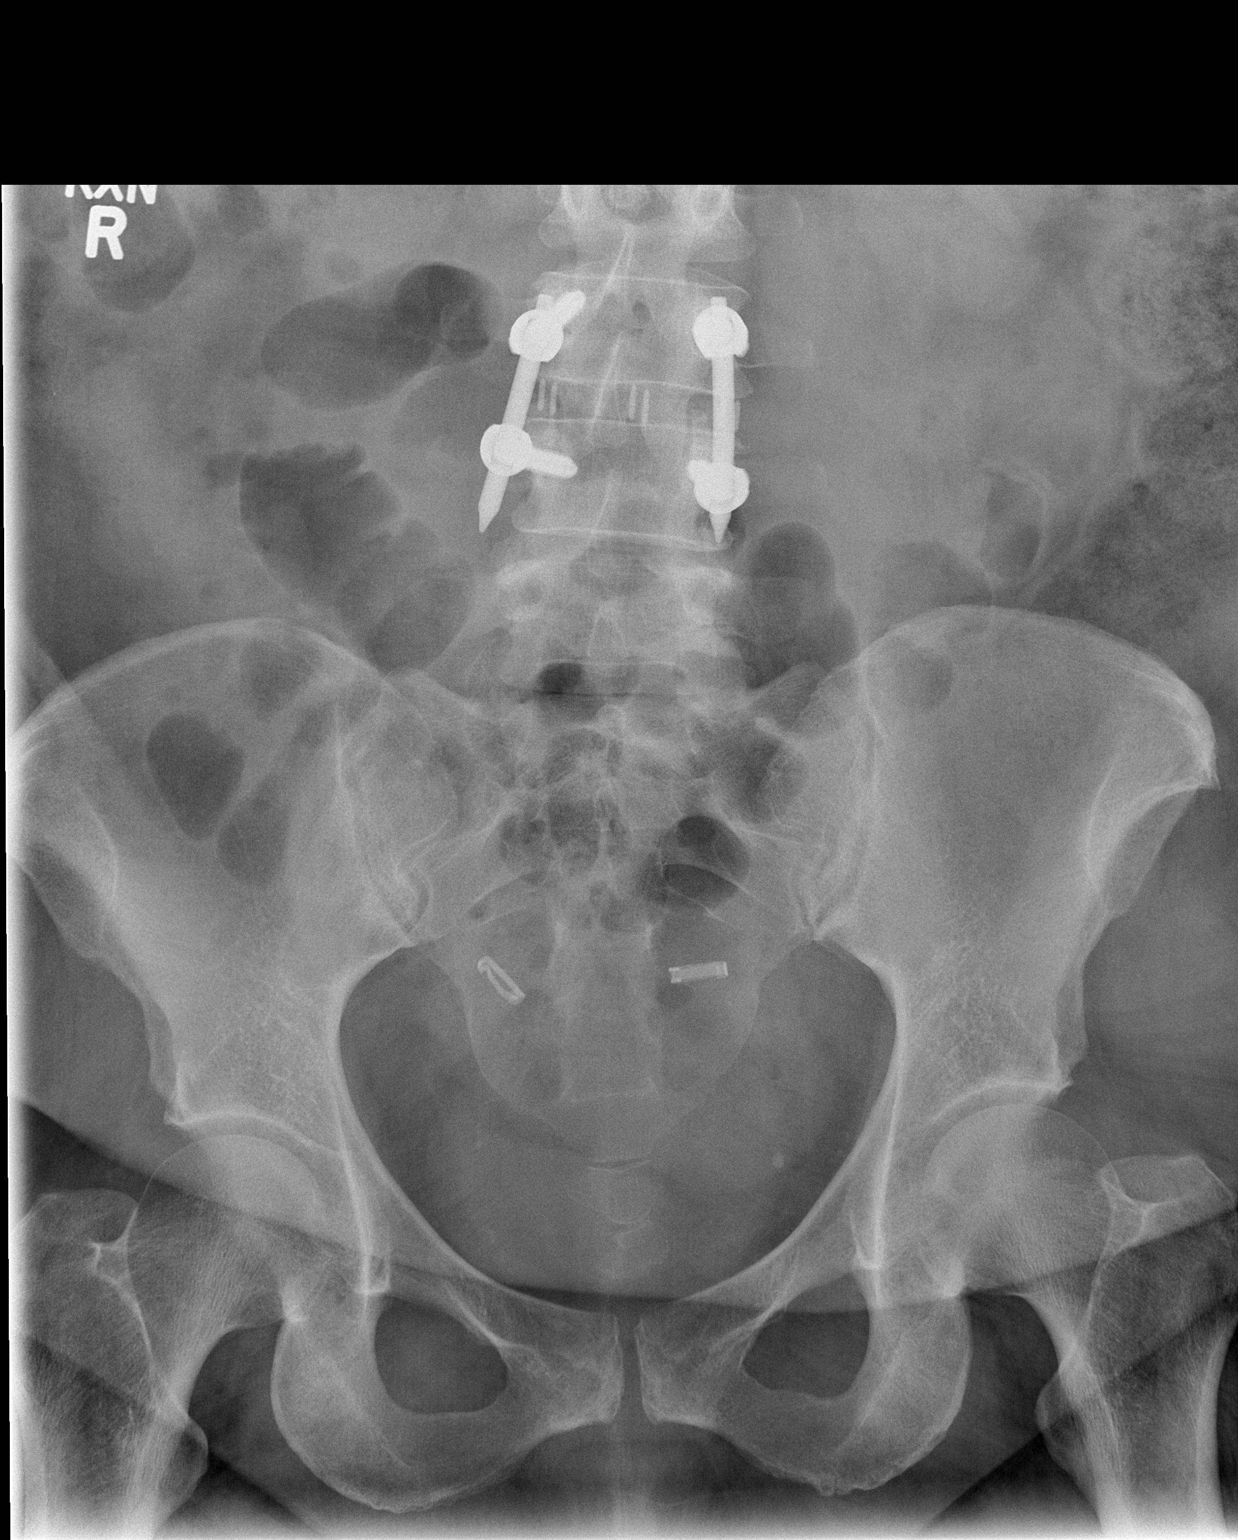

[t abdomen supine (2 of 2)]
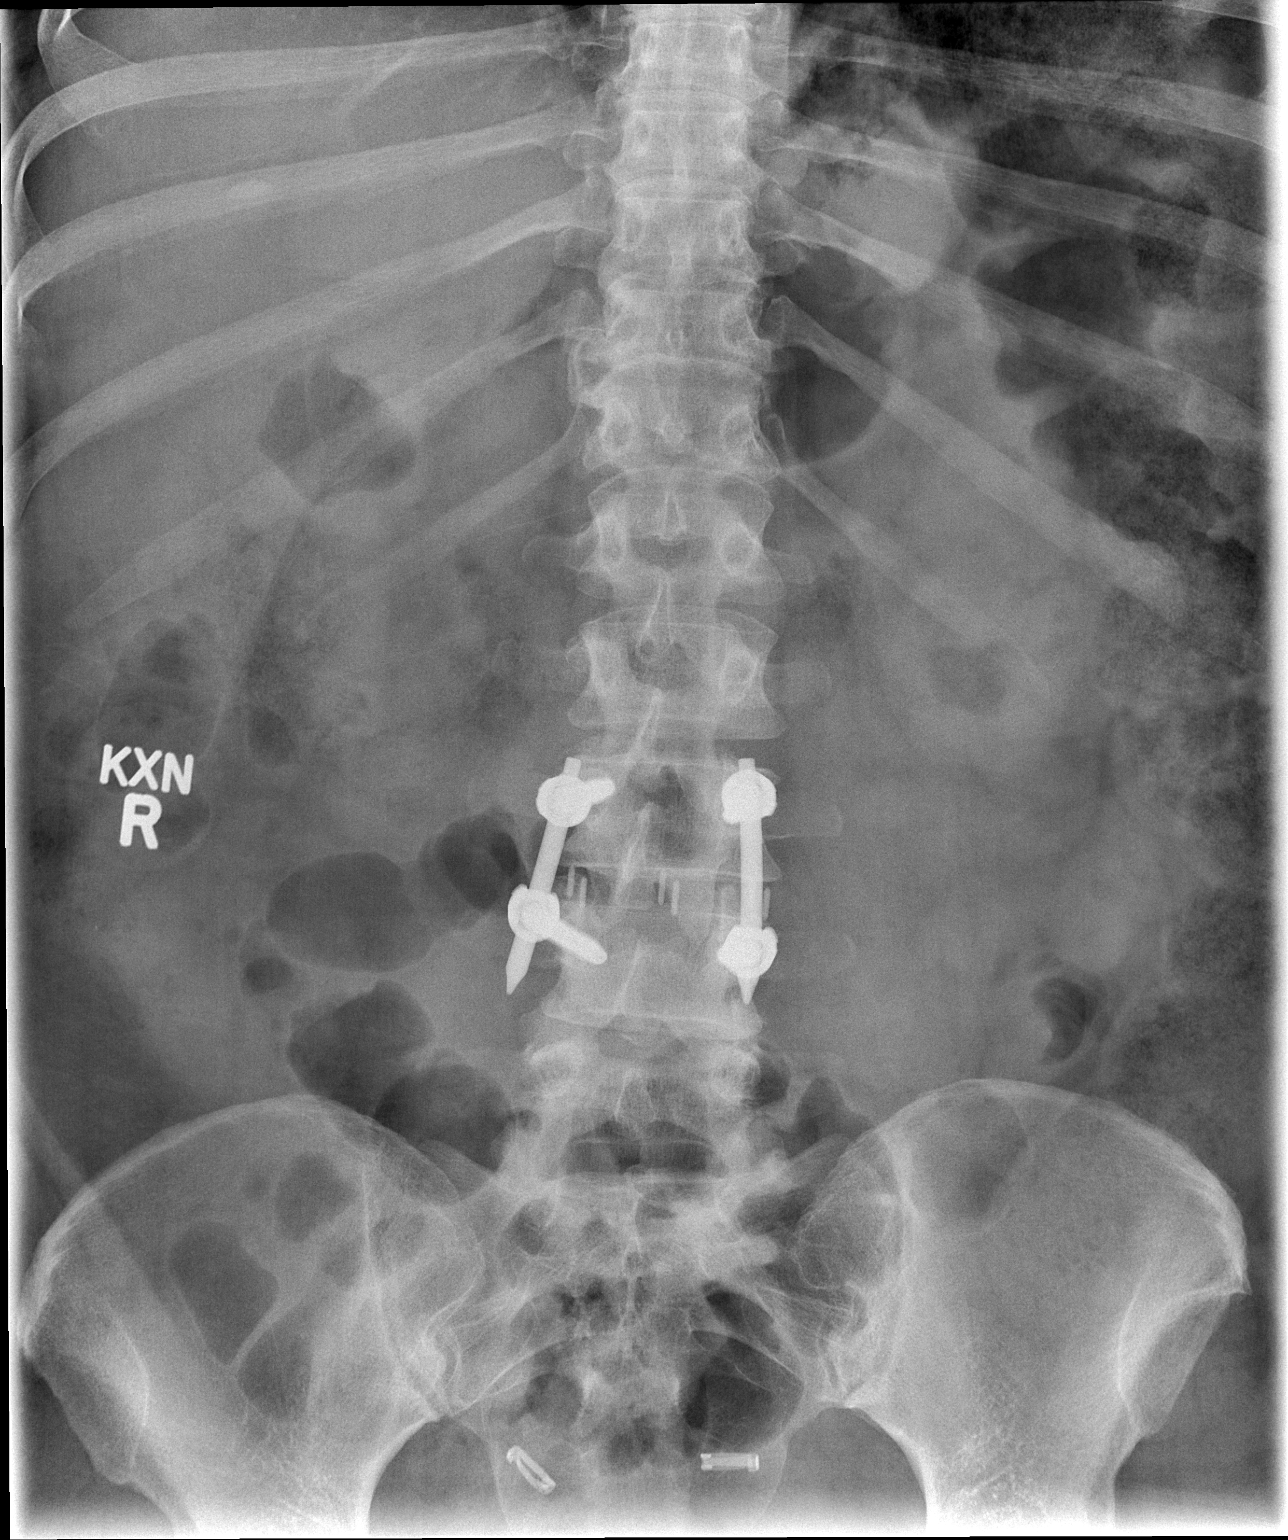

[2 of 2 positions shown; findings below may reference images not displayed]

FINDINGS: Changes of PLIF L3-4.  Tubal ligation clips are noted.
Small bowel is decompressed.  Moderate fecal material through the
ascending, transverse, and descending colon.  Rectum is
decompressed.  Bilateral pelvic phleboliths.
IMPRESSION: 1.  Nonobstructive bowel gas pattern with moderate colonic fecal
material.

## 2013-09-28 ENCOUNTER — Ambulatory Visit (INDEPENDENT_AMBULATORY_CARE_PROVIDER_SITE_OTHER): Payer: Medicaid Other | Admitting: Orthopedic Surgery

## 2013-09-28 ENCOUNTER — Encounter: Payer: Self-pay | Admitting: Orthopedic Surgery

## 2013-09-28 VITALS — BP 118/86 | Ht 65.0 in | Wt 245.0 lb

## 2013-09-28 DIAGNOSIS — Z9889 Other specified postprocedural states: Secondary | ICD-10-CM

## 2013-09-28 DIAGNOSIS — M75101 Unspecified rotator cuff tear or rupture of right shoulder, not specified as traumatic: Secondary | ICD-10-CM

## 2013-09-28 DIAGNOSIS — S43429A Sprain of unspecified rotator cuff capsule, initial encounter: Secondary | ICD-10-CM

## 2013-09-28 NOTE — Patient Instructions (Signed)
Continue exercises at home

## 2013-09-28 NOTE — Progress Notes (Signed)
Patient ID: [name], female   DOB: 1966-01-28, 48 y.o.   MRN: [id number]  Chief Complaint  Patient presents with  . Follow-up    3 month recheck right RCR DOS 04/02/2013, 6 months post op    HISTORY: 6 months postop right rotator cuff tear and repair. She could not do therapy because Medicaid only allowed 3 visits as she did a self-directed program she did very well but she has some residual weakness and deficits in terms of endurance  System review negative except for some depression  General appearance is normal, the patient is alert and oriented x3 with normal mood and affect. She has for elevation of 130 measured. She has intact strength of the rotator cuff compared to the opposite side she has a slight catching in the shoulder which I believe secondary to some weakness in active strength of the cuff. Neurovascular exam is intact skin incision healed with slight excess of scarring but there are no signs of infection  Recommend continue home therapy I we educated her on proper exercise mechanics  Followup 6 months for the 1 year followup visit

## 2013-11-17 ENCOUNTER — Other Ambulatory Visit (HOSPITAL_COMMUNITY): Payer: Self-pay | Admitting: Family Medicine

## 2013-11-17 DIAGNOSIS — Z1231 Encounter for screening mammogram for malignant neoplasm of breast: Secondary | ICD-10-CM

## 2013-11-18 ENCOUNTER — Ambulatory Visit (HOSPITAL_COMMUNITY)
Admission: RE | Admit: 2013-11-18 | Discharge: 2013-11-18 | Disposition: A | Discharge status: Home / Self Care | Payer: Medicaid Other | Source: Ambulatory Visit | Attending: Family Medicine | Admitting: Family Medicine

## 2013-11-18 DIAGNOSIS — Z1231 Encounter for screening mammogram for malignant neoplasm of breast: Secondary | ICD-10-CM

## 2013-12-13 ENCOUNTER — Encounter: Payer: Self-pay | Admitting: Cardiovascular Disease

## 2013-12-13 ENCOUNTER — Ambulatory Visit (INDEPENDENT_AMBULATORY_CARE_PROVIDER_SITE_OTHER): Payer: Medicaid Other | Admitting: Cardiovascular Disease

## 2013-12-13 VITALS — BP 130/72 | HR 80 | Ht 64.5 in | Wt 258.0 lb

## 2013-12-13 DIAGNOSIS — Z01818 Encounter for other preprocedural examination: Secondary | ICD-10-CM

## 2013-12-13 DIAGNOSIS — I739 Peripheral vascular disease, unspecified: Secondary | ICD-10-CM | POA: Insufficient documentation

## 2013-12-13 NOTE — Patient Instructions (Signed)
  We will see you back in follow up as needed.   Dr Gwenlyn Found has ordered a lower extremity arterial doppler- During this test, ultrasound is used to evaluate arterial blood flow in the legs. Allow approximately one hour for this exam.

## 2013-12-13 NOTE — Progress Notes (Signed)
12/13/2013 [name]   12/26/1965  [id number]  Primary Physician Berlin D., PA-C Primary Cardiologist: Lorretta Harp MD Renae Gloss   HPI:  Ms. [name] is a 48 year old severely overweight single Caucasian female mother of one child on disability for various orthopedic regent reasons as well as fibromyalgia referred by Dr. Melony Overly for preoperative cardiovascular evaluation prior to elective left foot surgery. Her cardiovascular risk factor profile is remarkable for 30-pack-years of tobacco abuse but otherwise is negative. He denies claudication. Papilla she needs surgery on her left foot and ABIs performed in her podiatrist's office were a telemetry abnormal. She does complain of dyspnea exertion but denies chest pain.   Current Outpatient Prescriptions  Medication Sig Dispense Refill  . busPIRone (BUSPAR) 15 MG tablet Take 15 mg by mouth 3 (three) times daily.      Marland Kitchen EPINEPHrine (EPI-PEN) 0.3 mg/0.3 mL DEVI Inject 0.3 mg into the muscle once as needed. For bee venom      . escitalopram (LEXAPRO) 10 MG tablet Take 10 mg by mouth daily.      Marland Kitchen gabapentin (NEURONTIN) 300 MG capsule Take 300 mg by mouth 3 (three) times daily.      . pantoprazole (PROTONIX) 40 MG tablet Take 40 mg by mouth 2 (two) times daily.       Marland Kitchen zolpidem (AMBIEN) 5 MG tablet Take 5 mg by mouth at bedtime as needed for sleep.       No current facility-administered medications for this visit.    Allergies  Allergen Reactions  . Bee Venom Anaphylaxis    History   Social History  . Marital Status: Single    Spouse Name: N/A    Number of Children: N/A  . Years of Education: N/A   Occupational History  . Not on file.   Social History Main Topics  . Smoking status: Current Every Day Smoker -- 1.00 packs/day for 26 years    Types: Cigarettes  . Smokeless tobacco: Never Used  . Alcohol Use: 1.0 oz/week    2 drink(s) per week     Comment: occasional  . Drug Use: No  . Sexual  Activity: Yes    Birth Control/ Protection: Surgical   Other Topics Concern  . Not on file   Social History Narrative  . No narrative on file     Review of Systems: General: negative for chills, fever, night sweats or weight changes.  Cardiovascular: negative for chest pain, dyspnea on exertion, edema, orthopnea, palpitations, paroxysmal nocturnal dyspnea or shortness of breath Dermatological: negative for rash Respiratory: negative for cough or wheezing Urologic: negative for hematuria Abdominal: negative for nausea, vomiting, diarrhea, bright red blood per rectum, melena, or hematemesis Neurologic: negative for visual changes, syncope, or dizziness All other systems reviewed and are otherwise negative except as noted above.    Blood pressure 130/72, pulse 80, height 5' 4.5" (1.638 m), weight 258 lb (117.028 kg).  General appearance: alert and no distress Neck: no adenopathy, no carotid bruit, no JVD, supple, symmetrical, trachea midline and thyroid not enlarged, symmetric, no tenderness/mass/nodules Lungs: clear to auscultation bilaterally Heart: regular rate and rhythm, S1, S2 normal, no murmur, click, rub or gallop Extremities: extremities normal, atraumatic, no cyanosis or edema and 2+ pedal pulses bilaterally  EKG not performed today  ASSESSMENT AND PLAN:   Peripheral arterial disease The patient was referred by Dr. Melony Overly from [facility] podiatry for peripheral vascular evaluation prior to elective left foot surgery because of arterial Dopplers  performed in her office and suggested monophasic waveforms. The patient's only risk factor includes 30-pack-year history of tobacco abuse smoking one pack per day and recalcitrant refractory modification. She denies claudication. She does have fibromyalgia and has diffuse pain in her legs. She has palpable pedal pulses. I'm going to get lower extremity arterial Doppler studies to obtain accurate ABI and TBIs  and determine suitability for  healing prior to her upcoming elective surgery.      Lorretta Harp MD FACP,FACC,FAHA, [facility] 12/13/2013 4:54 PM

## 2013-12-13 NOTE — Assessment & Plan Note (Addendum)
The patient was referred by Dr. Melony Overly from [facility] podiatry for peripheral vascular evaluation prior to elective left foot surgery because of arterial Dopplers performed in her office and suggested monophasic waveforms. The patient's only risk factor includes 30-pack-year history of tobacco abuse smoking one pack per day and recalcitrant refractory modification. She denies claudication. She does have fibromyalgia and has diffuse pain in her legs. She has palpable pedal pulses. I'm going to get lower extremity arterial Doppler studies to obtain accurate ABI and TBIs  and determine suitability for healing prior to her upcoming elective surgery.

## 2013-12-14 ENCOUNTER — Telehealth (HOSPITAL_COMMUNITY): Payer: Self-pay | Admitting: *Deleted

## 2013-12-15 ENCOUNTER — Telehealth (HOSPITAL_COMMUNITY): Payer: Self-pay | Admitting: *Deleted

## 2013-12-16 ENCOUNTER — Ambulatory Visit (HOSPITAL_COMMUNITY): Payer: Medicaid Other

## 2013-12-22 ENCOUNTER — Ambulatory Visit (HOSPITAL_COMMUNITY)
Admission: RE | Admit: 2013-12-22 | Discharge: 2013-12-22 | Disposition: A | Discharge status: Home / Self Care | Payer: Medicaid Other | Source: Ambulatory Visit | Attending: Internal Medicine | Admitting: Internal Medicine

## 2013-12-22 DIAGNOSIS — Z01818 Encounter for other preprocedural examination: Secondary | ICD-10-CM | POA: Insufficient documentation

## 2013-12-22 DIAGNOSIS — Z01810 Encounter for preprocedural cardiovascular examination: Secondary | ICD-10-CM

## 2013-12-22 DIAGNOSIS — I70219 Atherosclerosis of native arteries of extremities with intermittent claudication, unspecified extremity: Secondary | ICD-10-CM | POA: Insufficient documentation

## 2013-12-22 NOTE — Progress Notes (Signed)
Arterial Duplex Lower Ext. Completed. Preliminary results by tech - Normal ABIs, TBIs and Duplex Imaging study.  Oda Cogan, BS, RDMS, RVT

## 2013-12-23 ENCOUNTER — Telehealth (HOSPITAL_COMMUNITY): Payer: Self-pay | Admitting: *Deleted

## 2013-12-27 ENCOUNTER — Telehealth: Payer: Self-pay | Admitting: *Deleted

## 2013-12-27 NOTE — Telephone Encounter (Signed)
Spoke to patient. LEA DOPPLER Result given . Verbalized understanding Patient request copy of results be sent to DR [facility]- podar. - she sent patient to the office

## 2013-12-27 NOTE — Telephone Encounter (Signed)
Message copied by Raiford Simmonds on Mon Dec 27, 2013  1:50 PM ------      Message from: Lorretta Harp      Created: Sun Dec 26, 2013  4:36 PM       Call and tell normal ------

## 2014-04-12 ENCOUNTER — Ambulatory Visit (INDEPENDENT_AMBULATORY_CARE_PROVIDER_SITE_OTHER): Payer: Medicaid Other

## 2014-04-12 ENCOUNTER — Ambulatory Visit (INDEPENDENT_AMBULATORY_CARE_PROVIDER_SITE_OTHER): Payer: Medicaid Other | Admitting: Orthopedic Surgery

## 2014-04-12 VITALS — BP 131/76 | Ht 64.5 in | Wt 258.0 lb

## 2014-04-12 DIAGNOSIS — Z9889 Other specified postprocedural states: Secondary | ICD-10-CM

## 2014-04-12 DIAGNOSIS — M25519 Pain in unspecified shoulder: Secondary | ICD-10-CM

## 2014-04-12 DIAGNOSIS — M25511 Pain in right shoulder: Secondary | ICD-10-CM

## 2014-04-12 DIAGNOSIS — M67919 Unspecified disorder of synovium and tendon, unspecified shoulder: Secondary | ICD-10-CM

## 2014-04-12 DIAGNOSIS — M719 Bursopathy, unspecified: Principal | ICD-10-CM

## 2014-04-12 DIAGNOSIS — M75101 Unspecified rotator cuff tear or rupture of right shoulder, not specified as traumatic: Secondary | ICD-10-CM

## 2014-04-12 DIAGNOSIS — S43429A Sprain of unspecified rotator cuff capsule, initial encounter: Secondary | ICD-10-CM

## 2014-04-12 MED ORDER — OXYCODONE-ACETAMINOPHEN 5-325 MG PO TABS: 1.0000 | ORAL_TABLET | Freq: Four times a day (QID) | ORAL | Status: DC | PRN

## 2014-04-12 NOTE — Progress Notes (Signed)
Chief Complaint  Patient presents with  . Follow-up    1 year follow up right shoulder RCR, DOS 04/02/13... increased pain and decreased ROM    Status post rotator cuff repair right shoulder approximately one year ago comes in complains of increased pain and swelling. Pain is over the right shoulder and right arm she has movement in the arm but weakness and pain at night.  Review of systems no fevers chills or redness over the incision decreased range of motion weakness noted no neurologic symptoms at this time  Past Medical History  Diagnosis Date  . IBS (irritable bowel syndrome)   . GERD (gastroesophageal reflux disease)     IBS  . Arthritis     disk disease  . Cancer   . Vocal cord cancer   . Fibromyalgia   . Depression   . Peripheral arterial disease    Past Surgical History  Procedure Laterality Date  . Carpal tunnel release Bilateral   . Nose surgery    . Tonsillectomy    . Cleft palate repair    . Laparoscopic tubal ligation      [facility]  . Throat surgery  2000    for throat cancer/no problems [place]  . Endometrial ablation    . Anterior lat lumbar fusion  03/19/2012    Procedure: ANTERIOR LATERAL LUMBAR FUSION 1 LEVEL;  Surgeon: Faythe Ghee, MD;  Location: [place] [facility];  Service: Neurosurgery;  Laterality: Right;  Anterolateral Lumbar Interbody Fusion, Lumbar Three-Four (Hand broke through glove and nat in the room)  . Anterior cervical decomp/discectomy fusion  08/18/2012    Procedure: ANTERIOR CERVICAL DECOMPRESSION/DISCECTOMY FUSION 2 LEVELS;  Surgeon: Faythe Ghee, MD;  Location: [facility];  Service: Neurosurgery;  Laterality: N/A;  anterior cervical five-six,six-seven decompression fusion plating  . Tubal ligation    . Shoulder open rotator cuff repair Right 04/02/2013    Procedure: ROTATOR CUFF REPAIR SHOULDER OPEN;  Surgeon: Carole Civil, MD;  Location: [facility];  Service: Orthopedics;  Laterality: Right;   BP 131/76  Ht  5' 4.5" (1.638 m)  Wt 258 lb (117.028 kg)  BMI 43.62 kg/m2 She is groomed well she is oriented x3 her mood and affect are normal she walks without assistive devices   Objective:    General:  alert, cooperative and appears stated age      Right Shoulder Bruising:   absent  Crepitus:  subacromial joint  Joint Tenderness:  lateral acromial, posterior acromial, deltoid muscle  Effusion:   none present  Biceps Tendon Rupture:   absent  Winging Scapula:   negative  Adson's:  negative  Active ROM:   forward elevation 120 pain external rotation normal internal rotation pain   Neer:   positive  Hawkins  positive  Stability:   normal  Apprehension Sign:   negative/negative  Atrophy:   none noted  Strength:  supraspinatus 4 external rotation 4 internal rotation 4    Imaging X-Ray: taken and reviewed  AC Joint:no abnormalities noted  Glenohumeral joint: no abnormalities noted  Elevation humeral head: absent  Cuff arthropathy: absent    Assessment:    right shoulder bursitis after rotator cuff repair    Plan:  Shoulder exercises using Codman exercises Injection shoulder  Procedure note the subacromial injection shoulder right  Verbal consent was obtained to inject the  right  Shoulder  Timeout was completed to confirm the injection site is a subacromial space of the  right right shoulder  Medication used Depo-Medrol 40 mg and lidocaine 1% 3 cc  Anesthesia was provided by ethyl chloride  The injection was performed in the right posterior subacromial space. After pinning the skin with alcohol and anesthetized the skin with ethyl chloride the subacromial space was injected using a 20-gauge needle. There were no complications  Sterile dressing was applied.     injection right shoulder Percocet for pain As noted

## 2014-04-12 NOTE — Patient Instructions (Addendum)
Home exercises  October appointment needs to be changed from shoulder to new problem left knee

## 2014-05-03 ENCOUNTER — Ambulatory Visit (INDEPENDENT_AMBULATORY_CARE_PROVIDER_SITE_OTHER): Payer: Medicaid Other | Admitting: Orthopedic Surgery

## 2014-05-03 ENCOUNTER — Encounter: Payer: Self-pay | Admitting: Orthopedic Surgery

## 2014-05-03 ENCOUNTER — Ambulatory Visit (INDEPENDENT_AMBULATORY_CARE_PROVIDER_SITE_OTHER): Payer: Medicaid Other

## 2014-05-03 VITALS — BP 118/76 | Ht 64.5 in | Wt 258.0 lb

## 2014-05-03 DIAGNOSIS — M170 Bilateral primary osteoarthritis of knee: Secondary | ICD-10-CM

## 2014-05-03 DIAGNOSIS — M25562 Pain in left knee: Secondary | ICD-10-CM

## 2014-05-03 NOTE — Progress Notes (Signed)
Patient ID: [name], female   DOB: 1966-07-21, 48 y.o.   MRN: [id number]  Chief Complaint  Patient presents with  . Knee Pain    Left knee pain and giving out, refer. [place]    HPI [name] is a 48 y.o. female.  Presents with a new problem related to bilateral knees left worse than right x1 year with no history of trauma. She complains of pain stiffness giving way with sharp throbbing aching sensation in both knees which is constant and it 8/10. Her problem is worse when she's going up the steps and walking in general she has not had any previous treatment such as surgery, therapy or injection or even oral anti-inflammatory  She reports review of systems of fatigue ankle and leg swelling depression anxiety joint pain weakness. She is followed by the foot and ankle surgeon for difficulties with her feet.   HPI  Past Medical History  Diagnosis Date  . IBS (irritable bowel syndrome)   . GERD (gastroesophageal reflux disease)     IBS  . Arthritis     disk disease  . Cancer   . Vocal cord cancer   . Fibromyalgia   . Depression   . Peripheral arterial disease     Past Surgical History  Procedure Laterality Date  . Carpal tunnel release Bilateral   . Nose surgery    . Tonsillectomy    . Cleft palate repair    . Laparoscopic tubal ligation      [facility]  . Throat surgery  2000    for throat cancer/no problems [place]  . Endometrial ablation    . Anterior lat lumbar fusion  03/19/2012    Procedure: ANTERIOR LATERAL LUMBAR FUSION 1 LEVEL;  Surgeon: Faythe Ghee, MD;  Location: [place] [facility];  Service: Neurosurgery;  Laterality: Right;  Anterolateral Lumbar Interbody Fusion, Lumbar Three-Four (Hand broke through glove and nat in the room)  . Anterior cervical decomp/discectomy fusion  08/18/2012    Procedure: ANTERIOR CERVICAL DECOMPRESSION/DISCECTOMY FUSION 2 LEVELS;  Surgeon: Faythe Ghee, MD;  Location: [facility];  Service:  Neurosurgery;  Laterality: N/A;  anterior cervical five-six,six-seven decompression fusion plating  . Tubal ligation    . Shoulder open rotator cuff repair Right 04/02/2013    Procedure: ROTATOR CUFF REPAIR SHOULDER OPEN;  Surgeon: Carole Civil, MD;  Location: [facility];  Service: Orthopedics;  Laterality: Right;    Family History  Problem Relation Age of Onset  . Diabetes Mother   . Hypertension Mother   . Heart disease Maternal Grandfather     sudden death  . Cancer Paternal Grandmother   . Cancer Paternal Grandfather     Social History History  Substance Use Topics  . Smoking status: Current Every Day Smoker -- 1.00 packs/day for 26 years    Types: Cigarettes  . Smokeless tobacco: Never Used  . Alcohol Use: 1.0 oz/week    2 drink(s) per week     Comment: occasional    Allergies  Allergen Reactions  . Bee Venom Anaphylaxis    Current Outpatient Prescriptions  Medication Sig Dispense Refill  . busPIRone (BUSPAR) 15 MG tablet Take 15 mg by mouth 3 (three) times daily.      Marland Kitchen EPINEPHrine (EPI-PEN) 0.3 mg/0.3 mL DEVI Inject 0.3 mg into the muscle once as needed. For bee venom      . escitalopram (LEXAPRO) 10 MG tablet Take 10 mg by mouth daily.      Marland Kitchen  gabapentin (NEURONTIN) 300 MG capsule Take 300 mg by mouth 3 (three) times daily.      Marland Kitchen oxyCODONE-acetaminophen (PERCOCET/ROXICET) 5-325 MG per tablet Take 1 tablet by mouth every 6 (six) hours as needed for severe pain.  60 tablet  0  . pantoprazole (PROTONIX) 40 MG tablet Take 40 mg by mouth 2 (two) times daily.       Marland Kitchen zolpidem (AMBIEN) 5 MG tablet Take 5 mg by mouth at bedtime as needed for sleep.       No current facility-administered medications for this visit.    Review of Systems Review of Systems  Blood pressure 118/76, height 5' 4.5" (1.638 m), weight 258 lb (117.028 kg).  Physical Exam Physical Exam Her appearance is normal. BP 118/76  Ht 5' 4.5" (1.638 m)  Wt 258 lb (117.028 kg)  BMI 43.62 kg/m2 She  has some obesity she is oriented x3 mood and affect are normal she ambulates without assistive devices  Right knee first. She has tenderness in the medial lateral joint line without effusion. She has normal passive range of motion. The knee is stable strength is normal scans intact pulses are good and sensation is normal  Left knee medial and lateral joint line tenderness without effusion full passive range of motion with hyperextension. No hyperextension noted in the other knee as well. Below knee is stable strength in the knee is normal the skin is intact the pulses and temperature are normal and the sensation is normal   Data Reviewed X-ray left knee shows mild degenerative arthritis  Assessment    She has some obesity we talked about her diet. It's hard for her to exercise because of her knee problem and foot problem. She basically has mild osteoarthritis not medically managed.    Plan    Recommend diet change Avoid eating after 7, stop all sweets and use low-carb diet. No bread.  Inject both knees  Procedure note left knee injection verbal consent was obtained to inject left knee joint  Timeout was completed to confirm the site of injection  The medications used were 40 mg of Depo-Medrol and 1% lidocaine 3 cc  Anesthesia was provided by ethyl chloride and the skin was prepped with alcohol.  After cleaning the skin with alcohol a 20-gauge needle was used to inject the left knee joint. There were no complications. A sterile bandage was applied.   Procedure note right knee injection verbal consent was obtained to inject right knee joint  Timeout was completed to confirm the site of injection  The medications used were 40 mg of Depo-Medrol and 1% lidocaine 3 cc  Anesthesia was provided by ethyl chloride and the skin was prepped with alcohol.  After cleaning the skin with alcohol a 20-gauge needle was used to inject the right knee joint. There were no complications. A sterile  bandage was applied.         [name] 05/03/2014, 10:58 AM

## 2014-05-03 NOTE — Patient Instructions (Signed)
Diet: no bread, no sodas, no iced tea unless unsweet dont eat after 7 pm   You have received an injection of steroids into the joint. 15% of patients will have increased pain within the 24 hours postinjection.   This is transient and will go away.   We recommend that you use ice packs on the injection site for 20 minutes every 2 hours and extra strength Tylenol 2 tablets every 8 as needed until the pain resolves.  If you continue to have pain after taking the Tylenol and using the ice please call the office for further instructions.

## 2014-05-10 ENCOUNTER — Telehealth: Payer: Self-pay | Admitting: Orthopedic Surgery

## 2014-05-10 ENCOUNTER — Other Ambulatory Visit: Payer: Self-pay | Admitting: *Deleted

## 2014-05-10 MED ORDER — OXYCODONE-ACETAMINOPHEN 5-325 MG PO TABS: 1.0000 | ORAL_TABLET | Freq: Four times a day (QID) | ORAL | Status: DC | PRN

## 2014-05-10 NOTE — Telephone Encounter (Signed)
Prescription available, patient aware  

## 2014-05-10 NOTE — Telephone Encounter (Signed)
Patient calling asking for pain med refill, wants to pick up FridayoxyCODONE-acetaminophen (PERCOCET/ROXICET) 5-325 MG Please advise?

## 2014-05-30 ENCOUNTER — Encounter: Payer: Self-pay | Admitting: Orthopedic Surgery

## 2014-06-10 ENCOUNTER — Telehealth: Payer: Self-pay | Admitting: Orthopedic Surgery

## 2014-06-10 NOTE — Telephone Encounter (Signed)
Patient is calling stating that she is having bilateral knee pain and is asking for a refill on oxyCODONE-acetaminophen (PERCOCET/ROXICET) 5-325 MG per tablet called to [facility], please advise?

## 2014-06-13 ENCOUNTER — Other Ambulatory Visit: Payer: Self-pay | Admitting: *Deleted

## 2014-06-13 MED ORDER — OXYCODONE-ACETAMINOPHEN 5-325 MG PO TABS: 1.0000 | ORAL_TABLET | Freq: Four times a day (QID) | ORAL | Status: DC | PRN

## 2014-06-13 NOTE — Telephone Encounter (Signed)
Patient aware.

## 2014-06-13 NOTE — Telephone Encounter (Signed)
Per Dr Aline Brochure, no refill on this medication

## 2014-06-21 ENCOUNTER — Telehealth: Payer: Self-pay | Admitting: Orthopedic Surgery

## 2014-06-21 NOTE — Telephone Encounter (Signed)
PATIENT IS AWARE AND STATED SHE WAS GOING NOW

## 2014-06-21 NOTE — Telephone Encounter (Signed)
ER

## 2014-06-21 NOTE — Telephone Encounter (Signed)
Patient is calling c/o terrible bilateral knee pain and cant hardly walk please advise?

## 2014-07-04 ENCOUNTER — Telehealth: Payer: Self-pay | Admitting: Orthopedic Surgery

## 2014-07-04 NOTE — Telephone Encounter (Signed)
Left knee pain, patient states it is unbearable, please advise?

## 2014-07-05 NOTE — Telephone Encounter (Signed)
PER DR HARRISON OVER THE COUNTER TYLENOL AND ICE CALLED PATIENT, NO ANSWER

## 2014-07-06 NOTE — Telephone Encounter (Signed)
Patient called earlier and made another f/u appt for 07/11/14

## 2014-07-11 ENCOUNTER — Encounter: Payer: Self-pay | Admitting: Orthopedic Surgery

## 2014-07-11 ENCOUNTER — Ambulatory Visit (INDEPENDENT_AMBULATORY_CARE_PROVIDER_SITE_OTHER): Payer: Medicaid Other | Admitting: Orthopedic Surgery

## 2014-07-11 VITALS — BP 110/77 | Ht 64.5 in | Wt 258.0 lb

## 2014-07-11 DIAGNOSIS — M5410 Radiculopathy, site unspecified: Secondary | ICD-10-CM

## 2014-07-11 DIAGNOSIS — Z9889 Other specified postprocedural states: Secondary | ICD-10-CM

## 2014-07-11 DIAGNOSIS — M25562 Pain in left knee: Secondary | ICD-10-CM

## 2014-07-11 MED ORDER — PREDNISONE (PAK) 10 MG PO TABS: ORAL_TABLET | ORAL | Status: DC

## 2014-07-11 MED ORDER — OXYCODONE-ACETAMINOPHEN 5-325 MG PO TABS: 1.0000 | ORAL_TABLET | ORAL | Status: DC | PRN

## 2014-07-11 NOTE — Patient Instructions (Signed)
Will make appt with Dr Hal Neer thru Dr Lorriane Shire

## 2014-07-11 NOTE — Progress Notes (Signed)
Chief Complaint  Patient presents with  . Follow-up    follow up left knee pain    Encounter Diagnoses  Name Primary?  . Left knee pain   . Back pain with left-sided radiculopathy Yes  . Status post lumbar surgery     BP 110/77 mmHg  Ht 5' 4.5" (1.638 m)  Wt 258 lb (117.028 kg)  BMI 43.62 kg/m2  The patient came in with left knee pain but further questioning reveals that she has radiculitis in the left lower cheek remedy lower back pain radiating into the left knee and left lower leg. Previous x-rays were knee show no significant arthritic changes she has negative meniscal signs in the left knee  She is awake alert and oriented 3 mood and affect are flat Gen. appearance is normal ambulation is affected by the left lower extremity radicular symptoms She has palpable tenderness along the lower lumbar spine and left hip left buttocks left posterior thigh and left anterior leg with intact reflexes and pulses. Stable knee no effusion negative McMurray sign. Normal extension flexion.  Imaging studies reviewed include previous MRI back in 2013 at that time she had interval discectomy and fusion at L3-4 nonspecific fluid in the L4 pedicle screw without hardware loosening mild bulging disc at L2-3 stable disc bulge and broad-based extraforaminal disc protrusion left and bilateral facet arthritis L4-5  The knee film again shows no significant arthritis and actually looks very good  Impression back pain with left-sided radiculopathy  Recommend steroid Dosepak and Percocet for pain and then see the neurosurgeon Dr. Hal Neer

## 2014-07-15 ENCOUNTER — Ambulatory Visit (HOSPITAL_COMMUNITY): Payer: Medicaid Other | Admitting: Psychiatry

## 2014-08-01 ENCOUNTER — Encounter (HOSPITAL_BASED_OUTPATIENT_CLINIC_OR_DEPARTMENT_OTHER): Payer: Self-pay | Admitting: *Deleted

## 2014-08-01 NOTE — Progress Notes (Signed)
No labs needed Still smokes with hx vocal cord ca Denies sleep apnea

## 2014-08-03 ENCOUNTER — Other Ambulatory Visit: Payer: Self-pay | Admitting: Orthopedic Surgery

## 2014-08-04 ENCOUNTER — Ambulatory Visit (HOSPITAL_BASED_OUTPATIENT_CLINIC_OR_DEPARTMENT_OTHER): Payer: Medicaid Other | Admitting: Anesthesiology

## 2014-08-04 ENCOUNTER — Encounter (HOSPITAL_BASED_OUTPATIENT_CLINIC_OR_DEPARTMENT_OTHER)
Admission: RE | Disposition: A | Discharge status: Home / Self Care | Payer: Self-pay | Source: Ambulatory Visit | Attending: Orthopedic Surgery

## 2014-08-04 ENCOUNTER — Encounter (HOSPITAL_BASED_OUTPATIENT_CLINIC_OR_DEPARTMENT_OTHER): Payer: Self-pay | Admitting: Certified Registered"

## 2014-08-04 ENCOUNTER — Ambulatory Visit (HOSPITAL_BASED_OUTPATIENT_CLINIC_OR_DEPARTMENT_OTHER)
Admission: RE | Admit: 2014-08-04 | Discharge: 2014-08-04 | Disposition: A | Discharge status: Home / Self Care | Payer: Medicaid Other | Source: Ambulatory Visit | Attending: Orthopedic Surgery | Admitting: Orthopedic Surgery

## 2014-08-04 DIAGNOSIS — Q662 Congenital metatarsus (primus) varus: Secondary | ICD-10-CM | POA: Diagnosis not present

## 2014-08-04 DIAGNOSIS — M2012 Hallux valgus (acquired), left foot: Secondary | ICD-10-CM | POA: Diagnosis not present

## 2014-08-04 DIAGNOSIS — M25775 Osteophyte, left foot: Secondary | ICD-10-CM | POA: Diagnosis not present

## 2014-08-04 DIAGNOSIS — K219 Gastro-esophageal reflux disease without esophagitis: Secondary | ICD-10-CM | POA: Diagnosis not present

## 2014-08-04 DIAGNOSIS — I739 Peripheral vascular disease, unspecified: Secondary | ICD-10-CM | POA: Diagnosis not present

## 2014-08-04 DIAGNOSIS — Z87891 Personal history of nicotine dependence: Secondary | ICD-10-CM | POA: Insufficient documentation

## 2014-08-04 DIAGNOSIS — F329 Major depressive disorder, single episode, unspecified: Secondary | ICD-10-CM | POA: Diagnosis not present

## 2014-08-04 DIAGNOSIS — M797 Fibromyalgia: Secondary | ICD-10-CM | POA: Insufficient documentation

## 2014-08-04 HISTORY — DX: Other chronic pain: G89.29

## 2014-08-04 HISTORY — PX: BUNIONECTOMY: SHX129

## 2014-08-04 LAB — POCT HEMOGLOBIN-HEMACUE: Hemoglobin: 14 g/dL (ref 12.0–15.0)

## 2014-08-04 SURGERY — BUNIONECTOMY
Anesthesia: Regional | Site: Foot | Laterality: Left

## 2014-08-04 MED ORDER — CEFAZOLIN SODIUM-DEXTROSE 2-3 GM-% IV SOLR
INTRAVENOUS | Status: AC
  Filled 2014-08-04: qty 50

## 2014-08-04 MED ORDER — FENTANYL CITRATE 0.05 MG/ML IJ SOLN
50.0000 ug | INTRAMUSCULAR | Status: DC | PRN
  Administered 2014-08-04: 100 ug via INTRAVENOUS

## 2014-08-04 MED ORDER — MIDAZOLAM HCL 2 MG/2ML IJ SOLN
1.0000 mg | INTRAMUSCULAR | Status: DC | PRN
  Administered 2014-08-04: 2 mg via INTRAVENOUS

## 2014-08-04 MED ORDER — HYDROMORPHONE HCL 1 MG/ML IJ SOLN: 0.2500 mg | INTRAMUSCULAR | Status: DC | PRN

## 2014-08-04 MED ORDER — PROPOFOL 10 MG/ML IV BOLUS
INTRAVENOUS | Status: DC | PRN
  Administered 2014-08-04: 200 mg via INTRAVENOUS

## 2014-08-04 MED ORDER — OXYCODONE-ACETAMINOPHEN 10-325 MG PO TABS: 1.0000 | ORAL_TABLET | Freq: Four times a day (QID) | ORAL | Status: DC | PRN

## 2014-08-04 MED ORDER — FENTANYL CITRATE 0.05 MG/ML IJ SOLN
INTRAMUSCULAR | Status: DC | PRN
  Administered 2014-08-04: 25 ug via INTRAVENOUS

## 2014-08-04 MED ORDER — FENTANYL CITRATE 0.05 MG/ML IJ SOLN
INTRAMUSCULAR | Status: AC
  Filled 2014-08-04: qty 6

## 2014-08-04 MED ORDER — LIDOCAINE HCL (CARDIAC) 20 MG/ML IV SOLN
INTRAVENOUS | Status: DC | PRN
  Administered 2014-08-04: 30 mg via INTRAVENOUS

## 2014-08-04 MED ORDER — CEFAZOLIN SODIUM-DEXTROSE 2-3 GM-% IV SOLR
2.0000 g | INTRAVENOUS | Status: AC
  Administered 2014-08-04: 2 g via INTRAVENOUS

## 2014-08-04 MED ORDER — ROPIVACAINE HCL 5 MG/ML IJ SOLN
INTRAMUSCULAR | Status: DC | PRN
  Administered 2014-08-04: 40 mL via PERINEURAL

## 2014-08-04 MED ORDER — DEXAMETHASONE SODIUM PHOSPHATE 10 MG/ML IJ SOLN
INTRAMUSCULAR | Status: DC | PRN
  Administered 2014-08-04: 10 mg via INTRAVENOUS

## 2014-08-04 MED ORDER — BACITRACIN ZINC 500 UNIT/GM EX OINT
TOPICAL_OINTMENT | CUTANEOUS | Status: AC
  Filled 2014-08-04: qty 28.35

## 2014-08-04 MED ORDER — ASPIRIN EC 325 MG PO TBEC: 325.0000 mg | DELAYED_RELEASE_TABLET | Freq: Every day | ORAL | Status: DC

## 2014-08-04 MED ORDER — LACTATED RINGERS IV SOLN
INTRAVENOUS | Status: DC
  Administered 2014-08-04 (×2): via INTRAVENOUS

## 2014-08-04 MED ORDER — MIDAZOLAM HCL 2 MG/2ML IJ SOLN
INTRAMUSCULAR | Status: AC
  Filled 2014-08-04: qty 2

## 2014-08-04 MED ORDER — FENTANYL CITRATE 0.05 MG/ML IJ SOLN
INTRAMUSCULAR | Status: AC
  Filled 2014-08-04: qty 2

## 2014-08-04 MED ORDER — SODIUM CHLORIDE 0.9 % IV SOLN: INTRAVENOUS | Status: DC

## 2014-08-04 MED ORDER — BACITRACIN ZINC 500 UNIT/GM EX OINT
TOPICAL_OINTMENT | CUTANEOUS | Status: DC | PRN
  Administered 2014-08-04: 1 via TOPICAL

## 2014-08-04 MED ORDER — CHLORHEXIDINE GLUCONATE 4 % EX LIQD: 60.0000 mL | Freq: Once | CUTANEOUS | Status: DC

## 2014-08-04 MED ORDER — MIDAZOLAM HCL 5 MG/5ML IJ SOLN
INTRAMUSCULAR | Status: DC | PRN
  Administered 2014-08-04: 2 mg via INTRAVENOUS

## 2014-08-04 MED ORDER — 0.9 % SODIUM CHLORIDE (POUR BTL) OPTIME
TOPICAL | Status: DC | PRN
  Administered 2014-08-04: 300 mL

## 2014-08-04 SURGICAL SUPPLY — 85 items
BANDAGE ESMARK 6X9 LF (GAUZE/BANDAGES/DRESSINGS) ×1 IMPLANT
BIT DRILL 1.7 CANN W/AO CONN (BIT) IMPLANT
BIT DRILL 1.7 LOW PROFILE (BIT) IMPLANT
BIT DRILL 2.9 CANN QC NONSTRL (BIT) ×2 IMPLANT
BLADE AVERAGE 25MMX9MM (BLADE) ×1
BLADE AVERAGE 25X9 (BLADE) ×1 IMPLANT
BLADE MICRO SAGITTAL (BLADE) ×2 IMPLANT
BLADE MINI RND TIP GREEN BEAV (BLADE) IMPLANT
BLADE OSC/SAG .038X5.5 CUT EDG (BLADE) IMPLANT
BLADE SURG 15 STRL LF DISP TIS (BLADE) ×3 IMPLANT
BLADE SURG 15 STRL SS (BLADE) ×9
BNDG CMPR 9X4 STRL LF SNTH (GAUZE/BANDAGES/DRESSINGS)
BNDG CMPR 9X6 STRL LF SNTH (GAUZE/BANDAGES/DRESSINGS) ×1
BNDG COHESIVE 4X5 TAN STRL (GAUZE/BANDAGES/DRESSINGS) ×3 IMPLANT
BNDG COHESIVE 6X5 TAN STRL LF (GAUZE/BANDAGES/DRESSINGS) ×2 IMPLANT
BNDG CONFORM 2 STRL LF (GAUZE/BANDAGES/DRESSINGS) IMPLANT
BNDG CONFORM 3 STRL LF (GAUZE/BANDAGES/DRESSINGS) ×3 IMPLANT
BNDG ESMARK 4X9 LF (GAUZE/BANDAGES/DRESSINGS) IMPLANT
BNDG ESMARK 6X9 LF (GAUZE/BANDAGES/DRESSINGS) ×3
CHLORAPREP W/TINT 26ML (MISCELLANEOUS) ×3 IMPLANT
COVER BACK TABLE 60X90IN (DRAPES) ×3 IMPLANT
CUFF TOURNIQUET SINGLE 24IN (TOURNIQUET CUFF) IMPLANT
CUFF TOURNIQUET SINGLE 34IN LL (TOURNIQUET CUFF) ×2 IMPLANT
DRAPE EXTREMITY T 121X128X90 (DRAPE) ×3 IMPLANT
DRAPE OEC MINIVIEW 54X84 (DRAPES) ×3 IMPLANT
DRAPE SURG 17X23 STRL (DRAPES) IMPLANT
DRAPE U-SHAPE 47X51 STRL (DRAPES) ×3 IMPLANT
DRSG EMULSION OIL 3X3 NADH (GAUZE/BANDAGES/DRESSINGS) ×3 IMPLANT
DRSG PAD ABDOMINAL 8X10 ST (GAUZE/BANDAGES/DRESSINGS) ×4 IMPLANT
ELECT REM PT RETURN 9FT ADLT (ELECTROSURGICAL) ×3
ELECTRODE REM PT RTRN 9FT ADLT (ELECTROSURGICAL) ×1 IMPLANT
GAUZE SPONGE 4X4 12PLY STRL (GAUZE/BANDAGES/DRESSINGS) ×3 IMPLANT
GLOVE BIO SURGEON STRL SZ8 (GLOVE) ×3 IMPLANT
GLOVE BIOGEL PI IND STRL 7.0 (GLOVE) IMPLANT
GLOVE BIOGEL PI IND STRL 8 (GLOVE) ×1 IMPLANT
GLOVE BIOGEL PI INDICATOR 7.0 (GLOVE) ×2
GLOVE BIOGEL PI INDICATOR 8 (GLOVE) ×2
GLOVE ECLIPSE 6.5 STRL STRAW (GLOVE) ×2 IMPLANT
GLOVE EXAM NITRILE MD LF STRL (GLOVE) ×2 IMPLANT
GOWN STRL REUS W/ TWL LRG LVL3 (GOWN DISPOSABLE) ×1 IMPLANT
GOWN STRL REUS W/ TWL XL LVL3 (GOWN DISPOSABLE) ×1 IMPLANT
GOWN STRL REUS W/TWL LRG LVL3 (GOWN DISPOSABLE) ×3
GOWN STRL REUS W/TWL XL LVL3 (GOWN DISPOSABLE) ×3
GUIDEWIRE .08 (WIRE) IMPLANT
K-WIRE .045X4 (WIRE) IMPLANT
K-WIRE ACE 1.6X6 (WIRE) ×3
K-WIRE DBL END .054 LG (WIRE) ×3 IMPLANT
KWIRE ACE 1.6X6 (WIRE) IMPLANT
NDL HYPO 25X1 1.5 SAFETY (NEEDLE) IMPLANT
NEEDLE HYPO 22GX1.5 SAFETY (NEEDLE) IMPLANT
NEEDLE HYPO 25X1 1.5 SAFETY (NEEDLE) IMPLANT
NS IRRIG 1000ML POUR BTL (IV SOLUTION) ×3 IMPLANT
PACK BASIN DAY SURGERY FS (CUSTOM PROCEDURE TRAY) ×3 IMPLANT
PAD CAST 4YDX4 CTTN HI CHSV (CAST SUPPLIES) ×1 IMPLANT
PADDING CAST ABS 4INX4YD NS (CAST SUPPLIES)
PADDING CAST ABS COTTON 4X4 ST (CAST SUPPLIES) IMPLANT
PADDING CAST COTTON 4X4 STRL (CAST SUPPLIES) ×3
PADDING CAST COTTON 6X4 STRL (CAST SUPPLIES) ×2 IMPLANT
PENCIL BUTTON HOLSTER BLD 10FT (ELECTRODE) ×3 IMPLANT
SANITIZER HAND PURELL 535ML FO (MISCELLANEOUS) ×3 IMPLANT
SCREW ACE CAN 4.0 40M (Screw) ×2 IMPLANT
SCREW LOW PROFILE 3.5X48MM (Screw) ×2 IMPLANT
SHEET MEDIUM DRAPE 40X70 STRL (DRAPES) ×3 IMPLANT
SLEEVE SCD COMPRESS KNEE MED (MISCELLANEOUS) ×3 IMPLANT
SPLINT FAST PLASTER 5X30 (CAST SUPPLIES) ×40
SPLINT PLASTER CAST FAST 5X30 (CAST SUPPLIES) IMPLANT
SPONGE LAP 18X18 X RAY DECT (DISPOSABLE) ×3 IMPLANT
STOCKINETTE 6  STRL (DRAPES) ×2
STOCKINETTE 6 STRL (DRAPES) ×1 IMPLANT
SUCTION FRAZIER TIP 10 FR DISP (SUCTIONS) ×2 IMPLANT
SUT ETHILON 3 0 FSL (SUTURE) IMPLANT
SUT ETHILON 3 0 PS 1 (SUTURE) ×3 IMPLANT
SUT ETHILON 4 0 PS 2 18 (SUTURE) IMPLANT
SUT MNCRL AB 3-0 PS2 18 (SUTURE) ×3 IMPLANT
SUT VIC AB 0 SH 27 (SUTURE) IMPLANT
SUT VIC AB 2-0 SH 27 (SUTURE) ×3
SUT VIC AB 2-0 SH 27XBRD (SUTURE) IMPLANT
SUT VICRYL 4-0 PS2 18IN ABS (SUTURE) IMPLANT
SYR BULB 3OZ (MISCELLANEOUS) ×3 IMPLANT
SYR CONTROL 10ML LL (SYRINGE) IMPLANT
TOWEL OR 17X24 6PK STRL BLUE (TOWEL DISPOSABLE) ×6 IMPLANT
TUBE CONNECTING 20'X1/4 (TUBING) ×1
TUBE CONNECTING 20X1/4 (TUBING) ×1 IMPLANT
UNDERPAD 30X30 INCONTINENT (UNDERPADS AND DIAPERS) ×3 IMPLANT
YANKAUER SUCT BULB TIP NO VENT (SUCTIONS) IMPLANT

## 2014-08-04 NOTE — Anesthesia Procedure Notes (Addendum)
Anesthesia Regional Block:  Popliteal block  Pre-Anesthetic Checklist: ,, timeout performed, Correct Patient, Correct Site, Correct Laterality, Correct Procedure, Correct Position, site marked, Risks and benefits discussed, pre-op evaluation, post-op pain management  Laterality: Left  Prep: Maximum Sterile Barrier Precautions used and chloraprep       Needles:  Injection technique: Single-shot  Needle Type: Echogenic Stimulator Needle     Needle Length: 9cm 9 cm Needle Gauge: 21 and 21 G    Additional Needles:  Procedures: ultrasound guided (picture in chart) and nerve stimulator Popliteal block  Nerve Stimulator or Paresthesia:  Response: Peroneal,  Response: Tibial,   Additional Responses:   Narrative:  Start time: 08/04/2014 11:15 AM End time: 08/04/2014 11:26 AM Injection made incrementally with aspirations every 5 mL.  Performed by: Personally  Anesthesiologist: Roderic Palau E  Additional Notes: 2% Lidocaine skin wheel. Saphenous block with 10cc of 0.5% Ropivicaine plain.   Procedure Name: LMA Insertion Date/Time: 08/04/2014 12:18 PM Performed by: Georgia Delsignore Pre-anesthesia Checklist: Patient identified, Emergency Drugs available, Suction available and Patient being monitored Patient Re-evaluated:Patient Re-evaluated prior to inductionOxygen Delivery Method: Circle System Utilized Preoxygenation: Pre-oxygenation with 100% oxygen Intubation Type: IV induction Ventilation: Mask ventilation without difficulty LMA: LMA inserted LMA Size: 4.0 Number of attempts: 1 Airway Equipment and Method: bite block Placement Confirmation: positive ETCO2 Tube secured with: Tape Dental Injury: Teeth and Oropharynx as per pre-operative assessment

## 2014-08-04 NOTE — Transfer of Care (Signed)
Immediate Anesthesia Transfer of Care Note  Patient: [name]  Procedure(s) Performed: Procedure(s): LEFT FOOT LAPIDUS BUNION CORRECTION, LEFT SECOND TARSALMETATARSAL JOINT DEBRIDEMENT AND LEFT MODIFIED MCBRIDE BUNIONECTOMY (Left)  Patient Location: PACU  Anesthesia Type:GA combined with regional for post-op pain  Level of Consciousness: awake, alert , oriented and patient cooperative  Airway & Oxygen Therapy: Patient Spontanous Breathing and Patient connected to face mask oxygen  Post-op Assessment: Report given to PACU RN and Post -op Vital signs reviewed and stable  Post vital signs: Reviewed and stable  Complications: No apparent anesthesia complications

## 2014-08-04 NOTE — Anesthesia Postprocedure Evaluation (Signed)
  Anesthesia Post-op Note  Patient: [name]  Procedure(s) Performed: Procedure(s): LEFT FOOT LAPIDUS BUNION CORRECTION, LEFT SECOND TARSALMETATARSAL JOINT DEBRIDEMENT AND LEFT MODIFIED MCBRIDE BUNIONECTOMY (Left)  Patient Location: PACU  Anesthesia Type: General, Regional   Level of Consciousness: awake, alert  and oriented  Airway and Oxygen Therapy: Patient Spontanous Breathing  Post-op Pain: none  Post-op Assessment: Post-op Vital signs reviewed  Post-op Vital Signs: Reviewed  Last Vitals:  Filed Vitals:   08/04/14 1431  BP: 135/73  Pulse: 65  Temp: 36.4 C  Resp: 18    Complications: No apparent anesthesia complications

## 2014-08-04 NOTE — Anesthesia Preprocedure Evaluation (Addendum)
Anesthesia Evaluation  Patient identified by MRN, date of birth, ID band Patient awake    Reviewed: Allergy & Precautions, H&P , NPO status , Patient's Chart, lab work & pertinent test results  Airway Mallampati: I  TM Distance: >3 FB Neck ROM: Full    Dental no notable dental hx. (+) Teeth Intact, Dental Advisory Given   Pulmonary neg pulmonary ROS, former smoker,  breath sounds clear to auscultation  Pulmonary exam normal       Cardiovascular + Peripheral Vascular Disease negative cardio ROS  Rhythm:Regular Rate:Normal     Neuro/Psych Depression negative neurological ROS     GI/Hepatic Neg liver ROS, GERD-  Medicated and Controlled,  Endo/Other  Morbid obesity  Renal/GU negative Renal ROS  negative genitourinary   Musculoskeletal  (+) Arthritis -, Osteoarthritis,  Fibromyalgia -  Abdominal   Peds  Hematology negative hematology ROS (+)   Anesthesia Other Findings   Reproductive/Obstetrics negative OB ROS                            Anesthesia Physical Anesthesia Plan  ASA: III  Anesthesia Plan: General and Regional   Post-op Pain Management:    Induction: Intravenous  Airway Management Planned: LMA  Additional Equipment:   Intra-op Plan:   Post-operative Plan: Extubation in OR  Informed Consent: I have reviewed the patients History and Physical, chart, labs and discussed the procedure including the risks, benefits and alternatives for the proposed anesthesia with the patient or authorized representative who has indicated his/her understanding and acceptance.   Dental advisory given  Plan Discussed with: CRNA  Anesthesia Plan Comments:         Anesthesia Quick Evaluation

## 2014-08-04 NOTE — Progress Notes (Signed)
Assisted Dr. Edmond Fitzgerald with left, ultrasound guided, popliteal block. Side rails up, monitors on throughout procedure. See vital signs in flow sheet. Tolerated Procedure well. °

## 2014-08-04 NOTE — Brief Op Note (Signed)
08/04/2014  1:50 PM  PATIENT:  Arvil Persons  49 y.o. female  PRE-OPERATIVE DIAGNOSIS:  LEFT HALLUX VALGUS AND SECOND TMT joint dorsal osteophytes  POST-OPERATIVE DIAGNOSIS:  same  Procedure(s): 1.  LEFT FOOT LAPIDUS BUNION CORRECTION  2.  LEFT SECOND TMT dorsal exostectomy 3.  LEFT MODIFIED MCBRIDE BUNIONECTOMY through a separate incision 4.  Left foot ap and lateral xray  SURGEON:  [name], MD  ASSISTANT: n/a  ANESTHESIA:   General, regional  EBL:  minimal   TOURNIQUET:  59 min at 235 mm Hg  COMPLICATIONS:  None apparent  DISPOSITION:  Extubated, awake and stable to recovery.  DICTATION ID:  [id number]

## 2014-08-04 NOTE — Discharge Instructions (Signed)
Samantha Simmer, MD [name]  Please read the following information regarding your care after surgery.  Medications  You only need a prescription for the narcotic pain medicine (ex. oxycodone, Percocet, Norco).  All of the other medicines listed below are available over the counter. X Percocet as prescribed for moderate to severe pain   Narcotic pain medicine (ex. oxycodone, Percocet, Vicodin) will cause constipation.  To prevent this problem, take the following medicines while you are taking any pain medicine. X docusate sodium (Colace) 100 mg twice a day X senna (Senokot) 2 tablets twice a day  X To help prevent blood clots, take an aspirin (325 mg) once a day for a month after surgery.  You should also get up every hour while you are awake to move around.    Weight Bearing X Do not bear any weight on the operated leg or foot.  Cast / Splint / Dressing X Keep your splint or cast clean and dry.  Dont put anything (coat hanger, pencil, etc) down inside of it.  If it gets damp, use a hair dryer on the cool setting to dry it.  If it gets soaked, call the office to schedule an appointment for a cast change.  After your dressing, cast or splint is removed; you may shower, but do not soak or scrub the wound.  Allow the water to run over it, and then gently pat it dry.  Swelling It is normal for you to have swelling where you had surgery.  To reduce swelling and pain, keep your toes above your nose for at least 3 days after surgery.  It may be necessary to keep your foot or leg elevated for several weeks.  If it hurts, it should be elevated.  Follow Up Call my office at [phone] when you are discharged from the hospital or surgery center to schedule an appointment to be seen two weeks after surgery.  Call my office at [phone] if you develop a fever >101.5 F, nausea, vomiting, bleeding from the surgical site or severe pain.     Post Anesthesia Home Care  Instructions  Activity: Get plenty of rest for the remainder of the day. A responsible adult should stay with you for 24 hours following the procedure.  For the next 24 hours, DO NOT: -Drive a car -Paediatric nurse -Drink alcoholic beverages -Take any medication unless instructed by your physician -Make any legal decisions or sign important papers.  Meals: Start with liquid foods such as gelatin or soup. Progress to regular foods as tolerated. Avoid greasy, spicy, heavy foods. If nausea and/or vomiting occur, drink only clear liquids until the nausea and/or vomiting subsides. Call your physician if vomiting continues.  Special Instructions/Symptoms: Your throat may feel dry or sore from the anesthesia or the breathing tube placed in your throat during surgery. If this causes discomfort, gargle with warm salt water. The discomfort should disappear within 24 hours.   Regional Anesthesia Blocks  1. Numbness or the inability to move the "blocked" extremity may last from 3-48 hours after placement. The length of time depends on the medication injected and your individual response to the medication. If the numbness is not going away after 48 hours, call your surgeon.  2. The extremity that is blocked will need to be protected until the numbness is gone and the  Strength has returned. Because you cannot feel it, you will need to take extra care to avoid injury. Because it may be weak, you may have  difficulty moving it or using it. You may not know what position it is in without looking at it while the block is in effect.  3. For blocks in the legs and feet, returning to weight bearing and walking needs to be done carefully. You will need to wait until the numbness is entirely gone and the strength has returned. You should be able to move your leg and foot normally before you try and bear weight or walk. You will need someone to be with you when you first try to ensure you do not fall and possibly  risk injury.  4. Bruising and tenderness at the needle site are common side effects and will resolve in a few days.  5. Persistent numbness or new problems with movement should be communicated to the surgeon or the [name] [phone] [place] [phone]).

## 2014-08-04 NOTE — H&P (Signed)
[name] is an 49 y.o. female.   Chief Complaint:  Left hallux valgus HPI:  49 y/o female with painful left bunion deformity and 2nd TMT joint bone spur.  She has failed non operative treatment and presents now for surgical correction.  Past Medical History  Diagnosis Date  . IBS (irritable bowel syndrome)   . GERD (gastroesophageal reflux disease)     IBS  . Arthritis     disk disease  . Cancer   . Vocal cord cancer   . Fibromyalgia   . Depression   . Peripheral arterial disease   . Chronic pain     Past Surgical History  Procedure Laterality Date  . Carpal tunnel release Bilateral   . Nose surgery    . Tonsillectomy    . Cleft palate repair    . Laparoscopic tubal ligation      [facility]  . Throat surgery  2000    for throat cancer/no problems [place]  . Endometrial ablation    . Anterior lat lumbar fusion  03/19/2012    Procedure: ANTERIOR LATERAL LUMBAR FUSION 1 LEVEL;  Surgeon: Faythe Ghee, MD;  Location: [place] [facility];  Service: Neurosurgery;  Laterality: Right;  Anterolateral Lumbar Interbody Fusion, Lumbar Three-Four (Hand broke through glove and nat in the room)  . Anterior cervical decomp/discectomy fusion  08/18/2012    Procedure: ANTERIOR CERVICAL DECOMPRESSION/DISCECTOMY FUSION 2 LEVELS;  Surgeon: Faythe Ghee, MD;  Location: [facility];  Service: Neurosurgery;  Laterality: N/A;  anterior cervical five-six,six-seven decompression fusion plating  . Tubal ligation    . Shoulder open rotator cuff repair Right 04/02/2013    Procedure: ROTATOR CUFF REPAIR SHOULDER OPEN;  Surgeon: Carole Civil, MD;  Location: [facility];  Service: Orthopedics;  Laterality: Right;    Family History  Problem Relation Age of Onset  . Diabetes Mother   . Hypertension Mother   . Heart disease Maternal Grandfather     sudden death  . Cancer Paternal Grandmother   . Cancer Paternal Grandfather    Social History:  reports that she quit smoking  about 4 weeks ago. Her smoking use included Cigarettes. She has a 26 pack-year smoking history. She has never used smokeless tobacco. She reports that she drinks about 1.0 oz of alcohol per week. She reports that she does not use illicit drugs.  Allergies:  Allergies  Allergen Reactions  . Bee Venom Anaphylaxis    Medications Prior to Admission  Medication Sig Dispense Refill  . ALPRAZolam (XANAX) 0.25 MG tablet Take 0.25 mg by mouth at bedtime as needed for anxiety.    Marland Kitchen amphetamine-dextroamphetamine (ADDERALL XR) 20 MG 24 hr capsule Take 10 mg by mouth 3 (three) times daily.    Marland Kitchen escitalopram (LEXAPRO) 10 MG tablet Take 10 mg by mouth daily.    . fesoterodine (TOVIAZ) 4 MG TB24 tablet Take 4 mg by mouth daily.    Marland Kitchen gabapentin (NEURONTIN) 300 MG capsule Take 300 mg by mouth 3 (three) times daily.    . naproxen (NAPROSYN) 500 MG tablet Take 500 mg by mouth 2 (two) times daily with a meal.    . pantoprazole (PROTONIX) 40 MG tablet Take 40 mg by mouth 2 (two) times daily.     . varenicline (CHANTIX) 0.5 MG tablet Take 0.5 mg by mouth 2 (two) times daily.    Marland Kitchen EPINEPHrine (EPI-PEN) 0.3 mg/0.3 mL DEVI Inject 0.3 mg into the muscle once as needed. For bee venom    .  oxyCODONE-acetaminophen (PERCOCET/ROXICET) 5-325 MG per tablet Take 1 tablet by mouth every 4 (four) hours as needed for severe pain. 60 tablet 0    Results for orders placed or performed during the hospital encounter of 11-Aug-2014 (from the past 48 hour(s))  Hemoglobin-hemacue, POC     Status: None   Collection Time: 08/11/14 11:04 AM  Result Value Ref Range   Hemoglobin 14.0 12.0 - 15.0 g/dL   No results found.  ROS  No recent f/c/n/v/wt loss  Blood pressure 138/77, pulse 70, temperature 98.4 F (36.9 C), temperature source Oral, resp. rate 19, height 5\' 5"  (1.651 m), weight 118.389 kg (261 lb), SpO2 99 %. Physical Exam  [age] wd female in nad.  A and O x 4.  Mood and affect normal.  EOMI.  Resp unlabored.  L foot with moderate  bunion deformity.  Skin healthy and intact.  Sens to LT intact.  5/5 strength in PF and Df of the ankle and toes.  No lymphadenopathy.  Assessment/Plan L hallux valgus, metatarsus primus varus and 2nd tmt joint bone spur - to OR for lapidus bunion correction, modified mcbride bunionectomy and dorsal exostectomy.  The risks and benefits of the alternative treatment options have been discussed in detail.  The patient wishes to proceed with surgery and specifically understands risks of bleeding, infection, nerve damage, blood clots, need for additional surgery, amputation and death.   [name] 11-Aug-2014, 11:42 AM

## 2014-08-05 ENCOUNTER — Encounter (HOSPITAL_BASED_OUTPATIENT_CLINIC_OR_DEPARTMENT_OTHER): Payer: Self-pay | Admitting: Orthopedic Surgery

## 2014-08-05 NOTE — Op Note (Signed)
[name], [name] NO.:  [id number]  MEDICAL RECORD NO.:  [id number]  LOCATION:                                 FACILITY:  PHYSICIAN:  [name], MD             DATE OF BIRTH:  DATE OF PROCEDURE:  08/04/2014 DATE OF DISCHARGE:                              OPERATIVE REPORT   PREOPERATIVE DIAGNOSES: 1. Left hallux valgus. 2. Left metatarsus primus varus. 3. Left second tarsometatarsal joint dorsal osteophytes.  POSTOPERATIVE DIAGNOSES: 1. Left hallux valgus. 2. Left metatarsus primus varus. 3. Left second tarsometatarsal joint dorsal osteophytes.  PROCEDURES: 1. Left foot Lapidus bunion correction. 2. Left modified McBride bunionectomy through a separate incision. 3. Left second TMT joint dorsal exostectomy. 4. Left foot AP and lateral radiographs.  SURGEON:  [name], MD.  ANESTHESIA:  General, regional.  ESTIMATED BLOOD LOSS:  Minimal.  TOURNIQUET TIME:  59 minutes at 300 mmHg.  COMPLICATIONS:  None apparent.  DISPOSITION:  Extubated, awake, and stable to recovery.  INDICATIONS FOR PROCEDURE:  The patient is a 49 year old woman who complains of a painful left bunion deformity for many years.  She also has some painful osteophytes at the dorsum of the midfoot.  She has failed nonoperative treatment today and presents now for operative correction of these painful deformities.  She understands the risks and benefits, the alternative treatment options, and elects surgical treatment.  She specifically understands risks of bleeding, infection, nerve damage, blood clots, need for additional surgery, continued pain, recurrence of her deformity, amputation, and death.  PROCEDURE IN DETAIL:  After preoperative consent was obtained and the correct operative site was identified, the patient was brought to the operating room and placed on the operating table.  General anesthesia was induced.  Preoperative antibiotics were administered.  Surgical  time- out was taken.  Left lower extremity was prepped and draped in standard sterile fashion with tourniquet around the thigh.  The extremity was exsanguinated and the tourniquet was inflated to 300 mmHg.  A longitudinal incision was then made over the first webspace of the dorsum.  Sharp dissection was carried down through skin and subcutaneous tissue.  The intermetatarsal ligament was divided under direct vision. The lateral sesamoid was identified and arthrotomy was made between the sesamoid and the metatarsal head.  Lateral joint capsule was then perforated in several places with a #15 blade.  The hallux could be positioned in 20 degrees of varus passively at the level of the MP joint.  Attention was then turned to the medial aspect of the forefoot where longitudinal incision was then made over the medial eminence.  Sharp dissection was carried down through the skin and subcutaneous tissue. The joint capsule was incised longitudinally and elevated plantarly and dorsally exposing the hypertrophic medial eminence.  The oscillating saw was used to resect the medial eminence in line with the first metatarsal.  Attention was then turned to the dorsum of the midfoot where a longitudinal incision was made over the first TMT joint.  Sharp dissection was carried down through skin and subcutaneous tissue.  The joint capsule over the first TMT joint was identified and  was incised. It was elevated medially and laterally exposing the first TMT joint.  An oscillating saw was then used to resect the articular cartilage and subchondral bone on both sides of the joint bevelling the cut to take more bone laterally than medially.  The joint was then reduced correcting the intermetatarsal angle.  The joint was provisionally pinned.  AP and lateral radiographs confirmed appropriate correction of the intermetatarsal angle and appropriate position of the guide pin.  A guide pin was then over drilled  and a Biomet 4 mm partially threaded cannulated screw was inserted.  This was noted to have excellent purchase.  A second guide pin was then placed from distal to proximal. The guide pin was over drilled and a countersink was used to create a pocket for the head of the screw.  The guide pin was removed and a solid 3.5 mm low-profile screw was inserted.  The was again noted to have excellent purchase.  AP and lateral radiographs confirmed appropriate position and length of both screws and appropriate reduction of the first TMT joint.  The second TMT joint was then exposed with subperiosteal dissection.  The dorsal osteophytes were identified and were resected with a rongeur.  The joint itself was inspected and there was no evidence of any loose body.  There were degenerative changes noted to the joint.  Wounds were irrigated copiously.  The medial joint capsule was then repaired with imbricating sutures of 2-0 Vicryl.  The skin incision was closed then with Monocryl followed by nylon.  Dorsally the incision was closed with Monocryl and nylon.  The proximal incision in the retinaculum was closed with Vicryl, subcutaneous tissue with Monocryl the skin with nylon.  Sterile dressings were applied followed by a bunion wrap and a well-padded short-leg splint.  Tourniquet was released at 59 minutes after application of the dressings.  The patient was awakened from anesthesia and transported to the recovery room in stable condition.  FOLLOWUP PLAN:  The patient will be nonweightbearing on the left lower extremity.  She will follow up with me in 2-3 weeks for suture removal and conversion to a short leg cast.  X-RAYS:  AP and lateral radiographs of the left foot were obtained intraoperatively.  These show interval correction of the intermetatarsal and hallux valgus angles.  Hardware was appropriately positioned and of the appropriate length.  No acute injuries noted.  Dorsal osteophyte superior  adequately resected at the level of the second TMT joint.     [name], MD    JH/MEDQ  D:  08/04/2014  T:  08/05/2014  Job:  [id number]

## 2014-08-11 ENCOUNTER — Ambulatory Visit (HOSPITAL_COMMUNITY): Payer: Medicaid Other | Admitting: Psychiatry

## 2014-08-17 ENCOUNTER — Encounter (INDEPENDENT_AMBULATORY_CARE_PROVIDER_SITE_OTHER): Payer: Self-pay

## 2014-08-17 ENCOUNTER — Ambulatory Visit (HOSPITAL_COMMUNITY): Payer: Medicaid Other | Admitting: Physician Assistant

## 2014-08-25 ENCOUNTER — Encounter (HOSPITAL_COMMUNITY): Payer: Self-pay

## 2014-10-13 ENCOUNTER — Ambulatory Visit (INDEPENDENT_AMBULATORY_CARE_PROVIDER_SITE_OTHER): Payer: Medicaid Other | Admitting: Orthopedic Surgery

## 2014-10-13 VITALS — Ht 65.0 in | Wt 261.0 lb

## 2014-10-13 DIAGNOSIS — M75101 Unspecified rotator cuff tear or rupture of right shoulder, not specified as traumatic: Secondary | ICD-10-CM

## 2014-10-13 NOTE — Progress Notes (Signed)
Progress note follow-up established patient  Chief Complaint  Patient presents with  . Follow-up    Recheck right shoulder.    Aspirate rotator cuff repair right shoulder over a year ago comes in complaining of pain over the right shoulder radiating up into the cervical spine request injection  She has full for elevation with pain after a 90 of flexion is some mild tenderness over the cervical spine. Diagnosis bursitis  Recommend injection   Procedure note the subacromial injection shoulder RIGHT  Verbal consent was obtained to inject the  RIGHT   Shoulder  Timeout was completed to confirm the injection site is a subacromial space of the  RIGHT  shoulder   Medication used Depo-Medrol 40 mg and lidocaine 1% 3 cc  Anesthesia was provided by ethyl chloride  The injection was performed in the RIGHT  posterior subacromial space. After pinning the skin with alcohol and anesthetized the skin with ethyl chloride the subacromial space was injected using a 20-gauge needle. There were no complications  Sterile dressing was applied.   Follow-up as needed

## 2014-11-09 ENCOUNTER — Encounter (INDEPENDENT_AMBULATORY_CARE_PROVIDER_SITE_OTHER): Payer: Self-pay | Admitting: Internal Medicine

## 2014-11-09 ENCOUNTER — Ambulatory Visit (INDEPENDENT_AMBULATORY_CARE_PROVIDER_SITE_OTHER): Payer: Medicaid Other | Admitting: Internal Medicine

## 2014-11-09 ENCOUNTER — Ambulatory Visit (HOSPITAL_COMMUNITY)
Admission: RE | Admit: 2014-11-09 | Discharge: 2014-11-09 | Disposition: A | Discharge status: Home / Self Care | Payer: Medicaid Other | Source: Ambulatory Visit | Attending: Internal Medicine | Admitting: Internal Medicine

## 2014-11-09 VITALS — BP 106/84 | HR 80 | Temp 98.8°F | Ht 65.5 in | Wt 261.9 lb

## 2014-11-09 DIAGNOSIS — K219 Gastro-esophageal reflux disease without esophagitis: Secondary | ICD-10-CM | POA: Diagnosis not present

## 2014-11-09 DIAGNOSIS — R05 Cough: Secondary | ICD-10-CM | POA: Insufficient documentation

## 2014-11-09 DIAGNOSIS — R0781 Pleurodynia: Secondary | ICD-10-CM | POA: Insufficient documentation

## 2014-11-09 MED ORDER — PANTOPRAZOLE SODIUM 40 MG PO TBEC: 40.0000 mg | DELAYED_RELEASE_TABLET | Freq: Two times a day (BID) | ORAL | Status: DC

## 2014-11-09 NOTE — Patient Instructions (Addendum)
Left rib xray. OV in 3 months.  GERD: Protonix BID.

## 2014-11-09 NOTE — Progress Notes (Signed)
Subjective:    Patient ID: [name], female    DOB: 17-Jul-1966, 49 y.o.   MRN: [id number]  HPI Referred to our office by Dr. Cindie Laroche for left  Upper quadrant pain. She says she coughed and she felt something popped. The cough was a hard cough. She had bronchitis.  If she lays flat, and not moving, there is no pain. If she  She says she belches frequently and has acid reflux. She was on Nexium but insurance would not pay.  She had an [place] which was normal except for gallstones.  She also tells me when she has a BM, sometimes she has to strain which is not new. She takes a stool softener daily. She has seen mucous in her stool. Usually has a stool daily but sometimes misses a day.  No change in her stools.  Appetite for the most part good. No weight loss.  10/12/2014 NA 139, K 4.6, Chloride 101, BUN 16, Creatinine 0.78, total bili 0.4, ALP 94, AST 19, ALT 22, Total protein 6.9, Albumin 4.3, Calcium 9.3, Lipase 13. 08/14/2014 Hemoglobin 14.   Review of Systems Past Medical History  Diagnosis Date  . IBS (irritable bowel syndrome)   . GERD (gastroesophageal reflux disease)     IBS  . Arthritis     disk disease  . Vocal cord cancer   . Fibromyalgia   . Depression   . Peripheral arterial disease   . Chronic pain     Past Surgical History  Procedure Laterality Date  . Carpal tunnel release Bilateral   . Nose surgery    . Tonsillectomy    . Cleft palate repair    . Laparoscopic tubal ligation      [facility]  . Throat surgery  2000    for throat cancer/no problems [place]ubation[place]  . Endometrial ablation    . Anterior lat lumbar fusion  03/19/2012    Procedure: ANTERIOR LATERAL LUMBAR FUSION 1 LEVEL;  Surgeon: Faythe Ghee, MD;  Location: [place] [facility];  Service: Neurosurgery;  Laterality: Right;  Anterolateral Lumbar Interbody Fusion, Lumbar Three-Four (Hand broke through glove and nat in the room)  . Anterior cervical decomp/discectomy fusion  08/18/2012   Procedure: ANTERIOR CERVICAL DECOMPRESSION/DISCECTOMY FUSION 2 LEVELS;  Surgeon: Faythe Ghee, MD;  Location: [facility];  Service: Neurosurgery;  Laterality: N/A;  anterior cervical five-six,six-seven decompression fusion plating  . Tubal ligation    . Shoulder open rotator cuff repair Right 04/02/2013    Procedure: ROTATOR CUFF REPAIR SHOULDER OPEN;  Surgeon: Carole Civil, MD;  Location: [facility];  Service: Orthopedics;  Laterality: Right;  . Bunionectomy Left 08/04/2014    Procedure: LEFT FOOT LAPIDUS BUNION CORRECTION, LEFT SECOND TARSALMETATARSAL JOINT DEBRIDEMENT AND LEFT MODIFIED MCBRIDE BUNIONECTOMY;  Surgeon: [name], MD;  Location: [facility];  Service: Orthopedics;  Laterality: Left;    Allergies  Allergen Reactions  . Bee Venom Anaphylaxis    Current Outpatient Prescriptions on File Prior to Visit  Medication Sig Dispense Refill  . amphetamine-dextroamphetamine (ADDERALL XR) 20 MG 24 hr capsule Take 10 mg by mouth 3 (three) times daily.    Marland Kitchen escitalopram (LEXAPRO) 10 MG tablet Take 10 mg by mouth daily.    . fesoterodine (TOVIAZ) 4 MG TB24 tablet Take 4 mg by mouth daily.    Marland Kitchen gabapentin (NEURONTIN) 300 MG capsule Take 300 mg by mouth 3 (three) times daily.    . varenicline (CHANTIX) 0.5 MG tablet Take 0.5  mg by mouth 2 (two) times daily.    Marland Kitchen EPINEPHrine (EPI-PEN) 0.3 mg/0.3 mL DEVI Inject 0.3 mg into the muscle once as needed. For bee venom     No current facility-administered medications on file prior to visit.        Objective:   Physical Exam Blood pressure 106/84, pulse 80, temperature 98.8 F (37.1 C), height 5' 5.5" (1.664 m), weight 261 lb 14.4 oz (118.797 kg).   Alert and oriented. Skin warm and dry. Oral mucosa is moist.   . Sclera anicteric, conjunctivae is pink. Thyroid not enlarged. No cervical lymphadenopathy. Lungs clear. Heart regular rate and rhythm.  Abdomen is soft. Bowel sounds are positive. No hepatomegaly. No abdominal masses  felt. No tenderness.  No edema to lower extremities.Tenderness left lateral ribs. Stool brown and guaiac negative.  Developer: [id number], Exp 9-17 Card: Lot 02-14, Exp 07.18     Assessment & Plan:  GERD: presently taking once a day. Needs to take BID. Left upper quadrant pain: will get a left ribs to be sure she does not have a fx.  Take Naproxen BID for the pain.  Will put on a recall for a colonoscopy for September.

## 2014-11-10 ENCOUNTER — Encounter (INDEPENDENT_AMBULATORY_CARE_PROVIDER_SITE_OTHER): Payer: Self-pay

## 2014-12-08 ENCOUNTER — Encounter (INDEPENDENT_AMBULATORY_CARE_PROVIDER_SITE_OTHER): Payer: Self-pay | Admitting: Internal Medicine

## 2014-12-08 ENCOUNTER — Ambulatory Visit (INDEPENDENT_AMBULATORY_CARE_PROVIDER_SITE_OTHER): Payer: Medicaid Other | Admitting: Internal Medicine

## 2014-12-08 VITALS — BP 108/84 | HR 56 | Temp 98.4°F | Ht 65.5 in | Wt 264.4 lb

## 2014-12-08 DIAGNOSIS — M546 Pain in thoracic spine: Secondary | ICD-10-CM

## 2014-12-08 NOTE — Progress Notes (Signed)
Subjective:    Patient ID: [name], female    DOB: 22-Dec-1965, 49 y.o.   MRN: [id number]  HPI Here today for f/u. She was last seen in April for Left upper quadrant pain. The pain started after she coughed and felt something pop. She had bronchitis during this time.  She underwent an US abdomen which was normal except for gallstones.  Rib xrays did not reveal any fractures. All labs were normal. She says her back is tender. She says she cannot lie on her left side because of the pressure. She tried on a rib belt but she says it was too much pressure. When she turns she has pain in her back. Appetite is good. No weight loss. No dysphagia She does have some acid reflux and takes Protonix BID. She says she still belches and has frequent belches.        10/12/2014 NA 139, K 4.6, Chloride 101, BUN 16, Creatinine 0.78, total bili 0.4, ALP 94, AST 19, ALT 22, Total protein 6.9, Albumin 4.3, Calcium 9.3, Lipase 13. 08/14/2014 Hemoglobin 14.   Review of Systems Past Medical History  Diagnosis Date  . IBS (irritable bowel syndrome)   . GERD (gastroesophageal reflux disease)     IBS  . Arthritis     disk disease  . Vocal cord cancer   . Fibromyalgia   . Depression   . Peripheral arterial disease   . Chronic pain     Past Surgical History  Procedure Laterality Date  . Carpal tunnel release Bilateral   . Nose surgery    . Tonsillectomy    . Cleft palate repair    . Laparoscopic tubal ligation      [facility]  . Throat surgery  2000    for throat cancer/[place] problems [place]  . Endometrial ablation    . Anterior lat lumbar fusion  03/19/2012    Procedure: ANTERIOR LATERAL LUMBAR FUSION 1 LEVEL;  Surgeon: Faythe Ghee, MD;  Location: [place] [facility];  Service: Neurosurgery;  Laterality: Right;  Anterolateral Lumbar Interbody Fusion, Lumbar Three-Four (Hand broke through glove and nat in the room)  . Anterior cervical decomp/discectomy fusion   08/18/2012    Procedure: ANTERIOR CERVICAL DECOMPRESSION/DISCECTOMY FUSION 2 LEVELS;  Surgeon: Faythe Ghee, MD;  Location: [facility];  Service: Neurosurgery;  Laterality: N/A;  anterior cervical five-six,six-seven decompression fusion plating  . Tubal ligation    . Shoulder open rotator cuff repair Right 04/02/2013    Procedure: ROTATOR CUFF REPAIR SHOULDER OPEN;  Surgeon: Carole Civil, MD;  Location: [facility];  Service: Orthopedics;  Laterality: Right;  . Bunionectomy Left 08/04/2014    Procedure: LEFT FOOT LAPIDUS BUNION CORRECTION, LEFT SECOND TARSALMETATARSAL JOINT DEBRIDEMENT AND LEFT MODIFIED MCBRIDE BUNIONECTOMY;  Surgeon: [name], MD;  Location: [place];  Service: Orthopedics;  Laterality: Left;    Allergies  Allergen Reactions  . Bee Venom Anaphylaxis    Current Outpatient Prescriptions on File Prior to Visit  Medication Sig Dispense Refill  . amphetamine-dextroamphetamine (ADDERALL XR) 20 MG 24 hr capsule Take 10 mg by mouth 3 (three) times daily.    Marland Kitchen buPROPion (WELLBUTRIN XL) 300 MG 24 hr tablet Take 300 mg by mouth daily.    . fesoterodine (TOVIAZ) 4 MG TB24 tablet Take 4 mg by mouth daily.    Marland Kitchen gabapentin (NEURONTIN) 300 MG capsule Take 300 mg by mouth 3 (three) times daily.    . naproxen (NAPROSYN) 500 MG tablet  Take 500 mg by mouth 3 (three) times daily with meals.     . pantoprazole (PROTONIX) 40 MG tablet Take 1 tablet (40 mg total) by mouth 2 (two) times daily. 60 tablet 3   No current facility-administered medications on file prior to visit.        Objective:   Physical Exam Blood pressure 108/84, pulse 56, temperature 98.4 F (36.9 C), height 5' 5.5" (1.664 m), weight 264 lb 6.4 oz (119.931 kg).  Alert and oriented. Skin warm and dry. Oral mucosa is moist.   . Sclera anicteric, conjunctivae is pink. Thyroid not enlarged. No cervical lymphadenopathy. Lungs clear. Heart regular rate and rhythm.  Abdomen is soft. Bowel sounds are  positive. No hepatomegaly. No abdominal masses felt. Tenderness left flank and left back. .  No edema to lower extremities.        Assessment & Plan:  Musculoskeletal pain.  This is not GI related pain.  . Will refer back to her primary care.

## 2014-12-08 NOTE — Patient Instructions (Signed)
OV  As needed.

## 2014-12-27 ENCOUNTER — Encounter: Payer: Self-pay | Admitting: Orthopedic Surgery

## 2014-12-27 ENCOUNTER — Ambulatory Visit (INDEPENDENT_AMBULATORY_CARE_PROVIDER_SITE_OTHER): Payer: Medicaid Other | Admitting: Orthopedic Surgery

## 2014-12-27 VITALS — BP 162/97 | Ht 65.5 in | Wt 264.4 lb

## 2014-12-27 DIAGNOSIS — M170 Bilateral primary osteoarthritis of knee: Secondary | ICD-10-CM | POA: Diagnosis not present

## 2014-12-27 MED ORDER — DICLOFENAC POTASSIUM 50 MG PO TABS: 50.0000 mg | ORAL_TABLET | Freq: Two times a day (BID) | ORAL | Status: DC

## 2014-12-27 NOTE — Progress Notes (Signed)
Progress note patient requests bilateral knee injections  This patient has a history of spinal stenosis she had fusion by Dr. Hal Neer. She presents with bilateral hip pain bilateral knee pain and she sneezes not too long ago and felt a pop in her back and since that time her back pain and hip pain has worsened. She had an x-ray here of her left knee which showed some very mild degenerative changes of was not very impressed. She comes in requesting bilateral knee injections as she did get some improvement in the past from injection  We therefore reinjected both knees. However, she is having incontinence when she sneezes and is worse and she felt a pop in her back. I have asked that she see Dr. Hal Neer and she will need referral from Dr. Lorriane Shire because of Medicaid. We do know that she does have urinary incontinence but with her history of spinal stenosis I would recommend that she see Dr. Karleen Hampshire in the setting of new onset pop in the back and increased pain in her legs without radiographic or clinical evidence of any significant knee problems    Procedure note left knee injection verbal consent was obtained to inject left knee joint  Timeout was completed to confirm the site of injection  The medications used were 40 mg of Depo-Medrol and 1% lidocaine 3 cc  Anesthesia was provided by ethyl chloride and the skin was prepped with alcohol.  After cleaning the skin with alcohol a 20-gauge needle was used to inject the left knee joint. There were no complications. A sterile bandage was applied.   Procedure note right knee injection verbal consent was obtained to inject right knee joint  Timeout was completed to confirm the site of injection  The medications used were 40 mg of Depo-Medrol and 1% lidocaine 3 cc  Anesthesia was provided by ethyl chloride and the skin was prepped with alcohol.  After cleaning the skin with alcohol a 20-gauge needle was used to inject the right knee joint. There  were no complications. A sterile bandage was applied.

## 2014-12-27 NOTE — Patient Instructions (Signed)
New med sent to pharmacy to replace naproxen

## 2015-01-10 ENCOUNTER — Other Ambulatory Visit (HOSPITAL_COMMUNITY): Payer: Self-pay | Admitting: Family Medicine

## 2015-01-10 DIAGNOSIS — M545 Low back pain, unspecified: Secondary | ICD-10-CM

## 2015-01-13 ENCOUNTER — Ambulatory Visit (HOSPITAL_COMMUNITY)
Admission: RE | Admit: 2015-01-13 | Discharge: 2015-01-13 | Disposition: A | Discharge status: Home / Self Care | Payer: Medicaid Other | Source: Ambulatory Visit | Attending: Family Medicine | Admitting: Family Medicine

## 2015-01-13 ENCOUNTER — Other Ambulatory Visit (HOSPITAL_COMMUNITY): Payer: Self-pay | Admitting: Family Medicine

## 2015-01-13 DIAGNOSIS — M545 Low back pain, unspecified: Secondary | ICD-10-CM

## 2015-01-13 DIAGNOSIS — Z1231 Encounter for screening mammogram for malignant neoplasm of breast: Secondary | ICD-10-CM

## 2015-01-19 ENCOUNTER — Ambulatory Visit (HOSPITAL_COMMUNITY): Payer: Self-pay

## 2015-01-26 ENCOUNTER — Ambulatory Visit (HOSPITAL_COMMUNITY)
Admission: RE | Admit: 2015-01-26 | Discharge: 2015-01-26 | Disposition: A | Discharge status: Home / Self Care | Payer: Medicaid Other | Source: Ambulatory Visit | Attending: Family Medicine | Admitting: Family Medicine

## 2015-01-26 DIAGNOSIS — Z1231 Encounter for screening mammogram for malignant neoplasm of breast: Secondary | ICD-10-CM

## 2015-02-02 ENCOUNTER — Encounter (INDEPENDENT_AMBULATORY_CARE_PROVIDER_SITE_OTHER): Payer: Self-pay | Admitting: Internal Medicine

## 2015-02-02 ENCOUNTER — Ambulatory Visit (INDEPENDENT_AMBULATORY_CARE_PROVIDER_SITE_OTHER): Payer: Medicaid Other | Admitting: Internal Medicine

## 2015-02-02 ENCOUNTER — Telehealth (INDEPENDENT_AMBULATORY_CARE_PROVIDER_SITE_OTHER): Payer: Self-pay | Admitting: *Deleted

## 2015-02-02 ENCOUNTER — Other Ambulatory Visit (INDEPENDENT_AMBULATORY_CARE_PROVIDER_SITE_OTHER): Payer: Self-pay | Admitting: *Deleted

## 2015-02-02 VITALS — BP 122/84 | HR 76 | Temp 97.7°F | Ht 65.6 in | Wt 266.7 lb

## 2015-02-02 DIAGNOSIS — Z1211 Encounter for screening for malignant neoplasm of colon: Secondary | ICD-10-CM | POA: Diagnosis not present

## 2015-02-02 DIAGNOSIS — K219 Gastro-esophageal reflux disease without esophagitis: Secondary | ICD-10-CM

## 2015-02-02 NOTE — Patient Instructions (Addendum)
  EGD/Colonoscopy The risks and benefits such as perforation, bleeding, and infection were reviewed with the patient and is agreeable. Continue Protonix BID

## 2015-02-02 NOTE — Telephone Encounter (Signed)
Patient needs trilyte 

## 2015-02-02 NOTE — Progress Notes (Signed)
Subjective:    Patient ID: [name], female    DOB: 1966-06-03, 49 y.o.   MRN: [id number]  HPIHere today for f/u Today, she has a new complaint. . She today c/o epigastric pain and bloating. She says she has had these symptoms for about a month. It feels like a squeezing sensation across her upper abdomen.  She says when she eats she will have acid reflux. She takes Protonix BID for her acid reflux which helps for the most part. The bloating she says is the worse. After she eats she will have a BM which is soft. No melena or BRRB. She tells me her Father's mother, father brother, and grand father died of stomach cancer. Her appetite is good. No weight loss. She usually has a BM x 5 a day, and somedays she doesn't have one. Patient is requesting EGD/Colonoscopy   11/09/2014 Ribs: negative  10/12/2014 NA 139, K 4.6, Chloride 101, BUN 16, Creatinine 0.78, total bili 0.4, ALP 94, AST 19, ALT 22, Total protein 6.9, Albumin 4.3, Calcium 9.3, Lipase 13. 08/14/2014 Hemoglobin 14.         OPERATIVE REPORT  06/21/2002 PROCEDURE: Esophagogastroduodenoscopy followed by total colonoscopy.  INDICATIONS FOR PROCEDURE: An active 49 year old Caucasian female who presents with acute onset of LLQ abdominal pain, rectal bleeding and melena. She also gives a history of heartburn of 4-5 years duration and presently on PPI. Therefore an EGG was also recommended. Both the procedure and risks were reviewed with the patient and informed consent was obtained.  FINAL DIAGNOSES: 1. Erosive reflux esophagitis with serrated GE junction and a small sliding  hiatal hernia. 2. Nonerosive gastritis. 3. Patchy colitis involving the rectum and distal sigmoid colon which would  explain her acute symptomatology. This could be an infection or  nonspecific.   Review of Systems Past Medical History  Diagnosis Date  . IBS (irritable bowel syndrome)   . GERD  (gastroesophageal reflux disease)     IBS  . Arthritis     disk disease  . Vocal cord cancer   . Fibromyalgia   . Depression   . Peripheral arterial disease   . Chronic pain     Past Surgical History  Procedure Laterality Date  . Carpal tunnel release Bilateral   . Nose surgery    . Tonsillectomy    . Cleft palate repair    . Laparoscopic tubal ligation      [facility]  . Throat surgery  2000    for throat cancer/no problems [place]  . Endometrial ablation    . Anterior lat lumbar fusion  03/19/2012    Procedure: ANTERIOR LATERAL LUMBAR FUSION 1 LEVEL;  Surgeon: Faythe Ghee, MD;  Location: [place] [facility];  Service: Neurosurgery;  Laterality: Right;  Anterolateral Lumbar Interbody Fusion, Lumbar Three-Four (Hand broke through glove and nat in the room)  . Anterior cervical decomp/discectomy fusion  08/18/2012    Procedure: ANTERIOR CERVICAL DECOMPRESSION/DISCECTOMY FUSION 2 LEVELS;  Surgeon: Faythe Ghee, MD;  Location: [facility];  Service: Neurosurgery;  Laterality: N/A;  anterior cervical five-six,six-seven decompression fusion plating  . Tubal ligation    . Shoulder open rotator cuff repair Right 04/02/2013    Procedure: ROTATOR CUFF REPAIR SHOULDER OPEN;  Surgeon: Carole Civil, MD;  Location: [facility];  Service: Orthopedics;  Laterality: Right;  . Bunionectomy Left 08/04/2014    Procedure: LEFT FOOT LAPIDUS BUNION CORRECTION, LEFT SECOND TARSALMETATARSAL JOINT DEBRIDEMENT AND LEFT MODIFIED MCBRIDE BUNIONECTOMY;  Surgeon:  [name], MD;  Location: [place];  Service: Orthopedics;  Laterality: Left;    Allergies  Allergen Reactions  . Bee Venom Anaphylaxis    Current Outpatient Prescriptions on File Prior to Visit  Medication Sig Dispense Refill  . amphetamine-dextroamphetamine (ADDERALL XR) 20 MG 24 hr capsule Take 10 mg by mouth 3 (three) times daily.    . diclofenac (CATAFLAM) 50 MG tablet Take 1 tablet (50 mg total) by  mouth 2 (two) times daily. 60 tablet 3  . EPINEPHrine 0.3 mg/0.3 mL IJ SOAJ injection Inject into the muscle once.    . fesoterodine (TOVIAZ) 4 MG TB24 tablet Take 4 mg by mouth daily.    Marland Kitchen gabapentin (NEURONTIN) 300 MG capsule Take 300 mg by mouth 3 (three) times daily.    . pantoprazole (PROTONIX) 40 MG tablet Take 1 tablet (40 mg total) by mouth 2 (two) times daily. 60 tablet 3  . buPROPion (WELLBUTRIN XL) 300 MG 24 hr tablet Take 300 mg by mouth daily.     No current facility-administered medications on file prior to visit.        Objective:   Physical Exam Blood pressure 122/84, pulse 76, temperature 97.7 F (36.5 C), height 5' 5.6" (1.666 m), weight 266 lb 11.2 oz (120.974 kg).  Alert and oriented. Skin warm and dry. Oral mucosa is moist.   . Sclera anicteric, conjunctivae is pink. Thyroid not enlarged. No cervical lymphadenopathy. Lungs clear. Heart regular rate and rhythm.  Abdomen is soft. Bowel sounds are positive. No hepatomegaly. No abdominal masses felt. Slight epigastric tenderness.  No edema to lower extremities.         Assessment & Plan:  GERD,Bloating. PUD needs to be ruled out. Patient requesting a colonoscopy. She will be due for one in September. EGD/Colonoscopy. The risks and benefits such as perforation, bleeding, and infection were reviewed with the patient and is agreeable. Continue Protonix BID

## 2015-02-03 MED ORDER — PEG 3350-KCL-NA BICARB-NACL 420 G PO SOLR: 4000.0000 mL | Freq: Once | ORAL | Status: DC

## 2015-02-08 ENCOUNTER — Ambulatory Visit (INDEPENDENT_AMBULATORY_CARE_PROVIDER_SITE_OTHER): Payer: Self-pay | Admitting: Internal Medicine

## 2015-02-16 ENCOUNTER — Encounter (HOSPITAL_COMMUNITY): Payer: Self-pay | Admitting: *Deleted

## 2015-02-16 ENCOUNTER — Encounter (HOSPITAL_COMMUNITY)
Admission: RE | Disposition: A | Discharge status: Home / Self Care | Payer: Self-pay | Source: Ambulatory Visit | Attending: Internal Medicine

## 2015-02-16 ENCOUNTER — Ambulatory Visit (HOSPITAL_COMMUNITY)
Admission: RE | Admit: 2015-02-16 | Discharge: 2015-02-16 | Disposition: A | Discharge status: Home / Self Care | Payer: Medicaid Other | Source: Ambulatory Visit | Attending: Internal Medicine | Admitting: Internal Medicine

## 2015-02-16 DIAGNOSIS — Z800 Family history of malignant neoplasm of digestive organs: Secondary | ICD-10-CM | POA: Diagnosis not present

## 2015-02-16 DIAGNOSIS — Z7982 Long term (current) use of aspirin: Secondary | ICD-10-CM | POA: Insufficient documentation

## 2015-02-16 DIAGNOSIS — F329 Major depressive disorder, single episode, unspecified: Secondary | ICD-10-CM | POA: Diagnosis not present

## 2015-02-16 DIAGNOSIS — Z1211 Encounter for screening for malignant neoplasm of colon: Secondary | ICD-10-CM | POA: Diagnosis not present

## 2015-02-16 DIAGNOSIS — K228 Other specified diseases of esophagus: Secondary | ICD-10-CM | POA: Diagnosis not present

## 2015-02-16 DIAGNOSIS — Z8521 Personal history of malignant neoplasm of larynx: Secondary | ICD-10-CM | POA: Insufficient documentation

## 2015-02-16 DIAGNOSIS — Z79899 Other long term (current) drug therapy: Secondary | ICD-10-CM | POA: Insufficient documentation

## 2015-02-16 DIAGNOSIS — Z91030 Bee allergy status: Secondary | ICD-10-CM | POA: Insufficient documentation

## 2015-02-16 DIAGNOSIS — K589 Irritable bowel syndrome without diarrhea: Secondary | ICD-10-CM | POA: Insufficient documentation

## 2015-02-16 DIAGNOSIS — K449 Diaphragmatic hernia without obstruction or gangrene: Secondary | ICD-10-CM

## 2015-02-16 DIAGNOSIS — K644 Residual hemorrhoidal skin tags: Secondary | ICD-10-CM | POA: Insufficient documentation

## 2015-02-16 DIAGNOSIS — F17210 Nicotine dependence, cigarettes, uncomplicated: Secondary | ICD-10-CM | POA: Diagnosis not present

## 2015-02-16 DIAGNOSIS — M797 Fibromyalgia: Secondary | ICD-10-CM | POA: Diagnosis not present

## 2015-02-16 DIAGNOSIS — G8929 Other chronic pain: Secondary | ICD-10-CM | POA: Insufficient documentation

## 2015-02-16 DIAGNOSIS — K648 Other hemorrhoids: Secondary | ICD-10-CM | POA: Diagnosis not present

## 2015-02-16 DIAGNOSIS — I739 Peripheral vascular disease, unspecified: Secondary | ICD-10-CM | POA: Diagnosis not present

## 2015-02-16 DIAGNOSIS — K219 Gastro-esophageal reflux disease without esophagitis: Secondary | ICD-10-CM | POA: Diagnosis not present

## 2015-02-16 DIAGNOSIS — K2270 Barrett's esophagus without dysplasia: Secondary | ICD-10-CM | POA: Diagnosis not present

## 2015-02-16 HISTORY — PX: COLONOSCOPY: SHX5424

## 2015-02-16 HISTORY — DX: Headache: R51

## 2015-02-16 HISTORY — DX: Headache, unspecified: R51.9

## 2015-02-16 HISTORY — PX: ESOPHAGOGASTRODUODENOSCOPY: SHX5428

## 2015-02-16 SURGERY — COLONOSCOPY
Anesthesia: Moderate Sedation

## 2015-02-16 MED ORDER — MIDAZOLAM HCL 5 MG/5ML IJ SOLN
INTRAMUSCULAR | Status: DC | PRN
  Administered 2015-02-16 (×2): 2 mg via INTRAVENOUS
  Administered 2015-02-16: 1 mg via INTRAVENOUS
  Administered 2015-02-16: 2 mg via INTRAVENOUS
  Administered 2015-02-16: 3 mg via INTRAVENOUS
  Administered 2015-02-16: 2 mg via INTRAVENOUS

## 2015-02-16 MED ORDER — BUTAMBEN-TETRACAINE-BENZOCAINE 2-2-14 % EX AERO
INHALATION_SPRAY | CUTANEOUS | Status: DC | PRN
Start: 2015-02-16 — End: 2015-02-16
  Administered 2015-02-16: 1 via TOPICAL

## 2015-02-16 MED ORDER — MEPERIDINE HCL 50 MG/ML IJ SOLN
INTRAMUSCULAR | Status: AC
  Filled 2015-02-16: qty 1

## 2015-02-16 MED ORDER — PROMETHAZINE HCL 25 MG/ML IJ SOLN
25.0000 mg | Freq: Once | INTRAMUSCULAR | Status: AC
  Administered 2015-02-16: 25 mg via INTRAVENOUS

## 2015-02-16 MED ORDER — MIDAZOLAM HCL 5 MG/5ML IJ SOLN
INTRAMUSCULAR | Status: AC
  Filled 2015-02-16: qty 5

## 2015-02-16 MED ORDER — MIDAZOLAM HCL 5 MG/5ML IJ SOLN
INTRAMUSCULAR | Status: AC
  Filled 2015-02-16: qty 10

## 2015-02-16 MED ORDER — PROMETHAZINE HCL 25 MG/ML IJ SOLN
INTRAMUSCULAR | Status: AC
  Filled 2015-02-16: qty 1

## 2015-02-16 MED ORDER — MEPERIDINE HCL 50 MG/ML IJ SOLN
INTRAMUSCULAR | Status: DC | PRN
  Administered 2015-02-16 (×2): 25 mg via INTRAVENOUS

## 2015-02-16 MED ORDER — SODIUM CHLORIDE 0.9 % IJ SOLN
INTRAMUSCULAR | Status: AC
  Filled 2015-02-16: qty 3

## 2015-02-16 MED ORDER — STERILE WATER FOR IRRIGATION IR SOLN
Status: DC | PRN
  Administered 2015-02-16: 14:00:00

## 2015-02-16 MED ORDER — SODIUM CHLORIDE 0.9 % IV SOLN
INTRAVENOUS | Status: DC
  Administered 2015-02-16: 14:00:00 via INTRAVENOUS

## 2015-02-16 NOTE — Op Note (Addendum)
EGD AND COLONOSCOPY  PROCEDURE REPORT  PATIENT:  [name]  MR#:  [id number] Birthdate:  10/27/1965, 49 y.o., female Endoscopist:  Dr. Rogene Houston, MD Referred By:  Dr. Maricela Curet, MD  Procedure Date: 02/16/2015  Procedure:   EGD & Colonoscopy  Indications:  Patient is 49 year old Caucasian female was chronic GERD and presently on double dose PPI and still having breakthrough symptoms. She is undergoing diagnostic EGD followed by screening colonoscopy. Family history is significant for colon carcinoma in paternal uncle who was in his 90s the time of diagnosis.            Informed Consent:  The risks, benefits, alternatives & imponderables which include, but are not limited to, bleeding, infection, perforation, drug reaction and potential missed lesion have been reviewed.  The potential for biopsy, lesion removal, esophageal dilation, etc. have also been discussed.  Questions have been answered.  All parties agreeable.  Please see history & physical in medical record for more information.  Medications:  Demerol 50 mg IV Versed 12 mg IV Promethazine 25 mg IV and diluted form. Cetacaine spray topically for oropharyngeal anesthesia  EGD  Description of procedure:  The endoscope was introduced through the mouth and advanced to the second portion of the duodenum without difficulty or limitations. The mucosal surfaces were surveyed very carefully during advancement of the scope and upon withdrawal.  Findings:  Esophagus:  Mucosa of the esophagus was normal. Short tongue of salmon colored mucosa at distal esophagus contiguous to GE junction. It was no more than 7 x 3 mm. No ring or stricture noted. GEJ:  35 cm Hiatus:  38 cm Stomach:  Stomach was empty and distended very well with insufflation. Folds in the proximal stomach were normal. Examination of mucosa at gastric body, antrum, pyloric channel, angulus fundus and cardia was normal. Duodenum:  Normal bulbar and post bulbar  mucosa.  Therapeutic/Diagnostic Maneuvers Performed:  Biopsy taken from patches of salmon colored mucosa at distal esophagus for routine histology.  COLONOSCOPY Description of procedure:  After a digital rectal exam was performed, that colonoscope was advanced from the anus through the rectum and colon to the area of the cecum, ileocecal valve and appendiceal orifice. The cecum was deeply intubated. These structures were well-seen and photographed for the record. From the level of the cecum and ileocecal valve, the scope was slowly and cautiously withdrawn. The mucosal surfaces were carefully surveyed utilizing scope tip to flexion to facilitate fold flattening as needed. The scope was pulled down into the rectum where a thorough exam including retroflexion was performed.  Findings:   Prep fair. Redundant colon with normal mucosa throughout. Normal rectal mucosa. Small hemorrhoids below the dentate line.  Therapeutic/Diagnostic Maneuvers Performed:  None  Complications:  None  Cecal Withdrawal Time:  8 minutes  Impression:  EGD findings: Small sliding hiatal hernia with short tongue of salmon colored mucosa at distal esophagus. Was biopsied for routine histology. No evidence of erosive esophagitis, gastritis or peptic ulcer disease.  Colonoscopy findings: Examination performed to cecum. No evidence of colonic polyps or other abnormalities but examination somewhat suboptimal because of quality of prep. Small external hemorrhoids.  Recommendations:  Standard instructions given. Antireflux measures reinforced. Patient must quit cigarette smoking. I will be contacting patient with biopsy results. Next screening colonoscopy in 10 years unless family history changes.  REHMAN,NAJEEB U  02/16/2015 3:39 PM  CC: Dr. Maricela Curet, MD & Dr. Rayne Du ref. provider found

## 2015-02-16 NOTE — Discharge Instructions (Signed)
Resume usual medications and diet. No driving for 24 hours. Physician will call with biopsy results.  Gastrointestinal Endoscopy, Care After Refer to this sheet in the next few weeks. These instructions provide you with information on caring for yourself after your procedure. Your caregiver may also give you more specific instructions. Your treatment has been planned according to current medical practices, but problems sometimes occur. Call your caregiver if you have any problems or questions after your procedure. HOME CARE INSTRUCTIONS  If you were given medicine to help you relax (sedative), do not drive, operate machinery, or sign important documents for 24 hours.  Avoid alcohol and hot or warm beverages for the first 24 hours after the procedure.  Only take over-the-counter or prescription medicines for pain, discomfort, or fever as directed by your caregiver. You may resume taking your normal medicines unless your caregiver tells you otherwise. Ask your caregiver when you may resume taking medicines that may cause bleeding, such as aspirin, clopidogrel, or warfarin.  You may return to your normal diet and activities on the day after your procedure, or as directed by your caregiver. Walking may help to reduce any bloated feeling in your abdomen.  Drink enough fluids to keep your urine clear or pale yellow.  You may gargle with salt water if you have a sore throat. SEEK IMMEDIATE MEDICAL CARE IF:  You have severe nausea or vomiting.  You have severe abdominal pain, abdominal cramps that last longer than 6 hours, or abdominal swelling (distention).  You have severe shoulder or back pain.  You have trouble swallowing.  You have shortness of breath, your breathing is shallow, or you are breathing faster than normal.  You have a fever or a rapid heartbeat.  You vomit blood or material that looks like coffee grounds.  You have bloody, black, or tarry stools. MAKE SURE  YOU:  Understand these instructions.  Will watch your condition.  Will get help right away if you are not doing well or get worse. Document Released: 02/27/2004 Document Revised: 11/29/2013 Document Reviewed: 10/15/2011 [facility] Patient Information 2015 [place], [place]. This information is not intended to replace advice given to you by your health care provider. Make sure you discuss any questions you have with your health care provider.

## 2015-02-16 NOTE — H&P (Signed)
[name] is an 49 y.o. female.   Chief Complaint: She is here for EGD and colonoscopy. HPI: Patient is 49 year old Caucasian female was separately history of gastroesophageal reflux disease who symptoms are not well controlled with double dose PPI. She has frequent heartburn regurgitation. She also has bloating. She is trying to watch her diet but she continues to smoke cigarettes. She denies dysphagia. Patient is also undergoing screening colonoscopy. He says one of her paternal uncles was diagnosed with colon carcinoma in his 58s and died at 67. Family history is positive for other malignancies as well.  Past Medical History  Diagnosis Date  . IBS (irritable bowel syndrome)   . GERD (gastroesophageal reflux disease)     IBS  . Arthritis     disk disease  . Vocal cord cancer   . Fibromyalgia   . Depression   . Peripheral arterial disease   . Chronic pain   . Headache     Past Surgical History  Procedure Laterality Date  . Carpal tunnel release Bilateral   . Nose surgery    . Tonsillectomy    . Cleft palate repair    . Laparoscopic tubal ligation      [facility]  . Throat surgery  2000    for throat cancer/no problems [facility]  . Endometrial ablation    . Anterior lat lumbar fusion  03/19/2012    Procedure: ANTERIOR LATERAL LUMBAR FUSION 1 LEVEL;  Surgeon: Faythe Ghee, MD;  Location: [place] [facility];  Service: Neurosurgery;  Laterality: Right;  Anterolateral Lumbar Interbody Fusion, Lumbar Three-Four (Hand broke through glove and nat in the room)  . Anterior cervical decomp/discectomy fusion  08/18/2012    Procedure: ANTERIOR CERVICAL DECOMPRESSION/DISCECTOMY FUSION 2 LEVELS;  Surgeon: Faythe Ghee, MD;  Location: [facility];  Service: Neurosurgery;  Laterality: N/A;  anterior cervical five-six,six-seven decompression fusion plating  . Tubal ligation    . Shoulder open rotator cuff repair Right 04/02/2013    Procedure: ROTATOR CUFF REPAIR  SHOULDER OPEN;  Surgeon: Carole Civil, MD;  Location: [facility];  Service: Orthopedics;  Laterality: Right;  . Bunionectomy Left 08/04/2014    Procedure: LEFT FOOT LAPIDUS BUNION CORRECTION, LEFT SECOND TARSALMETATARSAL JOINT DEBRIDEMENT AND LEFT MODIFIED MCBRIDE BUNIONECTOMY;  Surgeon: [name], MD;  Location: [place];  Service: Orthopedics;  Laterality: Left;    Family History  Problem Relation Age of Onset  . Diabetes Mother   . Hypertension Mother   . Heart disease Maternal Grandfather     sudden death  . Cancer Paternal Grandmother   . Cancer Paternal Grandfather    Social History:  reports that she has been smoking Cigarettes.  She has a 26 pack-year smoking history. She has never used smokeless tobacco. She reports that she drinks about 1.2 oz of alcohol per week. She reports that she does not use illicit drugs.  Allergies:  Allergies  Allergen Reactions  . Bee Venom Anaphylaxis    Medications Prior to Admission  Medication Sig Dispense Refill  . amphetamine-dextroamphetamine (ADDERALL XR) 20 MG 24 hr capsule Take 10 mg by mouth 3 (three) times daily.    . Aspirin-Salicylamide-Caffeine (BC HEADACHE) 325-95-16 MG TABS Take 1 Package by mouth daily as needed (pain).    . clonazePAM (KLONOPIN) 0.5 MG tablet Take 0.5 mg by mouth 2 (two) times daily as needed for anxiety.    . diclofenac (CATAFLAM) 50 MG tablet Take 1 tablet (50 mg total) by mouth 2 (  two) times daily. 60 tablet 3  . escitalopram (LEXAPRO) 20 MG tablet Take 20 mg by mouth daily.    . fesoterodine (TOVIAZ) 4 MG TB24 tablet Take 4 mg by mouth daily.    Marland Kitchen gabapentin (NEURONTIN) 300 MG capsule Take 300 mg by mouth 3 (three) times daily.    Marland Kitchen lisdexamfetamine (VYVANSE) 50 MG capsule Take 50 mg by mouth daily.    . pantoprazole (PROTONIX) 40 MG tablet Take 1 tablet (40 mg total) by mouth 2 (two) times daily. 60 tablet 3  . polyethylene glycol-electrolytes (NULYTELY/GOLYTELY) 420 G solution Take 4,000  mLs by mouth once. 4000 mL 0  . EPINEPHrine 0.3 mg/0.3 mL IJ SOAJ injection Inject into the muscle once.      No results found for this or any previous visit (from the past 48 hour(s)). No results found.  ROS  Blood pressure 125/78, temperature 98.2 F (36.8 C), temperature source Oral, resp. rate 18, height 5' 5.5" (1.664 m), weight 270 lb (122.471 kg), SpO2 100 %. Physical Exam  Constitutional: She appears well-developed and well-nourished.  HENT:  Mouth/Throat: Oropharynx is clear and moist.  Eyes: Conjunctivae are normal. No scleral icterus.  Neck: No thyromegaly present.  Cardiovascular: Normal rate, regular rhythm and normal heart sounds.   No murmur heard. Respiratory: Effort normal and breath sounds normal.  GI:  Protuberant abdomen. It is soft with mild tenderness across upper abdomen. No organomegaly or masses.  Musculoskeletal: She exhibits no edema.  Lymphadenopathy:    She has no cervical adenopathy.  Neurological: She is alert.  Skin: Skin is warm and dry.     Assessment/Plan Chronic GERD with poorly controlled symptoms. Diagnostic EGD and screening colonoscopy(? Average risk).  Proctor Carriker U 02/16/2015, 2:05 PM

## 2015-02-20 ENCOUNTER — Encounter (HOSPITAL_COMMUNITY): Payer: Self-pay | Admitting: Internal Medicine

## 2015-02-28 DIAGNOSIS — K2270 Barrett's esophagus without dysplasia: Secondary | ICD-10-CM

## 2015-03-01 ENCOUNTER — Encounter (INDEPENDENT_AMBULATORY_CARE_PROVIDER_SITE_OTHER): Payer: Self-pay | Admitting: *Deleted

## 2015-03-27 ENCOUNTER — Other Ambulatory Visit: Payer: Self-pay | Admitting: Student

## 2015-03-27 DIAGNOSIS — M79672 Pain in left foot: Secondary | ICD-10-CM

## 2015-04-07 ENCOUNTER — Ambulatory Visit
Admission: RE | Admit: 2015-04-07 | Discharge: 2015-04-07 | Disposition: A | Discharge status: Home / Self Care | Payer: Medicaid Other | Source: Ambulatory Visit | Attending: Student | Admitting: Student

## 2015-04-07 DIAGNOSIS — M79672 Pain in left foot: Secondary | ICD-10-CM

## 2015-04-11 ENCOUNTER — Ambulatory Visit (INDEPENDENT_AMBULATORY_CARE_PROVIDER_SITE_OTHER): Payer: Medicaid Other | Admitting: Adult Health

## 2015-04-11 ENCOUNTER — Other Ambulatory Visit (HOSPITAL_COMMUNITY)
Admission: RE | Admit: 2015-04-11 | Discharge: 2015-04-11 | Disposition: A | Discharge status: Home / Self Care | Payer: Medicaid Other | Source: Ambulatory Visit | Attending: Adult Health | Admitting: Adult Health

## 2015-04-11 ENCOUNTER — Encounter: Payer: Self-pay | Admitting: Adult Health

## 2015-04-11 VITALS — BP 120/84 | HR 88 | Ht 64.25 in | Wt 275.0 lb

## 2015-04-11 DIAGNOSIS — Z01419 Encounter for gynecological examination (general) (routine) without abnormal findings: Secondary | ICD-10-CM

## 2015-04-11 DIAGNOSIS — Z1212 Encounter for screening for malignant neoplasm of rectum: Secondary | ICD-10-CM | POA: Diagnosis not present

## 2015-04-11 DIAGNOSIS — Z1151 Encounter for screening for human papillomavirus (HPV): Secondary | ICD-10-CM | POA: Insufficient documentation

## 2015-04-11 DIAGNOSIS — R232 Flushing: Secondary | ICD-10-CM | POA: Insufficient documentation

## 2015-04-11 DIAGNOSIS — R1031 Right lower quadrant pain: Secondary | ICD-10-CM

## 2015-04-11 DIAGNOSIS — Z113 Encounter for screening for infections with a predominantly sexual mode of transmission: Secondary | ICD-10-CM | POA: Insufficient documentation

## 2015-04-11 DIAGNOSIS — Z0000 Encounter for general adult medical examination without abnormal findings: Secondary | ICD-10-CM

## 2015-04-11 HISTORY — DX: Flushing: R23.2

## 2015-04-11 HISTORY — DX: Right lower quadrant pain: R10.31

## 2015-04-11 LAB — HEMOCCULT GUIAC POC 1CARD (OFFICE): Fecal Occult Blood, POC: NEGATIVE

## 2015-04-11 NOTE — Progress Notes (Signed)
Patient ID: [name], female   DOB: [birth year], 49 y.o.   MRN: [id number] History of Present Illness: [name] is a 49 year old white female, single in for well woman gyn exam and pap.She complains of pain in right side for several months now.She is sp ablation and she is having hot flashes, has SUI. Had EGD and colonoscopy recently, has Barrett's esophagus.She is a smoker. PCP is Dr Cindie Laroche.  Current Medications, Allergies, Past Medical History, Past Surgical History, Family History and Social History were reviewed in [employer] record.     Review of Systems: Patient denies any headaches, hearing loss, fatigue, blurred vision, shortness of breath, chest pain,problems with bowel movements,  or intercourse(not having sex currently). No joint pain or mood swings.Complains of weight gain since foot surgery See HPI for positives.   Physical Exam:BP 120/84 mmHg  Pulse 88  Ht 5' 4.25" (1.632 m)  Wt 275 lb (124.739 kg)  BMI 46.83 kg/m2 General:  Well developed, well nourished, no acute distress Skin:  Warm and dry Neck:  Midline trachea, normal thyroid, good ROM, no lymphadenopathy Lungs; Clear to auscultation bilaterally Breast:  No dominant palpable mass, retraction, or nipple discharge Cardiovascular: Regular rate and rhythm Abdomen:  Soft, non tender, no hepatosplenomegaly Pelvic:  External genitalia is normal in appearance, no lesions.  The vagina is normal in appearance. Urethra has no lesions or masses. The cervix is bulbous. Pap with HPV and GC/CHL performed. Uterus is felt to be normal size, shape, and contour.  No adnexal masses, RLQ tenderness noted.Bladder is non tender, no masses felt.Difficult secondary to abdominal girth Rectal: Good sphincter tone, no polyps, or hemorrhoids felt.  Hemoccult negative. Extremities/musculoskeletal:  No swelling or varicosities noted, no clubbing or cyanosis,had surgery left foot recently Psych:  No mood changes, alert and  cooperative,seems happy   Impression: Well woman gyn exam with pap RLQ pain  Hot flashes  Plan: Return in 1 week for gyn [place] Check CBC,CMP,TSH,FSH and ESR Physical in 1 year Mammogram yearly Colonoscopy per Dr Laural Golden Review handout on pelvic pain

## 2015-04-11 NOTE — Patient Instructions (Signed)
Pelvic Pain Female pelvic pain can be caused by many different things and start from a variety of places. Pelvic pain refers to pain that is located in the lower half of the abdomen and between your hips. The pain may occur over a short period of time (acute) or may be reoccurring (chronic). The cause of pelvic pain may be related to disorders affecting the female reproductive organs (gynecologic), but it may also be related to the bladder, kidney stones, an intestinal complication, or muscle or skeletal problems. Getting help right away for pelvic pain is important, especially if there has been severe, sharp, or a sudden onset of unusual pain. It is also important to get help right away because some types of pelvic pain can be life threatening.  CAUSES  Below are only some of the causes of pelvic pain. The causes of pelvic pain can be in one of several categories.   Gynecologic.  Pelvic inflammatory disease.  Sexually transmitted infection.  Ovarian cyst or a twisted ovarian ligament (ovarian torsion).  Uterine lining that grows outside the uterus (endometriosis).  Fibroids, cysts, or tumors.  Ovulation.  Pregnancy.  Pregnancy that occurs outside the uterus (ectopic pregnancy).  Miscarriage.  Labor.  Abruption of the placenta or ruptured uterus.  Infection.  Uterine infection (endometritis).  Bladder infection.  Diverticulitis.  Miscarriage related to a uterine infection (septic abortion).  Bladder.  Inflammation of the bladder (cystitis).  Kidney stone(s).  Gastrointestinal.  Constipation.  Diverticulitis.  Neurologic.  Trauma.  Feeling pelvic pain because of mental or emotional causes (psychosomatic).  Cancers of the bowel or pelvis. EVALUATION  Your caregiver will want to take a careful history of your concerns. This includes recent changes in your health, a careful gynecologic history of your periods (menses), and a sexual history. Obtaining your family  history and medical history is also important. Your caregiver may suggest a pelvic exam. A pelvic exam will help identify the location and severity of the pain. It also helps in the evaluation of which organ system may be involved. In order to identify the cause of the pelvic pain and be properly treated, your caregiver may order tests. These tests may include:   A pregnancy test.  Pelvic ultrasonography.  An X-ray exam of the abdomen.  A urinalysis or evaluation of vaginal discharge.  Blood tests. HOME CARE INSTRUCTIONS   Only take over-the-counter or prescription medicines for pain, discomfort, or fever as directed by your caregiver.   Rest as directed by your caregiver.   Eat a balanced diet.   Drink enough fluids to make your urine clear or pale yellow, or as directed.   Avoid sexual intercourse if it causes pain.   Apply warm or cold compresses to the lower abdomen depending on which one helps the pain.   Avoid stressful situations.   Keep a journal of your pelvic pain. Write down when it started, where the pain is located, and if there are things that seem to be associated with the pain, such as food or your menstrual cycle.  Follow up with your caregiver as directed.  SEEK MEDICAL CARE IF:  Your medicine does not help your pain.  You have abnormal vaginal discharge. SEEK IMMEDIATE MEDICAL CARE IF:   You have heavy bleeding from the vagina.   Your pelvic pain increases.   You feel light-headed or faint.   You have chills.   You have pain with urination or blood in your urine.   You have uncontrolled diarrhea   or vomiting.   You have a fever or persistent symptoms for more than 3 days.  You have a fever and your symptoms suddenly get worse.   You are being physically or sexually abused.  MAKE SURE YOU:  Understand these instructions.  Will watch your condition.  Will get help if you are not doing well or get worse. Document Released:  06/11/2004 Document Revised: 11/29/2013 Document Reviewed: 11/04/2011 [facility] Patient Information 2015 [place], [place]. This information is not intended to replace advice given to you by your health care provider. Make sure you discuss any questions you have with your health care provider. Return in 1 week for [place]  Physical in 1 year Mammogram yearly

## 2015-04-12 ENCOUNTER — Telehealth: Payer: Self-pay | Admitting: Adult Health

## 2015-04-12 LAB — COMPREHENSIVE METABOLIC PANEL
A/G RATIO: 1.8 (ref 1.1–2.5)
ALK PHOS: 113 IU/L (ref 39–117)
ALT: 44 IU/L — AB (ref 0–32)
AST: 25 IU/L (ref 0–40)
Albumin: 4.6 g/dL (ref 3.5–5.5)
BUN/Creatinine Ratio: 16 (ref 9–23)
BUN: 15 mg/dL (ref 6–24)
Bilirubin Total: <0.2 mg/dL (ref 0.0–1.2)
CALCIUM: 9.5 mg/dL (ref 8.7–10.2)
CHLORIDE: 97 mmol/L (ref 97–108)
CO2: 25 mmol/L (ref 18–29)
Creatinine, Ser: 0.92 mg/dL (ref 0.57–1.00)
GFR calc Af Amer: 85 mL/min/{1.73_m2} (ref >59)
GFR calc non Af Amer: 73 mL/min/{1.73_m2} (ref >59)
GLOBULIN, TOTAL: 2.6 g/dL (ref 1.5–4.5)
Glucose: 93 mg/dL (ref 65–99)
POTASSIUM: 4.9 mmol/L (ref 3.5–5.2)
SODIUM: 140 mmol/L (ref 134–144)
Total Protein: 7.2 g/dL (ref 6.0–8.5)

## 2015-04-12 LAB — CBC
Hematocrit: 43.6 % (ref 34.0–46.6)
Hemoglobin: 14.3 g/dL (ref 11.1–15.9)
MCH: 31 pg (ref 26.6–33.0)
MCHC: 32.8 g/dL (ref 31.5–35.7)
MCV: 94 fL (ref 79–97)
PLATELETS: 390 10*3/uL — AB (ref 150–379)
RBC: 4.62 x10E6/uL (ref 3.77–5.28)
RDW: 13.2 % (ref 12.3–15.4)
WBC: 8.6 10*3/uL (ref 3.4–10.8)

## 2015-04-12 LAB — SEDIMENTATION RATE: SED RATE: 6 mm/h (ref 0–32)

## 2015-04-12 LAB — TSH: TSH: 1.27 u[IU]/mL (ref 0.450–4.500)

## 2015-04-12 LAB — FOLLICLE STIMULATING HORMONE: FSH: 39.8 m[IU]/mL

## 2015-04-12 NOTE — Telephone Encounter (Signed)
Pt aware of labs  

## 2015-04-13 ENCOUNTER — Telehealth: Payer: Self-pay | Admitting: Adult Health

## 2015-04-13 ENCOUNTER — Ambulatory Visit (INDEPENDENT_AMBULATORY_CARE_PROVIDER_SITE_OTHER): Payer: Medicaid Other

## 2015-04-13 DIAGNOSIS — R1031 Right lower quadrant pain: Secondary | ICD-10-CM | POA: Diagnosis not present

## 2015-04-13 LAB — CYTOLOGY - PAP

## 2015-04-13 MED ORDER — HYDROCODONE-ACETAMINOPHEN 5-325 MG PO TABS: 1.0000 | ORAL_TABLET | Freq: Four times a day (QID) | ORAL | Status: DC | PRN

## 2015-04-13 NOTE — Telephone Encounter (Signed)
Had [place] today wants pain meds, will give 30 norco 5-325 mg, 1 every 6 hours prn pain no refills

## 2015-04-13 NOTE — Telephone Encounter (Signed)
Left message that [place] was normal per Dr Elonda Husky, pain could be muscle wall but female related

## 2015-04-13 NOTE — Telephone Encounter (Signed)
[facility] pt got 81 percocet 8/23 from Dr Lorriane Shire, she told me she did not have any more, but will cancel norco rx and pharmacist will tell her to see Dr Lorriane Shire for pain meds

## 2015-04-13 NOTE — Progress Notes (Signed)
PELVIC US TA/TV, normal anteverted uterus,normal ov's bilat (mobile), EEC 1.82mm,no free fluid seen,pt complained of some pain on rt during ultrasound

## 2015-08-02 ENCOUNTER — Other Ambulatory Visit (HOSPITAL_COMMUNITY): Payer: Self-pay | Admitting: Family Medicine

## 2015-08-02 DIAGNOSIS — M545 Low back pain: Secondary | ICD-10-CM

## 2015-08-09 ENCOUNTER — Ambulatory Visit (HOSPITAL_COMMUNITY): Payer: Medicaid Other

## 2015-08-10 ENCOUNTER — Ambulatory Visit (HOSPITAL_COMMUNITY)
Admission: RE | Admit: 2015-08-10 | Discharge: 2015-08-10 | Disposition: A | Discharge status: Home / Self Care | Payer: Medicaid Other | Source: Ambulatory Visit | Attending: Family Medicine | Admitting: Family Medicine

## 2015-08-10 ENCOUNTER — Other Ambulatory Visit (HOSPITAL_COMMUNITY): Payer: Self-pay | Admitting: Family Medicine

## 2015-08-10 DIAGNOSIS — M160 Bilateral primary osteoarthritis of hip: Secondary | ICD-10-CM | POA: Insufficient documentation

## 2015-08-14 ENCOUNTER — Ambulatory Visit (INDEPENDENT_AMBULATORY_CARE_PROVIDER_SITE_OTHER): Payer: Medicaid Other | Admitting: Orthopedic Surgery

## 2015-08-14 VITALS — BP 130/88 | Ht 64.25 in | Wt 254.0 lb

## 2015-08-14 DIAGNOSIS — M170 Bilateral primary osteoarthritis of knee: Secondary | ICD-10-CM

## 2015-08-14 NOTE — Progress Notes (Signed)
Chief Complaint  Patient presents with  . Follow-up    bilateral knee pain, requests injections     Procedure note left knee injection verbal consent was obtained to inject left knee joint  Timeout was completed to confirm the site of injection  The medications used were 40 mg of Depo-Medrol and 1% lidocaine 3 cc  Anesthesia was provided by ethyl chloride and the skin was prepped with alcohol.  After cleaning the skin with alcohol a 20-gauge needle was used to inject the left knee joint. There were no complications. A sterile bandage was applied.   Procedure note right knee injection verbal consent was obtained to inject right knee joint  Timeout was completed to confirm the site of injection  The medications used were 40 mg of Depo-Medrol and 1% lidocaine 3 cc  Anesthesia was provided by ethyl chloride and the skin was prepped with alcohol.  After cleaning the skin with alcohol a 20-gauge needle was used to inject the right knee joint. There were no complications. A sterile bandage was applied.   

## 2015-09-04 ENCOUNTER — Encounter (INDEPENDENT_AMBULATORY_CARE_PROVIDER_SITE_OTHER): Payer: Self-pay | Admitting: Internal Medicine

## 2015-09-05 ENCOUNTER — Ambulatory Visit (INDEPENDENT_AMBULATORY_CARE_PROVIDER_SITE_OTHER): Payer: Medicare Other | Admitting: Urology

## 2015-09-05 DIAGNOSIS — R350 Frequency of micturition: Secondary | ICD-10-CM

## 2015-09-05 DIAGNOSIS — N3946 Mixed incontinence: Secondary | ICD-10-CM

## 2015-09-05 DIAGNOSIS — N8111 Cystocele, midline: Secondary | ICD-10-CM

## 2015-11-13 ENCOUNTER — Other Ambulatory Visit: Payer: Self-pay | Admitting: *Deleted

## 2015-11-13 MED ORDER — DICLOFENAC POTASSIUM 50 MG PO TABS: 50.0000 mg | ORAL_TABLET | Freq: Two times a day (BID) | ORAL | Status: DC

## 2015-12-05 ENCOUNTER — Ambulatory Visit: Payer: Self-pay | Admitting: Urology

## 2016-01-17 ENCOUNTER — Encounter: Payer: Self-pay | Admitting: Orthopedic Surgery

## 2016-01-17 ENCOUNTER — Ambulatory Visit (INDEPENDENT_AMBULATORY_CARE_PROVIDER_SITE_OTHER): Payer: Medicare Other | Admitting: Orthopedic Surgery

## 2016-01-17 VITALS — BP 124/87 | HR 76 | Temp 97.7°F | Ht 64.5 in | Wt 256.0 lb

## 2016-01-17 DIAGNOSIS — M7551 Bursitis of right shoulder: Secondary | ICD-10-CM | POA: Diagnosis not present

## 2016-01-17 NOTE — Progress Notes (Signed)
Chief Complaint  Patient presents with  . Follow-up    Right shoulder pain    BP 124/87 mmHg  Pulse 76  Temp(Src) 97.7 F (36.5 C)  Ht 5' 4.5" (1.638 m)  Wt 256 lb (116.121 kg)  BMI 43.28 kg/m2   Procedure note the subacromial injection shoulder RIGHT  Verbal consent was obtained to inject the  RIGHT   Shoulder  Timeout was completed to confirm the injection site is a subacromial space of the  RIGHT  shoulder   Medication used Depo-Medrol 40 mg and lidocaine 1% 3 cc  Anesthesia was provided by ethyl chloride  The injection was performed in the RIGHT  posterior subacromial space. After pinning the skin with alcohol and anesthetized the skin with ethyl chloride the subacromial space was injected using a 20-gauge needle. There were no complications  Sterile dressing was applied.

## 2016-02-01 ENCOUNTER — Encounter (INDEPENDENT_AMBULATORY_CARE_PROVIDER_SITE_OTHER): Payer: Self-pay | Admitting: Internal Medicine

## 2016-02-01 ENCOUNTER — Ambulatory Visit (INDEPENDENT_AMBULATORY_CARE_PROVIDER_SITE_OTHER): Payer: Medicare Other | Admitting: Internal Medicine

## 2016-02-01 VITALS — BP 144/72 | HR 68 | Temp 97.0°F | Ht 64.5 in | Wt 258.9 lb

## 2016-02-01 DIAGNOSIS — K219 Gastro-esophageal reflux disease without esophagitis: Secondary | ICD-10-CM

## 2016-02-01 NOTE — Progress Notes (Signed)
Subjective:    Patient ID: [name], female    DOB: Feb 23, 1966, 50 y.o.   MRN: [id number]  HPI Here today for f/u of her chronic GERD. Her acid reflux is controlled with Protonix. She is trying watch what she eats. Her appetite is good,. No weight loss. There is no abdominal pain. She has a BM about at least every other day. She says her BM are very large and she occasionally see blood. She occasionally take MOM for constipation.  Hx of colon cancer in an uncle who was diagnosed in his 54s.  She underwent a colonoscopy/EGD in 2016.  (Please see below). She is not exercising at this time. She has pain left foot and is planning to have foot surgery.           02/24/2015 EGD & Colonoscopy  Indications: Patient is 50 year old Caucasian female was chronic GERD and presently on double dose PPI and still having breakthrough symptoms. She is undergoing diagnostic EGD followed by screening colonoscopy. Family history is significant for colon carcinoma in paternal uncle who was in his 85s the time of diagnosis.  Impression:  EGD findings: Small sliding hiatal hernia with short tongue of salmon colored mucosa at distal esophagus. Was biopsied for routine histology. No evidence of erosive esophagitis, gastritis or peptic ulcer disease.  Colonoscopy findings: Examination performed to cecum. No evidence of colonic polyps or other abnormalities but examination somewhat suboptimal because of quality of prep. Small external hemorrhoids.   11/09/2014 Ribs: negative  10/12/2014 NA 139, K 4.6, Chloride 101, BUN 16, Creatinine 0.78, total bili 0.4, ALP 94, AST 19, ALT 22, Total protein 6.9, Albumin 4.3, Calcium 9.3, Lipase 13. 08/14/2014 Hemoglobin 14.   Review of Systems Past Medical History  Diagnosis Date  . IBS (irritable bowel syndrome)   . GERD (gastroesophageal reflux  disease)     IBS  . Arthritis     disk disease  . Vocal cord cancer ([place])   . Fibromyalgia   . Depression   . Peripheral arterial disease ([place])   . Chronic pain   . Headache   . PTSD (post-traumatic stress disorder)   . ADHD (attention deficit hyperactivity disorder)   . Plantar fasciitis of left foot   . Barrett esophagus   . RLQ abdominal pain 04/11/2015  . Obesity   . Hot flashes 04/11/2015    Past Surgical History  Procedure Laterality Date  . Carpal tunnel release Bilateral   . Nose surgery    . Tonsillectomy    . Cleft palate repair    . Laparoscopic tubal ligation      [facility]  . Throat surgery  2000    for throat cancer/no problems intubation...[facility]  . Endometrial ablation    . Anterior lat lumbar fusion  03/19/2012    Procedure: ANTERIOR LATERAL LUMBAR FUSION 1 LEVEL;  Surgeon: Faythe Ghee, MD;  Location: [facility];  Service: Neurosurgery;  Laterality: Right;  Anterolateral Lumbar Interbody Fusion, Lumbar Three-Four (Hand broke through glove and nat in the room)  . Anterior cervical decomp/discectomy fusion  08/18/2012    Procedure: ANTERIOR CERVICAL DECOMPRESSION/DISCECTOMY FUSION 2 LEVELS;  Surgeon: Faythe Ghee, MD;  Location: [facility];  Service: Neurosurgery;  Laterality: N/A;  anterior cervical five-six,six-seven decompression fusion plating  . Tubal ligation    . Shoulder open rotator cuff repair Right 04/02/2013    Procedure: ROTATOR CUFF REPAIR SHOULDER OPEN;  Surgeon: Carole Civil, MD;  Location: [facility];  Service: Orthopedics;  Laterality: Right;  . Bunionectomy Left 08/04/2014    Procedure: LEFT FOOT LAPIDUS BUNION CORRECTION, LEFT SECOND TARSALMETATARSAL JOINT DEBRIDEMENT AND LEFT MODIFIED MCBRIDE BUNIONECTOMY;  Surgeon: [name], MD;  Location: [facility];  Service: Orthopedics;  Laterality: Left;  . Colonoscopy N/A 02/16/2015    Procedure: COLONOSCOPY;  Surgeon: Rogene Houston, MD;  Location: [facility];   Service: Endoscopy;  Laterality: N/A;  155  . Esophagogastroduodenoscopy N/A 02/16/2015    Procedure: ESOPHAGOGASTRODUODENOSCOPY (EGD);  Surgeon: Rogene Houston, MD;  Location: [facility];  Service: Endoscopy;  Laterality: N/A;    Allergies  Allergen Reactions  . Bee Venom Anaphylaxis    Current Outpatient Prescriptions on File Prior to Visit  Medication Sig Dispense Refill  . Aspirin-Salicylamide-Caffeine (BC HEADACHE) 325-95-16 MG TABS Take 1 Package by mouth daily as needed (pain).    . diazepam (VALIUM) 2 MG tablet Take 5 mg by mouth 4 (four) times daily.     . diclofenac (CATAFLAM) 50 MG tablet Take 1 tablet (50 mg total) by mouth 2 (two) times daily. 60 tablet 5  . EPINEPHrine 0.3 mg/0.3 mL IJ SOAJ injection Inject into the muscle once.    . escitalopram (LEXAPRO) 20 MG tablet Take 20 mg by mouth daily.    . fesoterodine (TOVIAZ) 4 MG TB24 tablet Take 4 mg by mouth daily.    Marland Kitchen gabapentin (NEURONTIN) 300 MG capsule Take by mouth 4 (four) times daily. Takes 2 tabs TID    . lisdexamfetamine (VYVANSE) 50 MG capsule Take 70 mg by mouth daily.     Marland Kitchen oxyCODONE-acetaminophen (PERCOCET) 5-325 MG per tablet Take 1 tablet by mouth every 4 (four) hours as needed for severe pain.    . pantoprazole (PROTONIX) 40 MG tablet Take 1 tablet (40 mg total) by mouth 2 (two) times daily. 60 tablet 3  . varenicline (CHANTIX) 1 MG tablet Take 1 mg by mouth 2 (two) times daily.     No current facility-administered medications on file prior to visit.        Objective:   Physical Exam Blood pressure 144/72, pulse 68, temperature 97 F (36.1 C), height 5' 4.5" (1.638 m), weight 258 lb 14.4 oz (117.436 kg).  Alert and oriented. Skin warm and dry. Oral mucosa is moist.    Sclera anicteric, conjunctivae is pink. Thyroid not enlarged. No cervical lymphadenopathy. Lungs clear. Heart regular rate and rhythm.  Abdomen is soft. Bowel sounds are positive. No hepatomegaly. No abdominal masses felt. No  tenderness.  No edema to lower extremities.  Moves all extremities. Walks without a limp.        Assessment & Plan:  GERD controlled at this time with Protonix. OV in 1 year.

## 2016-02-01 NOTE — Patient Instructions (Signed)
Continue the protonix. OV in 1 year.

## 2016-03-15 ENCOUNTER — Other Ambulatory Visit: Payer: Self-pay | Admitting: Neurosurgery

## 2016-03-15 DIAGNOSIS — G8929 Other chronic pain: Secondary | ICD-10-CM

## 2016-03-15 DIAGNOSIS — M545 Low back pain: Principal | ICD-10-CM

## 2016-03-27 ENCOUNTER — Ambulatory Visit
Admission: RE | Admit: 2016-03-27 | Discharge: 2016-03-27 | Disposition: A | Discharge status: Home / Self Care | Payer: Medicare Other | Source: Ambulatory Visit | Attending: Neurosurgery | Admitting: Neurosurgery

## 2016-03-27 DIAGNOSIS — M545 Low back pain: Principal | ICD-10-CM

## 2016-03-27 DIAGNOSIS — G8929 Other chronic pain: Secondary | ICD-10-CM

## 2016-03-27 MED ORDER — ONDANSETRON HCL 4 MG/2ML IJ SOLN
4.0000 mg | Freq: Once | INTRAMUSCULAR | Status: AC
  Administered 2016-03-27: 4 mg via INTRAMUSCULAR

## 2016-03-27 MED ORDER — IOPAMIDOL (ISOVUE-M 200) INJECTION 41%
15.0000 mL | Freq: Once | INTRAMUSCULAR | Status: AC
  Administered 2016-03-27: 15 mL via INTRATHECAL

## 2016-03-27 MED ORDER — MEPERIDINE HCL 100 MG/ML IJ SOLN
100.0000 mg | Freq: Once | INTRAMUSCULAR | Status: AC
  Administered 2016-03-27: 100 mg via INTRAMUSCULAR

## 2016-03-27 MED ORDER — DIAZEPAM 5 MG PO TABS
10.0000 mg | ORAL_TABLET | Freq: Once | ORAL | Status: AC
  Administered 2016-03-27: 10 mg via ORAL

## 2016-03-27 MED ORDER — ONDANSETRON HCL 4 MG/2ML IJ SOLN: 4.0000 mg | Freq: Four times a day (QID) | INTRAMUSCULAR | Status: DC | PRN

## 2016-03-27 NOTE — Progress Notes (Signed)
Patient states she has been off Lexapro and Vyvanse for at least the past two days.  jkl

## 2016-03-27 NOTE — Discharge Instructions (Signed)
Myelogram Discharge Instructions  1. Go home and rest quietly for the next 24 hours.  It is important to lie flat for the next 24 hours.  Get up only to go to the restroom.  You may lie in the bed or on a couch on your back, your stomach, your left side or your right side.  You may have one pillow under your head.  You may have pillows between your knees while you are on your side or under your knees while you are on your back.  2. DO NOT drive today.  Recline the seat as far back as it will go, while still wearing your seat belt, on the way home.  3. You may get up to go to the bathroom as needed.  You may sit up for 10 minutes to eat.  You may resume your normal diet and medications unless otherwise indicated.  Drink lots of extra fluids today and tomorrow.  4. The incidence of headache, nausea, or vomiting is about 5% (one in 20 patients).  If you develop a headache, lie flat and drink plenty of fluids until the headache goes away.  Caffeinated beverages may be helpful.  If you develop severe nausea and vomiting or a headache that does not go away with flat bed rest, call [phone].  5. You may resume normal activities after your 24 hours of bed rest is over; however, do not exert yourself strongly or do any heavy lifting tomorrow. If when you get up you have a headache when standing, go back to bed and force fluids for another 24 hours.  6. Call your physician for a follow-up appointment.  The results of your myelogram will be sent directly to your physician by the following day.  7. If you have any questions or if complications develop after you arrive home, please call [phone].  Discharge instructions have been explained to the patient.  The patient, or the person responsible for the patient, fully understands these instructions.       [place] ON AUG. 31, 2017, AFTER 10:30 AM.

## 2016-03-29 ENCOUNTER — Telehealth: Payer: Self-pay

## 2016-03-29 NOTE — Telephone Encounter (Signed)
LMOM (cell/home) asking patient how she is doing after her myelogram here 03/27/16.  Told her to give [place] a call if she has any questions/problems.  jkl

## 2016-04-03 ENCOUNTER — Encounter (HOSPITAL_COMMUNITY): Payer: Self-pay

## 2016-04-03 ENCOUNTER — Emergency Department (HOSPITAL_COMMUNITY)
Admission: EM | Admit: 2016-04-03 | Discharge: 2016-04-03 | Disposition: A | Discharge status: Home / Self Care | Payer: Medicare Other | Attending: Emergency Medicine | Admitting: Emergency Medicine

## 2016-04-03 DIAGNOSIS — F17210 Nicotine dependence, cigarettes, uncomplicated: Secondary | ICD-10-CM | POA: Diagnosis not present

## 2016-04-03 DIAGNOSIS — S41012D Laceration without foreign body of left shoulder, subsequent encounter: Secondary | ICD-10-CM | POA: Diagnosis present

## 2016-04-03 DIAGNOSIS — Z7982 Long term (current) use of aspirin: Secondary | ICD-10-CM | POA: Insufficient documentation

## 2016-04-03 DIAGNOSIS — F909 Attention-deficit hyperactivity disorder, unspecified type: Secondary | ICD-10-CM | POA: Diagnosis not present

## 2016-04-03 DIAGNOSIS — Z5189 Encounter for other specified aftercare: Secondary | ICD-10-CM

## 2016-04-03 MED ORDER — BACITRACIN ZINC 500 UNIT/GM EX OINT
TOPICAL_OINTMENT | CUTANEOUS | Status: AC
  Administered 2016-04-03: 1
  Filled 2016-04-03: qty 0.9
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
## 2016-04-03 NOTE — ED Notes (Signed)
Wound cleansed, bacitracin applied, non stick Telfa applied, secured with tape.
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
## 2016-04-03 NOTE — Discharge Instructions (Signed)
Do not use peroxide or alcohol any more. Do clean the wound daily with soap and water. Use antibiotic ointment on the wound. Recheck if it gets infected (painful, more swollen, red, drains pus).

## 2016-05-23 ENCOUNTER — Other Ambulatory Visit (HOSPITAL_COMMUNITY): Payer: Self-pay | Admitting: Family Medicine

## 2016-05-23 ENCOUNTER — Ambulatory Visit (HOSPITAL_COMMUNITY)
Admission: RE | Admit: 2016-05-23 | Discharge: 2016-05-23 | Disposition: A | Discharge status: Home / Self Care | Payer: Medicare Other | Source: Ambulatory Visit | Attending: Family Medicine | Admitting: Family Medicine

## 2016-05-23 DIAGNOSIS — R2231 Localized swelling, mass and lump, right upper limb: Secondary | ICD-10-CM

## 2016-05-23 DIAGNOSIS — M25441 Effusion, right hand: Secondary | ICD-10-CM | POA: Diagnosis not present

## 2016-05-27 ENCOUNTER — Other Ambulatory Visit: Payer: Self-pay | Admitting: Orthopedic Surgery

## 2016-07-09 ENCOUNTER — Other Ambulatory Visit: Payer: Self-pay | Admitting: Neurosurgery

## 2016-07-19 ENCOUNTER — Other Ambulatory Visit: Payer: Self-pay | Admitting: Orthopedic Surgery

## 2016-07-26 NOTE — Pre-Procedure Instructions (Addendum)
[name]  07/26/2016      [facility] - [place], [place] - [address] [place] [address] Phone: [phone] Fax: [phone]    Your procedure is scheduled on January 11  Report to [facility] [place] at 07:45 A.M.  Call this number if you have problems the morning of surgery:  [phone]   Remember:  Do not eat food or drink liquids after midnight.   Take these medicines the morning of surgery with A SIP OF WATER albuterol (PROVENTIL HFA;VENTOLIN HFA), diazepam (VALIUM, escitalopram (LEXAPRO), gabapentin (NEURONTIN), mirabegron ER (MYRBETRIQ), oxyCODONE-acetaminophen (PERCOCET),pantoprazole (PROTONIX)  7 days prior to surgery STOP taking any Aspirin, Aleve, Naproxen, Ibuprofen, Motrin, Advil, Goody's, BC's, all herbal medications, fish oil, and all vitamins    Do not wear jewelry, make-up or nail polish.   Do not wear lotions, powders, or perfumes, or deoderant.   Do not shave 48 hours prior to surgery.     Do not bring valuables to the hospital.   [facility] is not responsible for any belongings or valuables.  Contacts, dentures or bridgework may not be worn into surgery.  Leave your suitcase in the car.  After surgery it may be brought to your room.  For patients admitted to the hospital, discharge time will be determined by your treatment team.  Patients discharged the day of surgery will not be allowed to drive home.    Special instructions:   [place]- Preparing For Surgery  Before surgery, you can play an important role. Because skin is not sterile, your skin needs to be as free of germs as possible. You can reduce the number of germs on your skin by washing with CHG (chlorahexidine gluconate) Soap before surgery.  CHG is an antiseptic cleaner which kills germs and bonds with the skin to continue killing germs even after washing.  Please do not use if you have an allergy to CHG or antibacterial  soaps. If your skin becomes reddened/irritated stop using the CHG.  Do not shave (including legs and underarms) for at least 48 hours prior to first CHG shower. It is OK to shave your face.  Please follow these instructions carefully.   1. Shower the NIGHT BEFORE SURGERY and the MORNING OF SURGERY with CHG.   2. If you chose to wash your hair, wash your hair first as usual with your normal shampoo.  3. After you shampoo, rinse your hair and body thoroughly to remove the shampoo.  4. Use CHG as you would any other liquid soap. You can apply CHG directly to the skin and wash gently with a scrungie or a clean washcloth.   5. Apply the CHG Soap to your body ONLY FROM THE NECK DOWN.  Do not use on open wounds or open sores. Avoid contact with your eyes, ears, mouth and genitals (private parts). Wash genitals (private parts) with your normal soap.  6. Wash thoroughly, paying special attention to the area where your surgery will be performed.  7. Thoroughly rinse your body with warm water from the neck down.  8. DO NOT shower/wash with your normal soap after using and rinsing off the CHG Soap.  9. Pat yourself dry with a CLEAN TOWEL.   10. Wear CLEAN PAJAMAS   11. Place CLEAN SHEETS on your bed the night of your first shower and DO NOT SLEEP WITH PETS.    Day of Surgery: Do not apply any deodorants/lotions. Please wear  clean clothes to the hospital/surgery center.      Please read over the following fact sheets that you were given.

## 2016-07-30 ENCOUNTER — Encounter (HOSPITAL_COMMUNITY)
Admission: RE | Admit: 2016-07-30 | Discharge: 2016-07-30 | Disposition: A | Discharge status: Home / Self Care | Payer: Medicare Other | Source: Ambulatory Visit | Attending: Neurosurgery | Admitting: Neurosurgery

## 2016-07-30 ENCOUNTER — Encounter (HOSPITAL_COMMUNITY): Payer: Self-pay

## 2016-07-30 DIAGNOSIS — M4316 Spondylolisthesis, lumbar region: Secondary | ICD-10-CM | POA: Diagnosis not present

## 2016-07-30 DIAGNOSIS — Z01812 Encounter for preprocedural laboratory examination: Secondary | ICD-10-CM | POA: Diagnosis present

## 2016-07-30 HISTORY — DX: Anxiety disorder, unspecified: F41.9

## 2016-07-30 HISTORY — DX: Chronic obstructive pulmonary disease, unspecified: J44.9

## 2016-07-30 HISTORY — DX: Dyspnea, unspecified: R06.00

## 2016-07-30 LAB — COMPREHENSIVE METABOLIC PANEL
ALBUMIN: 3.8 g/dL (ref 3.5–5.0)
ALT: 35 U/L (ref 14–54)
ANION GAP: 8 (ref 5–15)
AST: 29 U/L (ref 15–41)
Alkaline Phosphatase: 81 U/L (ref 38–126)
BUN: 13 mg/dL (ref 6–20)
CHLORIDE: 109 mmol/L (ref 101–111)
CO2: 22 mmol/L (ref 22–32)
Calcium: 9.2 mg/dL (ref 8.9–10.3)
Creatinine, Ser: 0.81 mg/dL (ref 0.44–1.00)
GFR calc Af Amer: >60 mL/min (ref >60)
GFR calc non Af Amer: >60 mL/min (ref >60)
Glucose, Bld: 98 mg/dL (ref 65–99)
POTASSIUM: 4 mmol/L (ref 3.5–5.1)
SODIUM: 139 mmol/L (ref 135–145)
Total Bilirubin: 0.5 mg/dL (ref 0.3–1.2)
Total Protein: 7 g/dL (ref 6.5–8.1)

## 2016-07-30 LAB — CBC
HEMATOCRIT: 43.7 % (ref 36.0–46.0)
HEMOGLOBIN: 14 g/dL (ref 12.0–15.0)
MCH: 30.8 pg (ref 26.0–34.0)
MCHC: 32 g/dL (ref 30.0–36.0)
MCV: 96 fL (ref 78.0–100.0)
Platelets: 308 10*3/uL (ref 150–400)
RBC: 4.55 MIL/uL (ref 3.87–5.11)
RDW: 13.2 % (ref 11.5–15.5)
WBC: 8.6 10*3/uL (ref 4.0–10.5)

## 2016-07-30 LAB — SURGICAL PCR SCREEN
MRSA, PCR: NEGATIVE
Staphylococcus aureus: NEGATIVE

## 2016-07-30 LAB — TYPE AND SCREEN
ABO/RH(D): A NEG
Antibody Screen: NEGATIVE

## 2016-07-30 NOTE — Progress Notes (Signed)
Pt. Brought to tears as she spoke about a previous experience with surgical site infection. Pt. Remarks that she became very constipation & bloated post op & had to have an NG tube placed & then the tube leaked the drainage on to her back when she got OOB for the bathroom & she reports that she then had a SSI & was hospitalized for 13 days & had to go home with a PICC line in place for antibiotics.  Pt. Reports that she needs stool softners ordered for after surgery so she doesn'[t have the same trouble again. Reassurance given to pt. & encouraged all the instructions for surgery prep.

## 2016-08-08 ENCOUNTER — Inpatient Hospital Stay (HOSPITAL_COMMUNITY): Payer: Medicare Other | Admitting: Anesthesiology

## 2016-08-08 ENCOUNTER — Encounter (HOSPITAL_COMMUNITY): Payer: Self-pay | Admitting: Anesthesiology

## 2016-08-08 ENCOUNTER — Inpatient Hospital Stay (HOSPITAL_COMMUNITY): Payer: Medicare Other

## 2016-08-08 ENCOUNTER — Encounter (HOSPITAL_COMMUNITY)
Admission: RE | Disposition: A | Discharge status: Home / Self Care | Payer: Self-pay | Source: Ambulatory Visit | Attending: Neurosurgery

## 2016-08-08 ENCOUNTER — Inpatient Hospital Stay (HOSPITAL_COMMUNITY)
Admission: RE | Admit: 2016-08-08 | Discharge: 2016-08-10 | DRG: 455 | Disposition: A | Discharge status: Home / Self Care | Payer: Medicare Other | Source: Ambulatory Visit | Attending: Neurosurgery | Admitting: Neurosurgery

## 2016-08-08 DIAGNOSIS — Z885 Allergy status to narcotic agent status: Secondary | ICD-10-CM

## 2016-08-08 DIAGNOSIS — F4310 Post-traumatic stress disorder, unspecified: Secondary | ICD-10-CM | POA: Diagnosis present

## 2016-08-08 DIAGNOSIS — Z888 Allergy status to other drugs, medicaments and biological substances status: Secondary | ICD-10-CM

## 2016-08-08 DIAGNOSIS — F329 Major depressive disorder, single episode, unspecified: Secondary | ICD-10-CM | POA: Diagnosis present

## 2016-08-08 DIAGNOSIS — F909 Attention-deficit hyperactivity disorder, unspecified type: Secondary | ICD-10-CM | POA: Diagnosis present

## 2016-08-08 DIAGNOSIS — Z8521 Personal history of malignant neoplasm of larynx: Secondary | ICD-10-CM | POA: Diagnosis not present

## 2016-08-08 DIAGNOSIS — M51369 Other intervertebral disc degeneration, lumbar region without mention of lumbar back pain or lower extremity pain: Secondary | ICD-10-CM

## 2016-08-08 DIAGNOSIS — Z79899 Other long term (current) drug therapy: Secondary | ICD-10-CM

## 2016-08-08 DIAGNOSIS — Z419 Encounter for procedure for purposes other than remedying health state, unspecified: Secondary | ICD-10-CM

## 2016-08-08 DIAGNOSIS — M5136 Other intervertebral disc degeneration, lumbar region: Secondary | ICD-10-CM | POA: Diagnosis present

## 2016-08-08 DIAGNOSIS — Z7982 Long term (current) use of aspirin: Secondary | ICD-10-CM

## 2016-08-08 DIAGNOSIS — J449 Chronic obstructive pulmonary disease, unspecified: Secondary | ICD-10-CM | POA: Diagnosis present

## 2016-08-08 DIAGNOSIS — E669 Obesity, unspecified: Secondary | ICD-10-CM | POA: Diagnosis present

## 2016-08-08 DIAGNOSIS — I739 Peripheral vascular disease, unspecified: Secondary | ICD-10-CM | POA: Diagnosis present

## 2016-08-08 DIAGNOSIS — F419 Anxiety disorder, unspecified: Secondary | ICD-10-CM | POA: Diagnosis present

## 2016-08-08 DIAGNOSIS — M797 Fibromyalgia: Secondary | ICD-10-CM | POA: Diagnosis present

## 2016-08-08 DIAGNOSIS — G8929 Other chronic pain: Secondary | ICD-10-CM | POA: Diagnosis present

## 2016-08-08 DIAGNOSIS — M4316 Spondylolisthesis, lumbar region: Secondary | ICD-10-CM | POA: Diagnosis present

## 2016-08-08 DIAGNOSIS — M5116 Intervertebral disc disorders with radiculopathy, lumbar region: Principal | ICD-10-CM | POA: Diagnosis present

## 2016-08-08 DIAGNOSIS — Z91030 Bee allergy status: Secondary | ICD-10-CM | POA: Diagnosis not present

## 2016-08-08 DIAGNOSIS — Z981 Arthrodesis status: Secondary | ICD-10-CM | POA: Diagnosis not present

## 2016-08-08 DIAGNOSIS — K219 Gastro-esophageal reflux disease without esophagitis: Secondary | ICD-10-CM | POA: Diagnosis present

## 2016-08-08 HISTORY — PX: MAXIMUM ACCESS (MAS)POSTERIOR LUMBAR INTERBODY FUSION (PLIF) 2 LEVEL: SHX6369

## 2016-08-08 LAB — GLUCOSE, CAPILLARY: GLUCOSE-CAPILLARY: 113 mg/dL — AB (ref 65–99)

## 2016-08-08 SURGERY — POSTERIOR LUMBAR FUSION 2 LEVEL
Anesthesia: General | Site: Back

## 2016-08-08 MED ORDER — SUGAMMADEX SODIUM 200 MG/2ML IV SOLN
INTRAVENOUS | Status: DC | PRN
  Administered 2016-08-08: 100 mg via INTRAVENOUS

## 2016-08-08 MED ORDER — PANTOPRAZOLE SODIUM 40 MG PO TBEC
40.0000 mg | DELAYED_RELEASE_TABLET | Freq: Two times a day (BID) | ORAL | Status: DC
  Administered 2016-08-08 – 2016-08-10 (×4): 40 mg via ORAL
  Filled 2016-08-08 (×4): qty 1

## 2016-08-08 MED ORDER — CEFAZOLIN SODIUM 1 G IJ SOLR
INTRAMUSCULAR | Status: DC | PRN
  Administered 2016-08-08: 2 g via INTRAMUSCULAR

## 2016-08-08 MED ORDER — LIDOCAINE 2% (20 MG/ML) 5 ML SYRINGE
INTRAMUSCULAR | Status: AC
  Filled 2016-08-08: qty 5

## 2016-08-08 MED ORDER — LACTATED RINGERS IV SOLN
INTRAVENOUS | Status: DC
  Administered 2016-08-08 – 2016-08-09 (×4): via INTRAVENOUS

## 2016-08-08 MED ORDER — DOCUSATE SODIUM 100 MG PO CAPS
100.0000 mg | ORAL_CAPSULE | Freq: Two times a day (BID) | ORAL | Status: DC
  Administered 2016-08-08 – 2016-08-10 (×4): 100 mg via ORAL
  Filled 2016-08-08 (×4): qty 1

## 2016-08-08 MED ORDER — VARENICLINE TARTRATE 1 MG PO TABS
1.0000 mg | ORAL_TABLET | Freq: Two times a day (BID) | ORAL | Status: DC
  Administered 2016-08-09 – 2016-08-10 (×3): 1 mg via ORAL
  Filled 2016-08-08 (×4): qty 1

## 2016-08-08 MED ORDER — SODIUM CHLORIDE 0.9% FLUSH
3.0000 mL | Freq: Two times a day (BID) | INTRAVENOUS | Status: DC
  Administered 2016-08-09 – 2016-08-10 (×2): 3 mL via INTRAVENOUS

## 2016-08-08 MED ORDER — ONDANSETRON HCL 4 MG/2ML IJ SOLN
INTRAMUSCULAR | Status: AC
  Filled 2016-08-08: qty 2

## 2016-08-08 MED ORDER — BACITRACIN ZINC 500 UNIT/GM EX OINT
TOPICAL_OINTMENT | CUTANEOUS | Status: DC | PRN
  Administered 2016-08-08: 1 via TOPICAL

## 2016-08-08 MED ORDER — SUCCINYLCHOLINE CHLORIDE 20 MG/ML IJ SOLN
INTRAMUSCULAR | Status: DC | PRN
  Administered 2016-08-08: 140 mg via INTRAVENOUS

## 2016-08-08 MED ORDER — LIDOCAINE HCL (CARDIAC) 20 MG/ML IV SOLN
INTRAVENOUS | Status: DC | PRN
  Administered 2016-08-08: 100 mg via INTRAVENOUS

## 2016-08-08 MED ORDER — ONDANSETRON HCL 4 MG/2ML IJ SOLN
INTRAMUSCULAR | Status: DC | PRN
  Administered 2016-08-08: 4 mg via INTRAVENOUS

## 2016-08-08 MED ORDER — ACETAMINOPHEN 325 MG PO TABS
650.0000 mg | ORAL_TABLET | ORAL | Status: DC | PRN
  Administered 2016-08-09: 650 mg via ORAL
  Filled 2016-08-08: qty 2

## 2016-08-08 MED ORDER — THROMBIN 5000 UNITS EX SOLR
CUTANEOUS | Status: AC
  Filled 2016-08-08: qty 5000

## 2016-08-08 MED ORDER — SODIUM CHLORIDE 0.9 % IR SOLN
Status: DC | PRN
  Administered 2016-08-08: 250 mL

## 2016-08-08 MED ORDER — SODIUM CHLORIDE 0.9 % IV SOLN
INTRAVENOUS | Status: DC | PRN
  Administered 2016-08-08 (×2): 10 ug/kg/min via INTRAVENOUS

## 2016-08-08 MED ORDER — SCOPOLAMINE 1 MG/3DAYS TD PT72
1.0000 | MEDICATED_PATCH | TRANSDERMAL | Status: DC
  Administered 2016-08-08: 1.5 mg via TRANSDERMAL
  Filled 2016-08-08: qty 1

## 2016-08-08 MED ORDER — FENTANYL CITRATE (PF) 100 MCG/2ML IJ SOLN
INTRAMUSCULAR | Status: DC | PRN
  Administered 2016-08-08: 50 ug via INTRAVENOUS
  Administered 2016-08-08: 100 ug via INTRAVENOUS
  Administered 2016-08-08 (×3): 50 ug via INTRAVENOUS

## 2016-08-08 MED ORDER — 0.9 % SODIUM CHLORIDE (POUR BTL) OPTIME
TOPICAL | Status: DC | PRN
  Administered 2016-08-08: 1000 mL

## 2016-08-08 MED ORDER — ALBUTEROL SULFATE (2.5 MG/3ML) 0.083% IN NEBU: 3.0000 mL | INHALATION_SOLUTION | Freq: Four times a day (QID) | RESPIRATORY_TRACT | Status: DC | PRN

## 2016-08-08 MED ORDER — CEFAZOLIN SODIUM 1 G IJ SOLR
INTRAMUSCULAR | Status: AC
  Filled 2016-08-08: qty 20

## 2016-08-08 MED ORDER — SUGAMMADEX SODIUM 200 MG/2ML IV SOLN
INTRAVENOUS | Status: AC
  Filled 2016-08-08: qty 2

## 2016-08-08 MED ORDER — CHLORHEXIDINE GLUCONATE CLOTH 2 % EX PADS: 6.0000 | MEDICATED_PAD | Freq: Once | CUTANEOUS | Status: DC

## 2016-08-08 MED ORDER — LIDOCAINE-EPINEPHRINE (PF) 2 %-1:200000 IJ SOLN
INTRAMUSCULAR | Status: AC
  Filled 2016-08-08: qty 20

## 2016-08-08 MED ORDER — CEFAZOLIN SODIUM-DEXTROSE 2-4 GM/100ML-% IV SOLN
INTRAVENOUS | Status: AC
  Filled 2016-08-08: qty 100

## 2016-08-08 MED ORDER — MIDAZOLAM HCL 5 MG/5ML IJ SOLN
INTRAMUSCULAR | Status: DC | PRN
  Administered 2016-08-08: 2 mg via INTRAVENOUS

## 2016-08-08 MED ORDER — MENTHOL 3 MG MT LOZG: 1.0000 | LOZENGE | OROMUCOSAL | Status: DC | PRN

## 2016-08-08 MED ORDER — SODIUM CHLORIDE 0.9 % IV SOLN
0.1000 mg/kg/h | INTRAVENOUS | Status: DC
  Filled 2016-08-08: qty 2

## 2016-08-08 MED ORDER — HYDROMORPHONE HCL 1 MG/ML IJ SOLN
0.2500 mg | INTRAMUSCULAR | Status: DC | PRN
  Administered 2016-08-08 (×4): 0.5 mg via INTRAVENOUS

## 2016-08-08 MED ORDER — TOPIRAMATE 25 MG PO TABS
50.0000 mg | ORAL_TABLET | Freq: Every day | ORAL | Status: DC
  Administered 2016-08-09: 50 mg via ORAL
  Filled 2016-08-08 (×2): qty 2

## 2016-08-08 MED ORDER — ACETAMINOPHEN 650 MG RE SUPP: 650.0000 mg | RECTAL | Status: DC | PRN

## 2016-08-08 MED ORDER — PREDNISOLONE ACETATE 1 % OP SUSP
1.0000 [drp] | Freq: Four times a day (QID) | OPHTHALMIC | Status: DC
  Administered 2016-08-09 – 2016-08-10 (×5): 1 [drp] via OPHTHALMIC
  Filled 2016-08-08: qty 1

## 2016-08-08 MED ORDER — MIDAZOLAM HCL 2 MG/2ML IJ SOLN
INTRAMUSCULAR | Status: AC
  Filled 2016-08-08: qty 2

## 2016-08-08 MED ORDER — SODIUM CHLORIDE 0.9% FLUSH: 3.0000 mL | INTRAVENOUS | Status: DC | PRN

## 2016-08-08 MED ORDER — BISACODYL 10 MG RE SUPP
10.0000 mg | Freq: Every day | RECTAL | Status: DC | PRN
Start: 2016-08-08 — End: 2016-08-10

## 2016-08-08 MED ORDER — THROMBIN 20000 UNITS EX SOLR
CUTANEOUS | Status: DC | PRN
  Administered 2016-08-08 (×2): 20 mL

## 2016-08-08 MED ORDER — HYDROMORPHONE HCL 1 MG/ML IJ SOLN
INTRAMUSCULAR | Status: AC
  Filled 2016-08-08: qty 0.5

## 2016-08-08 MED ORDER — OXYCODONE-ACETAMINOPHEN 10-325 MG PO TABS: 1.0000 | ORAL_TABLET | ORAL | Status: DC | PRN

## 2016-08-08 MED ORDER — DIAZEPAM 5 MG PO TABS
5.0000 mg | ORAL_TABLET | Freq: Three times a day (TID) | ORAL | Status: DC | PRN
Start: 2016-08-08 — End: 2016-08-10
  Administered 2016-08-08: 5 mg via ORAL

## 2016-08-08 MED ORDER — GLYCOPYRROLATE 0.2 MG/ML IJ SOLN
INTRAMUSCULAR | Status: DC | PRN
  Administered 2016-08-08: 0.2 mg via INTRAVENOUS

## 2016-08-08 MED ORDER — LIDOCAINE-EPINEPHRINE (PF) 2 %-1:200000 IJ SOLN
INTRAMUSCULAR | Status: DC | PRN
  Administered 2016-08-08: 20 mL

## 2016-08-08 MED ORDER — CEFAZOLIN SODIUM-DEXTROSE 2-4 GM/100ML-% IV SOLN
2.0000 g | INTRAVENOUS | Status: AC
  Administered 2016-08-08: 2 g via INTRAVENOUS

## 2016-08-08 MED ORDER — PROPOFOL 10 MG/ML IV BOLUS
INTRAVENOUS | Status: AC
  Filled 2016-08-08: qty 20

## 2016-08-08 MED ORDER — BUPIVACAINE LIPOSOME 1.3 % IJ SUSP
20.0000 mL | INTRAMUSCULAR | Status: AC
  Administered 2016-08-08: 20 mL
  Filled 2016-08-08: qty 20

## 2016-08-08 MED ORDER — HYDROMORPHONE HCL 1 MG/ML IJ SOLN
0.5000 mg | INTRAMUSCULAR | Status: DC | PRN
  Administered 2016-08-08 – 2016-08-09 (×2): 1 mg via INTRAVENOUS
  Administered 2016-08-09: 0.5 mg via INTRAVENOUS
  Administered 2016-08-09 – 2016-08-10 (×6): 1 mg via INTRAVENOUS
  Filled 2016-08-08 (×9): qty 1

## 2016-08-08 MED ORDER — EPHEDRINE 5 MG/ML INJ
INTRAVENOUS | Status: AC
  Filled 2016-08-08: qty 20

## 2016-08-08 MED ORDER — EPHEDRINE SULFATE-NACL 50-0.9 MG/10ML-% IV SOSY
PREFILLED_SYRINGE | INTRAVENOUS | Status: DC | PRN
  Administered 2016-08-08: 15 mg via INTRAVENOUS
  Administered 2016-08-08 (×3): 10 mg via INTRAVENOUS
  Administered 2016-08-08: 15 mg via INTRAVENOUS
  Administered 2016-08-08: 10 mg via INTRAVENOUS

## 2016-08-08 MED ORDER — KETAMINE HCL 100 MG/ML IJ SOLN
INTRAMUSCULAR | Status: AC
  Filled 2016-08-08: qty 1

## 2016-08-08 MED ORDER — SODIUM CHLORIDE 0.9 % IV SOLN: 250.0000 mL | INTRAVENOUS | Status: DC

## 2016-08-08 MED ORDER — THROMBIN 20000 UNITS EX SOLR
CUTANEOUS | Status: AC
  Filled 2016-08-08: qty 20000

## 2016-08-08 MED ORDER — PROPOFOL 10 MG/ML IV BOLUS
INTRAVENOUS | Status: DC | PRN
  Administered 2016-08-08: 200 mg via INTRAVENOUS

## 2016-08-08 MED ORDER — DIAZEPAM 5 MG PO TABS
ORAL_TABLET | ORAL | Status: AC
  Filled 2016-08-08: qty 1

## 2016-08-08 MED ORDER — ROCURONIUM BROMIDE 50 MG/5ML IV SOSY
PREFILLED_SYRINGE | INTRAVENOUS | Status: AC
  Filled 2016-08-08: qty 20

## 2016-08-08 MED ORDER — OXYCODONE-ACETAMINOPHEN 5-325 MG PO TABS
1.0000 | ORAL_TABLET | ORAL | Status: DC | PRN
  Administered 2016-08-09 – 2016-08-10 (×4): 2 via ORAL
  Filled 2016-08-08 (×4): qty 2

## 2016-08-08 MED ORDER — ROCURONIUM BROMIDE 100 MG/10ML IV SOLN
INTRAVENOUS | Status: DC | PRN
  Administered 2016-08-08: 20 mg via INTRAVENOUS
  Administered 2016-08-08: 50 mg via INTRAVENOUS
  Administered 2016-08-08: 30 mg via INTRAVENOUS

## 2016-08-08 MED ORDER — HYDROCODONE-ACETAMINOPHEN 5-325 MG PO TABS
1.0000 | ORAL_TABLET | ORAL | Status: DC | PRN
  Administered 2016-08-09: 2 via ORAL
  Filled 2016-08-08: qty 2

## 2016-08-08 MED ORDER — VANCOMYCIN HCL 1000 MG IV SOLR
INTRAVENOUS | Status: DC | PRN
  Administered 2016-08-08: 1000 mg via TOPICAL

## 2016-08-08 MED ORDER — ONDANSETRON HCL 4 MG/2ML IJ SOLN: 4.0000 mg | INTRAMUSCULAR | Status: DC | PRN

## 2016-08-08 MED ORDER — PHENOL 1.4 % MT LIQD: 1.0000 | OROMUCOSAL | Status: DC | PRN

## 2016-08-08 MED ORDER — ALBUMIN HUMAN 5 % IV SOLN
INTRAVENOUS | Status: DC | PRN
  Administered 2016-08-08: 14:00:00 via INTRAVENOUS

## 2016-08-08 MED ORDER — ZOLPIDEM TARTRATE 5 MG PO TABS: 5.0000 mg | ORAL_TABLET | Freq: Every evening | ORAL | Status: DC | PRN

## 2016-08-08 MED ORDER — FENTANYL CITRATE (PF) 100 MCG/2ML IJ SOLN
INTRAMUSCULAR | Status: AC
  Filled 2016-08-08: qty 4

## 2016-08-08 MED ORDER — FENTANYL CITRATE (PF) 100 MCG/2ML IJ SOLN
INTRAMUSCULAR | Status: AC
  Filled 2016-08-08: qty 2

## 2016-08-08 MED ORDER — VANCOMYCIN HCL 1000 MG IV SOLR
INTRAVENOUS | Status: AC
  Filled 2016-08-08: qty 1000

## 2016-08-08 MED ORDER — MIRABEGRON ER 50 MG PO TB24
50.0000 mg | ORAL_TABLET | Freq: Every day | ORAL | Status: DC
  Administered 2016-08-09 – 2016-08-10 (×2): 50 mg via ORAL
  Filled 2016-08-08 (×2): qty 1

## 2016-08-08 MED ORDER — GABAPENTIN 600 MG PO TABS
600.0000 mg | ORAL_TABLET | Freq: Four times a day (QID) | ORAL | Status: DC
  Administered 2016-08-08 – 2016-08-10 (×6): 600 mg via ORAL
  Filled 2016-08-08 (×6): qty 1

## 2016-08-08 MED ORDER — CYCLOBENZAPRINE HCL 10 MG PO TABS
10.0000 mg | ORAL_TABLET | Freq: Three times a day (TID) | ORAL | Status: DC | PRN
  Administered 2016-08-09: 10 mg via ORAL
  Filled 2016-08-08: qty 1

## 2016-08-08 MED ORDER — ESCITALOPRAM OXALATE 10 MG PO TABS
20.0000 mg | ORAL_TABLET | Freq: Every day | ORAL | Status: DC
  Administered 2016-08-09 – 2016-08-10 (×2): 20 mg via ORAL
  Filled 2016-08-08 (×2): qty 2

## 2016-08-08 MED ORDER — CEFAZOLIN SODIUM-DEXTROSE 2-4 GM/100ML-% IV SOLN
2.0000 g | Freq: Three times a day (TID) | INTRAVENOUS | Status: AC
  Administered 2016-08-09 (×2): 2 g via INTRAVENOUS
  Filled 2016-08-08 (×2): qty 100

## 2016-08-08 MED ORDER — LISDEXAMFETAMINE DIMESYLATE 70 MG PO CAPS
70.0000 mg | ORAL_CAPSULE | Freq: Every day | ORAL | Status: DC
Start: 2016-08-09 — End: 2016-08-10
  Administered 2016-08-09 – 2016-08-10 (×2): 70 mg via ORAL
  Filled 2016-08-08 (×2): qty 1

## 2016-08-08 MED ORDER — PROMETHAZINE HCL 25 MG/ML IJ SOLN: 6.2500 mg | INTRAMUSCULAR | Status: DC | PRN

## 2016-08-08 MED ORDER — SODIUM CHLORIDE 0.9 % IV SOLN
INTRAVENOUS | Status: DC | PRN
  Administered 2016-08-08: 12:00:00 via INTRAVENOUS

## 2016-08-08 MED ORDER — BACITRACIN ZINC 500 UNIT/GM EX OINT
TOPICAL_OINTMENT | CUTANEOUS | Status: AC
  Filled 2016-08-08: qty 28.35

## 2016-08-08 MED ORDER — MORPHINE SULFATE (PF) 2 MG/ML IV SOLN
1.0000 mg | INTRAVENOUS | Status: DC | PRN
  Administered 2016-08-08: 2 mg via INTRAVENOUS
  Filled 2016-08-08: qty 1

## 2016-08-08 MED ORDER — SUCCINYLCHOLINE CHLORIDE 200 MG/10ML IV SOSY
PREFILLED_SYRINGE | INTRAVENOUS | Status: AC
  Filled 2016-08-08: qty 10

## 2016-08-08 SURGICAL SUPPLY — 66 items
APL SKNCLS STERI-STRIP NONHPOA (GAUZE/BANDAGES/DRESSINGS) ×1
BAG DECANTER FOR FLEXI CONT (MISCELLANEOUS) ×3 IMPLANT
BASKET BONE COLLECTION (BASKET) ×2 IMPLANT
BENZOIN TINCTURE PRP APPL 2/3 (GAUZE/BANDAGES/DRESSINGS) ×3 IMPLANT
BLADE CLIPPER SURG (BLADE) IMPLANT
BUR MATCHSTICK NEURO 3.0 LAGG (BURR) ×3 IMPLANT
BUR PRECISION FLUTE 6.0 (BURR) ×3 IMPLANT
CANISTER SUCT 3000ML PPV (MISCELLANEOUS) ×3 IMPLANT
CARTRIDGE OIL MAESTRO DRILL (MISCELLANEOUS) ×1 IMPLANT
CLOSURE WOUND 1/2 X4 (GAUZE/BANDAGES/DRESSINGS) ×1
CONT SPEC 4OZ CLIKSEAL STRL BL (MISCELLANEOUS) ×3 IMPLANT
COVER BACK TABLE 60X90IN (DRAPES) ×3 IMPLANT
COVER TABLE BACK 60X90 (DRAPES) ×3 IMPLANT
DIFFUSER DRILL AIR PNEUMATIC (MISCELLANEOUS) ×3 IMPLANT
DRAPE C-ARM 42X72 X-RAY (DRAPES) ×6 IMPLANT
DRAPE HALF SHEET 40X57 (DRAPES) ×5 IMPLANT
DRAPE LAPAROTOMY 100X72X124 (DRAPES) ×3 IMPLANT
DRAPE POUCH INSTRU U-SHP 10X18 (DRAPES) ×3 IMPLANT
DRAPE SURG 17X23 STRL (DRAPES) ×12 IMPLANT
ELECT BLADE 4.0 EZ CLEAN MEGAD (MISCELLANEOUS) ×3
ELECT REM PT RETURN 9FT ADLT (ELECTROSURGICAL) ×3
ELECTRODE BLDE 4.0 EZ CLN MEGD (MISCELLANEOUS) ×1 IMPLANT
ELECTRODE REM PT RTRN 9FT ADLT (ELECTROSURGICAL) ×1 IMPLANT
GAUZE SPONGE 4X4 12PLY STRL (GAUZE/BANDAGES/DRESSINGS) ×3 IMPLANT
GAUZE SPONGE 4X4 16PLY XRAY LF (GAUZE/BANDAGES/DRESSINGS) ×5 IMPLANT
GLOVE BIO SURGEON STRL SZ8 (GLOVE) ×6 IMPLANT
GLOVE BIO SURGEON STRL SZ8.5 (GLOVE) ×6 IMPLANT
GLOVE EXAM NITRILE LRG STRL (GLOVE) IMPLANT
GLOVE EXAM NITRILE XL STR (GLOVE) IMPLANT
GLOVE EXAM NITRILE XS STR PU (GLOVE) IMPLANT
GOWN STRL REUS W/ TWL LRG LVL3 (GOWN DISPOSABLE) IMPLANT
GOWN STRL REUS W/ TWL XL LVL3 (GOWN DISPOSABLE) ×2 IMPLANT
GOWN STRL REUS W/TWL 2XL LVL3 (GOWN DISPOSABLE) IMPLANT
GOWN STRL REUS W/TWL LRG LVL3 (GOWN DISPOSABLE)
GOWN STRL REUS W/TWL XL LVL3 (GOWN DISPOSABLE) ×6
KIT BASIN OR (CUSTOM PROCEDURE TRAY) ×3 IMPLANT
KIT INFUSE SMALL (Orthopedic Implant) ×2 IMPLANT
KIT ROOM TURNOVER OR (KITS) ×3 IMPLANT
NDL HYPO 21X1.5 SAFETY (NEEDLE) IMPLANT
NEEDLE HYPO 21X1.5 SAFETY (NEEDLE) IMPLANT
NEEDLE HYPO 22GX1.5 SAFETY (NEEDLE) ×3 IMPLANT
NS IRRIG 1000ML POUR BTL (IV SOLUTION) ×3 IMPLANT
OIL CARTRIDGE MAESTRO DRILL (MISCELLANEOUS) ×3
PACK LAMINECTOMY NEURO (CUSTOM PROCEDURE TRAY) ×3 IMPLANT
PAD ARMBOARD 7.5X6 YLW CONV (MISCELLANEOUS) ×9 IMPLANT
PATTIES SURGICAL .5 X1 (DISPOSABLE) IMPLANT
PATTIES SURGICAL 1X1 (DISPOSABLE) ×2 IMPLANT
ROD PERC 100MM (Rod) ×4 IMPLANT
SCREW PATHFINDER 7.5X45 (Screw) ×4 IMPLANT
SCREW PATHFINDER 7.5X50 (Screw) ×4 IMPLANT
SPACER ALTERA 10X31 8-12MM-8 (Spacer) ×4 IMPLANT
SPONGE LAP 4X18 X RAY DECT (DISPOSABLE) IMPLANT
SPONGE NEURO XRAY DETECT 1X3 (DISPOSABLE) IMPLANT
SPONGE SURGIFOAM ABS GEL 100 (HEMOSTASIS) ×5 IMPLANT
STRIP BIOACTIVE 20CC 25X100X8 (Miscellaneous) ×4 IMPLANT
STRIP CLOSURE SKIN 1/2X4 (GAUZE/BANDAGES/DRESSINGS) ×2 IMPLANT
SUT VIC AB 1 CT1 18XBRD ANBCTR (SUTURE) ×2 IMPLANT
SUT VIC AB 1 CT1 8-18 (SUTURE) ×6
SUT VIC AB 2-0 CP2 18 (SUTURE) ×6 IMPLANT
SYRINGE 20CC LL (MISCELLANEOUS) ×2 IMPLANT
TAPE CLOTH SURG 6X10 WHT LF (GAUZE/BANDAGES/DRESSINGS) ×2 IMPLANT
TOP CLSR SEQUOIA (Orthopedic Implant) ×16 IMPLANT
TOWEL OR 17X24 6PK STRL BLUE (TOWEL DISPOSABLE) ×3 IMPLANT
TOWEL OR 17X26 10 PK STRL BLUE (TOWEL DISPOSABLE) ×3 IMPLANT
TRAY FOLEY W/METER SILVER 16FR (SET/KITS/TRAYS/PACK) ×3 IMPLANT
WATER STERILE IRR 1000ML POUR (IV SOLUTION) ×3 IMPLANT

## 2016-08-08 NOTE — H&P (Signed)
Subjective: The patient is a 51 year old white female who's had a previous L3-4 decompression, instrumentation, and fusion by another physician years ago. She has developed recurrent back, buttock, and leg pain consistent with neurogenic claudication. She was worked up with a lumbar myelo CT which demonstrated the patient had an L2-3 retrolisthesis with disc degeneration at L2-3 and L4-5. I discussed the situation with the patient. We discussed the various treatment options. She has decided to proceed with surgery.   Past Medical History:  Diagnosis Date  . ADHD (attention deficit hyperactivity disorder)   . Anxiety   . Arthritis    disk disease  . Barrett esophagus   . Chronic pain   . COPD (chronic obstructive pulmonary disease) ([place])   . Depression   . Dyspnea   . Fibromyalgia   . GERD (gastroesophageal reflux disease)    IBS  . Headache   . Hot flashes 04/11/2015  . IBS (irritable bowel syndrome)   . Obesity   . Peripheral arterial disease ([place])   . Plantar fasciitis of left foot   . PTSD (post-traumatic stress disorder)   . RLQ abdominal pain 04/11/2015  . Vocal cord cancer [facility])    surgery corrected the problem     Past Surgical History:  Procedure Laterality Date  . ANTERIOR CERVICAL DECOMP/DISCECTOMY FUSION  08/18/2012   Procedure: ANTERIOR CERVICAL DECOMPRESSION/DISCECTOMY FUSION 2 LEVELS;  Surgeon: Faythe Ghee, MD;  Location: [place] NEURO ORS;  Service: Neurosurgery;  Laterality: N/A;  anterior cervical five-six,six-seven decompression fusion plating  . ANTERIOR LAT LUMBAR FUSION  03/19/2012   Procedure: ANTERIOR LATERAL LUMBAR FUSION 1 LEVEL;  Surgeon: Faythe Ghee, MD;  Location: [place] NEURO ORS;  Service: Neurosurgery;  Laterality: Right;  Anterolateral Lumbar Interbody Fusion, Lumbar Three-Four (Hand broke through glove and nat in the room)  . BACK SURGERY    . BUNIONECTOMY Left 08/04/2014   Procedure: LEFT FOOT LAPIDUS BUNION CORRECTION, LEFT SECOND TARSALMETATARSAL  JOINT DEBRIDEMENT AND LEFT MODIFIED MCBRIDE BUNIONECTOMY;  Surgeon: [name], MD;  Location: [place];  Service: Orthopedics;  Laterality: Left;  . CARPAL TUNNEL RELEASE Bilateral   . CLEFT PALATE REPAIR    . COLONOSCOPY N/A 02/16/2015   Procedure: COLONOSCOPY;  Surgeon: Rogene Houston, MD;  [facility];  Service: Endoscopy;  Laterality: N/A;  155  . ENDOMETRIAL ABLATION    . ESOPHAGOGASTRODUODENOSCOPY N/A 02/16/2015   Procedure: ESOPHAGOGASTRODUODENOSCOPY (EGD);  Surgeon: Rogene Houston, MD;  [facility];  Service: Endoscopy;  Laterality: N/A;  . LAPAROSCOPIC TUBAL LIGATION     [facility]  . NOSE SURGERY    . SHOULDER OPEN ROTATOR CUFF REPAIR Right 04/02/2013   Procedure: ROTATOR CUFF REPAIR SHOULDER OPEN;  Surgeon: Carole Civil, MD;  [facility];  Service: Orthopedics;  Laterality: Right;  . THROAT SURGERY  2000   for throat cancer/no problems intubation...[facility]  . TONSILLECTOMY    . TUBAL LIGATION      Allergies  Allergen Reactions  . Bee Venom Anaphylaxis  . Adhesive [Tape] Itching    Some types of adhesive tape and paper tape rip up the skin.      Social History  Substance Use Topics  . Smoking status: Current Every Day Smoker    Packs/day: 0.50    Years: 30.00    Types: Cigarettes  . Smokeless tobacco: Never Used  . Alcohol use No    Family History  Problem Relation Age of Onset  . Diabetes Mother   .  Hypertension Mother   . Heart disease Maternal Grandfather     sudden death  . Cancer Paternal Grandmother   . Cancer Paternal Grandfather   . Other Daughter     allergies; scolosis  . Cancer Paternal Uncle   . Cancer Paternal Uncle   . Cancer Paternal Uncle   . Cancer Paternal Uncle    Prior to Admission medications   Medication Sig Start Date End Date Taking? Authorizing Provider  albuterol (PROVENTIL HFA;VENTOLIN HFA) 108 (90 Base) MCG/ACT inhaler Inhale 2 puffs into the lungs every 6 (six)  hours as needed for wheezing or shortness of breath.   Yes Historical Provider, MD  Aspirin-Salicylamide-Caffeine (BC HEADACHE POWDER PO) Take 1 packet by mouth daily as needed (headaches).   Yes Historical Provider, MD  Biotin 10000 MCG TABS Take 10,000 mcg by mouth daily.   Yes Historical Provider, MD  diazepam (VALIUM) 5 MG tablet Take 5 mg by mouth 3 (three) times daily as needed for muscle spasms.   Yes Historical Provider, MD  diclofenac (CATAFLAM) 50 MG tablet Take 1 tablet (50 mg total) by mouth 2 (two) times daily. 07/30/16  Yes Carole Civil, MD  diclofenac sodium (VOLTAREN) 1 % GEL Apply 2 g topically 4 (four) times daily as needed (pain).   Yes Historical Provider, MD  docusate sodium (COLACE) 250 MG capsule Take 250 mg by mouth at bedtime.   Yes Historical Provider, MD  EPINEPHrine 0.3 mg/0.3 mL IJ SOAJ injection Inject 0.3 mg into the muscle daily as needed (allergic reaction).    Yes Historical Provider, MD  escitalopram (LEXAPRO) 20 MG tablet Take 20 mg by mouth daily before breakfast.    Yes Historical Provider, MD  gabapentin (NEURONTIN) 600 MG tablet Take 600 mg by mouth 4 (four) times daily.   Yes Historical Provider, MD  lisdexamfetamine (VYVANSE) 70 MG capsule Take 70 mg by mouth daily.   Yes Historical Provider, MD  Magnesium Oxide (PHILLIPS) 500 MG (LAX) TABS Take 500 mg by mouth at bedtime.   Yes Historical Provider, MD  mirabegron ER (MYRBETRIQ) 50 MG TB24 tablet Take 50 mg by mouth daily.   Yes Historical Provider, MD  Multiple Minerals-Vitamins (CALCIUM-MAGNESIUM-ZINC-D3) TABS Take 1 tablet by mouth 3 (three) times daily.   Yes Historical Provider, MD  oxyCODONE-acetaminophen (PERCOCET) 10-325 MG tablet Take 1 tablet by mouth every 4 (four) hours as needed for pain.    Yes Historical Provider, MD  oxyCODONE-acetaminophen (PERCOCET) 10-325 MG tablet Take 1 tablet by mouth every 4 (four) hours as needed for pain.   Yes Historical Provider, MD  pantoprazole (PROTONIX) 40  MG tablet Take 1 tablet (40 mg total) by mouth 2 (two) times daily. 11/09/14  Yes Butch Penny, NP  prednisoLONE acetate (PRED FORTE) 1 % ophthalmic suspension Place 1 drop into both eyes 4 (four) times daily.   Yes Historical Provider, MD  topiramate (TOPAMAX) 50 MG tablet Take 50 mg by mouth at bedtime.    Yes Historical Provider, MD  varenicline (CHANTIX) 1 MG tablet Take 1 mg by mouth 2 (two) times daily.   Yes Historical Provider, MD     Review of Systems  Positive ROS: As above  All other systems have been reviewed and were otherwise negative with the exception of those mentioned in the HPI and as above.  Objective: Vital signs in last 24 hours: Temp:  [98.6 F (37 C)] 98.6 F (37 C) (01/11 0859) Pulse Rate:  [72] 72 (01/11 0859) Resp:  [  20] 20 (01/11 0859) BP: (133)/(71) 133/71 (01/11 0859) SpO2:  [99 %] 99 % (01/11 0859)  General Appearance: Alert Head: Normocephalic, without obvious abnormality, atraumatic Eyes: PERRL, conjunctiva/corneas clear, EOM's intact,    Ears: Normal  Throat: Normal  Neck: Supple, her cervical incision is well-healed. Back: unremarkable Lungs: Clear to auscultation bilaterally, respirations unlabored Heart: Regular rate and rhythm, no murmur, rub or gallop Abdomen: Soft, non-tender Extremities: Extremities normal, atraumatic, no cyanosis or edema Skin: unremarkable  NEUROLOGIC:   Mental status: alert and oriented,Motor Exam - grossly normal Sensory Exam - grossly normal Reflexes:  Coordination - grossly normal Gait - grossly normal Balance - grossly normal Cranial Nerves: I: smell Not tested  II: visual acuity  OS: Normal  OD: Normal   II: visual fields Full to confrontation  II: pupils Equal, round, reactive to light  III,VII: ptosis None  III,IV,VI: extraocular muscles  Full ROM  V: mastication Normal  V: facial light touch sensation  Normal  V,VII: corneal reflex  Present  VII: facial muscle function - upper  Normal  VII:  facial muscle function - lower Normal  VIII: hearing Not tested  IX: soft palate elevation  Normal  IX,X: gag reflex Present  XI: trapezius strength  5/5  XI: sternocleidomastoid strength 5/5  XI: neck flexion strength  5/5  XII: tongue strength  Normal    Data Review Lab Results  Component Value Date   WBC 8.6 07/30/2016   HGB 14.0 07/30/2016   HCT 43.7 07/30/2016   MCV 96.0 07/30/2016   PLT 308 07/30/2016   Lab Results  Component Value Date   NA 139 07/30/2016   K 4.0 07/30/2016   CL 109 07/30/2016   CO2 22 07/30/2016   BUN 13 07/30/2016   CREATININE 0.81 07/30/2016   GLUCOSE 98 07/30/2016   No results found for: INR, PROTIME  Assessment/Plan: 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 We have discussed the various treatment options including surgery. I have described the surgical treatment option of an exploration of her L3-4 fusion, possible removal of the old screws, and an L2-3 and L4-5 decompression, instrumentation, and fusion. I have shown her surgical models. We have discussed the risks, benefits, alternatives, expected postoperative course, and likelihood of achieving her goals with surgery. I have answered all the patient's questions. She has decided to proceed with surgery.   Debhora Titus D 08/08/2016 10:43 AM

## 2016-08-08 NOTE — Progress Notes (Signed)
Orthopedic Tech Progress Note Patient Details:  [name] 1966-03-27 [id number] Patient already has brace. Patient ID: [name], female   DOB: January 05, 1966, 51 y.o.   MRN: [id number]   Braulio Bosch 08/08/2016, 9:51 PM

## 2016-08-08 NOTE — Progress Notes (Signed)
RN administered 2 mg morphine IV. Patient c/o h/a. Patient states morphine is not effective and causes horrible h/a. Patient moaning, grimacing, yelling out in pain. MD paged. RN will continue to monitor.

## 2016-08-08 NOTE — Progress Notes (Signed)
0.5mg -1 mg Dilaudid IV q 3 hrs PRN for severe pain entered per Dr. Sherley Bounds.

## 2016-08-08 NOTE — Transfer of Care (Signed)
Immediate Anesthesia Transfer of Care Note  Patient: [name]  Procedure(s) Performed: Procedure(s) with comments: POSTERIOR LUMBAR INTERBODY FUSION, INTERBODY PROSTHESIS,POSTERIOR SEGMENTAL INSTRUMENTATION, POSTERIOR LATERAL ARTHRODESIS LUMBAR TWO- LUMBAR THREE, LUMBAR FOUR- LUMBAR FIVE (N/A) - POSTERIOR LUMBAR INTERBODY FUSION, INTERBODY PROSTHESIS,POSTERIOR SEGMENTAL INSTRUMENTATION, POSTERIOR LATERAL ARTHRODESIS L2-L3, L4-L5  Patient Location: PACU  Anesthesia Type:General  Level of Consciousness: awake, alert  and patient cooperative  Airway & Oxygen Therapy: Patient Spontanous Breathing and Patient connected to nasal cannula oxygen  Post-op Assessment: Report given to RN and Post -op Vital signs reviewed and stable  Post vital signs: Reviewed and stable  Last Vitals:  Vitals:   08/08/16 0859  BP: 133/71  Pulse: 72  Resp: 20  Temp: 37 C    Last Pain:  Vitals:   08/08/16 0859  TempSrc: Oral         Complications: No apparent anesthesia complications

## 2016-08-08 NOTE — Op Note (Signed)
Brief history: The patient is a 51 year old white female who has had a prior lumbar fusion at L3-4 by another physician. She complains of back and leg pain. She has failed medical management. She was worked up with a lumbar myelo CT which demonstrated L2-3 and L4-5 degenerative disc disease, spondylolisthesis, etc. I discussed the situation with the patient. We discussed the various treatment options. She has decided to proceed with surgery after weighing the risks, benefits, and alternatives.  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 Etc. requiring a wide decompression of the nerve roots.); L2-3 and L4-5 transforaminal lumbar interbody fusion with local morselized autograft bone and Kinnex graft extender; insertion of interbody prosthesis at L2-3 and L4-5 (globus peek expandable interbody prosthesis); posterior segmental instrumentation from L2 to L5 with Zimmer titanium pedicle screws and rods; posterior lateral arthrodesis at L2-3, L3-4 and L4-5 with local morselized autograft bone, bone morphogenic protein-soaked collagen sponges and Kinnex bone graft extender; exploration of lumbar fusion/removal of old lumbar hardware  Surgeon: Dr. Earle Gell  Asst.: Dr. Sherley Bounds  Anesthesia: Gen. endotracheal  Estimated blood loss: 250 mL  Drains: None  Complications: None  Description of procedure: The patient was brought to the operating room by the anesthesia team. General endotracheal anesthesia was induced. The patient was turned to the prone position on the Wilson  frame. The patient's lumbosacral region was then prepared with Betadine scrub and Betadine solution. Sterile drapes were applied.  I then injected the area to be incised with Marcaine with epinephrine solution. I then used the scalpel to make a linear midline incision over the L2-3, L3-4 and L4-5 interspace. I then used electrocautery to perform a bilateral subperiosteal dissection exposing the spinous process and lamina of L2, L3, L4 and L5, and to expose the old hardware. We then obtained intraoperative radiograph to confirm our location. We then inserted the Verstrac retractor to provide exposure.  I began the exploration of the fusion by removing the caps from the old screw heads at L3 and L4. I then removed the rods. I inspected the facets. She did not appear to have a solid fusion.  I began the decompression by using the high speed drill to perform laminotomies at L2-3 and L4-5 bilaterally. We then used the Kerrison punches to widen the laminotomy and removed the ligamentum flavum at L2-3 and L4-5 bilaterally. We used the Kerrison punches to remove the medial facets at L2-3 and L4-5 bilaterally. We performed wide foraminotomies about the bilateral L2, L3, L4 and L5 nerve roots completing the decompression.  We now turned our attention to the posterior lumbar interbody fusion. I used a scalpel to incise the intervertebral disc at L2-3 and L4-5 bilaterally. I then performed a partial intervertebral discectomy at L2-3 and L4-5 bilaterally using the pituitary forceps. We prepared the vertebral endplates at [id number] and [id number] bilaterally for the fusion by removing the soft tissues with the curettes. We then used the trial spacers to pick the appropriate sized interbody prosthesis. We prefilled his prosthesis with a combination of local morselized autograft bone that we obtained during the decompression as well as Kinnex bone graft extender. We inserted the  prefilled prosthesis into the interspace at L2-3 and  L4-5 from the left, we then expanded the prosthesis.. There was a good snug fit of the prosthesis in the interspace. We then filled and the remainder of the intervertebral disc space with local morselized autograft bone and Kinnex. This completed the posterior lumbar interbody arthrodesis.  We now turned attention to the instrumentation. Under fluoroscopic guidance we cannulated the bilateral L2 and L5 pedicles with the bone probe. We then removed the bone probe. We then tapped the pedicle with a 7.0 millimeter tap. We then removed the tap. We probed inside the tapped pedicle with a ball probe to rule out cortical breaches. We then inserted a 7.5 x 50 and 45 millimeter pedicle screw into the L2 and L5 pedicles bilaterally under fluoroscopic guidance. We then palpated along the medial aspect of the pedicles to rule out cortical breaches. There were none. The nerve roots were not injured. We then connected the unilateral pedicle screws from L2-L5 with a lordotic rod. We compressed the construct and secured the rod in place with the caps. We then tightened the caps appropriately. This completed the instrumentation from L2-L5 bilaterally.  We now turned our attention to the posterior lateral arthrodesis at L2-3, L3-4 and L4-5 bilaterally. We used the high-speed drill to decorticate the remainder of the facets, pars, transverse process at L2-3, L3-4 and L4-5 bilaterally. We placed bone morphogenic protein-soaked collagen sponges over these decorticated structures bilaterally We then applied a combination of local morselized autograft bone and Kinnex bone graft extender over these decorticated posterior lateral structures. This completed the posterior lateral arthrodesis.  We then obtained hemostasis using bipolar electrocautery. We irrigated the wound out with bacitracin solution. We inspected the thecal sac and nerve roots and noted they were well decompressed. We then removed the retractor. We placed vancomycin  powder in the wound. We reapproximated patient's thoracolumbar fascia with interrupted #1 Vicryl suture. We reapproximated patient's subcutaneous tissue with interrupted 2-0 Vicryl suture. The reapproximated patient's skin with Steri-Strips and benzoin. The wound was then coated with bacitracin ointment. A sterile dressing was applied. The drapes were removed. The patient was subsequently returned to the supine position where they were extubated by the anesthesia team. He was then transported to the post anesthesia care unit in stable condition. All sponge instrument and needle counts were reportedly correct at the end of this case.

## 2016-08-08 NOTE — Anesthesia Procedure Notes (Addendum)
Procedure Name: Intubation Date/Time: 08/08/2016 11:56 AM Performed by: Scheryl Darter Pre-anesthesia Checklist: Patient identified, Emergency Drugs available, Suction available and Patient being monitored Patient Re-evaluated:Patient Re-evaluated prior to inductionOxygen Delivery Method: Circle System Utilized Preoxygenation: Pre-oxygenation with 100% oxygen Intubation Type: IV induction Ventilation: Mask ventilation without difficulty Grade View: Grade I Tube type: Oral Tube size: 7.5 mm Number of attempts: 1 Airway Equipment and Method: Stylet Placement Confirmation: ETT inserted through vocal cords under direct vision,  positive ETCO2 and breath sounds checked- equal and bilateral Secured at: 22 cm Tube secured with: Tape Dental Injury: Teeth and Oropharynx as per pre-operative assessment

## 2016-08-08 NOTE — Anesthesia Preprocedure Evaluation (Signed)
Anesthesia Evaluation  Patient identified by MRN, date of birth, ID band Patient awake    Reviewed: Allergy & Precautions, H&P , NPO status , Patient's Chart, lab work & pertinent test results  Airway Mallampati: I  TM Distance: >3 FB Neck ROM: Full    Dental no notable dental hx. (+) Teeth Intact, Dental Advisory Given   Pulmonary COPD, Current Smoker, former smoker,    Pulmonary exam normal breath sounds clear to auscultation       Cardiovascular + Peripheral Vascular Disease  negative cardio ROS   Rhythm:Regular Rate:Normal     Neuro/Psych PSYCHIATRIC DISORDERS Anxiety Depression negative neurological ROS     GI/Hepatic Neg liver ROS, GERD  Medicated and Controlled,  Endo/Other  Morbid obesity  Renal/GU negative Renal ROS  negative genitourinary   Musculoskeletal  (+) Arthritis , Osteoarthritis,  Fibromyalgia -  Abdominal   Peds  Hematology negative hematology ROS (+)   Anesthesia Other Findings   Reproductive/Obstetrics negative OB ROS                             Anesthesia Physical  Anesthesia Plan  ASA: III  Anesthesia Plan: General   Post-op Pain Management:    Induction: Intravenous  Airway Management Planned: Oral ETT  Additional Equipment:   Intra-op Plan:   Post-operative Plan: Extubation in OR  Informed Consent: I have reviewed the patients History and Physical, chart, labs and discussed the procedure including the risks, benefits and alternatives for the proposed anesthesia with the patient or authorized representative who has indicated his/her understanding and acceptance.   Dental advisory given  Plan Discussed with: CRNA  Anesthesia Plan Comments:         Anesthesia Quick Evaluation

## 2016-08-09 ENCOUNTER — Encounter (HOSPITAL_COMMUNITY): Payer: Self-pay

## 2016-08-09 LAB — BASIC METABOLIC PANEL
Anion gap: 10 (ref 5–15)
BUN: 9 mg/dL (ref 6–20)
CALCIUM: 8.6 mg/dL — AB (ref 8.9–10.3)
CO2: 23 mmol/L (ref 22–32)
CREATININE: 0.72 mg/dL (ref 0.44–1.00)
Chloride: 109 mmol/L (ref 101–111)
GFR calc Af Amer: >60 mL/min (ref >60)
GFR calc non Af Amer: >60 mL/min (ref >60)
GLUCOSE: 113 mg/dL — AB (ref 65–99)
Potassium: 3.8 mmol/L (ref 3.5–5.1)
Sodium: 142 mmol/L (ref 135–145)

## 2016-08-09 LAB — CBC
HCT: 35.1 % — ABNORMAL LOW (ref 36.0–46.0)
Hemoglobin: 11.1 g/dL — ABNORMAL LOW (ref 12.0–15.0)
MCH: 30.2 pg (ref 26.0–34.0)
MCHC: 31.6 g/dL (ref 30.0–36.0)
MCV: 95.6 fL (ref 78.0–100.0)
Platelets: 285 10*3/uL (ref 150–400)
RBC: 3.67 MIL/uL — ABNORMAL LOW (ref 3.87–5.11)
RDW: 12.9 % (ref 11.5–15.5)
WBC: 11.4 10*3/uL — ABNORMAL HIGH (ref 4.0–10.5)

## 2016-08-09 MED ORDER — DEXAMETHASONE SODIUM PHOSPHATE 10 MG/ML IJ SOLN
10.0000 mg | Freq: Once | INTRAMUSCULAR | Status: AC
  Administered 2016-08-09: 10 mg via INTRAVENOUS
  Filled 2016-08-09: qty 1

## 2016-08-09 MED FILL — Heparin Sodium (Porcine) Inj 1000 Unit/ML: INTRAMUSCULAR | Qty: 30 | Status: AC

## 2016-08-09 MED FILL — Sodium Chloride IV Soln 0.9%: INTRAVENOUS | Qty: 1000 | Status: AC

## 2016-08-09 NOTE — Progress Notes (Signed)
Patient oral temp 100.6 0551 back pain 10/10 and h/a 4/10. Per patient h/a is "easing off". PO tylenol administered, see MAR, effective. Patient PO temp 98.7 at 0630. RN will continue to monitor.

## 2016-08-09 NOTE — Progress Notes (Signed)
Patient is stating she has a "severe headache" along with some blurry vision. Pt's strength is strong , and equal in all extremities, full sensation in all extremities, and does not complain of any numbness or tingling. MD was notified. MD order Decadron 10 mg/iv/stat/1x. Will continue to monitor.

## 2016-08-09 NOTE — Care Management Note (Signed)
Case Management Note  Patient Details  Name: [name] MRN: [id number] Date of Birth: 07/29/1966  Subjective/Objective:                Patient was admitted for lumbar surgery. Lives at home with family. CM will follow for discharge needs pending PT/OT evals and physician orders.     Action/Plan:   Expected Discharge Date:                  Expected Discharge Plan:     In-House Referral:     Discharge planning Services     Post Acute Care Choice:    Choice offered to:     DME Arranged:    DME Agency:     HH Arranged:    HH Agency:     Status of Service:     If discussed at [employer] of Stay Meetings, dates discussed:    Additional Comments:  Rolm Baptise, RN 08/09/2016, 9:25 AM

## 2016-08-09 NOTE — Evaluation (Signed)
Physical Therapy Evaluation Patient Details Name: [name] MRN: [id number] DOB: 1966/01/03 Today's Date: 08/09/2016   History of Present Illness  Patient is a 51 y/o female admitted with  L2-3 and L4-5 Degenerative disc disease, L2-3 spondylolisthesis; lumbago; lumbar radiculopathy, now s/p hardware removal L3-4, decompressive laminecomies bilateral L2, L3, L4 and L5; TLIF L2-3, L4-5 and PLA L2-3, L3-4, L4-5.  Clinical Impression  Patient presents with decreased mobility due to deficits listed in PT problem list.  She will benefit from skilled PT in the acute setting to allow return home with family support.  Likely not to need follow up PT.     Follow Up Recommendations No PT follow up    Equipment Recommendations  None recommended by PT    Recommendations for Other Services       Precautions / Restrictions Precautions Precautions: Back;Fall Required Braces or Orthoses: Spinal Brace Spinal Brace: Applied in sitting position;Lumbar corset (aspen brace)      Mobility  Bed Mobility Overal bed mobility: Needs Assistance Bed Mobility: Rolling;Sidelying to Sit Rolling: Supervision Sidelying to sit: Min guard       General bed mobility comments: cues for technique  Transfers Overall transfer level: Needs assistance Equipment used: Rolling walker (2 wheeled) Transfers: Sit to/from Stand Sit to Stand: Min assist         General transfer comment: for lifting, correct hand placement  Ambulation/Gait Ambulation/Gait assistance: Supervision;Min guard Ambulation Distance (Feet): 100 Feet Assistive device: Rolling walker (2 wheeled) Gait Pattern/deviations: Step-through pattern;Decreased stride length;Trendelenburg     General Gait Details: evidence of bilateral hiip weakness R > L  Stairs            Wheelchair Mobility    Modified Rankin (Stroke Patients Only)       Balance Overall balance assessment: Needs assistance   Sitting balance-Leahy Scale:  Good       Standing balance-Leahy Scale: Fair Standing balance comment: can stand to don pants, etc                             Pertinent Vitals/Pain Pain Assessment: 0-10 Pain Score: 7  Pain Location: Back Pain Descriptors / Indicators: Discomfort;Sore Pain Intervention(s): Monitored during session;Repositioned;RN gave pain meds during session    [place] expects to be discharged to:: Private residence Living Arrangements: Parent;Non-relatives/Friends Available Help at Discharge: Family Type of Home: Mobile home Home Access: Stairs to enter Entrance Stairs-Rails: Can reach both Entrance Stairs-Number of Steps: 5 Home Layout: One level Home Equipment: Walker - 2 wheels;Grab bars - toilet;Grab bars - tub/shower;Shower seat;Cane - single point      Prior Function Level of Independence: Independent               Hand Dominance        Extremity/Trunk Assessment   Upper Extremity Assessment Upper Extremity Assessment: Overall WFL for tasks assessed    Lower Extremity Assessment Lower Extremity Assessment: Generalized weakness       Communication   Communication: No difficulties  Cognition Arousal/Alertness: Awake/alert Behavior During Therapy: WFL for tasks assessed/performed Overall Cognitive Status: Within Functional Limits for tasks assessed                      General Comments      Exercises     Assessment/Plan    PT Assessment Patient needs continued PT services  PT Problem List Decreased strength;Decreased mobility;Decreased balance;Decreased knowledge of  use of DME;Pain;Decreased knowledge of precautions          PT Treatment Interventions DME instruction;Gait training;Functional mobility training;Stair training;Balance training;Therapeutic exercise;Patient/family education;Therapeutic activities    PT Goals (Current goals can be found in the Care Plan section)  Acute Rehab PT Goals Patient Stated  Goal: To return to independent PT Goal Formulation: With patient Time For Goal Achievement: 08/12/16 Potential to Achieve Goals: Good    Frequency Min 5X/week   Barriers to discharge        Co-evaluation               End of Session Equipment Utilized During Treatment: Back brace Activity Tolerance: Patient tolerated treatment well Patient left: in bed;with call bell/phone within reach           Time: 0927-0950 PT Time Calculation (min) (ACUTE ONLY): 23 min   Charges:   PT Evaluation $PT Eval Moderate Complexity: 1 Procedure PT Treatments $Gait Training: 8-22 mins   PT G CodesReginia Naas 08-17-2016, 10:24 AM  Magda Kiel, [place] 08-17-2016

## 2016-08-09 NOTE — Progress Notes (Signed)
Patient ID: [name], female   DOB: 07-May-1966, 51 y.o.   MRN: [id number] Subjective:  The patient is alert and pleasant. Her back is appropriately sore. She looks well.  Objective: Vital signs in last 24 hours: Temp:  [97.7 F (36.5 C)-100.6 F (38.1 C)] 98.7 F (37.1 C) (01/12 0630) Pulse Rate:  [68-82] 73 (01/12 0534) Resp:  [14-31] 20 (01/12 0534) BP: (103-145)/(57-86) 107/59 (01/12 0534) SpO2:  [94 %-100 %] 97 % (01/12 0534)  Intake/Output from previous day: 01/11 0701 - 01/12 0700 In: 3200 [P.O.:100; I.V.:2750; IV Piggyback:350] Out: 2140 [Urine:1790; Blood:350] Intake/Output this shift: No intake/output data recorded.  Physical exam the patient is alert and pleasant. Her strength is grossly normal in her lower extremities.  Lab Results:  Recent Labs  08/09/16 0240  WBC 11.4*  HGB 11.1*  HCT 35.1*  PLT 285   BMET  Recent Labs  08/09/16 0240  NA 142  K 3.8  CL 109  CO2 23  GLUCOSE 113*  BUN 9  CREATININE 0.72  CALCIUM 8.6*    Studies/Results: Dg Lumbar Spine 2-3 Views  Result Date: 08/08/2016 CLINICAL DATA:  Posterior lumbar interbody fusion. EXAM: LUMBAR SPINE - 2-3 VIEW; DG C-ARM 61-120 MIN fluoroscopic time 1 minutes and 23 seconds. COMPARISON:  None. FINDINGS: Patient status post posterior fusion of L2 through L5 with interbody prosthesis identified. There is no malalignment. IMPRESSION: Status post posterior fusion of L2 through L5 without malalignment. Electronically Signed   By: Abelardo Diesel M.D.   On: 08/08/2016 16:44   Dg Lumbar Spine 1 View  Result Date: 08/08/2016 CLINICAL DATA:  Posterior fusion. EXAM: LUMBAR SPINE - 1 VIEW COMPARISON:  No prior. FINDINGS: Lumbar vertebra difficult to number due to technique. Lumbar vertebra number with what appears to be the lowest segmented lumbar shaped vertebra as L5. L3-L4 posterior fusion. Metallic marker noted posteriorly at what appears to be the L3-4 level. IMPRESSION: Limited exam. Metallic  marker noted posteriorly at what appears to be the L3-L4 level. Electronically Signed   By: Marcello Moores  Register   On: 08/08/2016 12:45   Dg C-arm 1-60 Min  Result Date: 08/08/2016 CLINICAL DATA:  Posterior lumbar interbody fusion. EXAM: LUMBAR SPINE - 2-3 VIEW; DG C-ARM 61-120 MIN fluoroscopic time 1 minutes and 23 seconds. COMPARISON:  None. FINDINGS: Patient status post posterior fusion of L2 through L5 with interbody prosthesis identified. There is no malalignment. IMPRESSION: Status post posterior fusion of L2 through L5 without malalignment. Electronically Signed   By: Abelardo Diesel M.D.   On: 08/08/2016 16:44    Assessment/Plan: Postop day #1: The patient is doing well. We will mobilize her with PT. She will likely go home over the weekend.    LOS: 1 day     Surie Suchocki D 08/09/2016, 7:38 AM

## 2016-08-09 NOTE — Progress Notes (Signed)
RN placed order for regular diet. Service response contacted and house tray ordered for patient. Patient informed oncoming RN informed.

## 2016-08-10 MED ORDER — HYDROMORPHONE HCL 2 MG PO TABS: 2.0000 mg | ORAL_TABLET | ORAL | 0 refills | Status: DC | PRN

## 2016-08-10 MED ORDER — DIAZEPAM 5 MG PO TABS: 5.0000 mg | ORAL_TABLET | Freq: Three times a day (TID) | ORAL | 0 refills | Status: DC | PRN

## 2016-08-10 NOTE — Discharge Summary (Signed)
Physician Discharge Summary  Patient ID: [name] MRN: [id number] DOB/AGE: 02-09-1966 51 y.o.  Admit date: 08/08/2016 Discharge date: 08/10/2016  Admission Diagnoses:  Discharge Diagnoses:  Active Problems:   Lumbar adjacent segment disease with spondylolisthesis   Discharged Condition: good  Hospital Course: Patient admitted to the hospital where she underwent uncomplicated lumbar decompression and fusion. Postoperative she is doing very well. Back and lower extremity pain much improved. Ready for discharge home.  Consults:   Significant Diagnostic Studies:   Treatments:   Discharge Exam: Blood pressure 110/70, pulse 80, temperature 97.8 F (36.6 C), temperature source Oral, resp. rate 20, last menstrual period 07/30/2013, SpO2 96 %. Awake and alert. Oriented and appropriate. Cranial nerve function intact. Motor and sensory function extremities normal. Wound clean and dry. Chest and abdomen benign.  Disposition: 01-Home or Self Care  Discharge Instructions    Change dressing    Complete by:  As directed      Allergies as of 08/10/2016      Reactions   Bee Venom Anaphylaxis   Adhesive [tape] Itching   Some types of adhesive tape and paper tape rip up the skin.     Morphine And Related Other (See Comments)   Severe headache      Medication List    STOP taking these medications   oxyCODONE-acetaminophen 10-325 MG tablet Commonly known as:  PERCOCET   PERCOCET 10-325 MG tablet Generic drug:  oxyCODONE-acetaminophen     TAKE these medications   albuterol 108 (90 Base) MCG/ACT inhaler Commonly known as:  PROVENTIL HFA;VENTOLIN HFA Inhale 2 puffs into the lungs every 6 (six) hours as needed for wheezing or shortness of breath.   BC HEADACHE POWDER PO Take 1 packet by mouth daily as needed (headaches).   Biotin 10000 MCG Tabs Take 10,000 mcg by mouth daily.   Calcium-Magnesium-Zinc-D3 Tabs Take 1 tablet by mouth 3 (three) times daily.   diazepam 5 MG  tablet Commonly known as:  VALIUM Take 1 tablet (5 mg total) by mouth 3 (three) times daily as needed for muscle spasms.   diclofenac 50 MG tablet Commonly known as:  CATAFLAM Take 1 tablet (50 mg total) by mouth 2 (two) times daily.   diclofenac sodium 1 % Gel Commonly known as:  VOLTAREN Apply 2 g topically 4 (four) times daily as needed (pain).   docusate sodium 250 MG capsule Commonly known as:  COLACE Take 250 mg by mouth at bedtime.   EPINEPHrine 0.3 mg/0.3 mL Soaj injection Commonly known as:  EPI-PEN Inject 0.3 mg into the muscle daily as needed (allergic reaction).   escitalopram 20 MG tablet Commonly known as:  LEXAPRO Take 20 mg by mouth daily before breakfast.   gabapentin 600 MG tablet Commonly known as:  NEURONTIN Take 600 mg by mouth 4 (four) times daily.   HYDROmorphone 2 MG tablet Commonly known as:  DILAUDID Take 1-2 tablets (2-4 mg total) by mouth every 4 (four) hours as needed for severe pain.   lisdexamfetamine 70 MG capsule Commonly known as:  VYVANSE Take 70 mg by mouth daily.   MYRBETRIQ 50 MG Tb24 tablet Generic drug:  mirabegron ER Take 50 mg by mouth daily.   pantoprazole 40 MG tablet Commonly known as:  PROTONIX Take 1 tablet (40 mg total) by mouth 2 (two) times daily.   PHILLIPS 500 MG (LAX) Tabs Generic drug:  Magnesium Oxide Take 500 mg by mouth at bedtime.   prednisoLONE acetate 1 % ophthalmic suspension Commonly known  as:  PRED FORTE Place 1 drop into both eyes 4 (four) times daily.   topiramate 50 MG tablet Commonly known as:  TOPAMAX Take 50 mg by mouth at bedtime.   varenicline 1 MG tablet Commonly known as:  CHANTIX Take 1 mg by mouth 2 (two) times daily.      Follow-up Information    Ophelia Charter, MD Follow up.   Specialty:  Neurosurgery Contact information: [address] [place] [address] [phone]           Signed: Charlie Pitter 08/10/2016, 10:24 AM

## 2016-08-10 NOTE — Progress Notes (Signed)
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    PT Comments    Pt making good progress with mobility.  Ambulated 300' with supervision using RW.  Educated and performed stair training this visit with supervision.  Patient safe to D/C from a mobility standpoint based on progression towards goals set on PT eval.    Follow Up Recommendations  No PT follow up     Equipment Recommendations  None recommended by PT    Recommendations for Other Services       Precautions / Restrictions Precautions Precautions: Back;Fall Precaution Comments: Pt able to recall 2/3 back precautions.  reviewed all 3.  Required Braces or Orthoses: Spinal Brace Spinal Brace: Applied in sitting position;Lumbar corset    Mobility  Bed Mobility Overal bed mobility: Modified Independent Bed Mobility: Rolling;Sidelying to Sit              Transfers Overall transfer level: Needs assistance Equipment used: Rolling walker (2 wheeled) Transfers: Sit to/from Stand Sit to Stand: Supervision         General transfer comment: cues to reinforce safe hand placement.   Ambulation/Gait Ambulation/Gait assistance: Supervision Ambulation Distance (Feet): 300 Feet Assistive device: Rolling walker (2 wheeled) Gait Pattern/deviations: Step-through pattern;Decreased stride length     General Gait Details: cues to relax shoulders and increase stride length.  pt steady with gt.    Stairs Stairs: Yes   Stair Management: Two rails;Step to pattern;Forwards Number of Stairs: 5 General stair comments: cues for sequencing.  supervsion for  safety  Wheelchair Mobility    Modified Rankin (Stroke Patients Only)       Balance                                    Cognition Arousal/Alertness: Awake/alert Behavior During Therapy: WFL for tasks assessed/performed Overall Cognitive Status: Within Functional Limits for tasks assessed                      Exercises      General Comments        Pertinent Vitals/Pain Pain Assessment: 0-10 Pain Score: 5  Pain Location: Back Pain Descriptors / Indicators: Discomfort;Sore Pain Intervention(s): Monitored during session;Premedicated before session;Repositioned    Home Living                      Prior Function            PT Goals (current goals can now be found in the care plan section) Acute Rehab PT Goals Patient Stated Goal: To return to independent PT Goal Formulation: With patient Time For Goal Achievement: 08/12/16 Potential to Achieve Goals: Good Progress towards PT goals: Progressing toward goals    Frequency    Min 5X/week      PT Plan Current plan remains appropriate    Co-evaluation             End of Session Equipment Utilized During Treatment: Back brace Activity Tolerance: Patient tolerated treatment well Patient left: in chair;with call bell/phone within reach  Time: 0850-0920 PT Time Calculation (min) (ACUTE ONLY): 30 min  Charges:  $Gait Training: 8-22 mins $Therapeutic Activity: 8-22 mins                    G Codes:      Sena Hitch 08/10/2016, 9:25 AM   Sarajane Marek, PTA [phone] 08/10/2016

## 2016-08-10 NOTE — Progress Notes (Signed)
Patient discharged home. Discharge instructions were reviewed with patient. Patient verbalized understanding.  

## 2016-08-11 NOTE — Anesthesia Postprocedure Evaluation (Addendum)
Anesthesia Post Note  Patient: MYKISHA LUMIA  Procedure(s) Performed: Procedure(s) (LRB): POSTERIOR LUMBAR INTERBODY FUSION, INTERBODY PROSTHESIS,POSTERIOR SEGMENTAL INSTRUMENTATION, POSTERIOR LATERAL ARTHRODESIS LUMBAR TWO- LUMBAR THREE, LUMBAR FOUR- LUMBAR FIVE (N/A)  Patient location during evaluation: PACU Anesthesia Type: General Level of consciousness: awake and sedated Pain management: pain level controlled Vital Signs Assessment: post-procedure vital signs reviewed and stable Respiratory status: spontaneous breathing Cardiovascular status: stable Postop Assessment: no signs of nausea or vomiting Anesthetic complications: no        Last Vitals:  Vitals:   08/10/16 0410 08/10/16 0931  BP: (!) 114/47 110/70  Pulse: 75 80  Resp: 18 20  Temp: 37.1 C 36.6 C    Last Pain:  Vitals:   08/10/16 1301  TempSrc:   PainSc: 9    Pain Goal: Patients Stated Pain Goal: 2 (08/10/16 1301)               Angelica Wix JR,JOHN Mate[facility] Flow

## 2016-08-13 MED FILL — Thrombin For Soln 20000 Unit: CUTANEOUS | Qty: 1 | Status: AC

## 2016-08-29 ENCOUNTER — Other Ambulatory Visit: Payer: Self-pay | Admitting: Orthopedic Surgery

## 2016-09-24 ENCOUNTER — Other Ambulatory Visit: Payer: Self-pay | Admitting: Orthopedic Surgery

## 2016-10-21 ENCOUNTER — Encounter: Payer: Self-pay | Admitting: Orthopedic Surgery

## 2016-10-21 ENCOUNTER — Ambulatory Visit (INDEPENDENT_AMBULATORY_CARE_PROVIDER_SITE_OTHER): Payer: Medicare Other | Admitting: Orthopedic Surgery

## 2016-10-21 VITALS — BP 110/71 | HR 90 | Wt 265.0 lb

## 2016-10-21 DIAGNOSIS — S83242D Other tear of medial meniscus, current injury, left knee, subsequent encounter: Secondary | ICD-10-CM | POA: Diagnosis not present

## 2016-10-21 NOTE — Progress Notes (Signed)
New problem  Chief Complaint  Patient presents with  . Knee Pain    LEFT KNEE PAIN AND SWELLING    51 year old female presents with pain swelling giving way symptoms of her left knee with pain over the medial joint line no history of trauma but long-standing history of pain over the medial joint. She did get cortisone injections. She's has a recent lumbar fusion relieved a lot of her back and hip pain and some of her leg pain but the giving way has persisted  Past Medical History:  Diagnosis Date  . ADHD (attention deficit hyperactivity disorder)   . Anxiety   . Arthritis    disk disease  . Barrett esophagus   . Chronic pain   . COPD (chronic obstructive pulmonary disease) ([place])   . Depression   . Dyspnea   . Fibromyalgia   . GERD (gastroesophageal reflux disease)    IBS  . Headache   . Hot flashes 04/11/2015  . IBS (irritable bowel syndrome)   . Obesity   . Peripheral arterial disease ([place])   . Plantar fasciitis of left foot   . PTSD (post-traumatic stress disorder)   . RLQ abdominal pain 04/11/2015  . Vocal cord cancer ([place])    surgery corrected the problem    Review of Systems  Constitutional: Negative for chills, fever and weight loss.  Respiratory: Negative for shortness of breath.   Cardiovascular: Negative for chest pain.   BP 110/71   Pulse 90   Wt 265 lb (120.2 kg)   BMI 43.43 kg/m  Appearance normal development normal hygiene and grooming. Right and left dorsalis pedis pulse intact. Right and left leg no significant swelling at the ankles. Gait and station remain unremarkable  No accessible limp is noted  Right knee full range of motion no tenderness or swelling all ligaments are stable muscle strength and tone was normal no effusion was seen skin was intact  Left knee medial joint line was tender knee flexion was full she had a little bit of extension deficit with pain ligaments were stable including anterior cruciate ligament PCL collateral ligaments at 0  and 30 muscle strength and tone were normal no effusion was noted skin was intact  She was oriented 3 mood and affect were normal  Passive nerve stretch tests were negative and pathologic reflexes were none in either leg including right or left  X-rays show minimal symmetric joint space narrowing. X-ray taken 2017  Encounter Diagnosis  Name Primary?  . Acute medial meniscus tear of left knee, subsequent encounter Yes    Recommend MRI to assess the left knee for medial meniscal tear and plan for surgical intervention if tear is found  Patient placed in a knee brace and will follow-up after MRI has been completed

## 2016-10-22 ENCOUNTER — Other Ambulatory Visit: Payer: Self-pay | Admitting: Orthopedic Surgery

## 2016-10-24 ENCOUNTER — Ambulatory Visit (HOSPITAL_COMMUNITY)
Admission: RE | Admit: 2016-10-24 | Discharge: 2016-10-24 | Disposition: A | Discharge status: Home / Self Care | Payer: Medicare Other | Source: Ambulatory Visit | Attending: Orthopedic Surgery | Admitting: Orthopedic Surgery

## 2016-10-24 DIAGNOSIS — X58XXXD Exposure to other specified factors, subsequent encounter: Secondary | ICD-10-CM | POA: Insufficient documentation

## 2016-10-24 DIAGNOSIS — S83242D Other tear of medial meniscus, current injury, left knee, subsequent encounter: Secondary | ICD-10-CM | POA: Diagnosis not present

## 2016-10-30 ENCOUNTER — Ambulatory Visit: Payer: Medicare Other | Admitting: Physical Therapy

## 2016-11-04 ENCOUNTER — Ambulatory Visit: Payer: Medicare Other | Attending: Neurosurgery | Admitting: Physical Therapy

## 2016-11-04 DIAGNOSIS — G8929 Other chronic pain: Secondary | ICD-10-CM | POA: Diagnosis present

## 2016-11-04 DIAGNOSIS — M545 Low back pain: Secondary | ICD-10-CM | POA: Insufficient documentation

## 2016-11-04 NOTE — Therapy (Signed)
[facility] [place], [place], [address] Phone: [phone]   Fax:  [phone]  Physical Therapy Evaluation  Patient Details  Name: [name] MRN: [id number] Date of Birth: [birth year] Referring Provider: Newman Pies MD.  Encounter Date: 11/04/2016      PT End of Session - 11/04/16 1947    Visit Number 1   Number of Visits 16   Date for PT Re-Evaluation 01/03/17   PT Start Time 0107   PT Stop Time 0152   PT Time Calculation (min) 45 min   Activity Tolerance Patient tolerated treatment well   Behavior During Therapy [facility] for tasks assessed/performed      Past Medical History:  Diagnosis Date  . ADHD (attention deficit hyperactivity disorder)   . Anxiety   . Arthritis    disk disease  . Barrett esophagus   . Chronic pain   . COPD (chronic obstructive pulmonary disease) ([place])   . Depression   . Dyspnea   . Fibromyalgia   . GERD (gastroesophageal reflux disease)    IBS  . Headache   . Hot flashes 04/11/2015  . IBS (irritable bowel syndrome)   . Obesity   . Peripheral arterial disease ([place])   . Plantar fasciitis of left foot   . PTSD (post-traumatic stress disorder)   . RLQ abdominal pain 04/11/2015  . Vocal cord cancer [facility])    surgery corrected the problem     Past Surgical History:  Procedure Laterality Date  . ANTERIOR CERVICAL DECOMP/DISCECTOMY FUSION  08/18/2012   Procedure: ANTERIOR CERVICAL DECOMPRESSION/DISCECTOMY FUSION 2 LEVELS;  Surgeon: Faythe Ghee, MD;  Location: [facility];  Service: Neurosurgery;  Laterality: N/A;  anterior cervical five-six,six-seven decompression fusion plating  . ANTERIOR LAT LUMBAR FUSION  03/19/2012   Procedure: ANTERIOR LATERAL LUMBAR FUSION 1 LEVEL;  Surgeon: Faythe Ghee, MD;  Location: [facility];  Service: Neurosurgery;  Laterality: Right;  Anterolateral Lumbar Interbody Fusion, Lumbar Three-Four (Hand broke through glove and nat in the room)  . BACK  SURGERY    . BUNIONECTOMY Left 08/04/2014   Procedure: LEFT FOOT LAPIDUS BUNION CORRECTION, LEFT SECOND TARSALMETATARSAL JOINT DEBRIDEMENT AND LEFT MODIFIED MCBRIDE BUNIONECTOMY;  Surgeon: [name], MD;  Location: [facility];  Service: Orthopedics;  Laterality: Left;  . CARPAL TUNNEL RELEASE Bilateral   . CLEFT PALATE REPAIR    . COLONOSCOPY N/A 02/16/2015   Procedure: COLONOSCOPY;  Surgeon: Rogene Houston, MD;  [facility];  Service: Endoscopy;  Laterality: N/A;  155  . ENDOMETRIAL ABLATION    . ESOPHAGOGASTRODUODENOSCOPY N/A 02/16/2015   Procedure: ESOPHAGOGASTRODUODENOSCOPY (EGD);  Surgeon: Rogene Houston, MD;  [facility];  Service: Endoscopy;  Laterality: N/A;  . LAPAROSCOPIC TUBAL LIGATION     [facility]  . MAXIMUM ACCESS (MAS)POSTERIOR LUMBAR INTERBODY FUSION (PLIF) 2 LEVEL  08/08/2016  . NOSE SURGERY    . SHOULDER OPEN ROTATOR CUFF REPAIR Right 04/02/2013   Procedure: ROTATOR CUFF REPAIR SHOULDER OPEN;  Surgeon: Carole Civil, MD;  Location: [facility];  Service: Orthopedics;  Laterality: Right;  . THROAT SURGERY  2000   for throat cancer/no problems intubation...[facility]  . TONSILLECTOMY    . TUBAL LIGATION      There were no vitals filed for this visit.       Subjective Assessment - 11/04/16 1937    Subjective The patient underwent a lumbar fusion surgery on 08/08/16.  She is reporting a pain-level rated  at 8/10 today.  She reports her left knee gives way but was recently diagnosed with a left medial meniscus tear.  She reports left sided low back pain radiating into her left LE.  She states she has not found anything to decrease her pain and "everything" increases her pain.     Pertinent History Fibromyalgia; previos neck and low back surgeries.   How long can you sit comfortably? 10 minutes.   How long can you stand comfortably? 5-10 minutes.   Diagnostic tests MRI.   Patient Stated Goals Reduce pain and be able to get  some quality of life back.   Currently in Pain? Yes   Pain Score 8    Pain Location Back   Pain Orientation Left;Lower   Pain Descriptors / Indicators Aching;Dull;Throbbing   Pain Type Chronic pain   Pain Radiating Towards Left side.   Pain Onset More than a month ago   Pain Frequency Constant   Aggravating Factors  See above.   Pain Relieving Factors See above.            [facility] PT Assessment - 11/04/16 0001      Assessment   Medical Diagnosis Spondylolisthesis, Lumbar region.   Referring Provider Newman Pies MD.   Onset Date/Surgical Date --  08/08/16(surgery date).     Precautions   Precautions --  Lumbar fusion. No spinal loading. No extension past neutral.     Restrictions   Weight Bearing Restrictions No     Balance Screen   Has the patient fallen in the past 6 months Yes   How many times? --  3.   Has the patient had a decrease in activity level because of a fear of falling?  Yes   Is the patient reluctant to leave their home because of a fear of falling?  No     Home Environment   Living Environment Private residence     Posture/Postural Control   Posture/Postural Control Postural limitations   Postural Limitations Rounded Shoulders;Forward head;Decreased lumbar lordosis     ROM / Strength   AROM / PROM / Strength AROM;Strength     AROM   Overall AROM Comments Deferred spinal ROM testing.  Hip ROM is WFL.     Strength   Overall Strength Comments Bilateral LE strength is normal.     Palpation   Palpation comment Tender to palpation over left lower lumbar musculature, left SIJ and upper gluteal region.     Special Tests    Special Tests --  Normal pat DTR's.  Absent Ach DTR's.     Transfers   Comments Slow and purposeful transitory movements.     Ambulation/Gait   Gait Comments The patient gait is remarkable for antalgia.                     [facility] Adult PT Treatment/Exercise - 11/04/16 0001      Modalities   Modalities  Electrical Stimulation;Moist Heat     Moist Heat Therapy   Number Minutes Moist Heat 15 Minutes   Moist Heat Location --  Low back.     Acupuncturist Location --  Left low back.   Electrical Stimulation Action Pre-mod.   Electrical Stimulation Parameters 80-150 Hz x 15 minutes.   Electrical Stimulation Goals Pain                  PT Short Term Goals - 11/04/16 1959  PT SHORT TERM GOAL #1   Title STG's=LTG's.           PT Long Term Goals - 03-Dec-2016 2000      PT LONG TERM GOAL #1   Title Indepenent with a HEP.   Time 8   Period Weeks   Status New     PT LONG TERM GOAL #2   Title Eliminate left LE symptoms.   Time 8   Period Weeks   Status New     PT LONG TERM GOAL #3   Title Perform ADL's with pain not > 3/10.   Time 8   Period Weeks   Status New               Plan - 12-03-2016 1955    Clinical Impression Statement The patient c/o left sided low back pain with radiation into her left LE.  She underwent a lumbar fusion surgery on 08/08/16 (L2-3 and L4-5).  Her pain-level prohibits her from performing ADL's.  She will benefit from skilled physical therapy.   Rehab Potential Good   PT Frequency 2x / week   PT Duration 8 weeks   PT Treatment/Interventions ADLs/Self Care Home Management;Cryotherapy;Electrical Stimulation;Ultrasound;Moist Heat;Therapeutic activities;Therapeutic exercise;Patient/family education;Manual techniques;Dry needling   PT Next Visit Plan Begin with low-level core exercise with no spinal extension past neutral.  STW/M and modalites PRN.   Consulted and Agree with Plan of Care Patient      Patient will benefit from skilled therapeutic intervention in order to improve the following deficits and impairments:  Pain, Decreased activity tolerance  Visit Diagnosis: Chronic left-sided low back pain, with sciatica presence unspecified - Plan: PT plan of care cert/re-cert      G-Codes -  2016/12/03 1311    Functional Assessment Tool Used (Outpatient Only) FOTO...67% limitation.   Functional Limitation Mobility: Walking and moving around   Mobility: Walking and Moving Around Current Status [phone]) At least 60 percent but less than 80 percent impaired, limited or restricted   Mobility: Walking and Moving Around Goal Status [phone]) At least 20 percent but less than 40 percent impaired, limited or restricted       Problem List Patient Active Problem List   Diagnosis Date Noted  . Lumbar adjacent segment disease with spondylolisthesis 08/08/2016  . RLQ abdominal pain 04/11/2015  . Hot flashes 04/11/2015  . Short-segment Barrett's esophagus 02/28/2015  . Peripheral arterial disease ([place]) 12/13/2013  . Status post rotator cuff repair 04/15/2013  . Rotator cuff tear 11/26/2012  . Rotator cuff syndrome of right shoulder 10/27/2012  . Right shoulder pain 10/27/2012  . HELICOBACTER PYLORI [place] INFECTION 11/03/2009  . DEPRESSION 11/02/2009  . RESTLESS LEG SYNDROME 11/02/2009  . GASTROESOPHAGEAL REFLUX DISEASE, CHRONIC 11/02/2009  . CONSTIPATION 11/02/2009  . IRRITABLE BOWEL SYNDROME 11/02/2009  . ARTHRITIS 11/02/2009  . BACK PAIN, CHRONIC 11/02/2009  . NAUSEA 11/02/2009  . DIARRHEA 11/02/2009  . ABDOMINAL PAIN 11/02/2009  . RECTAL BLEEDING, HX OF 11/02/2009    Janyra Barillas, [place] MPT 12/03/2016, 8:04 PM  [facility] [address] [place], [place], [address] Phone: [phone]   Fax:  [phone]  Name: [name] MRN: [id number] Date of Birth: 1966/05/23

## 2016-11-05 ENCOUNTER — Ambulatory Visit (INDEPENDENT_AMBULATORY_CARE_PROVIDER_SITE_OTHER): Payer: Medicare Other | Admitting: Orthopedic Surgery

## 2016-11-05 ENCOUNTER — Encounter: Payer: Self-pay | Admitting: Orthopedic Surgery

## 2016-11-05 DIAGNOSIS — S83242D Other tear of medial meniscus, current injury, left knee, subsequent encounter: Secondary | ICD-10-CM

## 2016-11-05 DIAGNOSIS — S83272D Complex tear of lateral meniscus, current injury, left knee, subsequent encounter: Secondary | ICD-10-CM | POA: Diagnosis not present

## 2016-11-05 NOTE — Patient Instructions (Signed)
This procedure has been fully reviewed with the patient and written informed consent has been obtained.You have decided to proceed with operative arthroscopy of the knee. You have decided not to continue with nonoperative measures such as but not limited to oral medication, weight loss, activity modification, physical therapy, bracing, or injection.  We will perform operative arthroscopy of the knee. Some of the risks associated with arthroscopic surgery of the knee include but are not limited to Bleeding Infection Swelling Stiffness Blood clot Pain  If you're not comfortable with these risks and would like to continue with nonoperative treatment please let Dr. Aline Brochure know prior to your surgery.

## 2016-11-05 NOTE — Progress Notes (Signed)
Patient ID: [name], female   DOB: [birth year], 51 y.o.   MRN: [id number]  MRI FOLLOW UP  Chief Complaint  Patient presents with  . Follow-up    MRI results of left knee from [facility].    HPI [name] is a 51 y.o. female.    51 year old female presents with pain swelling giving way symptoms of her left knee with pain over the medial joint line no history of trauma but long-standing history of pain over the medial joint. She did get cortisone injections. She's has a recent lumbar fusion relieved a lot of her back and hip pain and some of her leg pain but the giving way has persisted  MRI today was reviewed with the patient and we are proceeding with surgery       Past Medical History:  Diagnosis Date  . ADHD (attention deficit hyperactivity disorder)    . Anxiety    . Arthritis      disk disease  . Barrett esophagus    . Chronic pain    . COPD (chronic obstructive pulmonary disease) ([place])    . Depression    . Dyspnea    . Fibromyalgia    . GERD (gastroesophageal reflux disease)      IBS  . Headache    . Hot flashes 04/11/2015  . IBS (irritable bowel syndrome)    . Obesity    . Peripheral arterial disease ([place])    . Plantar fasciitis of left foot    . PTSD (post-traumatic stress disorder)    . RLQ abdominal pain 04/11/2015  . 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  Respiratory: Negative for shortness of breath.   Cardiovascular: Negative for chest pain.     MRI OF THE Left knee   ROS   As above   Physical Exam She is awake alert and oriented 3 has a small limp her mood and affect are normal grooming and hygiene are normal Prior exam showed primarily medial symptoms with tenderness decreased range of motion  Left knee medial joint line was tender knee flexion was full she had a little bit of extension deficit with pain ligaments were stable  including anterior cruciate ligament PCL collateral ligaments at 0 and 30 muscle strength and tone were normal no effusion was noted skin was intact   Data  Independent image interpretation the MRI showed  IMPRESSION: 1. Maceration of the anterior horn of the lateral meniscus with a 7 mm parameniscal cyst adjacent to the root. Increased signal and expansion of the anterior horn- body junction of the medial meniscus likely reflecting a small intrameniscal cyst. 2. Oblique tear of the posterior horn of the medial meniscus extending to the inferior articular surface. 3. Tricompartmental cartilage abnormalities as described above.  I see the arthritis the torn meniscus medially and laterally  Encounter Diagnoses  Name Primary?  . Acute medial meniscus tear of left knee, subsequent encounter Yes  . Complex tear of lateral meniscus of left knee as current injury, subsequent encounter      The plan is to  arthroscopy left knee medial and lateral meniscectomy for medial and lateral meniscus tear with arthritis  Arther Abbott, MD 11/05/2016 3:58 PM

## 2016-11-06 ENCOUNTER — Telehealth: Payer: Self-pay | Admitting: Orthopedic Surgery

## 2016-11-06 NOTE — Telephone Encounter (Signed)
Patient called and stated that she had a problem with getting a knee brace.  She would like to discuss this with you.    Please call her when you have a moment  Thanks

## 2016-11-07 ENCOUNTER — Encounter: Payer: Self-pay | Admitting: *Deleted

## 2016-11-07 ENCOUNTER — Other Ambulatory Visit: Payer: Self-pay | Admitting: *Deleted

## 2016-11-07 NOTE — Telephone Encounter (Signed)
Spoke with patient.

## 2016-11-07 NOTE — Progress Notes (Signed)
SURGICAL PRE AUTHORIZATION NOT REQUIRED FOR CPT 29881 PER PATIENT PLAN REFERENCE JOSHUA Z 1:46PM, 11/07/16

## 2016-11-11 ENCOUNTER — Encounter (HOSPITAL_COMMUNITY): Payer: Self-pay | Admitting: Anesthesiology

## 2016-11-12 ENCOUNTER — Encounter (HOSPITAL_COMMUNITY): Payer: Self-pay

## 2016-11-12 ENCOUNTER — Encounter (HOSPITAL_COMMUNITY)
Admission: RE | Admit: 2016-11-12 | Discharge: 2016-11-12 | Disposition: A | Discharge status: Home / Self Care | Payer: Medicare Other | Source: Ambulatory Visit | Attending: Orthopedic Surgery | Admitting: Orthopedic Surgery

## 2016-11-12 DIAGNOSIS — K219 Gastro-esophageal reflux disease without esophagitis: Secondary | ICD-10-CM | POA: Insufficient documentation

## 2016-11-12 DIAGNOSIS — F329 Major depressive disorder, single episode, unspecified: Secondary | ICD-10-CM | POA: Diagnosis not present

## 2016-11-12 DIAGNOSIS — M4326 Fusion of spine, lumbar region: Secondary | ICD-10-CM | POA: Insufficient documentation

## 2016-11-12 DIAGNOSIS — M797 Fibromyalgia: Secondary | ICD-10-CM | POA: Diagnosis not present

## 2016-11-12 DIAGNOSIS — F909 Attention-deficit hyperactivity disorder, unspecified type: Secondary | ICD-10-CM | POA: Insufficient documentation

## 2016-11-12 DIAGNOSIS — E669 Obesity, unspecified: Secondary | ICD-10-CM | POA: Insufficient documentation

## 2016-11-12 DIAGNOSIS — M25562 Pain in left knee: Secondary | ICD-10-CM | POA: Diagnosis not present

## 2016-11-12 DIAGNOSIS — S83282D Other tear of lateral meniscus, current injury, left knee, subsequent encounter: Secondary | ICD-10-CM | POA: Insufficient documentation

## 2016-11-12 DIAGNOSIS — Z01812 Encounter for preprocedural laboratory examination: Secondary | ICD-10-CM | POA: Diagnosis not present

## 2016-11-12 DIAGNOSIS — F419 Anxiety disorder, unspecified: Secondary | ICD-10-CM | POA: Insufficient documentation

## 2016-11-12 DIAGNOSIS — K589 Irritable bowel syndrome without diarrhea: Secondary | ICD-10-CM | POA: Insufficient documentation

## 2016-11-12 DIAGNOSIS — K2270 Barrett's esophagus without dysplasia: Secondary | ICD-10-CM | POA: Insufficient documentation

## 2016-11-12 DIAGNOSIS — X58XXXD Exposure to other specified factors, subsequent encounter: Secondary | ICD-10-CM | POA: Diagnosis not present

## 2016-11-12 DIAGNOSIS — J449 Chronic obstructive pulmonary disease, unspecified: Secondary | ICD-10-CM | POA: Insufficient documentation

## 2016-11-12 DIAGNOSIS — S83242D Other tear of medial meniscus, current injury, left knee, subsequent encounter: Secondary | ICD-10-CM | POA: Insufficient documentation

## 2016-11-12 DIAGNOSIS — G8929 Other chronic pain: Secondary | ICD-10-CM | POA: Diagnosis not present

## 2016-11-12 LAB — BASIC METABOLIC PANEL
ANION GAP: 7 (ref 5–15)
BUN: 10 mg/dL (ref 6–20)
CALCIUM: 9 mg/dL (ref 8.9–10.3)
CHLORIDE: 107 mmol/L (ref 101–111)
CO2: 24 mmol/L (ref 22–32)
CREATININE: 0.76 mg/dL (ref 0.44–1.00)
GFR calc non Af Amer: >60 mL/min (ref >60)
Glucose, Bld: 134 mg/dL — ABNORMAL HIGH (ref 65–99)
Potassium: 4.1 mmol/L (ref 3.5–5.1)
SODIUM: 138 mmol/L (ref 135–145)

## 2016-11-12 LAB — CBC
HCT: 39.8 % (ref 36.0–46.0)
HEMOGLOBIN: 12.7 g/dL (ref 12.0–15.0)
MCH: 29.8 pg (ref 26.0–34.0)
MCHC: 31.9 g/dL (ref 30.0–36.0)
MCV: 93.4 fL (ref 78.0–100.0)
Platelets: 356 10*3/uL (ref 150–400)
RBC: 4.26 MIL/uL (ref 3.87–5.11)
RDW: 13.8 % (ref 11.5–15.5)
WBC: 6.8 10*3/uL (ref 4.0–10.5)

## 2016-11-12 LAB — SURGICAL PCR SCREEN
MRSA, PCR: NEGATIVE
Staphylococcus aureus: NEGATIVE

## 2016-11-12 NOTE — Patient Instructions (Signed)
[name]  11/12/2016     @PREFPERIOPPHARMACY @   Your procedure is scheduled on 11/14/2016.  Report to [name] at 10:15 A.M.  Call this number if you have problems the morning of surgery:  [phone]   Remember:  Do not eat food or drink liquids after midnight.  Take these medicines the morning of surgery with A SIP OF WATER Albuterol inhaler and bring with you, Valium, Cataflam, Lexapro, Gabapentin,  Dilaudid OR Oxycodone if needed, Vyvanve, Myrbetriq, Protonix, Topamax, Chantix   Do not wear jewelry, make-up or nail polish.  Do not wear lotions, powders, or perfumes, or deoderant.  Do not shave 48 hours prior to surgery.  Men may shave face and neck.  Do not bring valuables to the hospital.  [name] is not responsible for any belongings or valuables.  Contacts, dentures or bridgework may not be worn into surgery.  Leave your suitcase in the car.  After surgery it may be brought to your room.  For patients admitted to the hospital, discharge time will be determined by your treatment team.  Patients discharged the day of surgery will not be allowed to drive home.    Please read over the following fact sheets that you were given. Surgical Site Infection Prevention and Anesthesia Post-op Instructions     PATIENT INSTRUCTIONS POST-ANESTHESIA  IMMEDIATELY FOLLOWING SURGERY:  Do not drive or operate machinery for the first twenty four hours after surgery.  Do not make any important decisions for twenty four hours after surgery or while taking narcotic pain medications or sedatives.  If you develop intractable nausea and vomiting or a severe headache please notify your doctor immediately.  FOLLOW-UP:  Please make an appointment with your surgeon as instructed. You do not need to follow up with anesthesia unless specifically instructed to do so.  WOUND CARE INSTRUCTIONS (if applicable):  Keep a dry clean dressing on the anesthesia/puncture wound site if there is  drainage.  Once the wound has quit draining you may leave it open to air.  Generally you should leave the bandage intact for twenty four hours unless there is drainage.  If the epidural site drains for more than 36-48 hours please call the anesthesia department.  QUESTIONS?:  Please feel free to call your physician or the hospital operator if you have any questions, and they will be happy to assist you.      Knee Arthroscopy Knee arthroscopy is a surgical procedure that is used to examine the inside of your knee joint and repair any damage. The surgeon puts a small, lighted instrument with a camera on the tip (arthroscope) through a small incision in your knee. The camera sends pictures to a monitor in the operating room. Your surgeon uses those pictures to guide the surgical instruments through other incisions to the area of damage. Knee arthroscopy can be used to treat many types of knee problems. It may be used:  To repair a torn ligament.  To repair or remove damaged tissue.  To remove a fluid-filled sac (cyst) from your knee. Tell a health care provider about:  Any allergies you have.  All medicines you are taking, including vitamins, herbs, eye drops, creams, and over-the-counter medicines.  Any problems you or family members have had with anesthetic medicines.  Any blood disorders you have.  Any surgeries you have had.  Any medical conditions you have. What are the risks? Generally, this is a safe procedure. However, problems may occur, including:  Infection.  Bleeding.  Damage to blood vessels, nerves, or structures of your knee.  A blood clot that forms in your leg and travels to your lung.  Failure to relieve symptoms. What happens before the procedure?  Ask your health care provider about:  Changing or stopping your regular medicines. This is especially important if you are taking diabetes medicines or blood thinners.  Taking medicines such as aspirin and  ibuprofen. These medicines can thin your blood. Do not take these medicines before your procedure if your health care provider instructs you not to.  Follow your health care provider's instructions about eating or drinking restrictions.  Plan to have someone take you home after the procedure.  If you go home right after the procedure, plan to have someone with you for 24 hours.  Do not drink alcohol unless your health care provider says that you can.  Do not use any tobacco products, including cigarettes, chewing tobacco, or electronic cigarettes unless your health care provider says that you can. If you need help quitting, ask your health care provider.  You may have a physical exam. What happens during the procedure?  An IV tube will be inserted into one of your veins.  You will be given one or more of the following:  A medicine that helps you relax (sedative).  A medicine that numbs the area (local anesthetic).  A medicine that makes you fall asleep (general anesthetic).  A medicine that is injected into your spine that numbs the area below and slightly above the injection site (spinal anesthetic).  A medicine that is injected into an area of your body that numbs everything below the injection site (regional anesthetic).  A cuff may be placed around your upper leg to slow bleeding during the procedure.  The surgeon will make a small number of incisions around your knee.  Your knee joint will be flushed and filled with a germ-free (sterile) solution.  The arthroscope will be passed through an incision into your knee joint.  More instruments will be passed through other incisions to repair your knee as needed.  The fluid will be removed from your knee.  The incisions will be closed with adhesive strips or stitches (sutures).  A bandage (dressing) will be placed over your knee. The procedure may vary among health care providers and hospitals. What happens after the  procedure?  Your blood pressure, heart rate, breathing rate and blood oxygen level will be monitored often until the medicines you were given have worn off.  You may be given medicine for pain.  You may get crutches to help you walk without using your knee to support your body weight.  You may have to wear compression stockings. These stocking help to prevent blood clots and reduce swelling in your legs. This information is not intended to replace advice given to you by your health care provider. Make sure you discuss any questions you have with your health care provider. Document Released: 07/12/2000 Document Revised: 12/21/2015 Document Reviewed: 07/11/2014 Elsevier Interactive Patient Education  2017 [employer].

## 2016-11-13 NOTE — Pre-Procedure Instructions (Signed)
Patient advised that she has three boils under right axilla, which she was touching with her hands.  States that they are painful.  PCR was performed and patient advised to use Betadine nasal swab.  Verbalized understanding.  Notified Dr Aline Brochure today and he states that he will take care of advising the patient that she will be cancelled and rescheduled after this has been resolved, therefore reducing her risk of infection.

## 2016-11-14 ENCOUNTER — Ambulatory Visit (HOSPITAL_COMMUNITY): Admission: RE | Admit: 2016-11-14 | Payer: Medicare Other | Source: Ambulatory Visit | Admitting: Orthopedic Surgery

## 2016-11-14 ENCOUNTER — Encounter (HOSPITAL_COMMUNITY): Admission: RE | Payer: Self-pay | Source: Ambulatory Visit

## 2016-11-14 SURGERY — ARTHROSCOPY, KNEE, WITH MEDIAL MENISCECTOMY
Anesthesia: General | Laterality: Left

## 2016-11-18 ENCOUNTER — Ambulatory Visit: Payer: Medicare Other | Admitting: Orthopedic Surgery

## 2016-11-18 ENCOUNTER — Telehealth: Payer: Self-pay | Admitting: Orthopedic Surgery

## 2016-11-18 NOTE — Telephone Encounter (Signed)
Called patient; aware. 

## 2016-11-18 NOTE — Telephone Encounter (Signed)
Patient called to ask if her special order/size knee brace has come in yet? Her phone [phone]

## 2016-11-18 NOTE — Telephone Encounter (Signed)
PLEASE ADVISE YES, SHE CAN COME PICK UP TOMORROW

## 2016-11-26 ENCOUNTER — Ambulatory Visit: Payer: Medicare Other | Attending: Neurosurgery | Admitting: *Deleted

## 2016-11-26 DIAGNOSIS — M545 Low back pain: Secondary | ICD-10-CM | POA: Diagnosis not present

## 2016-11-26 DIAGNOSIS — G8929 Other chronic pain: Secondary | ICD-10-CM

## 2016-11-26 NOTE — Therapy (Signed)
[facility] [place], [place], [address] Phone: [phone]   Fax:  [phone]  Physical Therapy Treatment  Patient Details  Name: [name] MRN: [id number] Date of Birth: April 09, 1966 Referring Provider: Newman Pies MD.  Encounter Date: 11/26/2016      PT End of Session - 11/26/16 1443    Visit Number 2   Number of Visits 16   Date for PT Re-Evaluation 01/03/17   PT Start Time 5956   PT Stop Time 1533   PT Time Calculation (min) 50 min      Past Medical History:  Diagnosis Date  . ADHD (attention deficit hyperactivity disorder)   . Anxiety   . Arthritis    disk disease  . Barrett esophagus   . Chronic pain   . COPD (chronic obstructive pulmonary disease) ([place])   . Depression   . Dyspnea   . Fibromyalgia   . GERD (gastroesophageal reflux disease)    IBS  . Headache   . Hot flashes 04/11/2015  . IBS (irritable bowel syndrome)   . Obesity   . Peripheral arterial disease ([place])   . Plantar fasciitis of left foot   . PTSD (post-traumatic stress disorder)   . RLQ abdominal pain 04/11/2015  . Vocal cord cancer [facility])    surgery corrected the problem     Past Surgical History:  Procedure Laterality Date  . ANTERIOR CERVICAL DECOMP/DISCECTOMY FUSION  08/18/2012   Procedure: ANTERIOR CERVICAL DECOMPRESSION/DISCECTOMY FUSION 2 LEVELS;  Surgeon: Faythe Ghee, MD;  Location: [place] [facility];  Service: Neurosurgery;  Laterality: N/A;  anterior cervical five-six,six-seven decompression fusion plating  . ANTERIOR LAT LUMBAR FUSION  03/19/2012   Procedure: ANTERIOR LATERAL LUMBAR FUSION 1 LEVEL;  Surgeon: Faythe Ghee, MD;  Location: [place] [facility] ORS;  Service: Neurosurgery;  Laterality: Right;  Anterolateral Lumbar Interbody Fusion, Lumbar Three-Four (Hand broke through glove and nat in the room)  . BACK SURGERY    . BUNIONECTOMY Left 08/04/2014   Procedure: LEFT FOOT LAPIDUS BUNION CORRECTION, LEFT SECOND  TARSALMETATARSAL JOINT DEBRIDEMENT AND LEFT MODIFIED MCBRIDE BUNIONECTOMY;  Surgeon: [name], MD;  Location: [place];  Service: Orthopedics;  Laterality: Left;  . CARPAL TUNNEL RELEASE Bilateral   . CLEFT PALATE REPAIR    . COLONOSCOPY N/A 02/16/2015   Procedure: COLONOSCOPY;  Surgeon: Rogene Houston, MD;  [facility];  Service: Endoscopy;  Laterality: N/A;  155  . ENDOMETRIAL ABLATION    . ESOPHAGOGASTRODUODENOSCOPY N/A 02/16/2015   Procedure: ESOPHAGOGASTRODUODENOSCOPY (EGD);  Surgeon: Rogene Houston, MD;  [facility];  Service: Endoscopy;  Laterality: N/A;  . LAPAROSCOPIC TUBAL LIGATION     [facility]  . MAXIMUM ACCESS (MAS)POSTERIOR LUMBAR INTERBODY FUSION (PLIF) 2 LEVEL  08/08/2016  . NOSE SURGERY    . SHOULDER OPEN ROTATOR CUFF REPAIR Right 04/02/2013   Procedure: ROTATOR CUFF REPAIR SHOULDER OPEN;  Surgeon: Carole Civil, MD;  [facility];  Service: Orthopedics;  Laterality: Right;  . THROAT SURGERY  2000   for throat cancer/no problems intubation...[facility]  . TONSILLECTOMY    . TUBAL LIGATION      There were no vitals filed for this visit.      Subjective Assessment - 11/26/16 1445    Subjective The patient underwent a lumbar fusion surgery on 08/08/16.  She is reporting a pain-level rated at 8/10 today.  She reports her left knee gives way but was recently diagnosed with a left  medial meniscus tear.  She reports left sided low back pain radiating into her left LE.  She states she has not found anything to decrease her pain and "everything" increases her pain.     Pertinent History Fibromyalgia; previos neck and low back surgeries.   How long can you sit comfortably? 10 minutes.   How long can you stand comfortably? 5-10 minutes.   Diagnostic tests MRI.   Patient Stated Goals Reduce pain and be able to get some quality of life back.   Currently in Pain? Yes   Pain Score 8    Pain Location Knee   Pain  Orientation Left   Pain Descriptors / Indicators Aching;Dull   Pain Type Chronic pain   Pain Onset More than a month ago                         [facility] Adult PT Treatment/Exercise - 11/26/16 0001      Modalities   Modalities Electrical Stimulation;Moist Heat;Ultrasound     Moist Heat Therapy   Number Minutes Moist Heat 15 Minutes   Moist Heat Location Lumbar Spine  Low back.     Acupuncturist Location LB paras in prone x 15 mins 2 chs 80-150hz   Left low back.   Electrical Stimulation Goals Pain     Ultrasound   Ultrasound Location LB paras   Ultrasound Parameters 1.5 w/cm2 x 10 mins prone   Ultrasound Goals Pain     Manual Therapy   Manual Therapy Soft tissue mobilization;Myofascial release   Soft tissue mobilization IASTM/ STW to Bil LB paras in prone                  PT Short Term Goals - 11/04/16 1959      PT SHORT TERM GOAL #1   Title STG's=LTG's.           PT Long Term Goals - 11/04/16 2000      PT LONG TERM GOAL #1   Title Indepenent with a HEP.   Time 8   Period Weeks   Status New     PT LONG TERM GOAL #2   Title Eliminate left LE symptoms.   Time 8   Period Weeks   Status New     PT LONG TERM GOAL #3   Title Perform ADL's with pain not > 3/10.   Time 8   Period Weeks   Status New               Plan - 11/26/16 1443    Clinical Impression Statement The Pt arrived today with increased LBP and mm tightness. She requested STW and modalities to help with pain. [place] and STW were performed and Pt had decreased pain after Rx. She was very tender on LT side lumbar paras near L5. She tolerated IASTM better than normal  manual STW . She was able to lie in the prone position for entire Rx. LTGs are ongoing due to pain.  Pt reports that her knee surgery was CX due to possibly having MRSA from her daughter using her razor. She is currently on antibiotics and knee surgry will be rescheduled.    Rehab Potential Good   PT Frequency 2x / week   PT Duration 8 weeks   PT Treatment/Interventions ADLs/Self Care Home Management;Cryotherapy;Electrical Stimulation;Ultrasound;Moist Heat;Therapeutic activities;Therapeutic exercise;Patient/family education;Manual techniques;Dry needling   PT Next Visit Plan Begin with low-level core exercise with no  spinal extension past neutral.  STW/M and modalites PRN.   Consulted and Agree with Plan of Care Patient      Patient will benefit from skilled therapeutic intervention in order to improve the following deficits and impairments:  Pain, Decreased activity tolerance  Visit Diagnosis: Chronic left-sided low back pain, with sciatica presence unspecified     Problem List Patient Active Problem List   Diagnosis Date Noted  . Lumbar adjacent segment disease with spondylolisthesis 08/08/2016  . RLQ abdominal pain 04/11/2015  . Hot flashes 04/11/2015  . Short-segment Barrett's esophagus 02/28/2015  . Peripheral arterial disease ([place]) 12/13/2013  . Status post rotator cuff repair 04/15/2013  . Rotator cuff tear 11/26/2012  . Rotator cuff syndrome of right shoulder 10/27/2012  . Right shoulder pain 10/27/2012  . HELICOBACTER PYLORI [place] INFECTION 11/03/2009  . DEPRESSION 11/02/2009  . RESTLESS LEG SYNDROME 11/02/2009  . GASTROESOPHAGEAL REFLUX DISEASE, CHRONIC 11/02/2009  . CONSTIPATION 11/02/2009  . IRRITABLE BOWEL SYNDROME 11/02/2009  . ARTHRITIS 11/02/2009  . BACK PAIN, CHRONIC 11/02/2009  . NAUSEA 11/02/2009  . DIARRHEA 11/02/2009  . ABDOMINAL PAIN 11/02/2009  . RECTAL BLEEDING, HX OF 11/02/2009    Isac Lincks,CHRIS, PTA 11/26/2016, 5:55 PM  [facility] [address] [place], [place], [address] Phone: [phone]   Fax:  [phone]  Name: [name] MRN: [id number] Date of Birth: 1965/11/20

## 2016-11-29 ENCOUNTER — Encounter: Payer: Medicare Other | Admitting: Physical Therapy

## 2016-12-03 ENCOUNTER — Other Ambulatory Visit: Payer: Self-pay | Admitting: Orthopedic Surgery

## 2016-12-04 ENCOUNTER — Telehealth: Payer: Self-pay | Admitting: *Deleted

## 2016-12-04 ENCOUNTER — Encounter: Payer: Medicare Other | Admitting: Physical Therapy

## 2016-12-04 NOTE — Telephone Encounter (Signed)
Patient c/o extra large brace is falling down her leg. I exchanged it for a large.

## 2016-12-06 ENCOUNTER — Encounter: Payer: Medicare Other | Admitting: Physical Therapy

## 2016-12-09 ENCOUNTER — Ambulatory Visit: Payer: Medicare Other | Admitting: Physical Therapy

## 2016-12-09 ENCOUNTER — Encounter: Payer: Medicare Other | Admitting: Physical Therapy

## 2016-12-09 NOTE — Therapy (Signed)
[facility] [place], [place], [address] Phone: [phone]   Fax:  [phone]  Physical Therapy Treatment  Patient Details  Name: [name] MRN: [id number] Date of Birth: 1966-06-20 Referring Provider: Newman Pies MD.  Encounter Date: 12/09/2016    Past Medical History:  Diagnosis Date  . ADHD (attention deficit hyperactivity disorder)   . Anxiety   . Arthritis    disk disease  . Barrett esophagus   . Chronic pain   . COPD (chronic obstructive pulmonary disease) ([place])   . Depression   . Dyspnea   . Fibromyalgia   . GERD (gastroesophageal reflux disease)    IBS  . Headache   . Hot flashes 04/11/2015  . IBS (irritable bowel syndrome)   . Obesity   . Peripheral arterial disease ([place])   . Plantar fasciitis of left foot   . PTSD (post-traumatic stress disorder)   . RLQ abdominal pain 04/11/2015  . Vocal cord cancer [facility])    surgery corrected the problem     Past Surgical History:  Procedure Laterality Date  . ANTERIOR CERVICAL DECOMP/DISCECTOMY FUSION  08/18/2012   Procedure: ANTERIOR CERVICAL DECOMPRESSION/DISCECTOMY FUSION 2 LEVELS;  Surgeon: Faythe Ghee, MD;  Location: [place] NE[facility] ORS;  Service: Neurosurgery;  Laterality: N/A;  anterior cervical five-six,six-seven decompression fusion plating  . ANTERIOR LAT LUMBAR FUSION  03/19/2012   Procedure: ANTERIOR LATERAL LUMBAR FUSION 1 LEVEL;  Surgeon: Faythe Ghee, MD;  Location: [place] NEUR[facility] ORS;  Service: Neurosurgery;  Laterality: Right;  Anterolateral Lumbar Interbody Fusion, Lumbar Three-Four (Hand broke through glove and nat in the room)  . BACK SURGERY    . BUNIONECTOMY Left 08/04/2014   Procedure: LEFT FOOT LAPIDUS BUNION CORRECTION, LEFT SECOND TARSALMETATARSAL JOINT DEBRIDEMENT AND LEFT MODIFIED MCBRIDE BUNIONECTOMY;  Surgeon: [name], MD;  Location: [place];  Service: Orthopedics;  Laterality: Left;  . CARPAL TUNNEL RELEASE  Bilateral   . CLEFT PALATE REPAIR    . COLONOSCOPY N/A 02/16/2015   Procedure: COLONOSCOPY;  Surgeon: Rogene Houston, MD;  [facility];  Service: Endoscopy;  Laterality: N/A;  155  . ENDOMETRIAL ABLATION    . ESOPHAGOGASTRODUODENOSCOPY N/A 02/16/2015   Procedure: ESOPHAGOGASTRODUODENOSCOPY (EGD);  Surgeon: Rogene Houston, MD;  [facility];  Service: Endoscopy;  Laterality: N/A;  . LAPAROSCOPIC TUBAL LIGATION     [facility]  . MAXIMUM ACCESS (MAS)POSTERIOR LUMBAR INTERBODY FUSION (PLIF) 2 LEVEL  08/08/2016  . NOSE SURGERY    . SHOULDER OPEN ROTATOR CUFF REPAIR Right 04/02/2013   Procedure: ROTATOR CUFF REPAIR SHOULDER OPEN;  Surgeon: Carole Civil, MD;  [facility];  Service: Orthopedics;  Laterality: Right;  . THROAT SURGERY  2000   for throat cancer/no problems intubation...[place]  . TONSILLECTOMY    . TUBAL LIGATION      There were no vitals filed for this visit.            Therapist had an unscheduled appt nd patient arrived with no charge                     PT Short Term Goals - 11/04/16 1959      PT SHORT TERM GOAL #1   Title STG's=LTG's.           PT Long Term Goals - 11/04/16 2000      PT LONG TERM GOAL #1   Title Indepenent with a HEP.   Time 8  Period Weeks   Status New     PT LONG TERM GOAL #2   Title Eliminate left LE symptoms.   Time 8   Period Weeks   Status New     PT LONG TERM GOAL #3   Title Perform ADL's with pain not > 3/10.   Time 8   Period Weeks   Status New             Patient will benefit from skilled therapeutic intervention in order to improve the following deficits and impairments:     Visit Diagnosis: Chronic left-sided low back pain, with sciatica presence unspecified     Problem List Patient Active Problem List   Diagnosis Date Noted  . Lumbar adjacent segment disease with spondylolisthesis 08/08/2016  . RLQ abdominal pain 04/11/2015  . Hot  flashes 04/11/2015  . Short-segment Barrett's esophagus 02/28/2015  . Peripheral arterial disease ([place]) 12/13/2013  . Status post rotator cuff repair 04/15/2013  . Rotator cuff tear 11/26/2012  . Rotator cuff syndrome of right shoulder 10/27/2012  . Right shoulder pain 10/27/2012  . HELICOBACTER PYLORI [place] INFECTION 11/03/2009  . DEPRESSION 11/02/2009  . RESTLESS LEG SYNDROME 11/02/2009  . GASTROESOPHAGEAL REFLUX DISEASE, CHRONIC 11/02/2009  . CONSTIPATION 11/02/2009  . IRRITABLE BOWEL SYNDROME 11/02/2009  . ARTHRITIS 11/02/2009  . BACK PAIN, CHRONIC 11/02/2009  . NAUSEA 11/02/2009  . DIARRHEA 11/02/2009  . ABDOMINAL PAIN 11/02/2009  . RECTAL BLEEDING, HX OF 11/02/2009    Shandi Godfrey P, PTA 12/09/2016, 11:56 AM  [facility] [place], [place], [address] Phone: [phone]   Fax:  [phone]  Name: [name] MRN: [id number] Date of Birth: 11/12/1965

## 2016-12-12 ENCOUNTER — Ambulatory Visit: Payer: Medicare Other | Admitting: *Deleted

## 2016-12-12 DIAGNOSIS — M545 Low back pain: Principal | ICD-10-CM

## 2016-12-12 DIAGNOSIS — G8929 Other chronic pain: Secondary | ICD-10-CM

## 2016-12-12 NOTE — Therapy (Signed)
[facility] [place], [place], [address] Phone: [phone]   Fax:  [phone]  Physical Therapy Treatment  Patient Details  Name: [name] MRN: [id number] Date of Birth: May 06, 1966 Referring Provider: Newman Pies MD.  Encounter Date: 12/12/2016      PT End of Session - 12/12/16 1207    Visit Number 3   Number of Visits 16   Date for PT Re-Evaluation 01/03/17   PT Start Time 8768   PT Stop Time 1202   PT Time Calculation (min) 37 min      Past Medical History:  Diagnosis Date  . ADHD (attention deficit hyperactivity disorder)   . Anxiety   . Arthritis    disk disease  . Barrett esophagus   . Chronic pain   . COPD (chronic obstructive pulmonary disease) ([place])   . Depression   . Dyspnea   . Fibromyalgia   . GERD (gastroesophageal reflux disease)    IBS  . Headache   . Hot flashes 04/11/2015  . IBS (irritable bowel syndrome)   . Obesity   . Peripheral arterial disease ([place])   . Plantar fasciitis of left foot   . PTSD (post-traumatic stress disorder)   . RLQ abdominal pain 04/11/2015  . Vocal cord cancer [facility])    surgery corrected the problem     Past Surgical History:  Procedure Laterality Date  . ANTERIOR CERVICAL DECOMP/DISCECTOMY FUSION  08/18/2012   Procedure: ANTERIOR CERVICAL DECOMPRESSION/DISCECTOMY FUSION 2 LEVELS;  Surgeon: Faythe Ghee, MD;  Location: [place] [facility];  Service: Neurosurgery;  Laterality: N/A;  anterior cervical five-six,six-seven decompression fusion plating  . ANTERIOR LAT LUMBAR FUSION  03/19/2012   Procedure: ANTERIOR LATERAL LUMBAR FUSION 1 LEVEL;  Surgeon: Faythe Ghee, MD;  Location: [place] [facility]UR[facility] ORS;  Service: Neurosurgery;  Laterality: Right;  Anterolateral Lumbar Interbody Fusion, Lumbar Three-Four (Hand broke through glove and nat in the room)  . BACK SURGERY    . BUNIONECTOMY Left 08/04/2014   Procedure: LEFT FOOT LAPIDUS BUNION CORRECTION, LEFT SECOND  TARSALMETATARSAL JOINT DEBRIDEMENT AND LEFT MODIFIED MCBRIDE BUNIONECTOMY;  Surgeon: [name], MD;  Location: [place];  Service: Orthopedics;  Laterality: Left;  . CARPAL TUNNEL RELEASE Bilateral   . CLEFT PALATE REPAIR    . COLONOSCOPY N/A 02/16/2015   Procedure: COLONOSCOPY;  Surgeon: Rogene Houston, MD;  [facility];  Service: Endoscopy;  Laterality: N/A;  155  . ENDOMETRIAL ABLATION    . ESOPHAGOGASTRODUODENOSCOPY N/A 02/16/2015   Procedure: ESOPHAGOGASTRODUODENOSCOPY (EGD);  Surgeon: Rogene Houston, MD;  [facility];  Service: Endoscopy;  Laterality: N/A;  . LAPAROSCOPIC TUBAL LIGATION     [facility]  . MAXIMUM ACCESS (MAS)POSTERIOR LUMBAR INTERBODY FUSION (PLIF) 2 LEVEL  08/08/2016  . NOSE SURGERY    . SHOULDER OPEN ROTATOR CUFF REPAIR Right 04/02/2013   Procedure: ROTATOR CUFF REPAIR SHOULDER OPEN;  Surgeon: Carole Civil, MD;  [facility];  Service: Orthopedics;  Laterality: Right;  . THROAT SURGERY  2000   for throat cancer/no problems intubation...[facility]  . TONSILLECTOMY    . TUBAL LIGATION      There were no vitals filed for this visit.                       [facility] Adult PT Treatment/Exercise - 12/12/16 0001      Modalities   Modalities Electrical Stimulation;Moist Heat;Ultrasound     Ultrasound  Ultrasound Location LB paras   Ultrasound Parameters 1.5 w/cm2 x 10 mins   Ultrasound Goals Pain     Manual Therapy   Manual Therapy Soft tissue mobilization;Myofascial release   Soft tissue mobilization IASTM/ STW to Bil LB paras in prone                  PT Short Term Goals - 11/04/16 1959      PT SHORT TERM GOAL #1   Title STG's=LTG's.           PT Long Term Goals - 11/04/16 2000      PT LONG TERM GOAL #1   Title Indepenent with a HEP.   Time 8   Period Weeks   Status New     PT LONG TERM GOAL #2   Title Eliminate left LE symptoms.   Time 8   Period Weeks    Status New     PT LONG TERM GOAL #3   Title Perform ADL's with pain not > 3/10.   Time 8   Period Weeks   Status New               Plan - 12/12/16 1744    Clinical Impression Statement The Pt arrived 10 mins late today. She requested [place] and STW again today due to pain and MM tightness. She was very tender on LT side again near SIJ and QL. She tolerated IASTM very well, but was tender with moderate STW pressure. LTGs are ongoing due to pain.   Rehab Potential Good   PT Frequency 2x / week   PT Duration 8 weeks   PT Treatment/Interventions ADLs/Self Care Home Management;Cryotherapy;Electrical Stimulation;Ultrasound;Moist Heat;Therapeutic activities;Therapeutic exercise;Patient/family education;Manual techniques;Dry needling   PT Next Visit Plan Begin with low-level core exercise with no spinal extension past neutral.  STW/M and modalites PRN.   Consulted and Agree with Plan of Care Patient      Patient will benefit from skilled therapeutic intervention in order to improve the following deficits and impairments:  Pain, Decreased activity tolerance  Visit Diagnosis: Chronic left-sided low back pain, with sciatica presence unspecified     Problem List Patient Active Problem List   Diagnosis Date Noted  . Lumbar adjacent segment disease with spondylolisthesis 08/08/2016  . RLQ abdominal pain 04/11/2015  . Hot flashes 04/11/2015  . Short-segment Barrett's esophagus 02/28/2015  . Peripheral arterial disease ([place]) 12/13/2013  . Status post rotator cuff repair 04/15/2013  . Rotator cuff tear 11/26/2012  . Rotator cuff syndrome of right shoulder 10/27/2012  . Right shoulder pain 10/27/2012  . HELICOBACTER PYLORI [place] INFECTION 11/03/2009  . DEPRESSION 11/02/2009  . RESTLESS LEG SYNDROME 11/02/2009  . GASTROESOPHAGEAL REFLUX DISEASE, CHRONIC 11/02/2009  . CONSTIPATION 11/02/2009  . IRRITABLE BOWEL SYNDROME 11/02/2009  . ARTHRITIS 11/02/2009  . BACK PAIN, CHRONIC  11/02/2009  . NAUSEA 11/02/2009  . DIARRHEA 11/02/2009  . ABDOMINAL PAIN 11/02/2009  . RECTAL BLEEDING, HX OF 11/02/2009    Jazzalyn Loewenstein,CHRIS 12/12/2016, 5:55 PM  [facility] [address] [place], [place], [address] Phone: [phone]   Fax:  [phone]  Name: [name] MRN: [id number] Date of Birth: 06-Jul-1966

## 2016-12-16 ENCOUNTER — Ambulatory Visit: Payer: Medicare Other | Admitting: Physical Therapy

## 2016-12-16 DIAGNOSIS — M545 Low back pain: Secondary | ICD-10-CM | POA: Diagnosis not present

## 2016-12-16 DIAGNOSIS — G8929 Other chronic pain: Secondary | ICD-10-CM

## 2016-12-16 NOTE — Patient Instructions (Addendum)
Trigger Point Dry Needling  . What is Trigger Point Dry Needling (DN)? o DN is a physical therapy technique used to treat muscle pain and dysfunction. Specifically, DN helps deactivate muscle trigger points (muscle knots).  o A thin filiform needle is used to penetrate the skin and stimulate the underlying trigger point. The goal is for a local twitch response (LTR) to occur and for the trigger point to relax. No medication of any kind is injected during the procedure.   . What Does Trigger Point Dry Needling Feel Like?  o The procedure feels different for each individual patient. Some patients report that they do not actually feel the needle enter the skin and overall the process is not painful. Very mild bleeding may occur. However, many patients feel a deep cramping in the muscle in which the needle was inserted. This is the local twitch response.   Marland Kitchen How Will I feel after the treatment? o Soreness is normal, and the onset of soreness may not occur for a few hours. Typically this soreness does not last longer than two days.  o Bruising is uncommon, however; ice can be used to decrease any possible bruising.  o In rare cases feeling tired or nauseous after the treatment is normal. In addition, your symptoms may get worse before they get better, this period will typically not last longer than 24 hours.   . What Can I do After My Treatment? o Increase your hydration by drinking more water for the next 24 hours. o You may place ice or heat on the areas treated that have become sore, however, do not use heat on inflamed or bruised areas. Heat often brings more relief post needling. o You can continue your regular activities, but vigorous activity is not recommended initially after the treatment for 24 hours. o DN is best combined with other physical therapy such as strengthening, stretching, and other therapies.    Precautions:  In some cases, dry needling is done over the lung field. While rare,  there is a risk of pneumothorax (punctured lung). Because of this, if you ever experience shortness of breath on exertion, difficulty taking a deep breath, chest pain or a dry cough following dry needling, you should report to an emergency room and tell them that you have been dry needled over the thorax.    Iliotibial Band Stretch, Side-Lying   Lie on side, back to edge of bed, top arm in front. Allow top leg to drape behind over edge. Hold 60 or more seconds.  Repeat 3 times per session. Do _2-3 sessions per day.   Stretching: Piriformis (Supine)  Pull right knee toward opposite shoulder. Hold _30-60___ seconds. Relax. Repeat __3__ times per set. Do ____ sets per session. Do ___2_ sessions per day.   Madelyn Flavors, PT 12/16/16 3:13 PM [facility] [address] [place], [place], [address] Phone: [phone]   Fax:  [phone]

## 2016-12-16 NOTE — Therapy (Signed)
[facility] [place], [place], [address] Phone: [phone]   Fax:  [phone]  Physical Therapy Treatment  Patient Details  Name: [name] MRN: [id number] Date of Birth: 1966-06-25 Referring Provider: Newman Pies MD.  Encounter Date: 12/16/2016      PT End of Session - 12/16/16 1439    Visit Number 4   Number of Visits 16   Date for PT Re-Evaluation 01/03/17   PT Start Time 3009   PT Stop Time 1526   PT Time Calculation (min) 51 min   Activity Tolerance Patient tolerated treatment well   Behavior During Therapy [place] for tasks assessed/performed      Past Medical History:  Diagnosis Date  . ADHD (attention deficit hyperactivity disorder)   . Anxiety   . Arthritis    disk disease  . Barrett esophagus   . Chronic pain   . COPD (chronic obstructive pulmonary disease) ([place])   . Depression   . Dyspnea   . Fibromyalgia   . GERD (gastroesophageal reflux disease)    IBS  . Headache   . Hot flashes 04/11/2015  . IBS (irritable bowel syndrome)   . Obesity   . Peripheral arterial disease ([place])   . Plantar fasciitis of left foot   . PTSD (post-traumatic stress disorder)   . RLQ abdominal pain 04/11/2015  . Vocal cord cancer [facility])    surgery corrected the problem     Past Surgical History:  Procedure Laterality Date  . ANTERIOR CERVICAL DECOMP/DISCECTOMY FUSION  08/18/2012   Procedure: ANTERIOR CERVICAL DECOMPRESSION/DISCECTOMY FUSION 2 LEVELS;  Surgeon: Faythe Ghee, MD;  Location: [facility];  Service: Neurosurgery;  Laterality: N/A;  anterior cervical five-six,six-seven decompression fusion plating  . ANTERIOR LAT LUMBAR FUSION  03/19/2012   Procedure: ANTERIOR LATERAL LUMBAR FUSION 1 LEVEL;  Surgeon: Faythe Ghee, MD;  Location: [place] [facility];  Service: Neurosurgery;  Laterality: Right;  Anterolateral Lumbar Interbody Fusion, Lumbar Three-Four (Hand broke through glove and nat in the room)  . BACK  SURGERY    . BUNIONECTOMY Left 08/04/2014   Procedure: LEFT FOOT LAPIDUS BUNION CORRECTION, LEFT SECOND TARSALMETATARSAL JOINT DEBRIDEMENT AND LEFT MODIFIED MCBRIDE BUNIONECTOMY;  Surgeon: [name], MD;  Location: [place];  Service: Orthopedics;  Laterality: Left;  . CARPAL TUNNEL RELEASE Bilateral   . CLEFT PALATE REPAIR    . COLONOSCOPY N/A 02/16/2015   Procedure: COLONOSCOPY;  Surgeon: Rogene Houston, MD;  [facility];  Service: Endoscopy;  Laterality: N/A;  155  . ENDOMETRIAL ABLATION    . ESOPHAGOGASTRODUODENOSCOPY N/A 02/16/2015   Procedure: ESOPHAGOGASTRODUODENOSCOPY (EGD);  Surgeon: Rogene Houston, MD;  [facility];  Service: Endoscopy;  Laterality: N/A;  . LAPAROSCOPIC TUBAL LIGATION     [facility]  . MAXIMUM ACCESS (MAS)POSTERIOR LUMBAR INTERBODY FUSION (PLIF) 2 LEVEL  08/08/2016  . NOSE SURGERY    . SHOULDER OPEN ROTATOR CUFF REPAIR Right 04/02/2013   Procedure: ROTATOR CUFF REPAIR SHOULDER OPEN;  Surgeon: Carole Civil, MD;  [facility];  Service: Orthopedics;  Laterality: Right;  . THROAT SURGERY  2000   for throat cancer/no problems intubation...[facility]  . TONSILLECTOMY    . TUBAL LIGATION      There were no vitals filed for this visit.      Subjective Assessment - 12/16/16 1440    Subjective Patient reports L sided low back pain. Her pain is rated 7/10 today.   Pertinent History  Fibromyalgia; previos neck and low back surgeries.   How long can you sit comfortably? 10 minutes.   How long can you stand comfortably? 5-10 minutes.   Diagnostic tests MRI.   Patient Stated Goals Reduce pain and be able to get some quality of life back.   Currently in Pain? Yes   Pain Score 7    Pain Location Hip   Pain Orientation Left   Pain Descriptors / Indicators Aching   Pain Type Chronic pain                         [facility] Adult PT Treatment/Exercise - 12/16/16 0001      Self-Care   Self-Care  Other Self-Care Comments     Exercises   Exercises Knee/Hip     Knee/Hip Exercises: Stretches   ITB Stretch Both;1 rep;60 seconds   Piriformis Stretch Both;1 rep;30 seconds     Modalities   Modalities Moist Heat     Moist Heat Therapy   Number Minutes Moist Heat 15 Minutes   Moist Heat Location --  gluteals     Manual Therapy   Manual Therapy Myofascial release;Soft tissue mobilization   Soft tissue mobilization to B gluteals, piriformis          Trigger Point Dry Needling - 12/16/16 1521    Consent Given? Yes   Education Handout Provided Yes   Muscles Treated Upper Body Quadratus Lumborum  L   Muscles Treated Lower Body Gluteus minimus;Gluteus maximus;Piriformis  Bil   Gluteus Maximus Response Twitch response elicited;Palpable increased muscle length   Gluteus Minimus Response Twitch response elicited;Palpable increased muscle length   Piriformis Response Twitch response elicited;Palpable increased muscle length              PT Education - 12/16/16 1520    Education provided Yes   Education Details HEP; DN education and aftercare   Person(s) Educated Patient   Methods Explanation;Demonstration;Handout   Comprehension Verbalized understanding;Returned demonstration          PT Short Term Goals - 11/04/16 1959      PT SHORT TERM GOAL #1   Title STG's=LTG's.           PT Long Term Goals - 11/04/16 2000      PT LONG TERM GOAL #1   Title Indepenent with a HEP.   Time 8   Period Weeks   Status New     PT LONG TERM GOAL #2   Title Eliminate left LE symptoms.   Time 8   Period Weeks   Status New     PT LONG TERM GOAL #3   Title Perform ADL's with pain not > 3/10.   Time 8   Period Weeks   Status New               Plan - 12/16/16 1524    Clinical Impression Statement Patient did well with TPDN. She had trigger points in B gluteals, R > L, however L QL was tight vs R and released after DN. Patient was issued stretches for her  gluteals and lateral rotators.    PT Treatment/Interventions ADLs/Self Care Home Management;Cryotherapy;Electrical Stimulation;Ultrasound;Moist Heat;Therapeutic activities;Therapeutic exercise;Patient/family education;Manual techniques;Dry needling   PT Next Visit Plan Assess DN and continue as indicated. Begin with low-level core exercise with no spinal extension past neutral.  STW/M and modalites PRN.   PT Home Exercise Plan SDLY ITB stretch for lateral hip and QL; supine cross body  PF with knee bent      Patient will benefit from skilled therapeutic intervention in order to improve the following deficits and impairments:  Pain, Decreased activity tolerance  Visit Diagnosis: Chronic left-sided low back pain, with sciatica presence unspecified     Problem List Patient Active Problem List   Diagnosis Date Noted  . Lumbar adjacent segment disease with spondylolisthesis 08/08/2016  . RLQ abdominal pain 04/11/2015  . Hot flashes 04/11/2015  . Short-segment Barrett's esophagus 02/28/2015  . Peripheral arterial disease ([place]) 12/13/2013  . Status post rotator cuff repair 04/15/2013  . Rotator cuff tear 11/26/2012  . Rotator cuff syndrome of right shoulder 10/27/2012  . Right shoulder pain 10/27/2012  . HELICOBACTER PYLORI [place] INFECTION 11/03/2009  . DEPRESSION 11/02/2009  . RESTLESS LEG SYNDROME 11/02/2009  . GASTROESOPHAGEAL REFLUX DISEASE, CHRONIC 11/02/2009  . CONSTIPATION 11/02/2009  . IRRITABLE BOWEL SYNDROME 11/02/2009  . ARTHRITIS 11/02/2009  . BACK PAIN, CHRONIC 11/02/2009  . NAUSEA 11/02/2009  . DIARRHEA 11/02/2009  . ABDOMINAL PAIN 11/02/2009  . RECTAL BLEEDING, HX OF 11/02/2009    Madelyn Flavors PT 12/16/2016, 3:36 PM  [facility] [place], [place], [address] Phone: [phone]   Fax:  [phone]  Name: CAMBRIA OSTEN MRN: [id number] Date of Birth: Dec 06, 1965

## 2016-12-17 ENCOUNTER — Telehealth: Payer: Self-pay | Admitting: Orthopedic Surgery

## 2016-12-17 NOTE — Telephone Encounter (Signed)
Pt wants to reschedule surgery.  Please call her

## 2016-12-19 ENCOUNTER — Ambulatory Visit: Payer: Medicare Other | Admitting: Physical Therapy

## 2016-12-19 DIAGNOSIS — M545 Low back pain: Principal | ICD-10-CM

## 2016-12-19 DIAGNOSIS — G8929 Other chronic pain: Secondary | ICD-10-CM

## 2016-12-19 NOTE — Therapy (Addendum)
[facility] [place], [place], [address] Phone: [phone]   Fax:  [phone]  Physical Therapy Treatment  Patient Details  Name: [name] MRN: [id number] Date of Birth: [birth year] Referring Provider: Newman Pies MD.  Encounter Date: 12/19/2016    Past Medical History:  Diagnosis Date  . ADHD (attention deficit hyperactivity disorder)   . Anxiety   . Arthritis    disk disease  . Barrett esophagus   . Chronic pain   . COPD (chronic obstructive pulmonary disease) ([place])   . Depression   . Dyspnea   . Fibromyalgia   . GERD (gastroesophageal reflux disease)    IBS  . Headache   . Hot flashes 04/11/2015  . IBS (irritable bowel syndrome)   . Obesity   . Peripheral arterial disease ([place])   . Plantar fasciitis of left foot   . PTSD (post-traumatic stress disorder)   . RLQ abdominal pain 04/11/2015  . Vocal cord cancer [facility])    surgery corrected the problem     Past Surgical History:  Procedure Laterality Date  . ANTERIOR CERVICAL DECOMP/DISCECTOMY FUSION  08/18/2012   Procedure: ANTERIOR CERVICAL DECOMPRESSION/DISCECTOMY FUSION 2 LEVELS;  Surgeon: Faythe Ghee, MD;  Location: [place] [facility];  Service: Neurosurgery;  Laterality: N/A;  anterior cervical five-six,six-seven decompression fusion plating  . ANTERIOR LAT LUMBAR FUSION  03/19/2012   Procedure: ANTERIOR LATERAL LUMBAR FUSION 1 LEVEL;  Surgeon: Faythe Ghee, MD;  Location: [place] [facility];  Service: Neurosurgery;  Laterality: Right;  Anterolateral Lumbar Interbody Fusion, Lumbar Three-Four (Hand broke through glove and nat in the room)  . BACK SURGERY    . BUNIONECTOMY Left 08/04/2014   Procedure: LEFT FOOT LAPIDUS BUNION CORRECTION, LEFT SECOND TARSALMETATARSAL JOINT DEBRIDEMENT AND LEFT MODIFIED MCBRIDE BUNIONECTOMY;  Surgeon: [name], MD;  Location: [place];  Service: Orthopedics;  Laterality: Left;  . CARPAL TUNNEL RELEASE  Bilateral   . CHONDROPLASTY Left 12/25/2016   Procedure: CHONDROPLASTY LEFT PATELLA;  Surgeon: Carole Civil, MD;  [facility];  Service: Orthopedics;  Laterality: Left;  . CLEFT PALATE REPAIR    . COLONOSCOPY N/A 02/16/2015   Procedure: COLONOSCOPY;  Surgeon: Rogene Houston, MD;  [facility];  Service: Endoscopy;  Laterality: N/A;  155  . ENDOMETRIAL ABLATION    . ESOPHAGOGASTRODUODENOSCOPY N/A 02/16/2015   Procedure: ESOPHAGOGASTRODUODENOSCOPY (EGD);  Surgeon: Rogene Houston, MD;  [facility];  Service: Endoscopy;  Laterality: N/A;  . KNEE ARTHROSCOPY WITH MEDIAL MENISECTOMY Left 12/25/2016   Procedure: KNEE ARTHROSCOPY WITH MEDIAL AND LATERAL MENISECTOMY;  Surgeon: Carole Civil, MD;  [facility];  Service: Orthopedics;  Laterality: Left;  . LAPAROSCOPIC TUBAL LIGATION     [facility]  . MAXIMUM ACCESS (MAS)POSTERIOR LUMBAR INTERBODY FUSION (PLIF) 2 LEVEL  08/08/2016  . NOSE SURGERY    . SHOULDER OPEN ROTATOR CUFF REPAIR Right 04/02/2013   Procedure: ROTATOR CUFF REPAIR SHOULDER OPEN;  Surgeon: Carole Civil, MD;  [facility];  Service: Orthopedics;  Laterality: Right;  . THROAT SURGERY  2000   for throat cancer/no problems intubation...[facility]  . TONSILLECTOMY    . TUBAL LIGATION      There were no vitals filed for this visit.                                 PT Short Term Goals - 11/04/16 1959  PT SHORT TERM GOAL #1   Title STG's=LTG's.           PT Long Term Goals - 11/04/16 2000      PT LONG TERM GOAL #1   Title Indepenent with a HEP.   Time 8   Period Weeks   Status New     PT LONG TERM GOAL #2   Title Eliminate left LE symptoms.   Time 8   Period Weeks   Status New     PT LONG TERM GOAL #3   Title Perform ADL's with pain not > 3/10.   Time 8   Period Weeks   Status New             Patient will benefit from skilled therapeutic intervention in order to  improve the following deficits and impairments:     Visit Diagnosis: Chronic left-sided low back pain, with sciatica presence unspecified     Problem List Patient Active Problem List   Diagnosis Date Noted  . Derangement of posterior horn of medial meniscus of left knee   . Lateral meniscus, anterior horn derangement, left   . Chondromalacia of patellofemoral joint, left   . Lumbar adjacent segment disease with spondylolisthesis 08/08/2016  . RLQ abdominal pain 04/11/2015  . Hot flashes 04/11/2015  . Short-segment Barrett's esophagus 02/28/2015  . Peripheral arterial disease ([place]) 12/13/2013  . Status post rotator cuff repair 04/15/2013  . Rotator cuff tear 11/26/2012  . Rotator cuff syndrome of right shoulder 10/27/2012  . Right shoulder pain 10/27/2012  . HELICOBACTER PYLORI [place] INFECTION 11/03/2009  . DEPRESSION 11/02/2009  . RESTLESS LEG SYNDROME 11/02/2009  . GASTROESOPHAGEAL REFLUX DISEASE, CHRONIC 11/02/2009  . CONSTIPATION 11/02/2009  . IRRITABLE BOWEL SYNDROME 11/02/2009  . ARTHRITIS 11/02/2009  . BACK PAIN, CHRONIC 11/02/2009  . NAUSEA 11/02/2009  . DIARRHEA 11/02/2009  . ABDOMINAL PAIN 11/02/2009  . RECTAL BLEEDING, HX OF 11/02/2009   PHYSICAL THERAPY DISCHARGE SUMMARY  Visits from Start of Care:  Current functional level related to goals / functional outcomes: See above.   Remaining deficits: Continued c/o low back pain and left LE pain.   Education / Equipment: HEP. Plan: Patient agrees to discharge.  Patient goals were not met. Patient is being discharged due to not returning since the last visit.  ?????      [name], [place]  MPT 05/06/2017, 1:51 PM  [facility] [address] [place], [place], [address] Phone: [phone]   Fax:  [phone]  Name: [name] MRN: [id number] Date of Birth: 1966-02-01

## 2016-12-19 NOTE — Telephone Encounter (Signed)
SURGERY SCHEDULED °

## 2016-12-20 ENCOUNTER — Encounter (HOSPITAL_COMMUNITY): Payer: Self-pay

## 2016-12-20 ENCOUNTER — Other Ambulatory Visit: Payer: Self-pay | Admitting: *Deleted

## 2016-12-24 ENCOUNTER — Encounter (HOSPITAL_COMMUNITY)
Admission: RE | Admit: 2016-12-24 | Discharge: 2016-12-24 | Disposition: A | Discharge status: Home / Self Care | Payer: Medicare Other | Source: Ambulatory Visit | Attending: Orthopedic Surgery | Admitting: Orthopedic Surgery

## 2016-12-24 NOTE — H&P (Signed)
Preop history and physical   Chief Complaint  Patient presents with  . Left knee pain            HPI [name] is a 51 y.o. female.     51 year old female presents with pain swelling giving way symptoms of her left knee with pain over the medial joint line no history of trauma but long-standing history of pain over the medial joint. She did get cortisone injections. She's has a recent lumbar fusion relieved a lot of her back and hip pain and some of her leg pain but the giving way has persisted   MRI today was reviewed with the patient and we are proceeding with surgery          Past Medical History:  Diagnosis Date  . ADHD (attention deficit hyperactivity disorder)    . Anxiety    . Arthritis      disk disease  . Barrett esophagus    . Chronic pain    . COPD (chronic obstructive pulmonary disease) ([place])    . Depression    . Dyspnea    . Fibromyalgia    . GERD (gastroesophageal reflux disease)      IBS  . Headache    . Hot flashes 04/11/2015  . IBS (irritable bowel syndrome)    . Obesity    . Peripheral arterial disease ([place])    . Plantar fasciitis of left foot    . PTSD (post-traumatic stress disorder)    . RLQ abdominal pain 04/11/2015  . 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  Respiratory: Negative for shortness of breath.   Cardiovascular: Negative for chest pain.  All other systems were also negative. The patient had a skin infection in her right exam. I have seen her arm and axilla and it is normal.     Physical Exam She is awake alert and oriented 3 has a small limp  her mood and affect are normal grooming and hygiene are normal Prior exam showed primarily medial symptoms with tenderness decreased range of motion   Left knee medial joint line was tender knee flexion was full she had a little bit of extension deficit with pain ligaments were stable  including anterior cruciate ligament PCL collateral ligaments at 0 and 30 muscle strength and tone were normal no effusion was noted skin was intact   We note that her upper extremities look normal. She has full range of motion, all joints are reduced without subluxation there is no atrophy or tremor of the musculature the skin is intact including the right axilla the neurovascular exam is normal.   Data   Independent image interpretation the MRI showed  IMPRESSION: 1. Maceration of the anterior horn of the lateral meniscus with a 7 mm parameniscal cyst adjacent to the root. Increased signal and expansion of the anterior horn- body junction of the medial meniscus likely reflecting a small intrameniscal cyst. 2. Oblique tear of the posterior horn of the medial meniscus extending to the inferior articular surface. 3. Tricompartmental cartilage abnormalities as described above.   I see the arthritis the torn meniscus medially and laterally       Encounter Diagnoses  Name Primary?  . Acute medial meniscus tear of left knee, subsequent encounter Yes  . Complex tear of lateral meniscus of left knee as current injury, subsequent encounter  The plan is to  arthroscopy left knee medial and lateral meniscectomy for medial and lateral meniscus tear with arthritis

## 2016-12-25 ENCOUNTER — Encounter (HOSPITAL_COMMUNITY)
Admission: RE | Disposition: A | Discharge status: Home / Self Care | Payer: Self-pay | Source: Ambulatory Visit | Attending: Orthopedic Surgery

## 2016-12-25 ENCOUNTER — Ambulatory Visit (HOSPITAL_COMMUNITY): Payer: Medicare Other | Admitting: Anesthesiology

## 2016-12-25 ENCOUNTER — Ambulatory Visit (HOSPITAL_COMMUNITY)
Admission: RE | Admit: 2016-12-25 | Discharge: 2016-12-25 | Disposition: A | Discharge status: Home / Self Care | Payer: Medicare Other | Source: Ambulatory Visit | Attending: Orthopedic Surgery | Admitting: Orthopedic Surgery

## 2016-12-25 ENCOUNTER — Encounter (HOSPITAL_COMMUNITY): Payer: Self-pay | Admitting: *Deleted

## 2016-12-25 DIAGNOSIS — M797 Fibromyalgia: Secondary | ICD-10-CM | POA: Insufficient documentation

## 2016-12-25 DIAGNOSIS — E669 Obesity, unspecified: Secondary | ICD-10-CM | POA: Insufficient documentation

## 2016-12-25 DIAGNOSIS — M2242 Chondromalacia patellae, left knee: Secondary | ICD-10-CM

## 2016-12-25 DIAGNOSIS — Z6841 Body Mass Index (BMI) 40.0 and over, adult: Secondary | ICD-10-CM | POA: Insufficient documentation

## 2016-12-25 DIAGNOSIS — F909 Attention-deficit hyperactivity disorder, unspecified type: Secondary | ICD-10-CM | POA: Insufficient documentation

## 2016-12-25 DIAGNOSIS — F419 Anxiety disorder, unspecified: Secondary | ICD-10-CM | POA: Diagnosis not present

## 2016-12-25 DIAGNOSIS — G8929 Other chronic pain: Secondary | ICD-10-CM | POA: Insufficient documentation

## 2016-12-25 DIAGNOSIS — R51 Headache: Secondary | ICD-10-CM | POA: Insufficient documentation

## 2016-12-25 DIAGNOSIS — M23322 Other meniscus derangements, posterior horn of medial meniscus, left knee: Secondary | ICD-10-CM | POA: Diagnosis not present

## 2016-12-25 DIAGNOSIS — F17200 Nicotine dependence, unspecified, uncomplicated: Secondary | ICD-10-CM | POA: Diagnosis not present

## 2016-12-25 DIAGNOSIS — I739 Peripheral vascular disease, unspecified: Secondary | ICD-10-CM | POA: Insufficient documentation

## 2016-12-25 DIAGNOSIS — F4310 Post-traumatic stress disorder, unspecified: Secondary | ICD-10-CM | POA: Insufficient documentation

## 2016-12-25 DIAGNOSIS — M1990 Unspecified osteoarthritis, unspecified site: Secondary | ICD-10-CM | POA: Diagnosis not present

## 2016-12-25 DIAGNOSIS — K219 Gastro-esophageal reflux disease without esophagitis: Secondary | ICD-10-CM | POA: Diagnosis not present

## 2016-12-25 DIAGNOSIS — F329 Major depressive disorder, single episode, unspecified: Secondary | ICD-10-CM | POA: Insufficient documentation

## 2016-12-25 DIAGNOSIS — Z8521 Personal history of malignant neoplasm of larynx: Secondary | ICD-10-CM | POA: Diagnosis not present

## 2016-12-25 DIAGNOSIS — M23242 Derangement of anterior horn of lateral meniscus due to old tear or injury, left knee: Secondary | ICD-10-CM | POA: Diagnosis not present

## 2016-12-25 DIAGNOSIS — M23342 Other meniscus derangements, anterior horn of lateral meniscus, left knee: Secondary | ICD-10-CM | POA: Diagnosis not present

## 2016-12-25 DIAGNOSIS — K589 Irritable bowel syndrome without diarrhea: Secondary | ICD-10-CM | POA: Diagnosis not present

## 2016-12-25 DIAGNOSIS — J449 Chronic obstructive pulmonary disease, unspecified: Secondary | ICD-10-CM | POA: Diagnosis not present

## 2016-12-25 HISTORY — PX: CHONDROPLASTY: SHX5177

## 2016-12-25 HISTORY — PX: KNEE ARTHROSCOPY WITH MEDIAL MENISECTOMY: SHX5651

## 2016-12-25 SURGERY — ARTHROSCOPY, KNEE, WITH MEDIAL MENISCECTOMY
Anesthesia: General | Site: Knee | Laterality: Left

## 2016-12-25 MED ORDER — LACTATED RINGERS IV SOLN
INTRAVENOUS | Status: DC
  Administered 2016-12-25: 1000 mL via INTRAVENOUS

## 2016-12-25 MED ORDER — LIDOCAINE HCL 1 % IJ SOLN
INTRAMUSCULAR | Status: DC | PRN
  Administered 2016-12-25: 50 mg via INTRADERMAL

## 2016-12-25 MED ORDER — MIDAZOLAM HCL 2 MG/2ML IJ SOLN
INTRAMUSCULAR | Status: AC
  Filled 2016-12-25: qty 2

## 2016-12-25 MED ORDER — MIDAZOLAM HCL 2 MG/2ML IJ SOLN
1.0000 mg | INTRAMUSCULAR | Status: AC
  Administered 2016-12-25: 2 mg via INTRAVENOUS

## 2016-12-25 MED ORDER — EPHEDRINE SULFATE 50 MG/ML IJ SOLN
INTRAMUSCULAR | Status: DC | PRN
  Administered 2016-12-25 (×7): 5 mg via INTRAVENOUS

## 2016-12-25 MED ORDER — FENTANYL CITRATE (PF) 100 MCG/2ML IJ SOLN
25.0000 ug | INTRAMUSCULAR | Status: DC | PRN
  Administered 2016-12-25 (×2): 50 ug via INTRAVENOUS
  Administered 2016-12-25: 25 ug via INTRAVENOUS
  Administered 2016-12-25: 50 ug via INTRAVENOUS
  Filled 2016-12-25 (×3): qty 2

## 2016-12-25 MED ORDER — FENTANYL CITRATE (PF) 100 MCG/2ML IJ SOLN
INTRAMUSCULAR | Status: DC | PRN
  Administered 2016-12-25 (×2): 25 ug via INTRAVENOUS
  Administered 2016-12-25: 50 ug via INTRAVENOUS

## 2016-12-25 MED ORDER — CEFAZOLIN SODIUM-DEXTROSE 2-4 GM/100ML-% IV SOLN
2.0000 g | INTRAVENOUS | Status: AC
  Administered 2016-12-25: 2 g via INTRAVENOUS

## 2016-12-25 MED ORDER — SODIUM CHLORIDE 0.9% FLUSH
INTRAVENOUS | Status: AC
  Filled 2016-12-25: qty 10

## 2016-12-25 MED ORDER — BUPIVACAINE-EPINEPHRINE (PF) 0.5% -1:200000 IJ SOLN
INTRAMUSCULAR | Status: DC | PRN
  Administered 2016-12-25: 60 mL

## 2016-12-25 MED ORDER — PROPOFOL 10 MG/ML IV BOLUS
INTRAVENOUS | Status: DC | PRN
  Administered 2016-12-25: 20 mg via INTRAVENOUS
  Administered 2016-12-25: 30 mg via INTRAVENOUS
  Administered 2016-12-25: 150 mg via INTRAVENOUS

## 2016-12-25 MED ORDER — FENTANYL CITRATE (PF) 100 MCG/2ML IJ SOLN
INTRAMUSCULAR | Status: AC
  Filled 2016-12-25: qty 2

## 2016-12-25 MED ORDER — CHLORHEXIDINE GLUCONATE 4 % EX LIQD: 60.0000 mL | Freq: Once | CUTANEOUS | Status: DC

## 2016-12-25 MED ORDER — CEFAZOLIN SODIUM-DEXTROSE 2-4 GM/100ML-% IV SOLN
INTRAVENOUS | Status: AC
  Filled 2016-12-25: qty 100

## 2016-12-25 MED ORDER — EPINEPHRINE PF 1 MG/ML IJ SOLN
INTRAMUSCULAR | Status: AC
Start: 2016-12-25 — End: ?
  Filled 2016-12-25: qty 3

## 2016-12-25 MED ORDER — MIDAZOLAM HCL 2 MG/2ML IJ SOLN
INTRAMUSCULAR | Status: DC | PRN
  Administered 2016-12-25 (×2): 1 mg via INTRAVENOUS

## 2016-12-25 MED ORDER — ONDANSETRON HCL 4 MG/2ML IJ SOLN
4.0000 mg | Freq: Once | INTRAMUSCULAR | Status: AC
  Administered 2016-12-25: 4 mg via INTRAVENOUS

## 2016-12-25 MED ORDER — EPHEDRINE SULFATE 50 MG/ML IJ SOLN
INTRAMUSCULAR | Status: AC
  Filled 2016-12-25: qty 1

## 2016-12-25 MED ORDER — EPINEPHRINE PF 1 MG/ML IJ SOLN
INTRAMUSCULAR | Status: DC | PRN
  Administered 2016-12-25 (×4): 3000 mL

## 2016-12-25 MED ORDER — LIDOCAINE HCL (PF) 1 % IJ SOLN
INTRAMUSCULAR | Status: AC
  Filled 2016-12-25: qty 5

## 2016-12-25 MED ORDER — SODIUM CHLORIDE 0.9 % IJ SOLN
INTRAMUSCULAR | Status: AC
  Filled 2016-12-25: qty 10

## 2016-12-25 MED ORDER — PROPOFOL 10 MG/ML IV BOLUS
INTRAVENOUS | Status: AC
  Filled 2016-12-25: qty 20

## 2016-12-25 MED ORDER — ONDANSETRON HCL 4 MG/2ML IJ SOLN
INTRAMUSCULAR | Status: AC
  Filled 2016-12-25: qty 2

## 2016-12-25 MED ORDER — EPINEPHRINE PF 1 MG/ML IJ SOLN
INTRAMUSCULAR | Status: AC
  Filled 2016-12-25: qty 1

## 2016-12-25 SURGICAL SUPPLY — 49 items
BAG HAMPER (MISCELLANEOUS) ×3 IMPLANT
BANDAGE ELASTIC 6 LF NS (GAUZE/BANDAGES/DRESSINGS) ×3 IMPLANT
BLADE AGGRESSIVE PLUS 4.0 (BLADE) ×3 IMPLANT
BLADE SURG SZ11 CARB STEEL (BLADE) ×3 IMPLANT
BNDG CMPR MED 5X6 ELC HKLP NS (GAUZE/BANDAGES/DRESSINGS) ×1
CHLORAPREP W/TINT 26ML (MISCELLANEOUS) ×3 IMPLANT
CLOTH BEACON ORANGE TIMEOUT ST (SAFETY) ×3 IMPLANT
COOLER CRYO IC GRAV AND TUBE (ORTHOPEDIC SUPPLIES) ×3 IMPLANT
CUFF CRYO KNEE LG 20X31 COOLER (ORTHOPEDIC SUPPLIES) ×2 IMPLANT
CUFF TOURNIQUET SINGLE 34IN LL (TOURNIQUET CUFF) ×2 IMPLANT
DECANTER SPIKE VIAL GLASS SM (MISCELLANEOUS) ×6 IMPLANT
GAUZE SPONGE 4X4 12PLY STRL (GAUZE/BANDAGES/DRESSINGS) ×3 IMPLANT
GAUZE SPONGE 4X4 16PLY XRAY LF (GAUZE/BANDAGES/DRESSINGS) ×3 IMPLANT
GAUZE XEROFORM 5X9 LF (GAUZE/BANDAGES/DRESSINGS) ×3 IMPLANT
GLOVE BIOGEL PI IND STRL 7.0 (GLOVE) ×1 IMPLANT
GLOVE BIOGEL PI INDICATOR 7.0 (GLOVE) ×2
GLOVE SKINSENSE NS SZ8.0 LF (GLOVE) ×2
GLOVE SKINSENSE STRL SZ8.0 LF (GLOVE) ×1 IMPLANT
GLOVE SS N UNI LF 8.5 STRL (GLOVE) ×3 IMPLANT
GOWN STRL REUS W/ TWL LRG LVL3 (GOWN DISPOSABLE) ×1 IMPLANT
GOWN STRL REUS W/TWL LRG LVL3 (GOWN DISPOSABLE) ×3
GOWN STRL REUS W/TWL XL LVL3 (GOWN DISPOSABLE) ×3 IMPLANT
HLDR LEG FOAM (MISCELLANEOUS) ×1 IMPLANT
IV NS IRRIG 3000ML ARTHROMATIC (IV SOLUTION) ×10 IMPLANT
KIT BLADEGUARD II DBL (SET/KITS/TRAYS/PACK) ×3 IMPLANT
KIT ROOM TURNOVER AP CYSTO (KITS) ×3 IMPLANT
LEG HOLDER FOAM (MISCELLANEOUS) ×2
MANIFOLD NEPTUNE II (INSTRUMENTS) ×3 IMPLANT
MARKER SKIN DUAL TIP RULER LAB (MISCELLANEOUS) ×3 IMPLANT
NDL HYPO 18GX1.5 BLUNT FILL (NEEDLE) ×1 IMPLANT
NDL HYPO 21X1.5 SAFETY (NEEDLE) ×1 IMPLANT
NDL SPNL 18GX3.5 QUINCKE PK (NEEDLE) ×1 IMPLANT
NEEDLE HYPO 18GX1.5 BLUNT FILL (NEEDLE) ×3 IMPLANT
NEEDLE HYPO 21X1.5 SAFETY (NEEDLE) ×3 IMPLANT
NEEDLE SPNL 18GX3.5 QUINCKE PK (NEEDLE) ×3 IMPLANT
PACK ARTHRO LIMB DRAPE STRL (MISCELLANEOUS) ×3 IMPLANT
PAD ABD 5X9 TENDERSORB (GAUZE/BANDAGES/DRESSINGS) ×3 IMPLANT
PAD ARMBOARD 7.5X6 YLW CONV (MISCELLANEOUS) ×3 IMPLANT
PADDING CAST COTTON 6X4 STRL (CAST SUPPLIES) ×3 IMPLANT
PADDING WEBRIL 6 STERILE (GAUZE/BANDAGES/DRESSINGS) ×2 IMPLANT
PROBE BIPOLAR ATHRO 135MM 90D (MISCELLANEOUS) ×2 IMPLANT
SET ARTHROSCOPY INST (INSTRUMENTS) ×3 IMPLANT
SET ARTHROSCOPY PUMP TUBE (IRRIGATION / IRRIGATOR) ×3 IMPLANT
SET BASIN LINEN APH (SET/KITS/TRAYS/PACK) ×3 IMPLANT
SUT ETHILON 3 0 FSL (SUTURE) ×3 IMPLANT
SYR 30ML LL (SYRINGE) ×3 IMPLANT
SYRINGE 10CC LL (SYRINGE) ×3 IMPLANT
TUBE CONNECTING 12'X1/4 (SUCTIONS) ×4
TUBE CONNECTING 12X1/4 (SUCTIONS) ×7 IMPLANT

## 2016-12-25 NOTE — Op Note (Signed)
12/25/2016  10:12 AM  PATIENT:  [name]  51 y.o. female  PRE-OPERATIVE DIAGNOSIS:  LEFT KNEE MEDIAL AND LATERAL MENISCUS TEARS  POST-OPERATIVE DIAGNOSIS:  LEFT KNEE MEDIAL AND LATERAL MENISCUS TEARS, CHONDROMALACIA LEFT PATELLA, LATERAL FACET   PROCEDURE:  Procedure(s): KNEE ARTHROSCOPY WITH MEDIAL AND LATERAL MENISECTOMY (Left) CHONDROPLASTY LEFT PATELLA (Left)  Knee arthroscopy dictation  The patient was identified in the preoperative holding area using 2 approved identification mechanisms. The chart was reviewed and updated. The surgical site was confirmed as LEFT  knee and marked with an indelible marker.  The patient was taken to the operating room for anesthesia. After successful  GENERAL anesthesia, WE STARTED  IV antibiotics.  The patient was placed in the supine position with the (LEFT) the operative extremity in an arthroscopic leg holder and the opposite extremity in a padded leg holder.  The timeout was executed.  A lateral portal was established with an 11 blade and the scope was introduced into the joint. A diagnostic arthroscopy was performed in circumferential manner examining the entire knee joint. A medial portal was established and the diagnostic arthroscopy was repeated using a probe to palpate intra-articular structures as they were encountered.   The operative findings are   Medial SMALL AREA OF THINNING OF POSTERIOR HORN OF THE MEDIAL MENISCUS , ARTICULAR CARTILAGE NORMAL  Lateral anterior horn tear lateral meniscus with lateral meniscal cyst and a portion of the body of the lateral meniscus was also torn, the articular surfaces were normal Patellofemoral trochlear area was normal the lateral facet had grade 2-3 chondromalacia Notch normal anterior cruciate ligament and PCL   The medial meniscus was very thin at the posterior horn the probe practically went through the meniscus therefore I resected the area of thinning using a duckbill forceps. The  meniscal fragments were removed with a motorized shaver. The meniscus was balanced with a A motorized shaver, the remaining portion of the meniscus was very stable   I then turned my attention to the lateral meniscus and used a 90 wand from [employer]. A shaver was used to balance the meniscus and a probe was used to confirm stability of the remaining rim. I removed approximately 20% of the lateral meniscus primarily at the body and anterior horn. I also removed a meniscal cyst.  The arthroscopic pump was placed on the wash mode and any excess debris was removed from the joint using suction.  60 cc of Marcaine with epinephrine was injected through the arthroscope.  The portals were closed with 3-0 nylon suture.  A sterile bandage, Ace wrap and Cryo/Cuff was placed and the Cryo/Cuff was activated. The patient was taken to the recovery room in stable condition.  SURGEON:  Surgeon(s) and Role:    * Carole Civil, MD - Primary  PHYSICIAN ASSISTANT:   ASSISTANTS: none   ANESTHESIA:   general  EBL:  Total I/O In: 900 [I.V.:900] Out: 0   BLOOD ADMINISTERED:none  DRAINS: none   LOCAL MEDICATIONS USED:  MARCAINE     SPECIMEN:  No Specimen  DISPOSITION OF SPECIMEN:  N/A  COUNTS:  YES  TOURNIQUET:    DICTATION: .Dragon Dictation  PLAN OF CARE: Discharge to home after PACU  PATIENT DISPOSITION:  PACU - hemodynamically stable.   Delay start of Pharmacological VTE agent (>24hrs) due to surgical blood loss or risk of bleeding: yes  [id number]

## 2016-12-25 NOTE — Brief Op Note (Addendum)
12/25/2016  10:12 AM  PATIENT:  [name]  51 y.o. female  PRE-OPERATIVE DIAGNOSIS:  LEFT KNEE MEDIAL AND LATERAL MENISCUS TEARS  POST-OPERATIVE DIAGNOSIS:  LEFT KNEE MEDIAL AND LATERAL MENISCUS TEARS, CHONDROMALACIA LEFT PATELLA, LATERAL FACET   PROCEDURE:  Procedure(s): KNEE ARTHROSCOPY WITH MEDIAL AND LATERAL MENISECTOMY (Left) CHONDROPLASTY LEFT PATELLA (Left)  Knee arthroscopy dictation  The patient was identified in the preoperative holding area using 2 approved identification mechanisms. The chart was reviewed and updated. The surgical site was confirmed as LEFT  knee and marked with an indelible marker.  The patient was taken to the operating room for anesthesia. After successful  GENERAL anesthesia, WE STARTED  IV antibiotics.  The patient was placed in the supine position with the (LEFT) the operative extremity in an arthroscopic leg holder and the opposite extremity in a padded leg holder.  The timeout was executed.  A lateral portal was established with an 11 blade and the scope was introduced into the joint. A diagnostic arthroscopy was performed in circumferential manner examining the entire knee joint. A medial portal was established and the diagnostic arthroscopy was repeated using a probe to palpate intra-articular structures as they were encountered.   The operative findings are   Medial SMALL AREA OF THINNING OF POSTERIOR HORN OF THE MEDIAL MENISCUS , ARTICULAR CARTILAGE NORMAL  Lateral anterior horn tear lateral meniscus with lateral meniscal cyst and a portion of the body of the lateral meniscus was also torn, the articular surfaces were normal Patellofemoral trochlear area was normal the lateral facet had grade 2-3 chondromalacia Notch normal anterior cruciate ligament and PCL   The medial meniscus was very thin at the posterior horn the probe practically went through the meniscus therefore I resected the area of thinning using a duckbill forceps. The  meniscal fragments were removed with a motorized shaver. The meniscus was balanced with a A motorized shaver, the remaining portion of the meniscus was very stable   I then turned my attention to the lateral meniscus and used a 90 wand from [employer]. A shaver was used to balance the meniscus and a probe was used to confirm stability of the remaining rim. I removed approximately 20% of the lateral meniscus primarily at the body and anterior horn. I also removed a meniscal cyst.   FINALLY, A CHONDROPLASTY WAS PERFORMED ON THE LATERAL FACET OF THE PATELLA USING A MOTORIZED SHAVER FROM THE LATERAL  PORTAL  The arthroscopic pump was placed on the wash mode and any excess debris was removed from the joint using suction.  60 cc of Marcaine with epinephrine was injected through the arthroscope.  The portals were closed with 3-0 nylon suture.  A sterile bandage, Ace wrap and Cryo/Cuff was placed and the Cryo/Cuff was activated. The patient was taken to the recovery room in stable condition.  SURGEON:  Surgeon(s) and Role:    * Carole Civil, MD - Primary  PHYSICIAN ASSISTANT:   ASSISTANTS: none   ANESTHESIA:   general  EBL:  Total I/O In: 900 [I.V.:900] Out: 0   BLOOD ADMINISTERED:none  DRAINS: none   LOCAL MEDICATIONS USED:  MARCAINE     SPECIMEN:  No Specimen  DISPOSITION OF SPECIMEN:  N/A  COUNTS:  YES  TOURNIQUET:    DICTATION: .Dragon Dictation  PLAN OF CARE: Discharge to home after PACU  PATIENT DISPOSITION:  PACU - hemodynamically stable.   Delay start of Pharmacological VTE agent (>24hrs) due to surgical blood loss or risk of bleeding:  yes  [phone]

## 2016-12-25 NOTE — Anesthesia Preprocedure Evaluation (Addendum)
Anesthesia Evaluation  Patient identified by MRN, date of birth, ID band Patient awake    Reviewed: Allergy & Precautions, H&P , NPO status , Patient's Chart, lab work & pertinent test results  Airway Mallampati: I  TM Distance: >3 FB Neck ROM: Full   Comment: Raspy voice  Dental no notable dental hx.    Pulmonary shortness of breath and with exertion, COPD, Current Smoker,    Pulmonary exam normal        Cardiovascular + DOE  negative cardio ROS   Rhythm:Regular Rate:Normal     Neuro/Psych  Headaches, PSYCHIATRIC DISORDERS (ADHD , PTSD) Anxiety Depression    GI/Hepatic Neg liver ROS, GERD  Medicated and Controlled,  Endo/Other  Morbid obesity  Renal/GU negative Renal ROS     Musculoskeletal negative musculoskeletal ROS (+) Arthritis , Fibromyalgia -  Abdominal (+) + obese,  Abdomen: soft.    Peds  Hematology negative hematology ROS (+)   Anesthesia Other Findings Hx Vocal cord cancer, s/p excision.  Reproductive/Obstetrics negative OB ROS                            Anesthesia Physical Anesthesia Plan  ASA: III  Anesthesia Plan: General   Post-op Pain Management:    Induction: Intravenous  Airway Management Planned: LMA  Additional Equipment:   Intra-op Plan:   Post-operative Plan: Extubation in OR  Informed Consent: I have reviewed the patients History and Physical, chart, labs and discussed the procedure including the risks, benefits and alternatives for the proposed anesthesia with the patient or authorized representative who has indicated his/her understanding and acceptance.   Dental advisory given  Plan Discussed with: CRNA  Anesthesia Plan Comments:        Anesthesia Quick Evaluation

## 2016-12-25 NOTE — Transfer of Care (Signed)
Immediate Anesthesia Transfer of Care Note  Patient: [name]  Procedure(s) Performed: Procedure(s): KNEE ARTHROSCOPY WITH MEDIAL AND LATERAL MENISECTOMY (Left) CHONDROPLASTY LEFT PATELLA (Left)  Patient Location: PACU  Anesthesia Type:General  Level of Consciousness: awake and patient cooperative  Airway & Oxygen Therapy: Patient Spontanous Breathing and Patient connected to nasal cannula oxygen  Post-op Assessment: Report given to RN and Post -op Vital signs reviewed and stable  Post vital signs: Reviewed and stable  Last Vitals:  Vitals:   12/25/16 0845 12/25/16 0850  BP: (!) 110/50 90/68  Pulse:    Resp: 15 (!) 22  Temp:      Last Pain:  Vitals:   12/25/16 0835  TempSrc: Oral  PainSc: 9       Patients Stated Pain Goal: 9 (49/61/16 4353)  Complications: No apparent anesthesia complications

## 2016-12-25 NOTE — Anesthesia Postprocedure Evaluation (Signed)
Anesthesia Post Note  Patient: [name]  Procedure(s) Performed: Procedure(s) (LRB): KNEE ARTHROSCOPY WITH MEDIAL AND LATERAL MENISECTOMY (Left) CHONDROPLASTY LEFT PATELLA (Left)  Patient location during evaluation: PACU Anesthesia Type: General Level of consciousness: awake Pain management: satisfactory to patient Vital Signs Assessment: post-procedure vital signs reviewed and stable Respiratory status: spontaneous breathing and non-rebreather facemask Cardiovascular status: stable Anesthetic complications: no     Last Vitals:  Vitals:   12/25/16 1100 12/25/16 1102  BP: (!) 100/55 (!) 100/55  Pulse: (!) 57 61  Resp: 13 (!) 21  Temp:      Last Pain:  Vitals:   12/25/16 1036  TempSrc:   PainSc: Asleep                 Donevin Sainsbury

## 2016-12-25 NOTE — Anesthesia Procedure Notes (Signed)
Procedure Name: LMA Insertion Date/Time: 12/25/2016 9:03 AM Performed by: Vista Deck Pre-anesthesia Checklist: Patient identified, Patient being monitored, Emergency Drugs available, Timeout performed and Suction available Patient Re-evaluated:Patient Re-evaluated prior to inductionOxygen Delivery Method: Circle System Utilized Preoxygenation: Pre-oxygenation with 100% oxygen Intubation Type: IV induction Ventilation: Mask ventilation without difficulty LMA: LMA inserted LMA Size: 4.0 Number of attempts: 1 Placement Confirmation: positive ETCO2 and breath sounds checked- equal and bilateral Tube secured with: Tape Dental Injury: Teeth and Oropharynx as per pre-operative assessment

## 2016-12-25 NOTE — Interval H&P Note (Signed)
History and Physical Interval Note:  12/25/2016 8:39 AM  [name]  has presented today for surgery, with the diagnosis of LEFT MEDIAL AND LATERAL MENISCUS TEARS  The various methods of treatment have been discussed with the patient and family. After consideration of risks, benefits and other options for treatment, the patient has consented to  Procedure(s): KNEE ARTHROSCOPY WITH MEDIAL AND LATERAL MENISECTOMY (Left) as a surgical intervention .  The patient's history has been reviewed, patient examined, no change in status, stable for surgery.  I have reviewed the patient's chart and labs.  Questions were answered to the patient's satisfaction.     Arther Abbott

## 2016-12-26 ENCOUNTER — Encounter (HOSPITAL_COMMUNITY): Payer: Self-pay | Admitting: Orthopedic Surgery

## 2016-12-31 ENCOUNTER — Ambulatory Visit (INDEPENDENT_AMBULATORY_CARE_PROVIDER_SITE_OTHER): Payer: Self-pay | Admitting: Orthopedic Surgery

## 2016-12-31 ENCOUNTER — Encounter: Payer: Self-pay | Admitting: Orthopedic Surgery

## 2016-12-31 DIAGNOSIS — Z9889 Other specified postprocedural states: Secondary | ICD-10-CM

## 2016-12-31 DIAGNOSIS — Z4889 Encounter for other specified surgical aftercare: Secondary | ICD-10-CM

## 2016-12-31 NOTE — Progress Notes (Signed)
Postop visit #1 status post knee arthroscopy  Chief Complaint  Patient presents with  . Follow-up    SALK LM MM, DOS 12/25/16     51 year old had a left knee arthroscopy we did a partial medial and partial lateral meniscectomy and a chondroplasty of her patella  She's doing well she is ambulating with no assistive devices she doesn't have a limp she's regained full range of motion she has no swelling      No outpatient prescriptions have been marked as taking for the 12/31/16 encounter (Office Visit) with Carole Civil, MD.     The portal sites are clean dry and intact, the sutures were removed.  The calf was supple soft and the Homans sign was negative.  Physical therapy will be started AT HOME   Status post knee arthroscopy  Follow-up PRN

## 2016-12-31 NOTE — Patient Instructions (Addendum)
HOME EXERCISES

## 2017-01-03 NOTE — Addendum Note (Signed)
Addendum  created 01/03/17 0904 by Yuka Lallier, MD   Sign clinical note    

## 2017-01-04 ENCOUNTER — Other Ambulatory Visit: Payer: Self-pay | Admitting: Orthopedic Surgery

## 2017-01-16 ENCOUNTER — Encounter (INDEPENDENT_AMBULATORY_CARE_PROVIDER_SITE_OTHER): Payer: Self-pay | Admitting: Internal Medicine

## 2017-02-03 ENCOUNTER — Encounter (INDEPENDENT_AMBULATORY_CARE_PROVIDER_SITE_OTHER): Payer: Self-pay | Admitting: Internal Medicine

## 2017-02-03 ENCOUNTER — Ambulatory Visit (INDEPENDENT_AMBULATORY_CARE_PROVIDER_SITE_OTHER): Payer: Medicare Other | Admitting: Internal Medicine

## 2017-02-03 VITALS — BP 130/80 | HR 68 | Temp 98.0°F | Ht 65.5 in | Wt 264.4 lb

## 2017-02-03 DIAGNOSIS — K580 Irritable bowel syndrome with diarrhea: Secondary | ICD-10-CM | POA: Diagnosis not present

## 2017-02-03 DIAGNOSIS — K219 Gastro-esophageal reflux disease without esophagitis: Secondary | ICD-10-CM | POA: Diagnosis not present

## 2017-02-03 MED ORDER — HYOSCYAMINE SULFATE 0.125 MG SL SUBL: 0.1250 mg | SUBLINGUAL_TABLET | SUBLINGUAL | 3 refills | Status: DC | PRN

## 2017-02-03 NOTE — Patient Instructions (Signed)
Rx for Levsin sent to her pharmacy. OV in 1 year.

## 2017-02-03 NOTE — Progress Notes (Signed)
Subjective:    Patient ID: [name], female    DOB: 09/17/1965, 51 y.o.   MRN: [id number] 01/2016 wt 258 HPI  Here today for f/u. She was last seen in July of 2017./ Hx  Chronic GERD. Her acid reflux is controlled with Protonix. Her appetite is good. No weight loss. She is having a BM daily.  She c/o irritable bowel. When she eats she will have IBS during the night and will have diarrhea. Requesting Rx for Levsin.  IBS about once a month.  She denies melena or BRRB     Hx of colon cancer in an uncle who was diagnosed in his 20s.  She underwent a colonoscopy/EGD in 2016.  (Please see below).     02/24/2015 EGD & Colonoscopy  Indications: Patient is 51 year old Caucasian female was chronic GERD and presently on double dose PPI and still having breakthrough symptoms. She is undergoing diagnostic EGD followed by screening colonoscopy. Family history is significant for colon carcinoma in paternal uncle who was in his 15s the time of diagnosis.  Impression:  EGD findings: Small sliding hiatal hernia with short tongue of salmon colored mucosa at distal esophagus. Was biopsied for routine histology. No evidence of erosive esophagitis, gastritis or peptic ulcer disease.  Colonoscopy findings: Examination performed to cecum. No evidence of colonic polyps or other abnormalities but examination somewhat suboptimal because of quality of prep. Small external hemorrhoids.    Review of Systems Past Medical History:  Diagnosis Date  . ADHD (attention deficit hyperactivity disorder)   . Anxiety   . Arthritis    disk disease  . Barrett esophagus   . Chronic pain   . COPD (chronic obstructive pulmonary disease) ([place])   . Depression   . Dyspnea   . Fibromyalgia   . GERD (gastroesophageal reflux disease)    IBS  . Headache   . Hot flashes 04/11/2015  . IBS  (irritable bowel syndrome)   . Obesity   . Peripheral arterial disease ([place])   . Plantar fasciitis of left foot   . PTSD (post-traumatic stress disorder)   . RLQ abdominal pain 04/11/2015  . Vocal cord cancer [facility])    surgery corrected the problem     Past Surgical History:  Procedure Laterality Date  . ANTERIOR CERVICAL DECOMP/DISCECTOMY FUSION  08/18/2012   Procedure: ANTERIOR CERVICAL DECOMPRESSION/DISCECTOMY FUSION 2 LEVELS;  Surgeon: Faythe Ghee, MD;  Location: [place] [facility];  Service: Neurosurgery;  Laterality: N/A;  anterior cervical five-six,six-seven decompression fusion plating  . ANTERIOR LAT LUMBAR FUSION  03/19/2012   Procedure: ANTERIOR LATERAL LUMBAR FUSION 1 LEVEL;  Surgeon: Faythe Ghee, MD;  Location: [place] [facility];  Service: Neurosurgery;  Laterality: Right;  Anterolateral Lumbar Interbody Fusion, Lumbar Three-Four (Hand broke through glove and nat in the room)  . BACK SURGERY    . BUNIONECTOMY Left 08/04/2014   Procedure: LEFT FOOT LAPIDUS BUNION CORRECTION, LEFT SECOND TARSALMETATARSAL JOINT DEBRIDEMENT AND LEFT MODIFIED MCBRIDE BUNIONECTOMY;  Surgeon: [name], MD;  Location: [facility];  Service: Orthopedics;  Laterality: Left;  . CARPAL TUNNEL RELEASE Bilateral   . CHONDROPLASTY Left 12/25/2016   Procedure: CHONDROPLASTY LEFT PATELLA;  Surgeon: Carole Civil, MD;  Location: [facility];  Service: Orthopedics;  Laterality: Left;  . CLEFT PALATE REPAIR    . COLONOSCOPY N/A 02/16/2015   Procedure: COLONOSCOPY;  Surgeon: Rogene Houston, MD;  [facility];  Service: Endoscopy;  Laterality: N/A;  155  . ENDOMETRIAL ABLATION    .  ESOPHAGOGASTRODUODENOSCOPY N/A 02/16/2015   Procedure: ESOPHAGOGASTRODUODENOSCOPY (EGD);  Surgeon: Rogene Houston, MD;  [facility];  Service: Endoscopy;  Laterality: N/A;  . KNEE ARTHROSCOPY WITH MEDIAL MENISECTOMY Left 12/25/2016   Procedure: KNEE ARTHROSCOPY WITH MEDIAL AND LATERAL MENISECTOMY;   Surgeon: Carole Civil, MD;  Location: [facility];  Service: Orthopedics;  Laterality: Left;  . LAPAROSCOPIC TUBAL LIGATION     [facility]  . MAXIMUM ACCESS (MAS)POSTERIOR LUMBAR INTERBODY FUSION (PLIF) 2 LEVEL  08/08/2016  . NOSE SURGERY    . SHOULDER OPEN ROTATOR CUFF REPAIR Right 04/02/2013   Procedure: ROTATOR CUFF REPAIR SHOULDER OPEN;  Surgeon: Carole Civil, MD;  Location: [facility];  Service: Orthopedics;  Laterality: Right;  . THROAT SURGERY  2000   for throat cancer/no problems intubation...[facility]  . TONSILLECTOMY    . TUBAL LIGATION      Allergies  Allergen Reactions  . Bee Venom Anaphylaxis  . Adhesive [Tape] Itching    Some types of adhesive tape and paper tape rip up the skin.    Marland Kitchen Morphine And Related Other (See Comments)    Severe headache    Current Outpatient Prescriptions on File Prior to Visit  Medication Sig Dispense Refill  . albuterol (PROVENTIL HFA;VENTOLIN HFA) 108 (90 Base) MCG/ACT inhaler Inhale 2 puffs into the lungs every 6 (six) hours as needed for wheezing or shortness of breath.    . Aspirin-Salicylamide-Caffeine (BC HEADACHE POWDER PO) Take 1 packet by mouth daily as needed (headaches).    . Biotin 10000 MCG TABS Take 10,000 mcg by mouth daily.    . diazepam (VALIUM) 5 MG tablet Take 1 tablet (5 mg total) by mouth 3 (three) times daily as needed for muscle spasms. (Patient taking differently: Take 5 mg by mouth 3 (three) times daily as needed for anxiety. ) 50 tablet 0  . diclofenac (CATAFLAM) 50 MG tablet Take 1 tablet (50 mg total) by mouth 2 (two) times daily. 60 tablet 0  . diclofenac sodium (VOLTAREN) 1 % GEL Apply 2 g topically 4 (four) times daily as needed (pain).    Marland Kitchen docusate sodium (COLACE) 100 MG capsule Take 100 mg by mouth at bedtime.    Marland Kitchen EPINEPHrine 0.3 mg/0.3 mL IJ SOAJ injection Inject 0.3 mg into the muscle daily as needed (for anaphalytic allergic reactions).     Marland Kitchen escitalopram (LEXAPRO) 20 MG tablet Take 20 mg by  mouth daily before breakfast.     . EUCRISA 2 % OINT Apply 1 application topically 4 (four) times daily. Applied to elbows.    . gabapentin (NEURONTIN) 300 MG capsule Take 300 mg by mouth 4 (four) times daily.    . mirabegron ER (MYRBETRIQ) 50 MG TB24 tablet Take 50 mg by mouth daily.    . Oxycodone HCl 10 MG TABS Take 10 mg by mouth 4 (four) times daily as needed (for pain). For pain.    . pantoprazole (PROTONIX) 40 MG tablet Take 1 tablet (40 mg total) by mouth 2 (two) times daily. 60 tablet 3  . prednisoLONE acetate (PRED FORTE) 1 % ophthalmic suspension Place 1 drop into both eyes 4 (four) times daily.    . Skin Protectants, Misc. (EUCERIN) cream Apply 1 application topically 2 (two) times daily as needed (for dry areas around patient elbows.).    Marland Kitchen topiramate (TOPAMAX) 50 MG tablet Take 50 mg by mouth at bedtime.     . varenicline (CHANTIX) 1 MG tablet Take 1 mg by mouth 2 (two)  times daily.     No current facility-administered medications on file prior to visit.         Objective:   Physical Exam Blood pressure 130/80, pulse 68, temperature 98 F (36.7 C), height 5' 5.5" (1.664 m), weight 264 lb 6.4 oz (119.9 kg). Alert and oriented. Skin warm and dry. Oral mucosa is moist.   . Sclera anicteric, conjunctivae is pink. Thyroid not enlarged. No cervical lymphadenopathy. Lungs clear. Heart regular rate and rhythm.  Abdomen is soft. Bowel sounds are positive. No hepatomegaly. No abdominal masses felt. No tenderness.  No edema to lower extremities.           Assessment & Plan:     GERD controlled at this time with Protonix. OV in 1 year. IBS: Rx for Levsin sent to her pharmacy.

## 2017-02-13 ENCOUNTER — Other Ambulatory Visit: Payer: Self-pay | Admitting: Orthopedic Surgery

## 2017-03-10 ENCOUNTER — Encounter: Payer: Self-pay | Admitting: Orthopedic Surgery

## 2017-03-10 ENCOUNTER — Ambulatory Visit (INDEPENDENT_AMBULATORY_CARE_PROVIDER_SITE_OTHER): Payer: Medicare Other | Admitting: Orthopedic Surgery

## 2017-03-10 VITALS — Ht 64.0 in | Wt 250.0 lb

## 2017-03-10 DIAGNOSIS — M25462 Effusion, left knee: Secondary | ICD-10-CM

## 2017-03-10 DIAGNOSIS — Z9889 Other specified postprocedural states: Secondary | ICD-10-CM | POA: Diagnosis not present

## 2017-03-10 DIAGNOSIS — Z4889 Encounter for other specified surgical aftercare: Secondary | ICD-10-CM

## 2017-03-10 NOTE — Progress Notes (Signed)
Status post left knee arthroscopy findings are below  MRI report is also below  Patient complains of severe left leg pain starting in the groin and radiated to the foot caused her to bend over and severe pain. This was noted over the weekend that seems to get have gotten better but her left knee is painful swollen and hard to bend   Mri before surgery  1. Maceration of the anterior horn of the lateral meniscus with a 7 mm parameniscal cyst adjacent to the root. Increased signal and expansion of the anterior horn- body junction of the medial meniscus likely reflecting a small intrameniscal cyst. 2. Oblique tear of the posterior horn of the medial meniscus extending to the inferior articular surface. 3. Tricompartmental cartilage abnormalities as described above.     Electronically Signed   By: Kathreen Devoid   On: 10/25/2016 12:18  Surgery report PATIENT:  [name]  51 y.o. female   PRE-OPERATIVE DIAGNOSIS:  LEFT KNEE MEDIAL AND LATERAL MENISCUS TEARS   POST-OPERATIVE DIAGNOSIS:  LEFT KNEE MEDIAL AND LATERAL MENISCUS TEARS, CHONDROMALACIA LEFT PATELLA, LATERAL FACET    PROCEDURE:  Procedure(s): KNEE ARTHROSCOPY WITH MEDIAL AND LATERAL MENISECTOMY (Left) CHONDROPLASTY LEFT PATELLA (Left)  Operative findings were normal medial articular cartilage, fraying medial meniscus, lateral meniscus anterior horn tear with portion of the body torn as well. Patellofemoral trochlea cartilage lateral facet grade 2/3 chondromalacia normal anterior cruciate ligament PCL   We find a moderate effusion flexion limited to 110 she can make full extension her knee is warm and tender diffusely   She consented and gave permission for aspiration injection left knee and we switched her braces   Encounter Diagnoses  Name Primary?  . S/P left knee arthroscopy Yes  . Effusion of knee joint, left   . Aftercare following surgery     Procedure note injection and aspiration left knee joint  Verbal  consent was obtained to aspirate and inject the left knee joint   Timeout was completed to confirm the site of aspiration and injection  An 18-gauge needle was used to aspirate the left knee joint from a suprapatellar lateral approach.  The medications used were 40 mg of Depo-Medrol and 1% lidocaine 3 cc  Anesthesia was provided by ethyl chloride and the skin was prepped with alcohol.  After cleaning the skin with alcohol an 18-gauge needle was used to aspirate the right knee joint.  We obtained 25 cc of fluid  We followed this by injection of 40 mg of Depo-Medrol and 3 cc 1% lidocaine.  There were no complications. A sterile bandage was applied.  Follow-up in 2 weeks

## 2017-04-02 ENCOUNTER — Telehealth: Payer: Self-pay | Admitting: *Deleted

## 2017-04-02 MED ORDER — DICLOFENAC POTASSIUM 50 MG PO TABS: 50.0000 mg | ORAL_TABLET | Freq: Two times a day (BID) | ORAL | 0 refills | Status: DC

## 2017-04-02 NOTE — Telephone Encounter (Signed)
Patient called requesting a refill on diclofenac 50mg  tablet to [facility]. Please call patient once this is refilled [phone]  Patient states she has a lot of fluid in her knee.

## 2017-04-02 NOTE — Telephone Encounter (Signed)
Routing to Dr. Keeling 

## 2017-04-08 ENCOUNTER — Ambulatory Visit: Payer: Medicare Other | Admitting: Orthopedic Surgery

## 2017-04-09 ENCOUNTER — Ambulatory Visit (INDEPENDENT_AMBULATORY_CARE_PROVIDER_SITE_OTHER): Payer: Medicare Other | Admitting: Orthopedic Surgery

## 2017-04-09 DIAGNOSIS — Z4889 Encounter for other specified surgical aftercare: Secondary | ICD-10-CM

## 2017-04-09 DIAGNOSIS — M25462 Effusion, left knee: Secondary | ICD-10-CM

## 2017-04-09 DIAGNOSIS — Z9889 Other specified postprocedural states: Secondary | ICD-10-CM

## 2017-04-09 MED ORDER — DICLOFENAC POTASSIUM 50 MG PO TABS: 50.0000 mg | ORAL_TABLET | Freq: Two times a day (BID) | ORAL | 0 refills | Status: DC

## 2017-04-09 MED ORDER — DICLOFENAC POTASSIUM 50 MG PO TABS: 50.0000 mg | ORAL_TABLET | Freq: Two times a day (BID) | ORAL | 5 refills | Status: DC

## 2017-04-09 NOTE — Progress Notes (Signed)
Postop. Follow-up global period Surgery date 12/25/2016  Encounter Diagnoses  Name Primary?  . S/P left knee arthroscopy   . Effusion of knee joint, left   . Aftercare following surgery Yes    She's doing pretty well. She can flex the knee past 90. She has some trouble at nighttime but does well during the day.  She still using her cane  She has no erythema to the knee she does have an effusion so she has consented for aspiration injection   Procedure note injection and aspiration left knee joint  Verbal consent was obtained to aspirate and inject the left knee joint   Timeout was completed to confirm the site of aspiration and injection  An 18-gauge needle was used to aspirate the left knee joint from a suprapatellar lateral approach.  The medications used were 40 mg of Depo-Medrol and 1% lidocaine 3 cc  Anesthesia was provided by ethyl chloride and the skin was prepped with alcohol.  After cleaning the skin with alcohol an 18-gauge needle was used to aspirate the right knee joint.  We obtained 32 cc of fluid, clear   We followed this by injection of 40 mg of Depo-Medrol and 3 cc 1% lidocaine.  There were no complications. A sterile bandage was applied.   Status post aspiration injection 2 weeks ago for swelling  Return in 2 weeks  Meds ordered this encounter  Medications  . DISCONTD: diclofenac (CATAFLAM) 50 MG tablet    Sig: Take 1 tablet (50 mg total) by mouth 2 (two) times daily.    Dispense:  60 tablet    Refill:  0  . diclofenac (CATAFLAM) 50 MG tablet    Sig: Take 1 tablet (50 mg total) by mouth 2 (two) times daily.    Dispense:  60 tablet    Refill:  5

## 2017-04-23 ENCOUNTER — Encounter: Payer: Self-pay | Admitting: Orthopedic Surgery

## 2017-04-23 ENCOUNTER — Ambulatory Visit (INDEPENDENT_AMBULATORY_CARE_PROVIDER_SITE_OTHER): Payer: Medicare Other | Admitting: Orthopedic Surgery

## 2017-04-23 VITALS — BP 118/75 | HR 57 | Ht 64.0 in | Wt 248.0 lb

## 2017-04-23 DIAGNOSIS — Z9889 Other specified postprocedural states: Secondary | ICD-10-CM | POA: Diagnosis not present

## 2017-04-23 DIAGNOSIS — M25462 Effusion, left knee: Secondary | ICD-10-CM | POA: Diagnosis not present

## 2017-04-23 NOTE — Addendum Note (Signed)
Addended by: Moreen Fowler R on: 04/23/2017 03:13 PM   Modules accepted: Orders

## 2017-04-23 NOTE — Progress Notes (Signed)
Postop visit  Chief Complaint  Patient presents with  . Follow-up    2 week Left Knee post arthroscopy   May 30 date of surgery  Status post left knee arthroscopy Status post aspiration for effusion  She is much improved regarding her left knee she's regained full range of motion, she's using her cane and a knee sleeve brace,  She is complaining of balance issues so we are going to send her to the hospital for balance testing  Her Romberg test was positive.  Follow-up when necessary  Encounter Diagnoses  Name Primary?  . Effusion of knee joint, left Yes  . S/P left knee arthroscopy

## 2017-04-23 NOTE — Patient Instructions (Signed)
Steps to Quit Smoking Smoking tobacco can be bad for your health. It can also affect almost every organ in your body. Smoking puts you and people around you at risk for many serious long-lasting (chronic) diseases. Quitting smoking is hard, but it is one of the best things that you can do for your health. It is never too late to quit. What are the benefits of quitting smoking? When you quit smoking, you lower your risk for getting serious diseases and conditions. They can include:  Lung cancer or lung disease.  Heart disease.  Stroke.  Heart attack.  Not being able to have children (infertility).  Weak bones (osteoporosis) and broken bones (fractures).  If you have coughing, wheezing, and shortness of breath, those symptoms may get better when you quit. You may also get sick less often. If you are pregnant, quitting smoking can help to lower your chances of having a baby of low birth weight. What can I do to help me quit smoking? Talk with your doctor about what can help you quit smoking. Some things you can do (strategies) include:  Quitting smoking totally, instead of slowly cutting back how much you smoke over a period of time.  Going to in-person counseling. You are more likely to quit if you go to many counseling sessions.  Using resources and support systems, such as: ? Online chats with a counselor. ? Phone quitlines. ? Printed self-help materials. ? Support groups or group counseling. ? Text messaging programs. ? Mobile phone apps or applications.  Taking medicines. Some of these medicines may have nicotine in them. If you are pregnant or breastfeeding, do not take any medicines to quit smoking unless your doctor says it is okay. Talk with your doctor about counseling or other things that can help you.  Talk with your doctor about using more than one strategy at the same time, such as taking medicines while you are also going to in-person counseling. This can help make  quitting easier. What things can I do to make it easier to quit? Quitting smoking might feel very hard at first, but there is a lot that you can do to make it easier. Take these steps:  Talk to your family and friends. Ask them to support and encourage you.  Call phone quitlines, reach out to support groups, or work with a counselor.  Ask people who smoke to not smoke around you.  Avoid places that make you want (trigger) to smoke, such as: ? Bars. ? Parties. ? Smoke-break areas at work.  Spend time with people who do not smoke.  Lower the stress in your life. Stress can make you want to smoke. Try these things to help your stress: ? Getting regular exercise. ? Deep-breathing exercises. ? Yoga. ? Meditating. ? Doing a body scan. To do this, close your eyes, focus on one area of your body at a time from head to toe, and notice which parts of your body are tense. Try to relax the muscles in those areas.  Download or buy apps on your mobile phone or tablet that can help you stick to your quit plan. There are many free apps, such as QuitGuide from the CDC (Centers for Disease Control and Prevention). You can find more support from smokefree.gov and other websites.  This information is not intended to replace advice given to you by your health care provider. Make sure you discuss any questions you have with your health care provider. Document Released: 05/11/2009 Document   Revised: 03/12/2016 Document Reviewed: 11/29/2014 Elsevier Interactive Patient Education  2018 Elsevier Inc.  

## 2017-04-29 ENCOUNTER — Ambulatory Visit: Payer: Medicare Other | Admitting: Physical Therapy

## 2017-05-06 ENCOUNTER — Ambulatory Visit: Payer: Medicare Other | Attending: Orthopedic Surgery | Admitting: Physical Therapy

## 2017-05-06 ENCOUNTER — Encounter: Payer: Self-pay | Admitting: Physical Therapy

## 2017-05-06 DIAGNOSIS — G8929 Other chronic pain: Secondary | ICD-10-CM | POA: Diagnosis present

## 2017-05-06 DIAGNOSIS — M25562 Pain in left knee: Secondary | ICD-10-CM | POA: Diagnosis present

## 2017-05-06 DIAGNOSIS — R2681 Unsteadiness on feet: Secondary | ICD-10-CM | POA: Diagnosis present

## 2017-05-06 NOTE — Therapy (Addendum)
Dictation #1 [id number]  [id number] [facility] [place], [place], [address] Phone: [phone]   Fax:  [phone]  Physical Therapy Evaluation  Patient Details  Name: [name] MRN: [id number] Date of Birth: 02-Feb-1966 Referring Provider: Arther Abbott MD.  Encounter Date: 05/06/2017      PT End of Session - 05/06/17 1604    Visit Number 1   Number of Visits 12   Date for PT Re-Evaluation 06/17/17      Past Medical History:  Diagnosis Date  . ADHD (attention deficit hyperactivity disorder)   . Anxiety   . Arthritis    disk disease  . Barrett esophagus   . Chronic pain   . COPD (chronic obstructive pulmonary disease) ([place])   . Depression   . Dyspnea   . Fibromyalgia   . GERD (gastroesophageal reflux disease)    IBS  . Headache   . Hot flashes 04/11/2015  . IBS (irritable bowel syndrome)   . Obesity   . Peripheral arterial disease ([place])   . Plantar fasciitis of left foot   . PTSD (post-traumatic stress disorder)   . RLQ abdominal pain 04/11/2015  . 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 ANTERIOR CERVICAL DECOMP/DISCECTOMY FUSION  08/18/2012   Procedure: ANTERIOR CERVICAL DECOMPRESSION/DISCECTOMY FUSION 2 LEVELS;  Surgeon: Faythe Ghee, MD;  Location: [place] NEURO ORS;  Service: Neurosurgery;  Laterality: N/A;  anterior cervical five-six,six-seven decompression fusion plating  . ANTERIOR LAT LUMBAR FUSION  03/19/2012   Procedure: ANTERIOR LATERAL LUMBAR FUSION 1 LEVEL;  Surgeon: Faythe Ghee, MD;  Location: [place] NEURO ORS;  Service: Neurosurgery;  Laterality: Right;  Anterolateral Lumbar Interbody Fusion, Lumbar Three-Four (Hand broke through glove and nat in the room)  . BACK SURGERY    . BUNIONECTOMY Left 08/04/2014   Procedure: LEFT FOOT LAPIDUS BUNION CORRECTION, LEFT SECOND TARSALMETATARSAL JOINT DEBRIDEMENT AND LEFT  MODIFIED MCBRIDE BUNIONECTOMY;  Surgeon: [name], MD;  Location: [place];  Service: Orthopedics;  Laterality: Left;  . CARPAL TUNNEL RELEASE Bilateral   . CHONDROPLASTY Left 12/25/2016   Procedure: CHONDROPLASTY LEFT PATELLA;  Surgeon: Carole Civil, MD;  Location: [facility];  Service: Orthopedics;  Laterality: Left;  . CLEFT PALATE REPAIR    . COLONOSCOPY N/A 02/16/2015   Procedure: COLONOSCOPY;  Surgeon: Rogene Houston, MD;  [facility];  Service: Endoscopy;  Laterality: N/A;  155  . ENDOMETRIAL ABLATION    . ESOPHAGOGASTRODUODENOSCOPY N/A 02/16/2015   Procedure: ESOPHAGOGASTRODUODENOSCOPY (EGD);  Surgeon: Rogene Houston, MD;  [facility];  Service: Endoscopy;  Laterality: N/A;  . KNEE ARTHROSCOPY WITH MEDIAL MENISECTOMY Left 12/25/2016   Procedure: KNEE ARTHROSCOPY WITH MEDIAL AND LATERAL MENISECTOMY;  Surgeon: Carole Civil, MD;  Location: [facility];  Service: Orthopedics;  Laterality: Left;  . LAPAROSCOPIC TUBAL LIGATION     [facility]  . MAXIMUM ACCESS (MAS)POSTERIOR LUMBAR INTERBODY FUSION (PLIF) 2 LEVEL  08/08/2016  . NOSE SURGERY    . SHOULDER OPEN ROTATOR CUFF REPAIR Right 04/02/2013   Procedure: ROTATOR CUFF REPAIR SHOULDER OPEN;  Surgeon: Carole Civil, MD;  Location[facility];  Service: Orthopedics;  Laterality: Right;  . THROAT SURGERY  2000   for throat cancer/no problems intubation...[facility]  . TONSILLECTOMY    . TUBAL LIGATION      There were no vitals filed for this visit.  Subjective Assessment - 05/06/17 1525    Subjective The patient presents to [facility] with c/o left knee pain.  She had arthroscopic surgery on 12/25/16.  She states her knees had been aspirated twice.  She also states she loses her balance.  Her left knee pain-level is an 8/10.  Walking increases her pain and she has not found anything to decrease her pain.     Pertinent History Fibromyalgia; previous neck and low back surgeries.    How long can you walk comfortably? Short community distances.   Patient Stated Goals Reduce pain and not lose balance.   Pain Score 8    Pain Location Knee   Pain Orientation Left   Pain Descriptors / Indicators Aching;Throbbing   Pain Type Chronic pain   Pain Onset More than a month ago   Aggravating Factors  See above.   Pain Relieving Factors See above.            [facility] PT Assessment - 05/06/17 0001      Assessment   Medical Diagnosis Left knee scope.   Referring Provider Arther Abbott MD.   Onset Date/Surgical Date --  12/25/16 (surgery date).     Precautions   Precautions Fall     Restrictions   Weight Bearing Restrictions No     Balance Screen   Has the patient fallen in the past 6 months Yes   How many times? --  4.   Has the patient had a decrease in activity level because of a fear of falling?  Yes   Is the patient reluctant to leave their home because of a fear of falling?  Yes     [place] residence     Prior Function   Level of Independence Independent     Observation/Other Assessments-Edema    Edema --  Circum mid-pat region = today.  Pocket of edema noted though     ROM / Strength   AROM / PROM / Strength AROM;Strength     AROM   Overall AROM Comments Full active left knee range of motion.     Strength   Overall Strength Comments Left knee strength is normal.     Palpation   Palpation comment Patient c/o pain around her left kneecap.     Ambulation/Gait   Gait Pattern Antalgic            Objective measurements completed on examination: See above findings.          [facility] Adult PT Treatment/Exercise - 05/06/17 0001      Modalities   Modalities Electrical Stimulation;Vasopneumatic     Electrical Stimulation   Electrical Stimulation Location Left knee   Electrical Stimulation Action IFC   Electrical Stimulation Parameters 1-10 hz x 10 minutes.   Electrical Stimulation Goals Edema;Pain      Vasopneumatic   Number Minutes Vasopneumatic  10 minutes   Vasopnuematic Location  --  Left knee.   Vasopneumatic Pressure Medium                  PT Short Term Goals - 11/04/16 1959      PT SHORT TERM GOAL #1   Title STG's=LTG's.           PT Long Term Goals - 05/06/17 1606      PT LONG TERM GOAL #1   Title Indepenent with a HEP.   Time 6   Period Weeks   Status New  PT LONG TERM GOAL #2   Title Decrease left knee pain to 3/10.   Time 6   Period Weeks   Status New     PT LONG TERM GOAL #3   Title Negative Romberg test.   Time 6   Period Weeks   Status New                Plan - 05/06/17 1550    Clinical Impression Statement The patient has ongoing c/o left knee pain.  She had surgery on 12/25/16.  She reports balance losses and demonstrates a positive Romberg test.  She reports a high left knee pain-level today.  Patient's functional mobility is impaired.  Patient willbenefit from skilled physical therapy to address deficits.   History and Personal Factors relevant to plan of care: Multiple co-morbidites and balance problems.   Clinical Presentation Evolving   Clinical Decision Making Moderate   Clinical Impairments Affecting Rehab Potential H/o low back pain; continues left knee pain of a high nature.   PT Frequency 2x / week   PT Duration 6 weeks   PT Treatment/Interventions ADLs/Self Care Home Management;Cryotherapy;Electrical Stimulation;Ultrasound;Gait training;Stair training;Neuromuscular re-education;Balance training;Therapeutic exercise;Therapeutic activities;Functional mobility training;Patient/family education;Manual techniques;Vasopneumatic Device   PT Next Visit Plan Pain-free left quadriceps strengthening.  Modalites PRN.  Balance training.   Consulted and Agree with Plan of Care Patient      Patient will benefit from skilled therapeutic intervention in order to improve the following deficits and impairments:  Abnormal gait,  Decreased activity tolerance, Decreased balance, Decreased mobility, Pain  Visit Diagnosis: Chronic pain of left knee - Plan: PT plan of care cert/re-cert  Unsteadiness on feet - Plan: PT plan of care cert/re-cert     Problem List Patient Active Problem List   Diagnosis Date Noted  . Derangement of posterior horn of medial meniscus of left knee   . Lateral meniscus, anterior horn derangement, left   . Chondromalacia of patellofemoral joint, left   . Lumbar adjacent segment disease with spondylolisthesis 08/08/2016  . RLQ abdominal pain 04/11/2015  . Hot flashes 04/11/2015  . Short-segment Barrett's esophagus 02/28/2015  . Peripheral arterial disease ([place]) 12/13/2013  . Status post rotator cuff repair 04/15/2013  . Rotator cuff tear 11/26/2012  . Rotator cuff syndrome of right shoulder 10/27/2012  . Right shoulder pain 10/27/2012  . HELICOBACTER PYLORI [place] INFECTION 11/03/2009  . DEPRESSION 11/02/2009  . RESTLESS LEG SYNDROME 11/02/2009  . GASTROESOPHAGEAL REFLUX DISEASE, CHRONIC 11/02/2009  . CONSTIPATION 11/02/2009  . IRRITABLE BOWEL SYNDROME 11/02/2009  . ARTHRITIS 11/02/2009  . BACK PAIN, CHRONIC 11/02/2009  . NAUSEA 11/02/2009  . DIARRHEA 11/02/2009  . ABDOMINAL PAIN 11/02/2009  . RECTAL BLEEDING, HX OF 11/02/2009    Cristalle Rohm, [place] MPT 05/06/2017, 4:09 PM  [facility] [address] [place], [place], [address] Phone: [phone]   Fax:  [phone]  Name: [name] MRN: [id number] Date of Birth: October 03, 1965  PHYSICAL THERAPY DISCHARGE SUMMARY  Visits from Start of Care: 1.  Current functional level related to goals / functional outcomes: See above.   Remaining deficits: Patient did not return to PT.   Education / Equipment:  Plan: Patient agrees to discharge.  Patient goals were not met. Patient is being discharged due to not returning since the last visit.  ?????         Mali Quantrell Splitt  MPT

## 2017-05-07 ENCOUNTER — Ambulatory Visit (INDEPENDENT_AMBULATORY_CARE_PROVIDER_SITE_OTHER): Payer: Medicare Other | Admitting: Orthopedic Surgery

## 2017-05-07 ENCOUNTER — Encounter: Payer: Self-pay | Admitting: Orthopedic Surgery

## 2017-05-07 VITALS — BP 117/77 | HR 65 | Ht 64.0 in | Wt 250.0 lb

## 2017-05-07 DIAGNOSIS — M25462 Effusion, left knee: Secondary | ICD-10-CM

## 2017-05-07 DIAGNOSIS — Z9889 Other specified postprocedural states: Secondary | ICD-10-CM | POA: Diagnosis not present

## 2017-05-07 NOTE — Patient Instructions (Signed)
Steps to Quit Smoking Smoking tobacco can be bad for your health. It can also affect almost every organ in your body. Smoking puts you and people around you at risk for many serious Lumina Gitto-lasting (chronic) diseases. Quitting smoking is hard, but it is one of the best things that you can do for your health. It is never too late to quit. What are the benefits of quitting smoking? When you quit smoking, you lower your risk for getting serious diseases and conditions. They can include:  Lung cancer or lung disease.  Heart disease.  Stroke.  Heart attack.  Not being able to have children (infertility).  Weak bones (osteoporosis) and broken bones (fractures).  If you have coughing, wheezing, and shortness of breath, those symptoms may get better when you quit. You may also get sick less often. If you are pregnant, quitting smoking can help to lower your chances of having a baby of low birth weight. What can I do to help me quit smoking? Talk with your doctor about what can help you quit smoking. Some things you can do (strategies) include:  Quitting smoking totally, instead of slowly cutting back how much you smoke over a period of time.  Going to in-person counseling. You are more likely to quit if you go to many counseling sessions.  Using resources and support systems, such as: ? Online chats with a counselor. ? Phone quitlines. ? Printed self-help materials. ? Support groups or group counseling. ? Text messaging programs. ? Mobile phone apps or applications.  Taking medicines. Some of these medicines may have nicotine in them. If you are pregnant or breastfeeding, do not take any medicines to quit smoking unless your doctor says it is okay. Talk with your doctor about counseling or other things that can help you.  Talk with your doctor about using more than one strategy at the same time, such as taking medicines while you are also going to in-person counseling. This can help make  quitting easier. What things can I do to make it easier to quit? Quitting smoking might feel very hard at first, but there is a lot that you can do to make it easier. Take these steps:  Talk to your family and friends. Ask them to support and encourage you.  Call phone quitlines, reach out to support groups, or work with a counselor.  Ask people who smoke to not smoke around you.  Avoid places that make you want (trigger) to smoke, such as: ? Bars. ? Parties. ? Smoke-break areas at work.  Spend time with people who do not smoke.  Lower the stress in your life. Stress can make you want to smoke. Try these things to help your stress: ? Getting regular exercise. ? Deep-breathing exercises. ? Yoga. ? Meditating. ? Doing a body scan. To do this, close your eyes, focus on one area of your body at a time from head to toe, and notice which parts of your body are tense. Try to relax the muscles in those areas.  Download or buy apps on your mobile phone or tablet that can help you stick to your quit plan. There are many free apps, such as QuitGuide from the CDC (Centers for Disease Control and Prevention). You can find more support from smokefree.gov and other websites.  This information is not intended to replace advice given to you by your health care provider. Make sure you discuss any questions you have with your health care provider. Document Released: 05/11/2009 Document   Revised: 03/12/2016 Document Reviewed: 11/29/2014 Elsevier Interactive Patient Education  2018 Elsevier Inc.  

## 2017-05-07 NOTE — Progress Notes (Signed)
Follow-up visit  Patient ID: [name], female   DOB: Nov 29, 1965, 51 y.o.   MRN: [id number]  Chief Complaint  Patient presents with  . Follow-up    left knee   Arthroscopy left knee 12/25/2016   Encounter Diagnoses  Name Primary?  . S/P left knee arthroscopy Yes  . Effusion of knee joint, left     Findings at surgery: PATIENT:  [name]  51 y.o. female   PRE-OPERATIVE DIAGNOSIS:  LEFT KNEE MEDIAL AND LATERAL MENISCUS TEARS   POST-OPERATIVE DIAGNOSIS:  LEFT KNEE MEDIAL AND LATERAL MENISCUS TEARS, CHONDROMALACIA LEFT PATELLA, LATERAL FACET    PROCEDURE:  Procedure(s): KNEE ARTHROSCOPY WITH MEDIAL AND LATERAL MENISECTOMY (Left) CHONDROPLASTY LEFT PATELLA (Left)  [name] presents back with pain and swelling left knee and a little bit of flexion deficit  No new trauma  Review of systems bilateral hip pain balance disturbance. She went for balance testing it seems that her hips are causing her to have altered gait  Review of systems foot pain scheduled for surgery  Past Medical History:  Diagnosis Date  . ADHD (attention deficit hyperactivity disorder)   . Anxiety   . Arthritis    disk disease  . Barrett esophagus   . Chronic pain   . COPD (chronic obstructive pulmonary disease) ([place])   . Depression   . Dyspnea   . Fibromyalgia   . GERD (gastroesophageal reflux disease)    IBS  . Headache   . Hot flashes 04/11/2015  . IBS (irritable bowel syndrome)   . Obesity   . Peripheral arterial disease ([place])   . Plantar fasciitis of left foot   . PTSD (post-traumatic stress disorder)   . RLQ abdominal pain 04/11/2015  . Vocal cord cancer ([place])    surgery corrected the problem    BP 117/77   Pulse 65   Ht 5\' 4"  (1.626 m)   Wt 250 lb (113.4 kg)   BMI 42.91 kg/m  Left knee effusion. Range of motion 120. Ligament stable. Motor exam normal. Skin portals are clean no erythema. Neurovascular exam shows no gross deficits  Aspiration injection left knee 14 mL of clear  yellow fluid aspirated cortisone injected as below  Follow-up one week check swelling  Procedure note injection and aspiration left knee joint  Verbal consent was obtained to aspirate and inject the left knee joint   Timeout was completed to confirm the site of aspiration and injection  An 18-gauge needle was used to aspirate the left knee joint from a suprapatellar lateral approach.  The medications used were 40 mg of Depo-Medrol and 1% lidocaine 3 cc  Anesthesia was provided by ethyl chloride and the skin was prepped with alcohol.  After cleaning the skin with alcohol an 18-gauge needle was used to aspirate the right knee joint.  We obtained 14 cc of fluid, clear fluid  We followed this by injection of 40 mg of Depo-Medrol and 3 cc 1% lidocaine.  There were no complications. A sterile bandage was applied.

## 2017-05-13 ENCOUNTER — Encounter: Payer: Medicare Other | Admitting: *Deleted

## 2017-05-14 ENCOUNTER — Ambulatory Visit: Payer: Medicare Other | Admitting: Orthopedic Surgery

## 2017-05-16 ENCOUNTER — Encounter: Payer: Self-pay | Admitting: Orthopedic Surgery

## 2017-05-16 ENCOUNTER — Telehealth: Payer: Self-pay | Admitting: Radiology

## 2017-05-16 ENCOUNTER — Ambulatory Visit (INDEPENDENT_AMBULATORY_CARE_PROVIDER_SITE_OTHER): Payer: Medicare Other | Admitting: Orthopedic Surgery

## 2017-05-16 VITALS — BP 119/69 | HR 78 | Ht 64.0 in | Wt 241.0 lb

## 2017-05-16 DIAGNOSIS — M1712 Unilateral primary osteoarthritis, left knee: Secondary | ICD-10-CM | POA: Diagnosis not present

## 2017-05-16 DIAGNOSIS — M25462 Effusion, left knee: Secondary | ICD-10-CM

## 2017-05-16 DIAGNOSIS — Z9889 Other specified postprocedural states: Secondary | ICD-10-CM | POA: Diagnosis not present

## 2017-05-16 NOTE — Telephone Encounter (Signed)
email sent to [employer] rep to see what patient needs for the custom knee brace.

## 2017-05-16 NOTE — Progress Notes (Addendum)
Chief Complaint  Patient presents with  . Knee Pain     arthroscopy 12/25/16 has continued pain/ gives out if not wearing brace   51 year old status post arthroscopy of the left knee. She has arthritis in the knee she had medial and lateral meniscal tears however she had no ligamentous instability or laxity or anterior cruciate ligament or PCL tear  Review of Systems  Musculoskeletal: Positive for back pain.      PRE-OPERATIVE DIAGNOSIS:  LEFT KNEE MEDIAL AND LATERAL MENISCUS TEARS   POST-OPERATIVE DIAGNOSIS:  LEFT KNEE MEDIAL AND LATERAL MENISCUS TEARS, CHONDROMALACIA LEFT PATELLA, LATERAL FACET    PROCEDURE:  Procedure(s): KNEE ARTHROSCOPY WITH MEDIAL AND LATERAL MENISECTOMY (Left) CHONDROPLASTY LEFT PATELLA (Left)    The operative findings are    Medial SMALL AREA OF THINNING OF POSTERIOR HORN OF THE MEDIAL MENISCUS , ARTICULAR CARTILAGE NORMAL  Lateral anterior horn tear lateral meniscus with lateral meniscal cyst and a portion of the body of the lateral meniscus was also torn, the articular surfaces were normal Patellofemoral trochlear area was normal the lateral facet had grade 2-3 chondromalacia Notch normal anterior cruciate ligament and PCL   BP 119/69   Pulse 78   Ht 5\' 4"  (1.626 m)   Wt 241 lb (109.3 kg)   BMI 41.37 kg/m  She is awake alert and oriented today her mood is slightly depressed. Her appearance is normal well-groomed   Left Knee Exam  Swelling: None Effusion: Yes  Tenderness  The patient is experiencing tenderness in the Periarticular soft tissue mild tenderness.  Range of Motion  Extension: Normal Flexion:     130  Tests  McMurrays:  Medial - Negative      Lateral - Negative Drawer:       Anterior - Negative     Posterior - Negative Varus:  Negative Valgus: Negative  Comments:  Ambulatory status walking with a cane has a slight limp    Instability continues in the left knee. As noted in the operative findings there are no major  ligament injuries. Her knee feels stable to exam. She has intermittent effusions.  I ordered a customized brace for instability. Thigh circumference 24 inches Circumference 16 inches  Copy to Dr. Arnoldo Morale  regarding the left knee as it does not need replacement there is no instability in the knee. Her giving way symptoms are coming from weakness in the left leg    When we get her brace will let her wear for about a month and will have her come in after that to see how it's working. She can stop physical therapy as they are only using TENS unit and cryotherapy.

## 2017-06-02 ENCOUNTER — Ambulatory Visit (INDEPENDENT_AMBULATORY_CARE_PROVIDER_SITE_OTHER): Payer: Medicare Other | Admitting: Orthopedic Surgery

## 2017-06-02 ENCOUNTER — Encounter: Payer: Self-pay | Admitting: Orthopedic Surgery

## 2017-06-02 VITALS — BP 172/103 | HR 74 | Ht 64.0 in | Wt 246.0 lb

## 2017-06-02 DIAGNOSIS — Z9889 Other specified postprocedural states: Secondary | ICD-10-CM

## 2017-06-02 DIAGNOSIS — M25462 Effusion, left knee: Secondary | ICD-10-CM

## 2017-06-02 DIAGNOSIS — M1712 Unilateral primary osteoarthritis, left knee: Secondary | ICD-10-CM

## 2017-06-02 NOTE — Progress Notes (Signed)
Chief Complaint  Patient presents with  . Knee Pain    left states has fluid increased pain     51 year old female had left knee arthroscopy.  He comes in today with acute pain swelling difficulty bending her knee.  No recent trauma.  She has had multiple aspirations and injections in the over the last 1 few months     Small effusion palpated.  No erythema around the knee joint.  Tender to palpation around the Patellar structures and joint line.    I aspirated 18 cc of fluid from the left knee  Procedure note injection and aspiration left knee joint  Verbal consent was obtained to aspirate and inject the left knee joint   Timeout was completed to confirm the site of aspiration and injection  An 18-gauge needle was used to aspirate the left knee joint from a suprapatellar lateral approach.  The medications used were 40 mg of Depo-Medrol and 1% lidocaine 3 cc  Anesthesia was provided by ethyl chloride and the skin was prepped with alcohol.  After cleaning the skin with alcohol an 18-gauge needle was used to aspirate the right knee joint.  We obtained 18 cc of fluid  We followed this by injection of 40 mg of Depo-Medrol and 3 cc 1% lidocaine.  There were no complications. A sterile bandage was applied.  PRE-OPERATIVE DIAGNOSIS:  LEFT KNEE MEDIAL AND LATERAL MENISCUS TEARS   POST-OPERATIVE DIAGNOSIS:  LEFT KNEE MEDIAL AND LATERAL MENISCUS TEARS, CHONDROMALACIA LEFT PATELLA, LATERAL FACET    PROCEDURE:  Procedure(s): KNEE ARTHROSCOPY WITH MEDIAL AND LATERAL MENISECTOMY (Left) CHONDROPLASTY LEFT PATELLA (Left)   Knee arthroscopy The operative findings are    Medial SMALL AREA OF THINNING OF POSTERIOR HORN OF THE MEDIAL MENISCUS , ARTICULAR CARTILAGE NORMAL  Lateral anterior horn tear lateral meniscus with lateral meniscal cyst and a portion of the body of the lateral meniscus was also torn, the articular surfaces were normal Patellofemoral trochlear area was normal the  lateral facet had grade 2-3 chondromalacia Notch normal anterior cruciate ligament and PCL  She would like to see Dr. Arnoldo Morale as soon as possible

## 2017-07-03 DIAGNOSIS — Z9889 Other specified postprocedural states: Secondary | ICD-10-CM | POA: Insufficient documentation

## 2017-07-04 ENCOUNTER — Ambulatory Visit (INDEPENDENT_AMBULATORY_CARE_PROVIDER_SITE_OTHER): Payer: Medicare Other | Admitting: Orthopedic Surgery

## 2017-07-04 VITALS — Ht 64.0 in

## 2017-07-04 DIAGNOSIS — M25462 Effusion, left knee: Secondary | ICD-10-CM | POA: Diagnosis not present

## 2017-07-04 DIAGNOSIS — M7062 Trochanteric bursitis, left hip: Secondary | ICD-10-CM

## 2017-07-04 DIAGNOSIS — Z9889 Other specified postprocedural states: Secondary | ICD-10-CM | POA: Diagnosis not present

## 2017-07-04 DIAGNOSIS — M7061 Trochanteric bursitis, right hip: Secondary | ICD-10-CM

## 2017-07-04 DIAGNOSIS — M1712 Unilateral primary osteoarthritis, left knee: Secondary | ICD-10-CM

## 2017-07-04 NOTE — Progress Notes (Signed)
Chief Complaint  Patient presents with  . Follow-up    Recheck on left knee.    Also complains of tenderness over both hips request bilateral greater trochanteric bursa injections  Procedure note injection and aspiration left knee joint  Verbal consent was obtained to aspirate and inject the left knee joint   Timeout was completed to confirm the site of aspiration and injection  An 18-gauge needle was used to aspirate the left knee joint from a suprapatellar lateral approach.  The medications used were 40 mg of Depo-Medrol and 1% lidocaine 3 cc  Anesthesia was provided by ethyl chloride and the skin was prepped with alcohol.  After cleaning the skin with alcohol an 18-gauge needle was used to aspirate the right knee joint.  We obtained 30 cc of fluid  We followed this by injection of 40 mg of Depo-Medrol and 3 cc 1% lidocaine.  There were no complications. A sterile bandage was applied.  Procedure note injection for   right hip bursitis  Verbal consent was obtained for injection of the right hip  Timeout was completed to confirm the injection site  The medications used were 40 mg of Depo-Medrol and 1% lidocaine 3 cc  Anesthesia was provided by ethyl chloride and the skin was prepped with alcohol.  After cleaning the skin with alcohol a 25-gauge needle was used to inject the right bursa of the hip  Procedure note injection for   left hip bursitis  Verbal consent was obtained for injection of the left left hip  Timeout was completed to confirm the injection site  The medications used were 40 mg of Depo-Medrol and 1% lidocaine 3 cc  Anesthesia was provided by ethyl chloride and the skin was prepped with alcohol.  After cleaning the skin with alcohol a 25-gauge needle was used to inject the left bursa of the hip   Encounter Diagnoses  Name Primary?  . S/P left knee arthroscopy 12/25/16 Yes  . Primary osteoarthritis of left knee   . Effusion of left knee   .  Trochanteric bursitis of both hips

## 2017-07-09 ENCOUNTER — Other Ambulatory Visit: Payer: Self-pay | Admitting: Neurosurgery

## 2017-07-09 DIAGNOSIS — G8929 Other chronic pain: Secondary | ICD-10-CM

## 2017-07-09 DIAGNOSIS — M545 Low back pain: Principal | ICD-10-CM

## 2017-07-25 ENCOUNTER — Ambulatory Visit
Admission: RE | Admit: 2017-07-25 | Discharge: 2017-07-25 | Disposition: A | Discharge status: Home / Self Care | Payer: Medicare Other | Source: Ambulatory Visit | Attending: Neurosurgery | Admitting: Neurosurgery

## 2017-07-25 VITALS — BP 92/44 | HR 52

## 2017-07-25 DIAGNOSIS — M5136 Other intervertebral disc degeneration, lumbar region: Secondary | ICD-10-CM

## 2017-07-25 DIAGNOSIS — M545 Low back pain: Principal | ICD-10-CM

## 2017-07-25 DIAGNOSIS — G8929 Other chronic pain: Secondary | ICD-10-CM

## 2017-07-25 DIAGNOSIS — M4316 Spondylolisthesis, lumbar region: Secondary | ICD-10-CM

## 2017-07-25 DIAGNOSIS — M51369 Other intervertebral disc degeneration, lumbar region without mention of lumbar back pain or lower extremity pain: Secondary | ICD-10-CM

## 2017-07-25 MED ORDER — MEPERIDINE HCL 50 MG/ML IJ SOLN
50.0000 mg | Freq: Once | INTRAMUSCULAR | Status: AC
Start: 2017-07-25 — End: 2017-07-25
  Administered 2017-07-25: 50 mg via INTRAMUSCULAR

## 2017-07-25 MED ORDER — ONDANSETRON HCL 4 MG/2ML IJ SOLN: 4.0000 mg | Freq: Four times a day (QID) | INTRAMUSCULAR | Status: DC | PRN

## 2017-07-25 MED ORDER — IOPAMIDOL (ISOVUE-M 200) INJECTION 41%
15.0000 mL | Freq: Once | INTRAMUSCULAR | Status: AC
  Administered 2017-07-25: 15 mL via INTRATHECAL

## 2017-07-25 MED ORDER — ONDANSETRON HCL 4 MG/2ML IJ SOLN
4.0000 mg | Freq: Once | INTRAMUSCULAR | Status: AC
  Administered 2017-07-25: 4 mg via INTRAMUSCULAR

## 2017-07-25 NOTE — Discharge Instructions (Signed)
Myelogram Discharge Instructions  1. Go home and rest quietly for the next 24 hours.  It is important to lie flat for the next 24 hours.  Get up only to go to the restroom.  You may lie in the bed or on a couch on your back, your stomach, your left side or your right side.  You may have one pillow under your head.  You may have pillows between your knees while you are on your side or under your knees while you are on your back.  2. DO NOT drive today.  Recline the seat as far back as it will go, while still wearing your seat belt, on the way home.  3. You may get up to go to the bathroom as needed.  You may sit up for 10 minutes to eat.  You may resume your normal diet and medications unless otherwise indicated.  Drink lots of extra fluids today and tomorrow.  4. The incidence of headache, nausea, or vomiting is about 5% (one in 20 patients).  If you develop a headache, lie flat and drink plenty of fluids until the headache goes away.  Caffeinated beverages may be helpful.  If you develop severe nausea and vomiting or a headache that does not go away with flat bed rest, call [phone].  5. You may resume normal activities after your 24 hours of bed rest is over; however, do not exert yourself strongly or do any heavy lifting tomorrow. If when you get up you have a headache when standing, go back to bed and force fluids for another 24 hours.  6. Call your physician for a follow-up appointment.  The results of your myelogram will be sent directly to your physician by the following day.  7. If you have any questions or if complications develop after you arrive home, please call [phone].  Discharge instructions have been explained to the patient.  The patient, or the person responsible for the patient, fully understands these instructions  YOU MAY RESTART YOUR LEXAPRO AND VYVANCE AT 10:30 TOMORROW 07/26/2017.

## 2017-07-31 ENCOUNTER — Other Ambulatory Visit: Payer: Self-pay | Admitting: Neurosurgery

## 2017-08-04 ENCOUNTER — Ambulatory Visit (INDEPENDENT_AMBULATORY_CARE_PROVIDER_SITE_OTHER): Payer: Medicare Other | Admitting: Orthopedic Surgery

## 2017-08-04 ENCOUNTER — Encounter: Payer: Self-pay | Admitting: Orthopedic Surgery

## 2017-08-04 VITALS — BP 149/100 | HR 90 | Ht 64.0 in | Wt 239.0 lb

## 2017-08-04 DIAGNOSIS — M25462 Effusion, left knee: Secondary | ICD-10-CM | POA: Diagnosis not present

## 2017-08-04 NOTE — Patient Instructions (Signed)
Steps to Quit Smoking Smoking tobacco can be bad for your health. It can also affect almost every organ in your body. Smoking puts you and people around you at risk for many serious long-lasting (chronic) diseases. Quitting smoking is hard, but it is one of the best things that you can do for your health. It is never too late to quit. What are the benefits of quitting smoking? When you quit smoking, you lower your risk for getting serious diseases and conditions. They can include:  Lung cancer or lung disease.  Heart disease.  Stroke.  Heart attack.  Not being able to have children (infertility).  Weak bones (osteoporosis) and broken bones (fractures).  If you have coughing, wheezing, and shortness of breath, those symptoms may get better when you quit. You may also get sick less often. If you are pregnant, quitting smoking can help to lower your chances of having a baby of low birth weight. What can I do to help me quit smoking? Talk with your doctor about what can help you quit smoking. Some things you can do (strategies) include:  Quitting smoking totally, instead of slowly cutting back how much you smoke over a period of time.  Going to in-person counseling. You are more likely to quit if you go to many counseling sessions.  Using resources and support systems, such as: ? Online chats with a counselor. ? Phone quitlines. ? Printed self-help materials. ? Support groups or group counseling. ? Text messaging programs. ? Mobile phone apps or applications.  Taking medicines. Some of these medicines may have nicotine in them. If you are pregnant or breastfeeding, do not take any medicines to quit smoking unless your doctor says it is okay. Talk with your doctor about counseling or other things that can help you.  Talk with your doctor about using more than one strategy at the same time, such as taking medicines while you are also going to in-person counseling. This can help make  quitting easier. What things can I do to make it easier to quit? Quitting smoking might feel very hard at first, but there is a lot that you can do to make it easier. Take these steps:  Talk to your family and friends. Ask them to support and encourage you.  Call phone quitlines, reach out to support groups, or work with a counselor.  Ask people who smoke to not smoke around you.  Avoid places that make you want (trigger) to smoke, such as: ? Bars. ? Parties. ? Smoke-break areas at work.  Spend time with people who do not smoke.  Lower the stress in your life. Stress can make you want to smoke. Try these things to help your stress: ? Getting regular exercise. ? Deep-breathing exercises. ? Yoga. ? Meditating. ? Doing a body scan. To do this, close your eyes, focus on one area of your body at a time from head to toe, and notice which parts of your body are tense. Try to relax the muscles in those areas.  Download or buy apps on your mobile phone or tablet that can help you stick to your quit plan. There are many free apps, such as QuitGuide from the CDC (Centers for Disease Control and Prevention). You can find more support from smokefree.gov and other websites.  This information is not intended to replace advice given to you by your health care provider. Make sure you discuss any questions you have with your health care provider. Document Released: 05/11/2009 Document   Revised: 03/12/2016 Document Reviewed: 11/29/2014 Elsevier Interactive Patient Education  2018 Elsevier Inc.  

## 2017-08-04 NOTE — Progress Notes (Signed)
Chief Complaint  Patient presents with  . Knee Pain    right     Patient's comes in for monthly aspiration injection left knee  Scheduled for back surgery on January 24  Left knee aspirated 6 cc of clear fluid injected Depo-Medrol 40 mg  Procedure note injection and aspiration left knee joint  Verbal consent was obtained to aspirate and inject the left knee joint   Timeout was completed to confirm the site of aspiration and injection  An 18-gauge needle was used to aspirate the left knee joint from a suprapatellar lateral approach.  The medications used were 40 mg of Depo-Medrol and 1% lidocaine 3 cc  Anesthesia was provided by ethyl chloride and the skin was prepped with alcohol.  After cleaning the skin with alcohol an 18-gauge needle was used to aspirate the right knee joint.  We obtained 6 cc of fluid  We followed this by injection of 40 mg of Depo-Medrol and 3 cc 1% lidocaine.  There were no complications. A sterile bandage was applied.

## 2017-08-15 NOTE — Pre-Procedure Instructions (Signed)
[name]  08/15/2017      [facility] - [place], [address] [place] [address] Phone: [phone] Fax: [phone]    Your procedure is scheduled on August 21, 2017.  Report to [facility] Admitting at 08:30 A.M.  Call this number if you have problems the morning of surgery:  [phone]   Remember:  Do not eat food or drink liquids after midnight.   Take these medicines the morning of surgery with A SIP OF WATER : Albuterol inhaler if needed--bring with you Diazepam (Valium) if needed Escitalopram (Lexapro) Oxycodone if needed Pantoprazole (Protonix) Gabapentin (Neurontin) Mirabegron ER (Myrbetriq)  7 days prior to surgery STOP taking any Aspirin(unless otherwise instructed by your surgeon), Diclofenac, Aleve, Naproxen, Ibuprofen, Motrin, Advil, Goody's, BC powder, all herbal medications, fish oil, and all vitamins.    Do not wear jewelry, make-up or nail polish.  Do not wear lotions, powders, or perfumes, or deodorant.  Do not shave 48 hours prior to surgery.    Do not bring valuables to the hospital.  [facility] is not responsible for any belongings or valuables.  Contacts, dentures or bridgework may not be worn into surgery.  Leave your suitcase in the car.  After surgery it may be brought to your room.  For patients admitted to the hospital, discharge time will be determined by your treatment team.  Patients discharged the day of surgery will not be allowed to drive home.   Special instructions:   [place]- Preparing For Surgery  Before surgery, you can play an important role. Because skin is not sterile, your skin needs to be as free of germs as possible. You can reduce the number of germs on your skin by washing with CHG (chlorahexidine gluconate) Soap before surgery.  CHG is an antiseptic cleaner which kills germs and bonds with the skin to continue killing germs even after washing.  Please do  not use if you have an allergy to CHG or antibacterial soaps. If your skin becomes reddened/irritated stop using the CHG.  Do not shave (including legs and underarms) for at least 48 hours prior to first CHG shower. It is OK to shave your face.  Please follow these instructions carefully.   1. Shower the NIGHT BEFORE SURGERY and the MORNING OF SURGERY with CHG.   2. If you chose to wash your hair, wash your hair first as usual with your normal shampoo.  3. After you shampoo, rinse your hair and body thoroughly to remove the shampoo.  4. Use CHG as you would any other liquid soap. You can apply CHG directly to the skin and wash gently with a scrungie or a clean washcloth.   5. Apply the CHG Soap to your body ONLY FROM THE NECK DOWN.  Do not use on open wounds or open sores. Avoid contact with your eyes, ears, mouth and genitals (private parts). Wash Face and genitals (private parts)  with your normal soap.  6. Wash thoroughly, paying special attention to the area where your surgery will be performed.  7. Thoroughly rinse your body with warm water from the neck down.  8. DO NOT shower/wash with your normal soap after using and rinsing off the CHG Soap.  9. Pat yourself dry with a CLEAN TOWEL.  10. Wear CLEAN PAJAMAS to bed the night before surgery, wear comfortable clothes the morning of surgery  11. Place CLEAN SHEETS on your bed the  night of your first shower and DO NOT SLEEP WITH PETS.    Day of Surgery: Do not apply any deodorants/lotions. Please wear clean clothes to the hospital/surgery center.      Please read over the following fact sheets that you were given.

## 2017-08-18 ENCOUNTER — Other Ambulatory Visit: Payer: Self-pay

## 2017-08-18 ENCOUNTER — Encounter (HOSPITAL_COMMUNITY)
Admission: RE | Admit: 2017-08-18 | Discharge: 2017-08-18 | Disposition: A | Discharge status: Home / Self Care | Payer: Medicare Other | Source: Ambulatory Visit | Attending: Neurosurgery | Admitting: Neurosurgery

## 2017-08-18 ENCOUNTER — Encounter (HOSPITAL_COMMUNITY): Payer: Self-pay

## 2017-08-18 DIAGNOSIS — M4317 Spondylolisthesis, lumbosacral region: Secondary | ICD-10-CM | POA: Insufficient documentation

## 2017-08-18 DIAGNOSIS — Z01812 Encounter for preprocedural laboratory examination: Secondary | ICD-10-CM | POA: Insufficient documentation

## 2017-08-18 DIAGNOSIS — Z0183 Encounter for blood typing: Secondary | ICD-10-CM | POA: Insufficient documentation

## 2017-08-18 LAB — COMPREHENSIVE METABOLIC PANEL
ALT: 25 U/L (ref 14–54)
AST: 21 U/L (ref 15–41)
Albumin: 3.8 g/dL (ref 3.5–5.0)
Alkaline Phosphatase: 111 U/L (ref 38–126)
Anion gap: 11 (ref 5–15)
BUN: 15 mg/dL (ref 6–20)
CHLORIDE: 104 mmol/L (ref 101–111)
CO2: 22 mmol/L (ref 22–32)
CREATININE: 0.8 mg/dL (ref 0.44–1.00)
Calcium: 9.3 mg/dL (ref 8.9–10.3)
GFR calc Af Amer: >60 mL/min (ref >60)
GFR calc non Af Amer: >60 mL/min (ref >60)
Glucose, Bld: 100 mg/dL — ABNORMAL HIGH (ref 65–99)
Potassium: 4.1 mmol/L (ref 3.5–5.1)
SODIUM: 137 mmol/L (ref 135–145)
Total Bilirubin: 0.5 mg/dL (ref 0.3–1.2)
Total Protein: 7.1 g/dL (ref 6.5–8.1)

## 2017-08-18 LAB — CBC
HCT: 43.7 % (ref 36.0–46.0)
Hemoglobin: 14.4 g/dL (ref 12.0–15.0)
MCH: 31.1 pg (ref 26.0–34.0)
MCHC: 33 g/dL (ref 30.0–36.0)
MCV: 94.4 fL (ref 78.0–100.0)
PLATELETS: 349 10*3/uL (ref 150–400)
RBC: 4.63 MIL/uL (ref 3.87–5.11)
RDW: 13.1 % (ref 11.5–15.5)
WBC: 10.3 10*3/uL (ref 4.0–10.5)

## 2017-08-18 LAB — SURGICAL PCR SCREEN
MRSA, PCR: NEGATIVE
STAPHYLOCOCCUS AUREUS: POSITIVE — AB

## 2017-08-18 LAB — HCG, SERUM, QUALITATIVE: Preg, Serum: NEGATIVE

## 2017-08-19 LAB — TYPE AND SCREEN
ABO/RH(D): A NEG
Antibody Screen: NEGATIVE

## 2017-08-21 ENCOUNTER — Inpatient Hospital Stay (HOSPITAL_COMMUNITY): Payer: Medicare Other | Admitting: Certified Registered"

## 2017-08-21 ENCOUNTER — Encounter (HOSPITAL_COMMUNITY): Payer: Self-pay

## 2017-08-21 ENCOUNTER — Inpatient Hospital Stay (HOSPITAL_COMMUNITY)
Admission: RE | Admit: 2017-08-21 | Discharge: 2017-08-22 | DRG: 454 | Disposition: A | Discharge status: Home / Self Care | Payer: Medicare Other | Source: Ambulatory Visit | Attending: Neurosurgery | Admitting: Neurosurgery

## 2017-08-21 ENCOUNTER — Encounter (HOSPITAL_COMMUNITY)
Admission: RE | Disposition: A | Discharge status: Home / Self Care | Payer: Self-pay | Source: Ambulatory Visit | Attending: Neurosurgery

## 2017-08-21 ENCOUNTER — Inpatient Hospital Stay (HOSPITAL_COMMUNITY): Payer: Medicare Other

## 2017-08-21 DIAGNOSIS — Z91048 Other nonmedicinal substance allergy status: Secondary | ICD-10-CM | POA: Diagnosis not present

## 2017-08-21 DIAGNOSIS — M5137 Other intervertebral disc degeneration, lumbosacral region: Secondary | ICD-10-CM | POA: Diagnosis present

## 2017-08-21 DIAGNOSIS — M4807 Spinal stenosis, lumbosacral region: Secondary | ICD-10-CM | POA: Diagnosis present

## 2017-08-21 DIAGNOSIS — E669 Obesity, unspecified: Secondary | ICD-10-CM | POA: Diagnosis present

## 2017-08-21 DIAGNOSIS — Z885 Allergy status to narcotic agent status: Secondary | ICD-10-CM

## 2017-08-21 DIAGNOSIS — Z8521 Personal history of malignant neoplasm of larynx: Secondary | ICD-10-CM

## 2017-08-21 DIAGNOSIS — M5416 Radiculopathy, lumbar region: Secondary | ICD-10-CM | POA: Diagnosis present

## 2017-08-21 DIAGNOSIS — Z79899 Other long term (current) drug therapy: Secondary | ICD-10-CM | POA: Diagnosis not present

## 2017-08-21 DIAGNOSIS — I739 Peripheral vascular disease, unspecified: Secondary | ICD-10-CM | POA: Diagnosis not present

## 2017-08-21 DIAGNOSIS — F17210 Nicotine dependence, cigarettes, uncomplicated: Secondary | ICD-10-CM | POA: Diagnosis present

## 2017-08-21 DIAGNOSIS — M4317 Spondylolisthesis, lumbosacral region: Secondary | ICD-10-CM | POA: Diagnosis not present

## 2017-08-21 DIAGNOSIS — K219 Gastro-esophageal reflux disease without esophagitis: Secondary | ICD-10-CM | POA: Diagnosis present

## 2017-08-21 DIAGNOSIS — F909 Attention-deficit hyperactivity disorder, unspecified type: Secondary | ICD-10-CM | POA: Diagnosis not present

## 2017-08-21 DIAGNOSIS — Y848 Other medical procedures as the cause of abnormal reaction of the patient, or of later complication, without mention of misadventure at the time of the procedure: Secondary | ICD-10-CM | POA: Diagnosis present

## 2017-08-21 DIAGNOSIS — M797 Fibromyalgia: Secondary | ICD-10-CM | POA: Diagnosis present

## 2017-08-21 DIAGNOSIS — Z6841 Body Mass Index (BMI) 40.0 and over, adult: Secondary | ICD-10-CM

## 2017-08-21 DIAGNOSIS — F419 Anxiety disorder, unspecified: Secondary | ICD-10-CM | POA: Diagnosis present

## 2017-08-21 DIAGNOSIS — M542 Cervicalgia: Secondary | ICD-10-CM | POA: Diagnosis present

## 2017-08-21 DIAGNOSIS — Z419 Encounter for procedure for purposes other than remedying health state, unspecified: Secondary | ICD-10-CM

## 2017-08-21 DIAGNOSIS — J449 Chronic obstructive pulmonary disease, unspecified: Secondary | ICD-10-CM | POA: Diagnosis present

## 2017-08-21 DIAGNOSIS — Z91030 Bee allergy status: Secondary | ICD-10-CM

## 2017-08-21 DIAGNOSIS — K589 Irritable bowel syndrome without diarrhea: Secondary | ICD-10-CM | POA: Diagnosis not present

## 2017-08-21 DIAGNOSIS — G8929 Other chronic pain: Secondary | ICD-10-CM | POA: Diagnosis present

## 2017-08-21 DIAGNOSIS — Z981 Arthrodesis status: Secondary | ICD-10-CM

## 2017-08-21 DIAGNOSIS — T84098A Other mechanical complication of other internal joint prosthesis, initial encounter: Secondary | ICD-10-CM | POA: Diagnosis present

## 2017-08-21 DIAGNOSIS — Z7982 Long term (current) use of aspirin: Secondary | ICD-10-CM | POA: Diagnosis not present

## 2017-08-21 DIAGNOSIS — M549 Dorsalgia, unspecified: Secondary | ICD-10-CM | POA: Diagnosis present

## 2017-08-21 DIAGNOSIS — Y793 Surgical instruments, materials and orthopedic devices (including sutures) associated with adverse incidents: Secondary | ICD-10-CM | POA: Diagnosis present

## 2017-08-21 SURGERY — POSTERIOR LUMBAR FUSION 1 LEVEL
Anesthesia: General

## 2017-08-21 MED ORDER — ONDANSETRON HCL 4 MG/2ML IJ SOLN: 4.0000 mg | Freq: Four times a day (QID) | INTRAMUSCULAR | Status: DC | PRN

## 2017-08-21 MED ORDER — MIDAZOLAM HCL 2 MG/2ML IJ SOLN
INTRAMUSCULAR | Status: AC
  Filled 2017-08-21: qty 2

## 2017-08-21 MED ORDER — ONDANSETRON HCL 4 MG PO TABS: 4.0000 mg | ORAL_TABLET | Freq: Four times a day (QID) | ORAL | Status: DC | PRN

## 2017-08-21 MED ORDER — CHLORHEXIDINE GLUCONATE CLOTH 2 % EX PADS: 6.0000 | MEDICATED_PAD | Freq: Once | CUTANEOUS | Status: DC

## 2017-08-21 MED ORDER — LACTATED RINGERS IV SOLN
INTRAVENOUS | Status: DC
  Administered 2017-08-21 (×3): via INTRAVENOUS

## 2017-08-21 MED ORDER — ROCURONIUM BROMIDE 10 MG/ML (PF) SYRINGE
PREFILLED_SYRINGE | INTRAVENOUS | Status: AC
  Filled 2017-08-21: qty 5

## 2017-08-21 MED ORDER — OXYCODONE HCL 5 MG PO TABS
15.0000 mg | ORAL_TABLET | ORAL | Status: DC | PRN
  Administered 2017-08-21 – 2017-08-22 (×4): 15 mg via ORAL
  Filled 2017-08-21 (×4): qty 3

## 2017-08-21 MED ORDER — ROCURONIUM BROMIDE 100 MG/10ML IV SOLN
INTRAVENOUS | Status: DC | PRN
  Administered 2017-08-21: 30 mg via INTRAVENOUS
  Administered 2017-08-21: 20 mg via INTRAVENOUS
  Administered 2017-08-21: 50 mg via INTRAVENOUS
  Administered 2017-08-21: 30 mg via INTRAVENOUS

## 2017-08-21 MED ORDER — FENTANYL CITRATE (PF) 100 MCG/2ML IJ SOLN
INTRAMUSCULAR | Status: AC
  Filled 2017-08-21: qty 2

## 2017-08-21 MED ORDER — GABAPENTIN 300 MG PO CAPS
300.0000 mg | ORAL_CAPSULE | Freq: Four times a day (QID) | ORAL | Status: DC
  Administered 2017-08-21 – 2017-08-22 (×3): 300 mg via ORAL
  Filled 2017-08-21 (×3): qty 1

## 2017-08-21 MED ORDER — DEXMEDETOMIDINE HCL IN NACL 200 MCG/50ML IV SOLN
INTRAVENOUS | Status: DC | PRN
  Administered 2017-08-21 (×5): 4 ug via INTRAVENOUS

## 2017-08-21 MED ORDER — SODIUM CHLORIDE 0.9% FLUSH
3.0000 mL | INTRAVENOUS | Status: DC | PRN
Start: 2017-08-21 — End: 2017-08-22

## 2017-08-21 MED ORDER — VITAMIN D 1000 UNITS PO TABS: 5000.0000 [IU] | ORAL_TABLET | Freq: Every day | ORAL | Status: DC

## 2017-08-21 MED ORDER — ONDANSETRON HCL 4 MG/2ML IJ SOLN
INTRAMUSCULAR | Status: DC | PRN
  Administered 2017-08-21: 4 mg via INTRAVENOUS

## 2017-08-21 MED ORDER — BACITRACIN ZINC 500 UNIT/GM EX OINT
TOPICAL_OINTMENT | CUTANEOUS | Status: DC | PRN
  Administered 2017-08-21: 1 via TOPICAL

## 2017-08-21 MED ORDER — BACITRACIN ZINC 500 UNIT/GM EX OINT
TOPICAL_OINTMENT | CUTANEOUS | Status: AC
  Filled 2017-08-21: qty 28.35

## 2017-08-21 MED ORDER — MIRABEGRON ER 50 MG PO TB24
50.0000 mg | ORAL_TABLET | Freq: Every day | ORAL | Status: DC
  Administered 2017-08-22: 50 mg via ORAL
  Filled 2017-08-21: qty 1

## 2017-08-21 MED ORDER — FENTANYL CITRATE (PF) 250 MCG/5ML IJ SOLN
INTRAMUSCULAR | Status: AC
  Filled 2017-08-21: qty 5

## 2017-08-21 MED ORDER — KETAMINE HCL-SODIUM CHLORIDE 100-0.9 MG/10ML-% IV SOSY
PREFILLED_SYRINGE | INTRAVENOUS | Status: AC
  Filled 2017-08-21: qty 10

## 2017-08-21 MED ORDER — LIDOCAINE HCL (CARDIAC) 20 MG/ML IV SOLN
INTRAVENOUS | Status: DC | PRN
  Administered 2017-08-21: 80 mg via INTRAVENOUS

## 2017-08-21 MED ORDER — CEFAZOLIN SODIUM-DEXTROSE 2-4 GM/100ML-% IV SOLN
2.0000 g | INTRAVENOUS | Status: AC
  Administered 2017-08-21 (×2): 2 g via INTRAVENOUS

## 2017-08-21 MED ORDER — BUPIVACAINE-EPINEPHRINE (PF) 0.5% -1:200000 IJ SOLN
INTRAMUSCULAR | Status: DC | PRN
  Administered 2017-08-21: 10 mL

## 2017-08-21 MED ORDER — BUPIVACAINE LIPOSOME 1.3 % IJ SUSP
20.0000 mL | INTRAMUSCULAR | Status: AC
  Administered 2017-08-21: 20 mL
  Filled 2017-08-21: qty 20

## 2017-08-21 MED ORDER — DEXAMETHASONE SODIUM PHOSPHATE 10 MG/ML IJ SOLN
INTRAMUSCULAR | Status: DC | PRN
  Administered 2017-08-21: 10 mg via INTRAVENOUS

## 2017-08-21 MED ORDER — 0.9 % SODIUM CHLORIDE (POUR BTL) OPTIME
TOPICAL | Status: DC | PRN
  Administered 2017-08-21: 1000 mL

## 2017-08-21 MED ORDER — VARENICLINE TARTRATE 1 MG PO TABS
1.0000 mg | ORAL_TABLET | Freq: Two times a day (BID) | ORAL | Status: DC
  Administered 2017-08-21 – 2017-08-22 (×2): 1 mg via ORAL
  Filled 2017-08-21 (×2): qty 1

## 2017-08-21 MED ORDER — PHENOL 1.4 % MT LIQD: 1.0000 | OROMUCOSAL | Status: DC | PRN

## 2017-08-21 MED ORDER — ZOLPIDEM TARTRATE 5 MG PO TABS: 5.0000 mg | ORAL_TABLET | Freq: Every evening | ORAL | Status: DC | PRN

## 2017-08-21 MED ORDER — FENTANYL CITRATE (PF) 100 MCG/2ML IJ SOLN
25.0000 ug | INTRAMUSCULAR | Status: DC | PRN
  Administered 2017-08-21 (×2): 25 ug via INTRAVENOUS

## 2017-08-21 MED ORDER — KETAMINE HCL 10 MG/ML IJ SOLN
INTRAMUSCULAR | Status: DC | PRN
  Administered 2017-08-21 (×2): 15 mg via INTRAVENOUS
  Administered 2017-08-21: 20 mg via INTRAVENOUS
  Administered 2017-08-21: 50 mg via INTRAVENOUS

## 2017-08-21 MED ORDER — PROPOFOL 10 MG/ML IV BOLUS
INTRAVENOUS | Status: AC
  Filled 2017-08-21: qty 20

## 2017-08-21 MED ORDER — HYDROMORPHONE HCL 1 MG/ML IJ SOLN
0.5000 mg | INTRAMUSCULAR | Status: DC | PRN
  Administered 2017-08-21: 1 mg via INTRAVENOUS
  Administered 2017-08-22: 0.5 mg via INTRAVENOUS
  Filled 2017-08-21 (×2): qty 1

## 2017-08-21 MED ORDER — LIDOCAINE 2% (20 MG/ML) 5 ML SYRINGE
INTRAMUSCULAR | Status: AC
  Filled 2017-08-21: qty 5

## 2017-08-21 MED ORDER — MUPIROCIN 2 % EX OINT
1.0000 "application " | TOPICAL_OINTMENT | Freq: Two times a day (BID) | CUTANEOUS | Status: DC
  Administered 2017-08-21 – 2017-08-22 (×2): 1 via NASAL
  Filled 2017-08-21: qty 22

## 2017-08-21 MED ORDER — DOCUSATE SODIUM 100 MG PO CAPS
100.0000 mg | ORAL_CAPSULE | Freq: Two times a day (BID) | ORAL | Status: DC
  Administered 2017-08-21 – 2017-08-22 (×2): 100 mg via ORAL
  Filled 2017-08-21 (×2): qty 1

## 2017-08-21 MED ORDER — OXYCODONE HCL 5 MG PO TABS
ORAL_TABLET | ORAL | Status: AC
  Filled 2017-08-21: qty 3

## 2017-08-21 MED ORDER — VANCOMYCIN HCL 1000 MG IV SOLR
INTRAVENOUS | Status: DC | PRN
  Administered 2017-08-21: 1000 mg via TOPICAL

## 2017-08-21 MED ORDER — BISACODYL 10 MG RE SUPP: 10.0000 mg | Freq: Every day | RECTAL | Status: DC | PRN

## 2017-08-21 MED ORDER — PHENYLEPHRINE HCL 10 MG/ML IJ SOLN
INTRAVENOUS | Status: DC | PRN
  Administered 2017-08-21: 30 ug/min via INTRAVENOUS

## 2017-08-21 MED ORDER — DIAZEPAM 5 MG PO TABS
5.0000 mg | ORAL_TABLET | Freq: Four times a day (QID) | ORAL | Status: DC | PRN
  Administered 2017-08-21 – 2017-08-22 (×3): 5 mg via ORAL
  Filled 2017-08-21 (×3): qty 1

## 2017-08-21 MED ORDER — ACETAMINOPHEN 500 MG PO TABS
1000.0000 mg | ORAL_TABLET | Freq: Four times a day (QID) | ORAL | Status: AC
  Administered 2017-08-21 – 2017-08-22 (×4): 1000 mg via ORAL
  Filled 2017-08-21 (×4): qty 2

## 2017-08-21 MED ORDER — BUPIVACAINE-EPINEPHRINE (PF) 0.5% -1:200000 IJ SOLN
INTRAMUSCULAR | Status: AC
  Filled 2017-08-21: qty 30

## 2017-08-21 MED ORDER — SUGAMMADEX SODIUM 200 MG/2ML IV SOLN
INTRAVENOUS | Status: DC | PRN
  Administered 2017-08-21: 220 mg via INTRAVENOUS

## 2017-08-21 MED ORDER — PROPOFOL 10 MG/ML IV BOLUS
INTRAVENOUS | Status: DC | PRN
  Administered 2017-08-21: 200 mg via INTRAVENOUS

## 2017-08-21 MED ORDER — DEXAMETHASONE SODIUM PHOSPHATE 10 MG/ML IJ SOLN
INTRAMUSCULAR | Status: AC
  Filled 2017-08-21: qty 1

## 2017-08-21 MED ORDER — ACETAMINOPHEN 650 MG RE SUPP: 650.0000 mg | RECTAL | Status: DC | PRN

## 2017-08-21 MED ORDER — SODIUM CHLORIDE 0.9% FLUSH
3.0000 mL | Freq: Two times a day (BID) | INTRAVENOUS | Status: DC
  Administered 2017-08-21: 3 mL via INTRAVENOUS

## 2017-08-21 MED ORDER — VANCOMYCIN HCL 1000 MG IV SOLR
INTRAVENOUS | Status: AC
  Filled 2017-08-21: qty 1000

## 2017-08-21 MED ORDER — SUGAMMADEX SODIUM 500 MG/5ML IV SOLN
INTRAVENOUS | Status: AC
  Filled 2017-08-21: qty 5

## 2017-08-21 MED ORDER — PANTOPRAZOLE SODIUM 40 MG PO TBEC
40.0000 mg | DELAYED_RELEASE_TABLET | Freq: Two times a day (BID) | ORAL | Status: DC
  Administered 2017-08-21 – 2017-08-22 (×2): 40 mg via ORAL
  Filled 2017-08-21 (×2): qty 1

## 2017-08-21 MED ORDER — ALBUTEROL SULFATE (2.5 MG/3ML) 0.083% IN NEBU: 3.0000 mL | INHALATION_SOLUTION | Freq: Four times a day (QID) | RESPIRATORY_TRACT | Status: DC | PRN

## 2017-08-21 MED ORDER — CYCLOBENZAPRINE HCL 10 MG PO TABS
ORAL_TABLET | ORAL | Status: AC
  Filled 2017-08-21: qty 1

## 2017-08-21 MED ORDER — CEFAZOLIN SODIUM-DEXTROSE 2-4 GM/100ML-% IV SOLN
INTRAVENOUS | Status: AC
  Filled 2017-08-21: qty 100

## 2017-08-21 MED ORDER — CYCLOBENZAPRINE HCL 10 MG PO TABS: 10.0000 mg | ORAL_TABLET | Freq: Three times a day (TID) | ORAL | Status: DC | PRN

## 2017-08-21 MED ORDER — CEFAZOLIN SODIUM-DEXTROSE 2-4 GM/100ML-% IV SOLN
2.0000 g | Freq: Three times a day (TID) | INTRAVENOUS | Status: AC
Start: 2017-08-21 — End: 2017-08-22
  Administered 2017-08-21 – 2017-08-22 (×2): 2 g via INTRAVENOUS
  Filled 2017-08-21 (×2): qty 100

## 2017-08-21 MED ORDER — SODIUM CHLORIDE 0.9 % IR SOLN
Status: DC | PRN
  Administered 2017-08-21: 15:00:00

## 2017-08-21 MED ORDER — HYOSCYAMINE SULFATE 0.125 MG SL SUBL
0.1250 mg | SUBLINGUAL_TABLET | SUBLINGUAL | Status: DC | PRN
  Filled 2017-08-21: qty 1

## 2017-08-21 MED ORDER — ESCITALOPRAM OXALATE 20 MG PO TABS: 20.0000 mg | ORAL_TABLET | Freq: Every day | ORAL | Status: DC

## 2017-08-21 MED ORDER — AMPHETAMINE-DEXTROAMPHETAMINE 10 MG PO TABS
10.0000 mg | ORAL_TABLET | Freq: Two times a day (BID) | ORAL | Status: DC
  Administered 2017-08-21 – 2017-08-22 (×2): 10 mg via ORAL
  Filled 2017-08-21 (×2): qty 1

## 2017-08-21 MED ORDER — ONDANSETRON HCL 4 MG/2ML IJ SOLN
INTRAMUSCULAR | Status: AC
  Filled 2017-08-21: qty 2

## 2017-08-21 MED ORDER — POLYETHYL GLYCOL-PROPYL GLYCOL 0.4-0.3 % OP SOLN: 1.0000 [drp] | Freq: Three times a day (TID) | OPHTHALMIC | Status: DC | PRN

## 2017-08-21 MED ORDER — POLYVINYL ALCOHOL 1.4 % OP SOLN
1.0000 [drp] | Freq: Three times a day (TID) | OPHTHALMIC | Status: DC | PRN
  Administered 2017-08-21: 1 [drp] via OPHTHALMIC
  Filled 2017-08-21: qty 15

## 2017-08-21 MED ORDER — THROMBIN (RECOMBINANT) 5000 UNITS EX SOLR
OROMUCOSAL | Status: DC | PRN
  Administered 2017-08-21: 15:00:00 via TOPICAL

## 2017-08-21 MED ORDER — FENTANYL CITRATE (PF) 100 MCG/2ML IJ SOLN
INTRAMUSCULAR | Status: DC | PRN
  Administered 2017-08-21 (×5): 50 ug via INTRAVENOUS
  Administered 2017-08-21: 100 ug via INTRAVENOUS
  Administered 2017-08-21: 50 ug via INTRAVENOUS

## 2017-08-21 MED ORDER — MIDAZOLAM HCL 5 MG/5ML IJ SOLN
INTRAMUSCULAR | Status: DC | PRN
  Administered 2017-08-21: 2 mg via INTRAVENOUS

## 2017-08-21 MED ORDER — MENTHOL 3 MG MT LOZG: 1.0000 | LOZENGE | OROMUCOSAL | Status: DC | PRN

## 2017-08-21 MED ORDER — TOPIRAMATE 25 MG PO TABS
50.0000 mg | ORAL_TABLET | Freq: Every day | ORAL | Status: DC
  Administered 2017-08-21: 50 mg via ORAL
  Filled 2017-08-21: qty 2

## 2017-08-21 MED ORDER — MORPHINE SULFATE (PF) 4 MG/ML IV SOLN: 4.0000 mg | INTRAVENOUS | Status: DC | PRN

## 2017-08-21 MED ORDER — THROMBIN (RECOMBINANT) 5000 UNITS EX SOLR
CUTANEOUS | Status: AC
  Filled 2017-08-21: qty 5000

## 2017-08-21 MED ORDER — ALBUMIN HUMAN 5 % IV SOLN
INTRAVENOUS | Status: DC | PRN
  Administered 2017-08-21: 14:00:00 via INTRAVENOUS

## 2017-08-21 MED ORDER — ACETAMINOPHEN 325 MG PO TABS: 650.0000 mg | ORAL_TABLET | ORAL | Status: DC | PRN

## 2017-08-21 MED ORDER — SODIUM CHLORIDE 0.9 % IV SOLN: 250.0000 mL | INTRAVENOUS | Status: DC

## 2017-08-21 SURGICAL SUPPLY — 72 items
APL SKNCLS STERI-STRIP NONHPOA (GAUZE/BANDAGES/DRESSINGS) ×1
BAG DECANTER FOR FLEXI CONT (MISCELLANEOUS) ×3 IMPLANT
BENZOIN TINCTURE PRP APPL 2/3 (GAUZE/BANDAGES/DRESSINGS) ×3 IMPLANT
BLADE CLIPPER SURG (BLADE) IMPLANT
BUR MATCHSTICK NEURO 3.0 LAGG (BURR) ×3 IMPLANT
BUR PRECISION FLUTE 6.0 (BURR) ×3 IMPLANT
CAGE ALTERA 10X31X9-13 15D (Cage) ×1 IMPLANT
CAGE ALTERA 9-13-15-31MM (Cage) ×1 IMPLANT
CANISTER SUCT 3000ML PPV (MISCELLANEOUS) ×3 IMPLANT
CARTRIDGE OIL MAESTRO DRILL (MISCELLANEOUS) ×1 IMPLANT
CLOSURE WOUND 1/2 X4 (GAUZE/BANDAGES/DRESSINGS) ×2
CONNECT LAT OFFSET L25D 30 (Connector) ×3 IMPLANT
CONNECTOR LAT OFFSET L25D 30 (Connector) IMPLANT
CONNECTOR LATERAL 30MM S00 (Connector) ×2 IMPLANT
CONT SPEC 4OZ CLIKSEAL STRL BL (MISCELLANEOUS) ×3 IMPLANT
COVER BACK TABLE 60X90IN (DRAPES) ×6 IMPLANT
DECANTER SPIKE VIAL GLASS SM (MISCELLANEOUS) ×3 IMPLANT
DIFFUSER DRILL AIR PNEUMATIC (MISCELLANEOUS) ×3 IMPLANT
DRAPE C-ARM 42X72 X-RAY (DRAPES) ×8 IMPLANT
DRAPE C-ARMOR (DRAPES) ×2 IMPLANT
DRAPE HALF SHEET 40X57 (DRAPES) ×3 IMPLANT
DRAPE LAPAROTOMY 100X72X124 (DRAPES) ×3 IMPLANT
DRAPE SURG 17X23 STRL (DRAPES) ×12 IMPLANT
ELECT BLADE 4.0 EZ CLEAN MEGAD (MISCELLANEOUS) ×3
ELECT REM PT RETURN 9FT ADLT (ELECTROSURGICAL) ×3
ELECTRODE BLDE 4.0 EZ CLN MEGD (MISCELLANEOUS) ×1 IMPLANT
ELECTRODE REM PT RTRN 9FT ADLT (ELECTROSURGICAL) ×1 IMPLANT
EVACUATOR 1/8 PVC DRAIN (DRAIN) IMPLANT
GAUZE SPONGE 4X4 12PLY STRL (GAUZE/BANDAGES/DRESSINGS) ×3 IMPLANT
GAUZE SPONGE 4X4 16PLY XRAY LF (GAUZE/BANDAGES/DRESSINGS) ×3 IMPLANT
GLOVE BIO SURGEON STRL SZ8 (GLOVE) ×6 IMPLANT
GLOVE BIO SURGEON STRL SZ8.5 (GLOVE) ×6 IMPLANT
GLOVE EXAM NITRILE LRG STRL (GLOVE) IMPLANT
GLOVE EXAM NITRILE XL STR (GLOVE) IMPLANT
GLOVE EXAM NITRILE XS STR PU (GLOVE) IMPLANT
GOWN STRL REUS W/ TWL LRG LVL3 (GOWN DISPOSABLE) IMPLANT
GOWN STRL REUS W/ TWL XL LVL3 (GOWN DISPOSABLE) ×2 IMPLANT
GOWN STRL REUS W/TWL 2XL LVL3 (GOWN DISPOSABLE) IMPLANT
GOWN STRL REUS W/TWL LRG LVL3 (GOWN DISPOSABLE) ×3
GOWN STRL REUS W/TWL XL LVL3 (GOWN DISPOSABLE) ×6
HEMOSTAT POWDER KIT SURGIFOAM (HEMOSTASIS) ×3 IMPLANT
KIT BASIN OR (CUSTOM PROCEDURE TRAY) ×3 IMPLANT
KIT INFUSE SMALL (Orthopedic Implant) ×2 IMPLANT
KIT ROOM TURNOVER OR (KITS) ×3 IMPLANT
MILL MEDIUM DISP (BLADE) ×3 IMPLANT
NDL HYPO 21X1.5 SAFETY (NEEDLE) IMPLANT
NEEDLE HYPO 21X1.5 SAFETY (NEEDLE) IMPLANT
NEEDLE HYPO 22GX1.5 SAFETY (NEEDLE) ×3 IMPLANT
NS IRRIG 1000ML POUR BTL (IV SOLUTION) ×3 IMPLANT
OIL CARTRIDGE MAESTRO DRILL (MISCELLANEOUS) ×3
PACK LAMINECTOMY NEURO (CUSTOM PROCEDURE TRAY) ×3 IMPLANT
PAD ARMBOARD 7.5X6 YLW CONV (MISCELLANEOUS) ×9 IMPLANT
PATTIES SURGICAL .5 X1 (DISPOSABLE) IMPLANT
ROD TI 5.5X450 STRAIGHT (Rod) ×2 IMPLANT
SCREW POLY OPEN ILIAC 8.5X70 (Screw) ×4 IMPLANT
SCREW VITALITY PA 7.5X45MM (Screw) ×4 IMPLANT
SPONGE LAP 4X18 X RAY DECT (DISPOSABLE) IMPLANT
SPONGE NEURO XRAY DETECT 1X3 (DISPOSABLE) IMPLANT
SPONGE SURGIFOAM ABS GEL 100 (HEMOSTASIS) IMPLANT
STRIP BIOACTIVE 20CC 25X100X8 (Miscellaneous) ×2 IMPLANT
STRIP CLOSURE SKIN 1/2X4 (GAUZE/BANDAGES/DRESSINGS) ×3 IMPLANT
SUT VIC AB 1 CT1 18XBRD ANBCTR (SUTURE) ×2 IMPLANT
SUT VIC AB 1 CT1 8-18 (SUTURE) ×9
SUT VIC AB 2-0 CP2 18 (SUTURE) ×8 IMPLANT
SYR 20CC LL (SYRINGE) ×2 IMPLANT
TAPE CLOTH SURG 4X10 WHT LF (GAUZE/BANDAGES/DRESSINGS) ×2 IMPLANT
TOP CLOSURE TORQ LIMIT (Neuro Prosthesis/Implant) ×16 IMPLANT
TOP CLSR SEQUOIA (Orthopedic Implant) ×16 IMPLANT
TOWEL GREEN STERILE (TOWEL DISPOSABLE) ×3 IMPLANT
TOWEL GREEN STERILE FF (TOWEL DISPOSABLE) ×3 IMPLANT
TRAY FOLEY W/METER SILVER 16FR (SET/KITS/TRAYS/PACK) ×3 IMPLANT
WATER STERILE IRR 1000ML POUR (IV SOLUTION) ×3 IMPLANT

## 2017-08-21 NOTE — Progress Notes (Signed)
Patient ID: [name], female   DOB: 1966/03/05, 52 y.o.   MRN: [id number] Subjective: The patient is alert.  She complains of back pain.  She says she cannot take Flexeril.  She requests Valium for muscle spasm.  Objective: Vital signs in last 24 hours: Temp:  [98 F (36.7 C)-98.8 F (37.1 C)] 98 F (36.7 C) (01/24 1730) Pulse Rate:  [62-76] 62 (01/24 1731) Resp:  [14-18] 18 (01/24 1731) BP: (118-122)/(70-105) 118/70 (01/24 1724) SpO2:  [91 %-100 %] 96 % (01/24 1731) Weight:  [108 kg (238 lb 3.2 oz)] 108 kg (238 lb 3.2 oz) (01/24 0904) Estimated body mass index is 40.26 kg/m as calculated from the following:   Height as of this encounter: 5' 4.5" (1.638 m).   Weight as of this encounter: 108 kg (238 lb 3.2 oz).   Intake/Output from previous day: No intake/output data recorded. Intake/Output this shift: Total I/O In: 2570 [P.O.:100; I.V.:2220; IV Piggyback:250] Out: 600 [Urine:300; Blood:300]  Physical exam patient is alert.  She is moving her lower extremities well.  Lab Results: No results for input(s): WBC, HGB, HCT, PLT in the last 72 hours. BMET No results for input(s): NA, K, CL, CO2, GLUCOSE, BUN, CREATININE, CALCIUM in the last 72 hours.  Studies/Results: Dg Lumbar Spine Complete  Result Date: 08/21/2017 CLINICAL DATA:  52 year old female with a history of fusion EXAM: LUMBAR SPINE - COMPLETE 4+ VIEW; DG C-ARM 61-120 MIN COMPARISON:  07/25/2017 FINDINGS: Limited intraoperative fluoroscopic spot images of lumbosacral iliac fixation demonstrating bilateral pedicle screws of L5 and S1, with bilateral sacroiliac screws. Disc spacers at L4-L5 and L5-S1. IMPRESSION: Limited intraoperative fluoroscopic spot images during lumbosacral iliac fixation, as above. Please refer to the dictated operative report for full details of intraoperative findings and procedure. Electronically Signed   By: Corrie Mckusick D.O.   On: 08/21/2017 15:35   Dg C-arm 1-60 Min  Result Date:  08/21/2017 CLINICAL DATA:  52 year old female with a history of fusion EXAM: LUMBAR SPINE - COMPLETE 4+ VIEW; DG C-ARM 61-120 MIN COMPARISON:  07/25/2017 FINDINGS: Limited intraoperative fluoroscopic spot images of lumbosacral iliac fixation demonstrating bilateral pedicle screws of L5 and S1, with bilateral sacroiliac screws. Disc spacers at L4-L5 and L5-S1. IMPRESSION: Limited intraoperative fluoroscopic spot images during lumbosacral iliac fixation, as above. Please refer to the dictated operative report for full details of intraoperative findings and procedure. Electronically Signed   By: Corrie Mckusick D.O.   On: 08/21/2017 15:35    Assessment/Plan: The patient is doing well.  I spoke with her family.  I will change her Flexeril to Valium as requested.  LOS: 0 days     Ophelia Charter 08/21/2017, 5:35 PM

## 2017-08-21 NOTE — Anesthesia Procedure Notes (Signed)
Procedure Name: Intubation Date/Time: 08/21/2017 11:14 AM Performed by: Lance Coon, CRNA Pre-anesthesia Checklist: Patient identified, Emergency Drugs available, Suction available, Patient being monitored and Timeout performed Patient Re-evaluated:Patient Re-evaluated prior to induction Oxygen Delivery Method: Circle system utilized Preoxygenation: Pre-oxygenation with 100% oxygen Induction Type: IV induction Ventilation: Mask ventilation without difficulty Laryngoscope Size: Miller and 3 Grade View: Grade I Tube type: Oral Tube size: 7.5 mm Number of attempts: 1 Airway Equipment and Method: Stylet Placement Confirmation: ETT inserted through vocal cords under direct vision,  positive ETCO2 and breath sounds checked- equal and bilateral Secured at: 21 cm Tube secured with: Tape Dental Injury: Teeth and Oropharynx as per pre-operative assessment

## 2017-08-21 NOTE — Transfer of Care (Signed)
Immediate Anesthesia Transfer of Care Note  Patient: [name]  Procedure(s) Performed: POSTERIOR LUMBAR INTERBODY FUSION, INTERBODY PROSTHESIS, POSTERIOR SEGMENTAL INSTRUMENTATION, ILIUM SCREWS LUMBAR FIVE-SACRAL ONE (N/A )  Patient Location: PACU  Anesthesia Type:General  Level of Consciousness: awake and patient cooperative  Airway & Oxygen Therapy: Patient Spontanous Breathing and Patient connected to face mask oxygen  Post-op Assessment: Report given to RN and Post -op Vital signs reviewed and stable  Post vital signs: Reviewed and stable  Last Vitals:  Vitals:   08/21/17 0902  BP: 122/74  Pulse: 76  Resp: 18  Temp: 36.7 C  SpO2: 100%    Last Pain:  Vitals:   08/21/17 0902  TempSrc: Oral         Complications: No apparent anesthesia complications

## 2017-08-21 NOTE — H&P (Signed)
Subjective: The patient is a 52 year old white female whose had several back surgeries/lumbar fusions.  She has developed recurrent neck pain with pain rating to her legs.  She has failed medical management.  She was worked up with a lumbar myelo CT which demonstrated L5-S1 spondylolisthesis, degenerative disc disease and L5 spondylolysis.  I discussed the various treatment options with the patient.  She has decided to proceed with surgery.  Past Medical History:  Diagnosis Date  . ADHD (attention deficit hyperactivity disorder)   . Anxiety   . Arthritis    disk disease  . Barrett esophagus   . Chronic pain   . COPD (chronic obstructive pulmonary disease) ([place])   . Depression   . Dyspnea   . Fibromyalgia   . GERD (gastroesophageal reflux disease)    IBS  . Headache   . Hot flashes 04/11/2015  . IBS (irritable bowel syndrome)   . Obesity   . Peripheral arterial disease ([place])   . Plantar fasciitis of left foot   . PTSD (post-traumatic stress disorder)   . RLQ abdominal pain 04/11/2015  . 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 ANTERIOR CERVICAL DECOMP/DISCECTOMY FUSION  08/18/2012   Procedure: ANTERIOR CERVICAL DECOMPRESSION/DISCECTOMY FUSION 2 LEVELS;  Surgeon: Faythe Ghee, MD;  Location: [place] [facility];  Service: Neurosurgery;  Laterality: N/A;  anterior cervical five-six,six-seven decompression fusion plating  . ANTERIOR LAT LUMBAR FUSION  03/19/2012   Procedure: ANTERIOR LATERAL LUMBAR FUSION 1 LEVEL;  Surgeon: Faythe Ghee, MD;  Location: [place] [facility];  Service: Neurosurgery;  Laterality: Right;  Anterolateral Lumbar Interbody Fusion, Lumbar Three-Four (Hand broke through glove and nat in the room)  . BACK SURGERY    . BUNIONECTOMY Left 08/04/2014   Procedure: LEFT FOOT LAPIDUS BUNION CORRECTION, LEFT SECOND TARSALMETATARSAL JOINT DEBRIDEMENT AND LEFT MODIFIED MCBRIDE BUNIONECTOMY;  Surgeon: [name],  MD;  Location: [facility];  Service: Orthopedics;  Laterality: Left;  . CARPAL TUNNEL RELEASE Bilateral   . CHONDROPLASTY Left 12/25/2016   Procedure: CHONDROPLASTY LEFT PATELLA;  Surgeon: Carole Civil, MD;  Location: [facility];  Service: Orthopedics;  Laterality: Left;  . CLEFT PALATE REPAIR    . COLONOSCOPY N/A 02/16/2015   Procedure: COLONOSCOPY;  Surgeon: Rogene Houston, MD;  [facility];  Service: Endoscopy;  Laterality: N/A;  155  . ENDOMETRIAL ABLATION    . ESOPHAGOGASTRODUODENOSCOPY N/A 02/16/2015   Procedure: ESOPHAGOGASTRODUODENOSCOPY (EGD);  Surgeon: Rogene Houston, MD;  [facility];  Service: Endoscopy;  Laterality: N/A;  . KNEE ARTHROSCOPY WITH MEDIAL MENISECTOMY Left 12/25/2016   Procedure: KNEE ARTHROSCOPY WITH MEDIAL AND LATERAL MENISECTOMY;  Surgeon: Carole Civil, MD;  Location: [facility];  Service: Orthopedics;  Laterality: Left;  . LAPAROSCOPIC TUBAL LIGATION     [facility]  . MAXIMUM ACCESS (MAS)POSTERIOR LUMBAR INTERBODY FUSION (PLIF) 2 LEVEL  08/08/2016  . NOSE SURGERY    . SHOULDER OPEN ROTATOR CUFF REPAIR Right 04/02/2013   Procedure: ROTATOR CUFF REPAIR SHOULDER OPEN;  Surgeon: Carole Civil, MD;  Location: [facility];  Service: Orthopedics;  Laterality: Right;  . THROAT SURGERY  2000   for throat cancer/no problems intubation...[facility]  . TONSILLECTOMY    . TUBAL LIGATION      Allergies  Allergen Reactions  . Bee Venom Anaphylaxis  . Morphine And Related Other (See Comments)    Severe headache  .  Adhesive [Tape] Itching    Some types of adhesive tape and paper tape rip up the skin.      Social History   Tobacco Use  . Smoking status: Current Some Day Smoker    Packs/day: 0.50    Years: 30.00    Pack years: 15.00    Types: Cigarettes  . Smokeless tobacco: Never Used  Substance Use Topics  . Alcohol use: Yes    Comment: 08/08/2016 "few times/year"    Family History  Problem Relation Age of  Onset  . Diabetes Mother   . Hypertension Mother   . Heart disease Maternal Grandfather        sudden death  . Cancer Paternal Grandmother   . Cancer Paternal Grandfather   . Other Daughter        allergies; scolosis  . Cancer Paternal Uncle   . Cancer Paternal Uncle   . Cancer Paternal Uncle   . Cancer Paternal Uncle    Prior to Admission medications   Medication Sig Start Date End Date Taking? Authorizing Provider  albuterol (PROVENTIL HFA;VENTOLIN HFA) 108 (90 Base) MCG/ACT inhaler Inhale 2 puffs into the lungs every 6 (six) hours as needed for wheezing or shortness of breath.   Yes [provider]  amphetamine-dextroamphetamine (ADDERALL) 20 MG tablet Take 10 mg by mouth 2 (two) times daily.  03/05/17  Yes [provider]  Aspirin-Salicylamide-Caffeine (BC HEADACHE POWDER PO) Take 1 packet by mouth daily as needed (headaches).   Yes [provider]  Cholecalciferol (VITAMIN D-3) 5000 units TABS Take 5,000 Units by mouth daily.   Yes [provider]  diazepam (VALIUM) 5 MG tablet Take 1 tablet (5 mg total) by mouth 3 (three) times daily as needed for muscle spasms. Patient taking differently: Take 5 mg by mouth 3 (three) times daily as needed for anxiety (typically once daily at bedtime.).  08/10/16  Yes Pool, Mallie Mussel, MD  diclofenac (CATAFLAM) 50 MG tablet Take 1 tablet (50 mg total) by mouth 2 (two) times daily. 04/09/17  Yes Carole Civil, MD  diclofenac sodium (VOLTAREN) 1 % GEL Apply 2 g topically 4 (four) times daily as needed (pain).   Yes [provider]  docusate sodium (COLACE) 100 MG capsule Take 200 mg by mouth at bedtime.    Yes [provider]  escitalopram (LEXAPRO) 20 MG tablet Take 20 mg by mouth at bedtime.    Yes [provider]  EUCRISA 2 % OINT Apply 1 application topically every other day as needed (for ezcema on elbows.). Applied to elbows. 09/27/16  Yes [provider]  gabapentin (NEURONTIN)  300 MG capsule Take 300 mg by mouth 4 (four) times daily. 12/16/16  Yes [provider]  hyoscyamine (LEVSIN SL) 0.125 MG SL tablet Place 1 tablet (0.125 mg total) under the tongue every 4 (four) hours as needed. 02/03/17  Yes Setzer, Terri L, NP  ibuprofen (ADVIL,MOTRIN) 200 MG tablet Take 600-800 mg by mouth every 8 (eight) hours as needed (for pain.).   Yes [provider]  mirabegron ER (MYRBETRIQ) 50 MG TB24 tablet Take 50 mg by mouth daily.   Yes [provider]  oxyCODONE (ROXICODONE) 15 MG immediate release tablet Take 15 mg by mouth every 4 (four) hours as needed for pain.  03/05/17  Yes [provider]  pantoprazole (PROTONIX) 40 MG tablet Take 1 tablet (40 mg total) by mouth 2 (two) times daily. 11/09/14  Yes Setzer, Rona Ravens, NP  Polyethyl Glycol-Propyl  Glycol (LUBRICANT EYE DROPS) 0.4-0.3 % SOLN Place 1-2 drops into both eyes 3 (three) times daily as needed (for dry eyes.).   Yes [provider]  topiramate (TOPAMAX) 50 MG tablet Take 50 mg by mouth at bedtime.    Yes [provider]  varenicline (CHANTIX) 1 MG tablet Take 1 mg by mouth 2 (two) times daily.   Yes [provider]  EPINEPHrine 0.3 mg/0.3 mL IJ SOAJ injection Inject 0.3 mg into the muscle daily as needed (for anaphalytic allergic reactions).     [provider]     Review of Systems  Positive ROS: As above  All other systems have been reviewed and were otherwise negative with the exception of those mentioned in the HPI and as above.  Objective: Vital signs in last 24 hours: Temp:  [98.1 F (36.7 C)] 98.1 F (36.7 C) (01/24 0902) Pulse Rate:  [76] 76 (01/24 0902) Resp:  [18] 18 (01/24 0902) BP: (122)/(74) 122/74 (01/24 0902) SpO2:  [100 %] 100 % (01/24 0902) Weight:  [108 kg (238 lb 3.2 oz)] 108 kg (238 lb 3.2 oz) (01/24 0904) Estimated body mass index is 40.26 kg/m as calculated from the following:   Height as of this encounter: 5' 4.5" (1.638  m).   Weight as of this encounter: 108 kg (238 lb 3.2 oz).   General Appearance: Alert Head: Normocephalic, without obvious abnormality, atraumatic Eyes: PERRL, conjunctiva/corneas clear, EOM's intact,    Ears: Normal  Throat: Normal  Neck: Supple, Back: unremarkable incision is well-healed. Lungs: Clear to auscultation bilaterally, respirations unlabored Heart: Regular rate and rhythm, no murmur, rub or gallop Abdomen: Soft, obese, non-tender Extremities: Extremities normal, atraumatic, no cyanosis or edema Skin: unremarkable  NEUROLOGIC:   Mental status: alert and oriented,Motor Exam - grossly normal Sensory Exam - grossly normal Reflexes:  Coordination - grossly normal Gait - grossly normal Balance - grossly normal Cranial Nerves: I: smell Not tested  II: visual acuity  OS: Normal  OD: Normal   II: visual fields Full to confrontation  II: pupils Equal, round, reactive to light  III,VII: ptosis None  III,IV,VI: extraocular muscles  Full ROM  V: mastication Normal  V: facial light touch sensation  Normal  V,VII: corneal reflex  Present  VII: facial muscle function - upper  Normal  VII: facial muscle function - lower Normal  VIII: hearing Not tested  IX: soft palate elevation  Normal  IX,X: gag reflex Present  XI: trapezius strength  5/5  XI: sternocleidomastoid strength 5/5  XI: neck flexion strength  5/5  XII: tongue strength  Normal    Data Review Lab Results  Component Value Date   WBC 10.3 08/18/2017   HGB 14.4 08/18/2017   HCT 43.7 08/18/2017   MCV 94.4 08/18/2017   PLT 349 08/18/2017   Lab Results  Component Value Date   NA 137 08/18/2017   K 4.1 08/18/2017   CL 104 08/18/2017   CO2 22 08/18/2017   BUN 15 08/18/2017   CREATININE 0.80 08/18/2017   GLUCOSE 100 (H) 08/18/2017   No results found for: INR, PROTIME  Assessment/Plan: L5-S1 spondylolisthesis, degenerative disc disease, L5 spondylolysis, lumbago, lumbar radiculopathy, sacral foraminal  stenosis: I have discussed the situation with the patient.  I have reviewed her imaging studies with her and pointed out the abnormalities.  We have discussed the various treatment options including surgery.  I have described the surgical treatment option of a L5-S1 decompression instrumentation and fusion with exploration of  her previous fusions.  I have shown her surgical models.  I have given her a surgical pamphlet.  We have discussed the risks, benefits, alternatives, expected postoperative course, and likelihood of achieving our goals with surgery.  I have answered all the patient's questions.  She has decided to proceed with surgery.   Ophelia Charter 08/21/2017 10:46 AM

## 2017-08-21 NOTE — Op Note (Signed)
Brief history: The patient is a 52 year old white female whose had several other back surgeries/lumbar fusions.  She has developed recurrent and worsening back, buttock and leg pain.  She has failed medical management.  She was worked up with a lumbar myelo CT which demonstrated an L5-S1 spondylolisthesis, degenerative disc disease and foraminal stenosis with bilateral L5 pars defects.  I discussed the various treatment option with the patient including surgery.  She has weighed the risks, benefits, and alternatives of surgery and decided to proceed with an L5-S1 decompression, instrumentation and fusion.   Preoperative diagnosis: L5-S1 spondylolisthesis, foraminal stenosis, degenerative disc disease, spinal stenosis compressing both the L5 and the 1 nerve roots; lumbago; lumbar radiculopathy; bilateral L5 pars defects; lumbar pseudoarthrosis  Postoperative diagnosis: The same  Procedure: Bilateral L5-S1 laminotomy/foraminotomies to decompress the bilateral L5 and S1 nerve roots(the work required to do this was in addition to the work required to do the posterior lumbar interbody fusion because of the patient's spinal stenosis, facet arthropathy. Etc. requiring a wide decompression of the nerve roots.);  L5-S1 transforaminal lumbar interbody fusion with local morselized autograft bone and Kinnex graft extender; insertion of interbody prosthesis at L5-S1 (globus peek expandable interbody prosthesis); posterior segmental instrumentation from L2 to S1 with Zimmer titanium pedicle screws and rods; pelvic fixation with iliac screws( Zimmer titanium) posterior lateral arthrodesis at L2-3, L3-4, L4-5 and L5-S1 with local morselized autograft bone, bone morphogenic protein-soaked collagen sponges and Kinnex bone graft extender.  Surgeon: Dr. Earle Gell  Asst.: Dr. Sherley Bounds  Anesthesia: Gen. endotracheal  Estimated blood loss: 400 cc  Drains: None  Complications: None  Description of procedure: The  patient was brought to the operating room by the anesthesia team. General endotracheal anesthesia was induced. The patient was turned to the prone position on the Wilson frame. The patient's lumbosacral region was then prepared with Betadine scrub and Betadine solution. Sterile drapes were applied.  I then injected the area to be incised with Marcaine with epinephrine solution. I then used the scalpel to make a linear midline incision over the L2-3, L3-4, L4-5 and L5-S1 interspace. I then used electrocautery to perform a bilateral subperiosteal dissection exposing the spinous process and lamina of L1 to the upper sacrum and to expose the old hardware at L2-3, L3-4 and L4-5.We then inserted the Verstrac retractor to provide exposure.  I explored the old arthrodesis by removing the caps from the old screws at L2, L3, L4 and L5 bilaterally.  I inspected the posterior lateral arthrodesis at L2-3, L3-4 and L4-5.  I was not convinced she had a solid arthrodesis.  I began the decompression by using the high speed drill to perform laminotomies at L5-S1 bilaterally. We then used the Kerrison punches to widen the laminotomy and removed the ligamentum flavum at L5-S1 bilaterally. We used the Kerrison punches to remove the medial facets at L5-S1 bilaterally. We performed wide foraminotomies about the bilateral L5 and S1 nerve roots completing the decompression.  We now turned our attention to the posterior lumbar interbody fusion. I used a scalpel to incise the intervertebral disc at L5-S1 bilaterally. I then performed a partial intervertebral discectomy at L5-S1 bilaterally using the pituitary forceps. We prepared the vertebral endplates at J8-A4 bilaterally for the fusion by removing the soft tissues with the curettes. We then used the trial spacers to pick the appropriate sized interbody prosthesis. We prefilled his prosthesis with a combination of local morselized autograft bone that we obtained during the  decompression as well as  Kinnex bone graft extender. We inserted the prefilled prosthesis into the interspace at L5-S1, I then expanded the prosthesis. There was a good snug fit of the prosthesis in the interspace. We then filled and the remainder of the intervertebral disc space with local morselized autograft bone, bone morphogenic protein-soaked collagen sponges, and Kinnex. This completed the posterior lumbar interbody arthrodesis at L5-S1.  We now turned attention to the instrumentation. Under fluoroscopic guidance we cannulated the bilateral S1 pedicles with the bone probe. We then removed the bone probe. We then tapped the pedicle with a 6.5 millimeter tap. We then removed the tap. We probed inside the tapped pedicle with a ball probe to rule out cortical breaches. We then inserted a 7.5 x 40 millimeter pedicle screw into the S1 pedicles bilaterally under fluoroscopic guidance. We then palpated along the medial aspect of the pedicles to rule out cortical breaches. There were none.  We now turned our attention to the pelvic fixation.  I used electrocautery to dissect through the subcutaneous tissue and identified the posterior superior iliac spine bilaterally.  I used a high-speed drill to create a hole for the screw head.  Using fluoroscopic guidance I cannulated the patient's ileum bilaterally with the bone probe.  I removed the bone probe.  I probed inside the hole there were no cortical breaches.  We then tapped with a 7.5 mm tap.  I again probed inside the tap told there were no cortical breaches.  I inserted a 8.5 x 70 mm ilium screws bilaterally.  We got good bony purchase.  We then cut and contoured a rod connected the unilateral screws from L2-S1 bilaterally.  We used a side connector to connect the ilium screws to the rod.  We tightened the caps appropriately.  This completed the instrumentation  We now turned our attention to the posterior lateral arthrodesis at L2-3, L3-4 and L4-5 and L5-S1  bilaterally. We used the high-speed drill to decorticate the remainder of the facets, pars, transverse process at L5-S1.  We used electrocautery to remove the soft tissue over the facets at L2-3, L3-4 and L4-5 bilaterally.  I used a high-speed drill to decorticate these structures bilaterally.. We then applied a combination of local morselized autograft bone, bone morphogenic protein-soaked collagen sponges, and Kinnex bone graft extender over these decorticated posterior lateral structures at L2-3, L3-4 and L4-5 and L5-S1. This completed the posterior lateral arthrodesis at L2-3, L3-4, L4-5 and L5-S1.  We then obtained hemostasis using bipolar electrocautery. We irrigated the wound out with bacitracin solution. We inspected the thecal sac and nerve roots and noted they were well decompressed. We then removed the retractor. We placed vancomycin powder in the wound. We reapproximated patient's thoracolumbar fascia with interrupted #1 Vicryl suture. We reapproximated patient's subcutaneous tissue with interrupted 2-0 Vicryl suture. The reapproximated patient's skin with Steri-Strips and benzoin. The wound was then coated with bacitracin ointment. A sterile dressing was applied. The drapes were removed. The patient was subsequently returned to the supine position where they were extubated by the anesthesia team. He was then transported to the post anesthesia care unit in stable condition. All sponge instrument and needle counts were reportedly correct at the end of this case.

## 2017-08-21 NOTE — Progress Notes (Signed)
Orthopedic Tech Progress Note Patient Details:  [name] [birth year] [id number] Patient already has brace. Patient ID: [name], female   DOB: September 01, 1965, 52 y.o.   MRN: [id number]   Braulio Bosch 08/21/2017, 7:14 PM

## 2017-08-21 NOTE — Anesthesia Preprocedure Evaluation (Signed)
Anesthesia Evaluation  Patient identified by MRN, date of birth, ID band Patient awake    Airway Mallampati: II  TM Distance: >3 FB     Dental   Pulmonary shortness of breath, COPD, Current Smoker,    breath sounds clear to auscultation       Cardiovascular + Peripheral Vascular Disease   Rhythm:Regular Rate:Normal     Neuro/Psych    GI/Hepatic Neg liver ROS, GERD  ,  Endo/Other    Renal/GU negative Renal ROS     Musculoskeletal   Abdominal   Peds  Hematology   Anesthesia Other Findings   Reproductive/Obstetrics                             Anesthesia Physical Anesthesia Plan  ASA: III  Anesthesia Plan: General   Post-op Pain Management:    Induction: Intravenous  PONV Risk Score and Plan: 2  Airway Management Planned: Oral ETT  Additional Equipment:   Intra-op Plan:   Post-operative Plan: Possible Post-op intubation/ventilation  Informed Consent: I have reviewed the patients History and Physical, chart, labs and discussed the procedure including the risks, benefits and alternatives for the proposed anesthesia with the patient or authorized representative who has indicated his/her understanding and acceptance.   Dental advisory given  Plan Discussed with: CRNA and Anesthesiologist  Anesthesia Plan Comments:         Anesthesia Quick Evaluation

## 2017-08-21 NOTE — Anesthesia Postprocedure Evaluation (Signed)
Anesthesia Post Note  Patient: [name]  Procedure(s) Performed: POSTERIOR LUMBAR INTERBODY FUSION, INTERBODY PROSTHESIS, POSTERIOR SEGMENTAL INSTRUMENTATION, ILIUM SCREWS LUMBAR FIVE-SACRAL ONE (N/A )     Patient location during evaluation: PACU Anesthesia Type: General Level of consciousness: awake Pain management: pain level controlled Vital Signs Assessment: post-procedure vital signs reviewed and stable Respiratory status: spontaneous breathing Cardiovascular status: stable Anesthetic complications: no    Last Vitals:  Vitals:   08/21/17 1731 08/21/17 1800  BP:  121/72  Pulse: 62 64  Resp: 18 18  Temp:  36.8 C  SpO2: 96% 93%    Last Pain:  Vitals:   08/21/17 1820  TempSrc:   PainSc: 8                  [name]

## 2017-08-22 LAB — BASIC METABOLIC PANEL
ANION GAP: 11 (ref 5–15)
BUN: 15 mg/dL (ref 6–20)
CALCIUM: 8.6 mg/dL — AB (ref 8.9–10.3)
CO2: 24 mmol/L (ref 22–32)
CREATININE: 0.83 mg/dL (ref 0.44–1.00)
Chloride: 103 mmol/L (ref 101–111)
GFR calc Af Amer: >60 mL/min (ref >60)
GLUCOSE: 123 mg/dL — AB (ref 65–99)
Potassium: 3.7 mmol/L (ref 3.5–5.1)
Sodium: 138 mmol/L (ref 135–145)

## 2017-08-22 LAB — CBC
HCT: 34.3 % — ABNORMAL LOW (ref 36.0–46.0)
Hemoglobin: 10.9 g/dL — ABNORMAL LOW (ref 12.0–15.0)
MCH: 30.5 pg (ref 26.0–34.0)
MCHC: 31.8 g/dL (ref 30.0–36.0)
MCV: 96.1 fL (ref 78.0–100.0)
PLATELETS: 276 10*3/uL (ref 150–400)
RBC: 3.57 MIL/uL — ABNORMAL LOW (ref 3.87–5.11)
RDW: 13.4 % (ref 11.5–15.5)
WBC: 11.7 10*3/uL — AB (ref 4.0–10.5)

## 2017-08-22 MED ORDER — HYDROMORPHONE HCL 2 MG PO TABS
4.0000 mg | ORAL_TABLET | ORAL | Status: DC | PRN
  Administered 2017-08-22: 4 mg via ORAL
  Filled 2017-08-22: qty 2

## 2017-08-22 MED ORDER — TIZANIDINE HCL 4 MG PO TABS: 4.0000 mg | ORAL_TABLET | Freq: Four times a day (QID) | ORAL | 1 refills | Status: DC | PRN

## 2017-08-22 MED ORDER — HYDROMORPHONE HCL 4 MG PO TABS: 4.0000 mg | ORAL_TABLET | ORAL | 0 refills | Status: DC | PRN

## 2017-08-22 MED ORDER — TIZANIDINE HCL 4 MG PO TABS
4.0000 mg | ORAL_TABLET | Freq: Four times a day (QID) | ORAL | Status: DC | PRN
  Filled 2017-08-22: qty 1

## 2017-08-22 NOTE — Progress Notes (Signed)
Patient alert and oriented, mae's well, voiding adequate amount of urine, swallowing without difficulty, c/o mild pain at time of discharge. Patient discharged home with family. Script and discharged instructions given to patient. Patient and family stated understanding of instructions given. Patient has an appointment with Dr. Jenkins 

## 2017-08-22 NOTE — Discharge Summary (Signed)
Physician Discharge Summary  Patient ID: [name] MRN: [id number] DOB/AGE: January 28, 1966 52 y.o.  Admit date: 08/21/2017 Discharge date: 08/22/2017  Admission Diagnoses: L5-S1 spondylolisthesis, foraminal stenosis, degenerative disease, L5 pars defects, lumbago, lumbar radiculopathy, neurogenic claudication  Discharge Diagnoses: The same Active Problems:   Acquired spondylolisthesis of lumbosacral region   Discharged Condition: good  Hospital Course: I performed a revision of the patient's lumbar fusion with an L5-S1 nstrumentation fusion and pelvic fixation on the patient on 08/21/2017.  The surgery went well.  The patient's postoperative course was unremarkable.  On postoperative day #1 she requested discharge to home.  The patient and her husband were given written and oral discharge instructions.  She stated she got much better pain relief with Dilaudid and requested a prescription for that at discharge.  We discussed the fact that she can overdose on Dilaudid if she takes it other than as directed and she should not take benzodiazepines with this medication.  Consults: Physical therapy Significant Diagnostic Studies: None Treatments: Exploration of lumbar fusion/removal of lumbar hardware, L5-S1 posterior lumbar interbody fusion, interbody prosthesis, posterior lateral arthrodesis L2-3, L3-4, L4-5 and L5-S1, posterior instrumentation and fusion L2-S1, pelvic fixation Discharge Exam: Blood pressure 95/60, pulse 77, temperature 98.9 F (37.2 C), temperature source Oral, resp. rate 18, height 5' 4.5" (1.638 m), weight 108 kg (238 lb 3.2 oz), SpO2 98 %. The patient is alert and pleasant.  Her strength is normal in her lower extremities.  She looks well.  Disposition: Home  Discharge Instructions    Call MD for:  difficulty breathing, headache or visual disturbances   Complete by:  As directed    Call MD for:  extreme fatigue   Complete by:  As directed    Call MD for:  hives    Complete by:  As directed    Call MD for:  persistant dizziness or light-headedness   Complete by:  As directed    Call MD for:  persistant nausea and vomiting   Complete by:  As directed    Call MD for:  redness, tenderness, or signs of infection (pain, swelling, redness, odor or green/yellow discharge around incision site)   Complete by:  As directed    Call MD for:  severe uncontrolled pain   Complete by:  As directed    Call MD for:  temperature >100.4   Complete by:  As directed    Diet - low sodium heart healthy   Complete by:  As directed    Discharge instructions   Complete by:  As directed    Call [phone] for a followup appointment. Take a stool softener while you are using pain medications.   Driving Restrictions   Complete by:  As directed    Do not drive for 2 weeks.   Increase activity slowly   Complete by:  As directed    Lifting restrictions   Complete by:  As directed    Do not lift more than 5 pounds. No excessive bending or twisting.   May shower / Bathe   Complete by:  As directed    He may shower after the pain she is removed 3 days after surgery. Leave the incision alone.   Remove dressing in 48 hours   Complete by:  As directed    Your stitches are under the scan and will dissolve by themselves. The Steri-Strips will fall off after you take a few showers. Do not rub back or pick at the wound, Leave  the wound alone.     Allergies as of 08/22/2017      Reactions   Bee Venom Anaphylaxis   Morphine And Related Other (See Comments)   Severe headache   Adhesive [tape] Itching   Some types of adhesive tape and paper tape rip up the skin.        Medication List    STOP taking these medications   diazepam 5 MG tablet Commonly known as:  VALIUM   diclofenac 50 MG tablet Commonly known as:  CATAFLAM   ibuprofen 200 MG tablet Commonly known as:  ADVIL,MOTRIN   oxyCODONE 15 MG immediate release tablet Commonly known as:  ROXICODONE     TAKE these  medications   albuterol 108 (90 Base) MCG/ACT inhaler Commonly known as:  PROVENTIL HFA;VENTOLIN HFA Inhale 2 puffs into the lungs every 6 (six) hours as needed for wheezing or shortness of breath.   amphetamine-dextroamphetamine 20 MG tablet Commonly known as:  ADDERALL Take 10 mg by mouth 2 (two) times daily.   BC HEADACHE POWDER PO Take 1 packet by mouth daily as needed (headaches).   diclofenac sodium 1 % Gel Commonly known as:  VOLTAREN Apply 2 g topically 4 (four) times daily as needed (pain).   docusate sodium 100 MG capsule Commonly known as:  COLACE Take 200 mg by mouth at bedtime.   EPINEPHrine 0.3 mg/0.3 mL Soaj injection Commonly known as:  EPI-PEN Inject 0.3 mg into the muscle daily as needed (for anaphalytic allergic reactions).   escitalopram 20 MG tablet Commonly known as:  LEXAPRO Take 20 mg by mouth at bedtime.   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 Applied to elbows.   gabapentin 300 MG capsule Commonly known as:  NEURONTIN Take 300 mg by mouth 4 (four) times daily.   HYDROmorphone 4 MG tablet Commonly known as:  DILAUDID Take 1 tablet (4 mg total) by mouth every 4 (four) hours as needed for severe pain.   hyoscyamine 0.125 MG SL tablet Commonly known as:  LEVSIN SL Place 1 tablet (0.125 mg total) under the tongue every 4 (four) hours as needed.   LUBRICANT EYE DROPS 0.4-0.3 % Soln Generic drug:  Polyethyl Glycol-Propyl Glycol Place 1-2 drops into both eyes 3 (three) times daily as needed (for dry eyes.).   MYRBETRIQ 50 MG Tb24 tablet Generic drug:  mirabegron ER Take 50 mg by mouth daily.   pantoprazole 40 MG tablet Commonly known as:  PROTONIX Take 1 tablet (40 mg total) by mouth 2 (two) times daily.   tiZANidine 4 MG tablet Commonly known as:  ZANAFLEX Take 1 tablet (4 mg total) by mouth every 6 (six) hours as needed for muscle spasms.   topiramate  50 MG tablet Commonly known as:  TOPAMAX Take 50 mg by mouth at bedtime.   varenicline 1 MG tablet Commonly known as:  CHANTIX Take 1 mg by mouth 2 (two) times daily.   Vitamin D-3 5000 units Tabs Take 5,000 Units by mouth daily.        Signed: Ophelia Charter 08/22/2017, 7:54 AM

## 2017-08-22 NOTE — Evaluation (Signed)
Physical Therapy Evaluation and Discharge Patient Details Name: [name] MRN: [id number] DOB: 07-09-1966 Today's Date: 08/22/2017   History of Present Illness  Pt is a 52 y/o female who presents s/p L5-S1 PLIF on 08/21/17.   Clinical Impression  Patient evaluated by Physical Therapy with no further acute PT needs identified. All education has been completed and the patient has no further questions. At the time of PT eval pt was able to perform transfers and ambulation with gross supervision for safety to modified independence. Pt was educated on technique for brace application, car transfer, safe activity progression, and general safety with mobility at home. See below for any follow-up Physical Therapy or equipment needs. PT is signing off. Thank you for this referral.     Follow Up Recommendations No PT follow up;Supervision for mobility/OOB    Equipment Recommendations  None recommended by PT    Recommendations for Other Services       Precautions / Restrictions Precautions Precautions: Fall;Back Precaution Booklet Issued: Yes (comment) Precaution Comments: Reviewed handout in detail with pt and husband. Pt was cued for maintenance of precautions during functional mobility.  Required Braces or Orthoses: Spinal Brace Spinal Brace: Lumbar corset;Applied in sitting position Restrictions Weight Bearing Restrictions: No      Mobility  Bed Mobility Overal bed mobility: Needs Assistance             General bed mobility comments: Pt sitting up on EOB when PT arrived.   Transfers Overall transfer level: Modified independent Equipment used: Rolling walker (2 wheeled)             General transfer comment: Slow but steady with power-up to full standing position.   Ambulation/Gait Ambulation/Gait assistance: Supervision Ambulation Distance (Feet): 300 Feet Assistive device: Rolling walker (2 wheeled) Gait Pattern/deviations: Step-through pattern;Decreased stride  length;Trunk flexed Gait velocity: Decreased Gait velocity interpretation: Below normal speed for age/gender General Gait Details: VC's for improved posture. Pt was able to ambulate fairly well with RW for support.   Stairs Stairs: Yes Stairs assistance: Min assist;Min guard Stair Management: One rail Right;Step to pattern;Forwards Number of Stairs: 5 General stair comments: HHA provided on the L as pt could not reach both rails in the stairwell to simulate home environment. With bilateral railings, feel pt could have completed with close supervision for safety.   Wheelchair Mobility    Modified Rankin (Stroke Patients Only)       Balance Overall balance assessment: Needs assistance Sitting-balance support: Feet supported;No upper extremity supported Sitting balance-Leahy Scale: Good     Standing balance support: No upper extremity supported;During functional activity Standing balance-Leahy Scale: Fair                               Pertinent Vitals/Pain Pain Assessment: Faces Faces Pain Scale: Hurts even more Pain Location: Incision site Pain Descriptors / Indicators: Operative site guarding;Discomfort Pain Intervention(s): Limited activity within patient's tolerance;Monitored during session;Repositioned    Home Living Family/patient expects to be discharged to:: Private residence Living Arrangements: Parent;Spouse/significant other Available Help at Discharge: Family;Available 24 hours/day Type of Home: House Home Access: Stairs to enter Entrance Stairs-Rails: Right;Left;Can reach both Entrance Stairs-Number of Steps: 5 Home Layout: One level Home Equipment: Walker - 2 wheels;Grab bars - toilet;Grab bars - tub/shower;Shower seat;Cane - single point      Prior Function Level of Independence: Independent with assistive device(s)         Comments: Using  the walker for support PTA     Hand Dominance        Extremity/Trunk Assessment   Upper  Extremity Assessment Upper Extremity Assessment: Overall WFL for tasks assessed    Lower Extremity Assessment Lower Extremity Assessment: Generalized weakness;LLE deficits/detail LLE Deficits / Details: Gross weakness consistent with pre-op diagnosis. Pt also reports bilateral hips have been painful and L knee is chronically painful.     Cervical / Trunk Assessment Cervical / Trunk Assessment: Other exceptions Cervical / Trunk Exceptions: s/p surgery  Communication   Communication: No difficulties  Cognition Arousal/Alertness: Awake/alert Behavior During Therapy: WFL for tasks assessed/performed Overall Cognitive Status: Within Functional Limits for tasks assessed                                        General Comments      Exercises     Assessment/Plan    PT Assessment Patent does not need any further PT services  PT Problem List         PT Treatment Interventions      PT Goals (Current goals can be found in the Care Plan section)  Acute Rehab PT Goals Patient Stated Goal: Home today PT Goal Formulation: All assessment and education complete, DC therapy    Frequency     Barriers to discharge        Co-evaluation               AM-PAC PT "6 Clicks" Daily Activity  Outcome Measure Difficulty turning over in bed (including adjusting bedclothes, sheets and blankets)?: None Difficulty moving from lying on back to sitting on the side of the bed? : A Little Difficulty sitting down on and standing up from a chair with arms (e.g., wheelchair, bedside commode, etc,.)?: A Little Help needed moving to and from a bed to chair (including a wheelchair)?: A Little Help needed walking in hospital room?: A Little Help needed climbing 3-5 steps with a railing? : A Little 6 Click Score: 19    End of Session Equipment Utilized During Treatment: Back brace Activity Tolerance: Patient tolerated treatment well Patient left: with call bell/phone within  reach;with family/visitor present(Sitting EOB) Nurse Communication: Mobility status PT Visit Diagnosis: Unsteadiness on feet (R26.81);Pain;Other symptoms and signs involving the nervous system (R29.898) Pain - part of body: (Incision site)    Time: 4081-4481 PT Time Calculation (min) (ACUTE ONLY): 30 min   Charges:   PT Evaluation $PT Eval Moderate Complexity: 1 Mod PT Treatments $Gait Training: 8-22 mins   PT G Codes:        Rolinda Roan, PT, DPT Acute Rehabilitation Services Pager: [phone]   Thelma Comp 08/22/2017, 9:56 AM

## 2017-08-27 ENCOUNTER — Encounter (HOSPITAL_COMMUNITY): Payer: Self-pay | Admitting: Neurosurgery

## 2017-08-28 MED FILL — Sodium Chloride IV Soln 0.9%: INTRAVENOUS | Qty: 1000 | Status: AC

## 2017-08-28 MED FILL — Heparin Sodium (Porcine) Inj 1000 Unit/ML: INTRAMUSCULAR | Qty: 30 | Status: AC

## 2017-09-22 ENCOUNTER — Ambulatory Visit (INDEPENDENT_AMBULATORY_CARE_PROVIDER_SITE_OTHER): Payer: Medicare Other | Admitting: Orthopedic Surgery

## 2017-09-22 ENCOUNTER — Encounter: Payer: Self-pay | Admitting: Orthopedic Surgery

## 2017-09-22 VITALS — BP 126/78 | HR 63 | Ht 64.0 in | Wt 245.0 lb

## 2017-09-22 DIAGNOSIS — M25462 Effusion, left knee: Secondary | ICD-10-CM

## 2017-09-22 DIAGNOSIS — M7551 Bursitis of right shoulder: Secondary | ICD-10-CM | POA: Diagnosis not present

## 2017-09-22 MED ORDER — DICLOFENAC SODIUM 75 MG PO TBEC: 75.0000 mg | DELAYED_RELEASE_TABLET | Freq: Two times a day (BID) | ORAL | 2 refills | Status: DC

## 2017-09-22 NOTE — Progress Notes (Signed)
Chief Complaint  Patient presents with  . Knee Pain    left   Right shoulder pain  Patient had lumbar spine fusion Dr. Arnoldo Morale.  Doing fairly well.  Does complain of right shoulder pain and here for her reevaluation left knee as she does every couple weeks or so for aspiration injection  Left knee aspiration injection today for cc of fluid  Right shoulder injection for recurrent bursitis  Procedure note injection and aspiration left knee joint  Verbal consent was obtained to aspirate and inject the left knee joint   Timeout was completed to confirm the site of aspiration and injection  An 18-gauge needle was used to aspirate the left knee joint from a suprapatellar lateral approach.  The medications used were 40 mg of Depo-Medrol and 1% lidocaine 3 cc  Anesthesia was provided by ethyl chloride and the skin was prepped with alcohol.  After cleaning the skin with alcohol an 18-gauge needle was used to aspirate the right knee joint.  We obtained 4 cc of fluid  We followed this by injection of 40 mg of Depo-Medrol and 3 cc 1% lidocaine.  There were no complications. A sterile bandage was applied.    Procedure note the subacromial injection shoulder RIGHT  Verbal consent was obtained to inject the  RIGHT   Shoulder  Timeout was completed to confirm the injection site is a subacromial space of the  RIGHT  shoulder   Medication used Depo-Medrol 40 mg and lidocaine 1% 3 cc  Anesthesia was provided by ethyl chloride  The injection was performed in the RIGHT  posterior subacromial space. After pinning the skin with alcohol and anesthetized the skin with ethyl chloride the subacromial space was injected using a 20-gauge needle. There were no complications  Sterile dressing was applied.

## 2017-09-22 NOTE — Addendum Note (Signed)
Addended by: Carole Civil on: 09/22/2017 12:18 PM   Modules accepted: Orders

## 2017-10-20 ENCOUNTER — Encounter: Payer: Self-pay | Admitting: Orthopedic Surgery

## 2017-10-20 ENCOUNTER — Ambulatory Visit (INDEPENDENT_AMBULATORY_CARE_PROVIDER_SITE_OTHER): Payer: Medicare Other | Admitting: Orthopedic Surgery

## 2017-10-20 VITALS — BP 125/76 | HR 69 | Ht 64.0 in | Wt 244.0 lb

## 2017-10-20 DIAGNOSIS — M25462 Effusion, left knee: Secondary | ICD-10-CM | POA: Diagnosis not present

## 2017-10-20 DIAGNOSIS — Z9889 Other specified postprocedural states: Secondary | ICD-10-CM

## 2017-10-20 NOTE — Progress Notes (Signed)
Progress Note   Patient ID: [name], female   DOB: 1966/07/27, 52 y.o.   MRN: [id number]  Chief Complaint  Patient presents with  . Follow-up    Recheck on left knee.    Effusion of left knee  (primary encounter diagnosis) S/p left knee arthroscopy 12/25/16  Chronic left knee effusion   With knee pain       Review of Systems  Musculoskeletal: Positive for back pain.  Neurological: Positive for tingling.   Current Meds  Medication Sig  . albuterol (PROVENTIL HFA;VENTOLIN HFA) 108 (90 Base) MCG/ACT inhaler Inhale 2 puffs into the lungs every 6 (six) hours as needed for wheezing or shortness of breath.  . amphetamine-dextroamphetamine (ADDERALL) 20 MG tablet Take 10 mg by mouth 2 (two) times daily.   . Aspirin-Salicylamide-Caffeine (BC HEADACHE POWDER PO) Take 1 packet by mouth daily as needed (headaches).  . Cholecalciferol (VITAMIN D-3) 5000 units TABS Take 5,000 Units by mouth daily.  . diclofenac (VOLTAREN) 75 MG EC tablet Take 1 tablet (75 mg total) by mouth 2 (two) times daily with a meal.  . docusate sodium (COLACE) 100 MG capsule Take 200 mg by mouth at bedtime.   Marland Kitchen escitalopram (LEXAPRO) 20 MG tablet Take 20 mg by mouth at bedtime.   . 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 Applied to elbows.  . gabapentin (NEURONTIN) 300 MG capsule Take 300 mg by mouth 4 (four) times daily.  . hyoscyamine (LEVSIN SL) 0.125 MG SL tablet Place 1 tablet (0.125 mg total) under the tongue every 4 (four) hours as needed.  . mirabegron ER (MYRBETRIQ) 50 MG TB24 tablet Take 50 mg by mouth daily.  Marland Kitchen oxyCODONE-acetaminophen (PERCOCET) 10-325 MG tablet Take 1 tablet by mouth every 4 (four) hours as needed for pain.  . pantoprazole (PROTONIX) 40 MG tablet Take 1 tablet (40 mg total) by mouth 2 (two) times daily.  Vladimir Faster Glycol-Propyl Glycol (LUBRICANT EYE DROPS) 0.4-0.3 % SOLN Place 1-2 drops into both eyes 3 (three) times daily as  needed (for dry eyes.).  Marland Kitchen tiZANidine (ZANAFLEX) 4 MG tablet Take 1 tablet (4 mg total) by mouth every 6 (six) hours as needed for muscle spasms.  Marland Kitchen topiramate (TOPAMAX) 50 MG tablet Take 50 mg by mouth at bedtime.   . varenicline (CHANTIX) 1 MG tablet Take 1 mg by mouth 2 (two) times daily.    Allergies  Allergen Reactions  . Bee Venom Anaphylaxis  . Morphine And Related Other (See Comments)    Severe headache  . Adhesive [Tape] Itching    Some types of adhesive tape and paper tape rip up the skin.       BP 125/76   Pulse 69   Ht 5\' 4"  (1.626 m)   Wt 244 lb (110.7 kg)   BMI 41.88 kg/m   Physical Exam  Constitutional: She is oriented to person, place, and time. She appears well-developed and well-nourished.  Cardiovascular:  Pulses:      Dorsalis pedis pulses are 2+ on the right side, and 2+ on the left side.       Posterior tibial pulses are 2+ on the right side, and 2+ on the left side.  Musculoskeletal:       Left knee: She exhibits decreased range of motion, swelling and effusion. She exhibits no deformity, no laceration and no erythema. Tenderness found. Medial joint line and lateral joint line tenderness noted.  Neurological: She  is alert and oriented to person, place, and time. She has normal strength. She displays no atrophy. She exhibits normal muscle tone. Gait abnormal.  Psychiatric: She has a normal mood and affect. Judgment normal.  Vitals reviewed.    Medical decision-making Encounter Diagnoses  Name Primary?  . Effusion of left knee Yes  . S/P left knee arthroscopy 12/25/16     Procedure note left knee injection verbal consent was obtained to inject left knee joint Attempted aspiration no fluid obtained  Review position needle into the anterolateral portal for injection Timeout was completed to confirm the site of injection  The medications used were 40 mg of Depo-Medrol and 1% lidocaine 3 cc  Anesthesia was provided by ethyl chloride and the skin was  prepped with alcohol.  After cleaning the skin with alcohol a 20-gauge needle was used to inject the left knee joint. There were no complications. A sterile bandage was applied.    No orders of the defined types were placed in this encounter.  Return 1 month  Arther Abbott, MD 10/20/2017 11:27 AM

## 2017-11-28 ENCOUNTER — Ambulatory Visit: Payer: Self-pay | Admitting: Orthopedic Surgery

## 2017-12-03 ENCOUNTER — Ambulatory Visit (INDEPENDENT_AMBULATORY_CARE_PROVIDER_SITE_OTHER): Payer: Medicare Other | Admitting: Orthopedic Surgery

## 2017-12-03 VITALS — BP 125/82 | HR 79 | Ht 64.0 in | Wt 244.0 lb

## 2017-12-03 DIAGNOSIS — M1712 Unilateral primary osteoarthritis, left knee: Secondary | ICD-10-CM

## 2017-12-03 DIAGNOSIS — Z9889 Other specified postprocedural states: Secondary | ICD-10-CM

## 2017-12-03 DIAGNOSIS — M7551 Bursitis of right shoulder: Secondary | ICD-10-CM

## 2017-12-03 DIAGNOSIS — M25462 Effusion, left knee: Secondary | ICD-10-CM

## 2017-12-03 MED ORDER — DICLOFENAC SODIUM 75 MG PO TBEC: 75.0000 mg | DELAYED_RELEASE_TABLET | Freq: Two times a day (BID) | ORAL | 2 refills | Status: DC

## 2017-12-03 NOTE — Progress Notes (Signed)
Follow-up visit.  Patient with a history of recurrent left knee effusions after arthroscopy history of osteoarthritis status post lumbar fusion  Complains of left knee giving out continued pain although the effusion is much better.  Patient requiring her cane much more frequently  Examination of the left knee shows no warmth or effusion she has diffuse tenderness about the joint  She agreeable to injection of the left knee which was performed in the following manner  Procedure note left knee injection verbal consent was obtained to inject left knee joint  Timeout was completed to confirm the site of injection  The medications used were 40 mg of Depo-Medrol and 1% lidocaine 3 cc  Anesthesia was provided by ethyl chloride and the skin was prepped with alcohol.  After cleaning the skin with alcohol a 20-gauge needle was used to inject the left knee joint. There were no complications. A sterile bandage was applied.   Encounter Diagnoses  Name Primary?  . S/P left knee arthroscopy 12/25/16   . Primary osteoarthritis of left knee Yes  '  Return 1 month

## 2017-12-03 NOTE — Addendum Note (Signed)
Addended by: Carole Civil on: 12/03/2017 04:46 PM   Modules accepted: Orders

## 2017-12-21 IMAGING — RF DG C-ARM 61-120 MIN
1 series · 3 of 3 positions shown · non-contrast
Comparison: None.

CLINICAL DATA: Posterior lumbar interbody fusion.

EXAM:
LUMBAR SPINE - 2-3 VIEW; DG C-ARM 61-120 MIN fluoroscopic time 1
minutes and 23 seconds.

[Series 1: run · 3 of 3 slices shown]
[im 1/3]
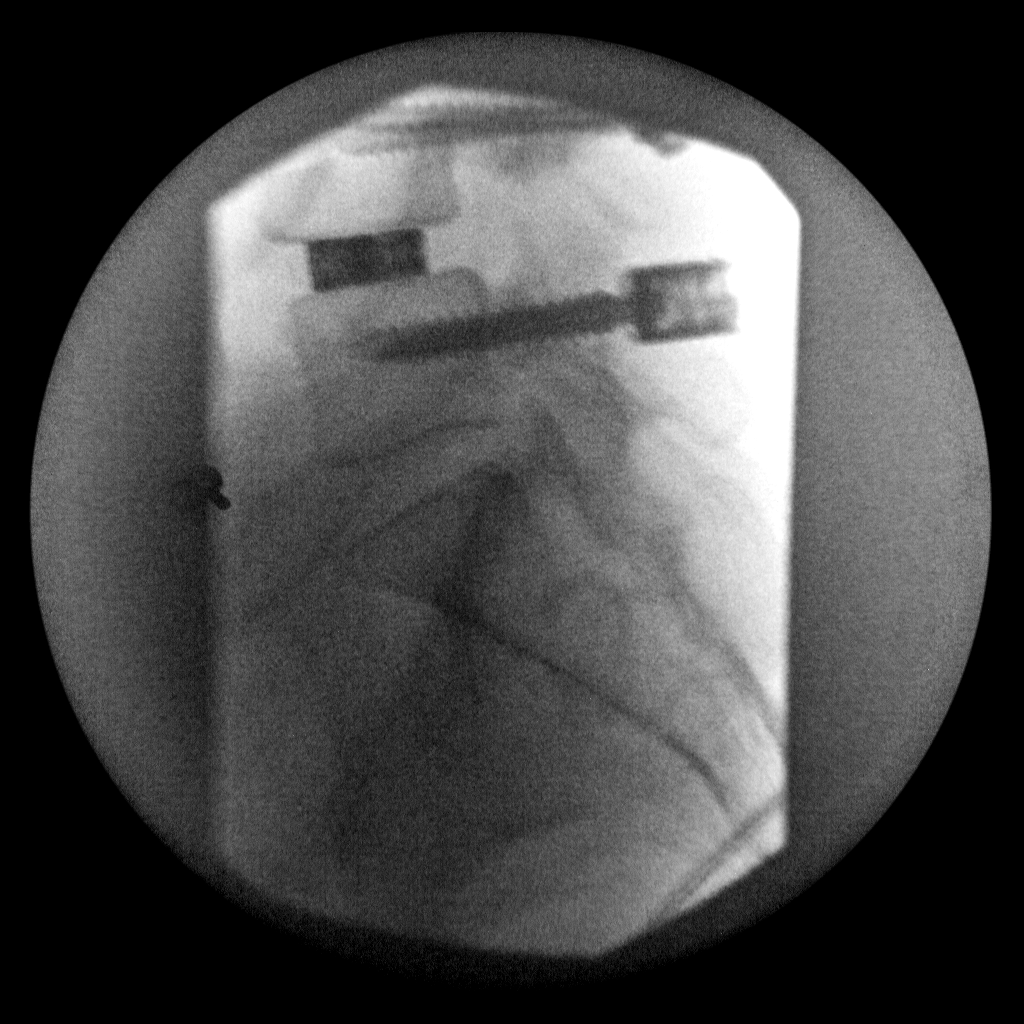
[im 2/3]
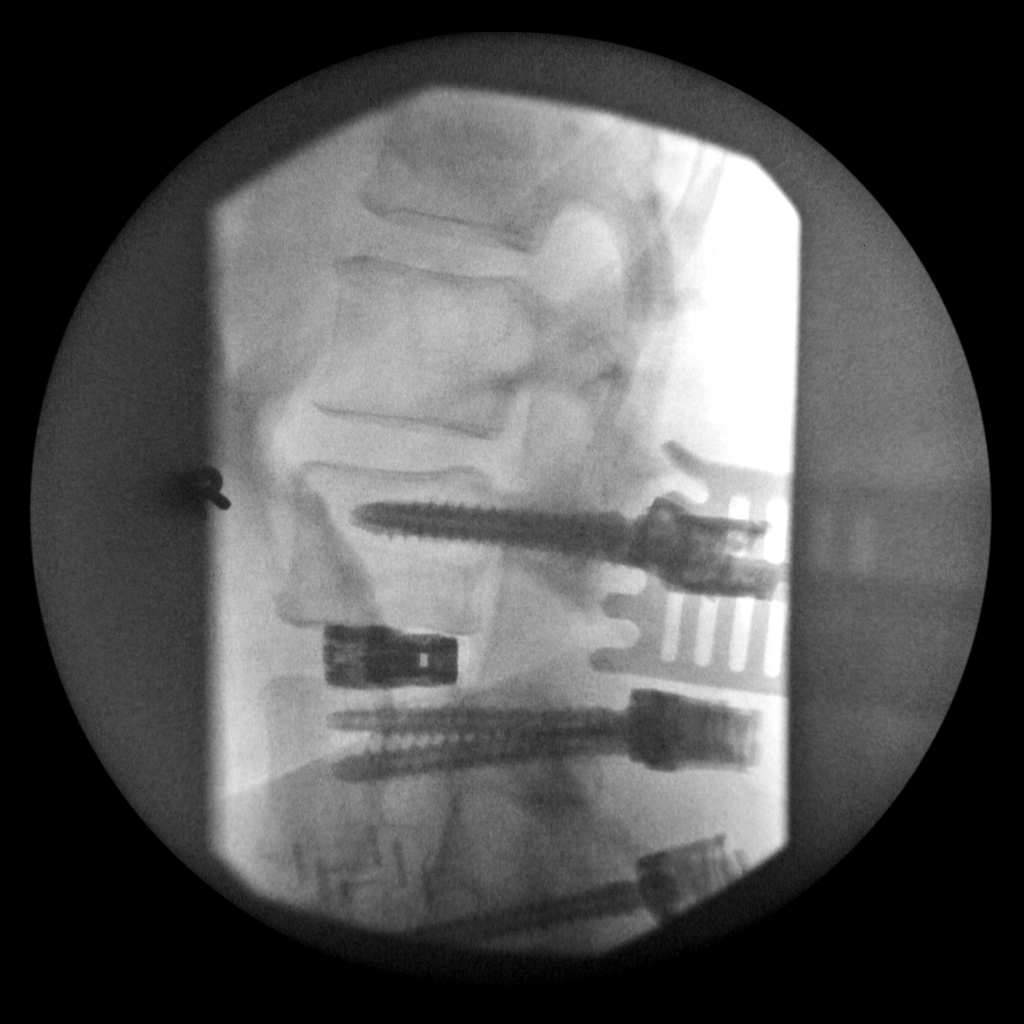
[im 3/3]
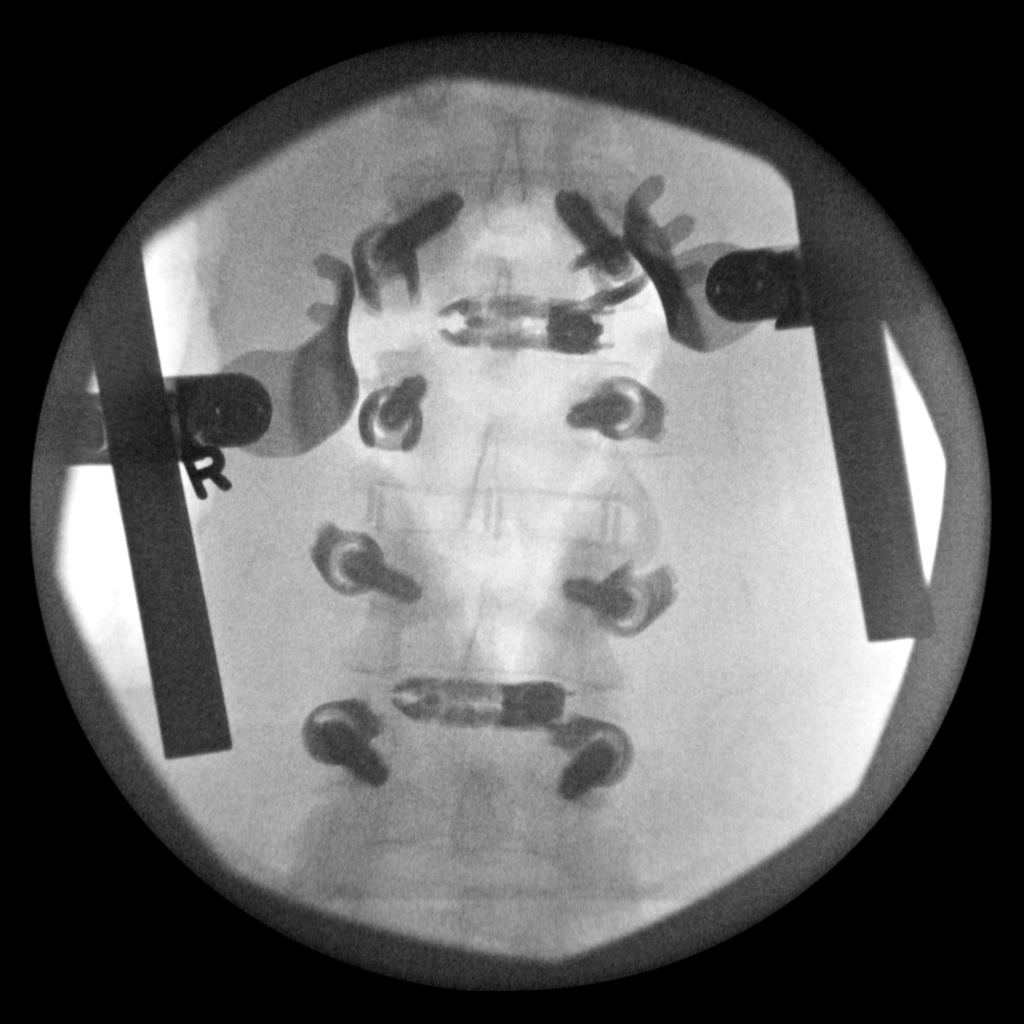

[3 of 3 positions shown; findings below may reference images not displayed]

FINDINGS: Patient status post posterior fusion of L2 through L5 with interbody
prosthesis identified. There is no malalignment.
IMPRESSION: Status post posterior fusion of L2 through L5 without malalignment.

## 2018-01-05 ENCOUNTER — Encounter: Payer: Self-pay | Admitting: Orthopedic Surgery

## 2018-01-05 ENCOUNTER — Ambulatory Visit (INDEPENDENT_AMBULATORY_CARE_PROVIDER_SITE_OTHER): Payer: Medicare Other | Admitting: Orthopedic Surgery

## 2018-01-05 VITALS — BP 124/80 | HR 69 | Ht 64.0 in | Wt 250.0 lb

## 2018-01-05 DIAGNOSIS — Z9889 Other specified postprocedural states: Secondary | ICD-10-CM | POA: Diagnosis not present

## 2018-01-05 DIAGNOSIS — M25462 Effusion, left knee: Secondary | ICD-10-CM

## 2018-01-05 DIAGNOSIS — M1712 Unilateral primary osteoarthritis, left knee: Secondary | ICD-10-CM | POA: Diagnosis not present

## 2018-01-05 NOTE — Progress Notes (Signed)
Progress Note   Patient ID: [name], female   DOB: April 01, 1966, 52 y.o.   MRN: [id number]  Chief Complaint  Patient presents with  . Knee Pain    left     Medical decision-making Encounter Diagnoses  Name Primary?  . S/P left knee arthroscopy 12/25/16 Yes  . Primary osteoarthritis of left knee   . Effusion of left knee      PLAN:  Perhaps some water aerobics  Follow-up in a month ,continue bracing   No orders of the defined types were placed in this encounter.   Chief Complaint  Patient presents with  . Knee Pain    left    52 year old female had a left knee scope she has osteoarthritis she has had recurrent effusions  She complains of giving way of her left knee.  From arthroscopy and MRI we know that her ligamentous structures are normal we cleaned out her meniscal problem so this is primarily weakness in the left leg primarily neurologic and neurogenic in nature from her chronic lumbar disc disease  She has no effusion and minimal pain in the left knee today  She wears a left knee brace    Review of Systems  Musculoskeletal: Positive for neck pain.   Current Meds  Medication Sig  . albuterol (PROVENTIL HFA;VENTOLIN HFA) 108 (90 Base) MCG/ACT inhaler Inhale 2 puffs into the lungs every 6 (six) hours as needed for wheezing or shortness of breath.  . amphetamine-dextroamphetamine (ADDERALL) 20 MG tablet Take 10 mg by mouth 2 (two) times daily.   . Aspirin-Salicylamide-Caffeine (BC HEADACHE POWDER PO) Take 1 packet by mouth daily as needed (headaches).  . Cholecalciferol (VITAMIN D-3) 5000 units TABS Take 5,000 Units by mouth daily.  . diclofenac (VOLTAREN) 75 MG EC tablet Take 1 tablet (75 mg total) by mouth 2 (two) times daily with a meal.  . docusate sodium (COLACE) 100 MG capsule Take 200 mg by mouth at bedtime.   Marland Kitchen EPINEPHrine 0.3 mg/0.3 mL IJ SOAJ injection Inject 0.3 mg into the muscle daily as needed (for anaphalytic allergic reactions).   Marland Kitchen  escitalopram (LEXAPRO) 20 MG tablet Take 20 mg by mouth at bedtime.   . 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 Applied to elbows.  . gabapentin (NEURONTIN) 300 MG capsule Take 300 mg by mouth 4 (four) times daily.  . hyoscyamine (LEVSIN SL) 0.125 MG SL tablet Place 1 tablet (0.125 mg total) under the tongue every 4 (four) hours as needed.  . methocarbamol (ROBAXIN) 750 MG tablet   . Oxycodone HCl 10 MG TABS   . oxyCODONE-acetaminophen (PERCOCET) 10-325 MG tablet Take 1 tablet by mouth every 4 (four) hours as needed for pain.  . pantoprazole (PROTONIX) 40 MG tablet Take 1 tablet (40 mg total) by mouth 2 (two) times daily.  Vladimir Faster Glycol-Propyl Glycol (LUBRICANT EYE DROPS) 0.4-0.3 % SOLN Place 1-2 drops into both eyes 3 (three) times daily as needed (for dry eyes.).  Marland Kitchen topiramate (TOPAMAX) 50 MG tablet Take 50 mg by mouth at bedtime.   . varenicline (CHANTIX) 1 MG tablet Take 1 mg by mouth 2 (two) times daily.    Allergies  Allergen Reactions  . Bee Venom Anaphylaxis  . Morphine And Related Other (See Comments)    Severe headache  . Adhesive [Tape] Itching    Some types of adhesive tape and paper tape rip up the skin.  BP 124/80   Pulse 69   Ht 5\' 4"  (1.626 m)   Wt 250 lb (113.4 kg)   BMI 42.91 kg/m   Physical Exam  Constitutional: She is oriented to person, place, and time. She appears well-developed and well-nourished.  Neurological: She is alert and oriented to person, place, and time.  Psychiatric: She has a normal mood and affect. Judgment normal.  Vitals reviewed.  No effusion left knee  Patient's gait is reasonably normal.  Knee flexion-extension is normal.  No pain or tenderness around the knee joint.  Knee is stable.  Quadricep strength to manual muscle testing is grade 5.  Skin normal  Sensation in the left leg normal.   Arther Abbott, MD 01/05/2018 11:58 AM

## 2018-02-03 ENCOUNTER — Ambulatory Visit (INDEPENDENT_AMBULATORY_CARE_PROVIDER_SITE_OTHER): Payer: Medicare Other | Admitting: Internal Medicine

## 2018-02-03 ENCOUNTER — Encounter (INDEPENDENT_AMBULATORY_CARE_PROVIDER_SITE_OTHER): Payer: Self-pay | Admitting: Internal Medicine

## 2018-02-03 VITALS — BP 140/90 | HR 56 | Temp 97.5°F | Ht 65.5 in | Wt 252.7 lb

## 2018-02-03 DIAGNOSIS — K219 Gastro-esophageal reflux disease without esophagitis: Secondary | ICD-10-CM | POA: Diagnosis not present

## 2018-02-03 DIAGNOSIS — K580 Irritable bowel syndrome with diarrhea: Secondary | ICD-10-CM

## 2018-02-03 MED ORDER — HYOSCYAMINE SULFATE 0.125 MG SL SUBL: 0.1250 mg | SUBLINGUAL_TABLET | SUBLINGUAL | 3 refills | Status: DC | PRN

## 2018-02-03 NOTE — Patient Instructions (Signed)
Continue Protonix daily. Levsin as needed for IBS

## 2018-02-03 NOTE — Progress Notes (Signed)
Subjective:    Patient ID: [name], female    DOB: 1966/06/15, 52 y.o.   MRN: [id number]  HPI Here today for f/u. Last seen in July of 2018. Hx of IBS and GERD. GERD controlled with Protonix.  She tells me she is doing good. She is having some trouble with her left knee and has a brace on it.  Followed by Dr. Aline Brochure. Has IBS-diarrhea every other week and takes Levsin as needed.  Also has IBS and takes Levsin as needed.  Usually has diarrhea at night when it occurs.  Wt in 2018  264. Today her weight is 252.7.  Her appetite is good. She has had weight loss which was intentional. Has a BM every day. She has become constipated and has manually removed from her vagina (Pressure to push stool out).  She eats more vegatables.     02/24/2015 EGD &Colonoscopy  Indications: Patient is 52 year old Caucasian female was chronic GERD and presently on double dose PPI and still having breakthrough symptoms. She is undergoing diagnostic EGD followed by screening colonoscopy. Family history is significant for colon carcinoma in paternal uncle who was in his 53s the time of diagnosis.  Impression:  EGD findings: Small sliding hiatal hernia with short tongue of salmon colored mucosa at distal esophagus. Was biopsied for routine histology. No evidence of erosive esophagitis, gastritis or peptic ulcer disease.  Colonoscopy findings: Examination performed to cecum. No evidence of colonic polyps or other abnormalities but examination somewhat suboptimal because of quality of prep. Small external hemorrhoids.       Review of Systems Past Medical History:  Diagnosis Date  . ADHD (attention deficit hyperactivity disorder)   . Anxiety   . Arthritis    disk disease  . Barrett esophagus   . Chronic pain   . COPD (chronic obstructive pulmonary disease) ([place])   .  Depression   . Dyspnea   . Fibromyalgia   . GERD (gastroesophageal reflux disease)    IBS  . Headache   . Hot flashes 04/11/2015  . IBS (irritable bowel syndrome)   . Obesity   . Peripheral arterial disease ([place])   . Plantar fasciitis of left foot   . PTSD (post-traumatic stress disorder)   . RLQ abdominal pain 04/11/2015  . 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 ANTERIOR CERVICAL DECOMP/DISCECTOMY FUSION  08/18/2012   Procedure: ANTERIOR CERVICAL DECOMPRESSION/DISCECTOMY FUSION 2 LEVELS;  Surgeon: Faythe Ghee, MD;  Location: [place] [facility];  Service: Neurosurgery;  Laterality: N/A;  anterior cervical five-six,six-seven decompression fusion plating  . ANTERIOR LAT LUMBAR FUSION  03/19/2012   Procedure: ANTERIOR LATERAL LUMBAR FUSION 1 LEVEL;  Surgeon: Faythe Ghee, MD;  Location: [place] [facility];  Service: Neurosurgery;  Laterality: Right;  Anterolateral Lumbar Interbody Fusion, Lumbar Three-Four (Hand broke through glove and nat in the room)  . BACK SURGERY    . BUNIONECTOMY Left 08/04/2014   Procedure: LEFT FOOT LAPIDUS BUNION CORRECTION, LEFT SECOND TARSALMETATARSAL JOINT DEBRIDEMENT AND LEFT MODIFIED MCBRIDE BUNIONECTOMY;  Surgeon: [name], MD;  Location: [place];  Service: Orthopedics;  Laterality: Left;  . CARPAL TUNNEL RELEASE Bilateral   . CHONDROPLASTY Left 12/25/2016   Procedure: CHONDROPLASTY LEFT PATELLA;  Surgeon: Carole Civil, MD;  Location[facility];  Service: Orthopedics;  Laterality: Left;  . CLEFT PALATE REPAIR    . COLONOSCOPY N/A 02/16/2015  Procedure: COLONOSCOPY;  Surgeon: Rogene Houston, MD;  [facility];  Service: Endoscopy;  Laterality: N/A;  155  . ENDOMETRIAL ABLATION    . ESOPHAGOGASTRODUODENOSCOPY N/A 02/16/2015   Procedure: ESOPHAGOGASTRODUODENOSCOPY (EGD);  Surgeon: Rogene Houston, MD;  [facility];  Service: Endoscopy;  Laterality:  N/A;  . KNEE ARTHROSCOPY WITH MEDIAL MENISECTOMY Left 12/25/2016   Procedure: KNEE ARTHROSCOPY WITH MEDIAL AND LATERAL MENISECTOMY;  Surgeon: Carole Civil, MD;  Location: [facility];  Service: Orthopedics;  Laterality: Left;  . LAPAROSCOPIC TUBAL LIGATION     [facility]  . MAXIMUM ACCESS (MAS)POSTERIOR LUMBAR INTERBODY FUSION (PLIF) 2 LEVEL  08/08/2016  . NOSE SURGERY    . SHOULDER OPEN ROTATOR CUFF REPAIR Right 04/02/2013   Procedure: ROTATOR CUFF REPAIR SHOULDER OPEN;  Surgeon: Carole Civil, MD;  Location[facility];  Service: Orthopedics;  Laterality: Right;  . THROAT SURGERY  2000   for throat cancer/no problems intubation...[place]  . TONSILLECTOMY    . TUBAL LIGATION      Allergies  Allergen Reactions  . Bee Venom Anaphylaxis  . Morphine And Related Other (See Comments)    Severe headache  . Adhesive [Tape] Itching    Some types of adhesive tape and paper tape rip up the skin.      Current Outpatient Medications on File Prior to Visit  Medication Sig Dispense Refill  . albuterol (PROVENTIL HFA;VENTOLIN HFA) 108 (90 Base) MCG/ACT inhaler Inhale 2 puffs into the lungs every 6 (six) hours as needed for wheezing or shortness of breath.    . amphetamine-dextroamphetamine (ADDERALL) 20 MG tablet Take 10 mg by mouth 2 (two) times daily.     . Aspirin-Salicylamide-Caffeine (BC HEADACHE POWDER PO) Take 1 packet by mouth daily as needed (headaches).    . Cholecalciferol (VITAMIN D-3) 5000 units TABS Take 5,000 Units by mouth daily.    . diclofenac (VOLTAREN) 75 MG EC tablet Take 1 tablet (75 mg total) by mouth 2 (two) times daily with a meal. 60 tablet 2  . docusate sodium (COLACE) 100 MG capsule Take 200 mg by mouth at bedtime.     Marland Kitchen EPINEPHrine 0.3 mg/0.3 mL IJ SOAJ injection Inject 0.3 mg into the muscle daily as needed (for anaphalytic allergic reactions).     Marland Kitchen escitalopram (LEXAPRO) 20 MG tablet Take 20 mg by mouth at bedtime.     . EUCRISA 2 % OINT Apply 1  application topically every other day as needed (for ezcema on elbows.). Applied to elbows.    . gabapentin (NEURONTIN) 300 MG capsule Take 300 mg by mouth 4 (four) times daily.    . methocarbamol (ROBAXIN) 750 MG tablet 2 (two) times daily.     Marland Kitchen oxyCODONE-acetaminophen (PERCOCET) 10-325 MG tablet Take 1 tablet by mouth every 4 (four) hours as needed for pain.    . pantoprazole (PROTONIX) 40 MG tablet Take 1 tablet (40 mg total) by mouth 2 (two) times daily. 60 tablet 3  . Polyethyl Glycol-Propyl Glycol (LUBRICANT EYE DROPS) 0.4-0.3 % SOLN Place 1-2 drops into both eyes 3 (three) times daily as needed (for dry eyes.).    Marland Kitchen topiramate (TOPAMAX) 50 MG tablet Take 50 mg by mouth at bedtime.     . varenicline (CHANTIX) 1 MG tablet Take 1 mg by mouth 2 (two) times daily.     No current facility-administered medications on file prior to visit.         Objective:   Physical Exam Blood pressure  140/90, pulse (!) 56, temperature (!) 97.5 F (36.4 C), height 5' 5.5" (1.664 m), weight 252 lb 11.2 oz (114.6 kg). Alert and oriented. Skin warm and dry. Oral mucosa is moist.   . Sclera anicteric, conjunctivae is pink. Thyroid not enlarged. No cervical lymphadenopathy. Lungs clear. Heart regular rate and rhythm.  Abdomen is soft. Bowel sounds are positive. No hepatomegaly. No abdominal masses felt. No tenderness.  No edema to lower extremities.           Assessment & Plan:  GERD. Continue the Protonix. IBS-diarrhea. Continue the Levsin as needed.

## 2018-02-04 ENCOUNTER — Encounter: Payer: Self-pay | Admitting: Orthopedic Surgery

## 2018-02-04 ENCOUNTER — Ambulatory Visit (INDEPENDENT_AMBULATORY_CARE_PROVIDER_SITE_OTHER): Payer: Medicare Other | Admitting: Orthopedic Surgery

## 2018-02-04 VITALS — BP 111/78 | HR 68 | Ht 64.0 in | Wt 250.0 lb

## 2018-02-04 DIAGNOSIS — Z87891 Personal history of nicotine dependence: Secondary | ICD-10-CM

## 2018-02-04 DIAGNOSIS — M1712 Unilateral primary osteoarthritis, left knee: Secondary | ICD-10-CM | POA: Diagnosis not present

## 2018-02-04 DIAGNOSIS — M25561 Pain in right knee: Secondary | ICD-10-CM | POA: Diagnosis not present

## 2018-02-04 DIAGNOSIS — M25462 Effusion, left knee: Secondary | ICD-10-CM

## 2018-02-04 DIAGNOSIS — Z6841 Body Mass Index (BMI) 40.0 and over, adult: Secondary | ICD-10-CM

## 2018-02-04 DIAGNOSIS — Z9889 Other specified postprocedural states: Secondary | ICD-10-CM

## 2018-02-04 NOTE — Patient Instructions (Addendum)
Steps to Quit Smoking Smoking tobacco can be bad for your health. It can also affect almost every organ in your body. Smoking puts you and people around you at risk for many serious long-lasting (chronic) diseases. Quitting smoking is hard, but it is one of the best things that you can do for your health. It is never too late to quit. What are the benefits of quitting smoking? When you quit smoking, you lower your risk for getting serious diseases and conditions. They can include:  Lung cancer or lung disease.  Heart disease.  Stroke.  Heart attack.  Not being able to have children (infertility).  Weak bones (osteoporosis) and broken bones (fractures).  If you have coughing, wheezing, and shortness of breath, those symptoms may get better when you quit. You may also get sick less often. If you are pregnant, quitting smoking can help to lower your chances of having a baby of low birth weight. What can I do to help me quit smoking? Talk with your doctor about what can help you quit smoking. Some things you can do (strategies) include:  Quitting smoking totally, instead of slowly cutting back how much you smoke over a period of time.  Going to in-person counseling. You are more likely to quit if you go to many counseling sessions.  Using resources and support systems, such as: ? Online chats with a counselor. ? Phone quitlines. ? Printed self-help materials. ? Support groups or group counseling. ? Text messaging programs. ? Mobile phone apps or applications.  Taking medicines. Some of these medicines may have nicotine in them. If you are pregnant or breastfeeding, do not take any medicines to quit smoking unless your doctor says it is okay. Talk with your doctor about counseling or other things that can help you.  Talk with your doctor about using more than one strategy at the same time, such as taking medicines while you are also going to in-person counseling. This can help make  quitting easier. What things can I do to make it easier to quit? Quitting smoking might feel very hard at first, but there is a lot that you can do to make it easier. Take these steps:  Talk to your family and friends. Ask them to support and encourage you.  Call phone quitlines, reach out to support groups, or work with a counselor.  Ask people who smoke to not smoke around you.  Avoid places that make you want (trigger) to smoke, such as: ? Bars. ? Parties. ? Smoke-break areas at work.  Spend time with people who do not smoke.  Lower the stress in your life. Stress can make you want to smoke. Try these things to help your stress: ? Getting regular exercise. ? Deep-breathing exercises. ? Yoga. ? Meditating. ? Doing a body scan. To do this, close your eyes, focus on one area of your body at a time from head to toe, and notice which parts of your body are tense. Try to relax the muscles in those areas.  Download or buy apps on your mobile phone or tablet that can help you stick to your quit plan. There are many free apps, such as QuitGuide from the CDC (Centers for Disease Control and Prevention). You can find more support from smokefree.gov and other websites.  This information is not intended to replace advice given to you by your health care provider. Make sure you discuss any questions you have with your health care provider. Document Released: 05/11/2009 Document   Revised: 03/12/2016 Document Reviewed: 11/29/2014 Elsevier Interactive Patient Education  2018 [employer].  Obesity, Adult Obesity is having too much body fat. If you have a BMI of 30 or more, you are obese. BMI is a number that explains how much body fat you have. Obesity is often caused by taking in (consuming) more calories than your body uses. Obesity can cause serious health problems. Changing your lifestyle can help to treat obesity. Follow these instructions at home: Eating and drinking   Follow advice from  your doctor about what to eat and drink. Your doctor may tell you to: ? Cut down on (limit) fast foods, sweets, and processed snack foods. ? Choose low-fat options. For example, choose low-fat milk instead of whole milk. ? Eat 5 or more servings of fruits or vegetables every day. ? Eat at home more often. This gives you more control over what you eat. ? Choose healthy foods when you eat out. ? Learn what a healthy portion size is. A portion size is the amount of a certain food that is healthy for you to eat at one time. This is different for each person. ? Keep low-fat snacks available. ? Avoid sugary drinks. These include soda, fruit juice, iced tea that is sweetened with sugar, and flavored milk. ? Eat a healthy breakfast.  Drink enough water to keep your pee (urine) clear or pale yellow.  Do not go without eating for long periods of time (do not fast).  Do not go on popular or trendy diets (fad diets). Physical Activity  Exercise often, as told by your doctor. Ask your doctor: ? What types of exercise are safe for you. ? How often you should exercise.  Warm up and stretch before being active.  Do slow stretching after being active (cool down).  Rest between times of being active. Lifestyle  Limit how much time you spend in front of your TV, computer, or video game system (be less sedentary).  Find ways to reward yourself that do not involve food.  Limit alcohol intake to no more than 1 drink a day for nonpregnant women and 2 drinks a day for men. One drink equals 12 oz of beer, 5 oz of wine, or 1 oz of hard liquor. General instructions  Keep a weight loss journal. This can help you keep track of: ? The food that you eat. ? The exercise that you do.  Take over-the-counter and prescription medicines only as told by your doctor.  Take vitamins and supplements only as told by your doctor.  Think about joining a support group. Your doctor may be able to help with  this.  Keep all follow-up visits as told by your doctor. This is important. Contact a doctor if:  You cannot meet your weight loss goal after you have changed your diet and lifestyle for 6 weeks. This information is not intended to replace advice given to you by your health care provider. Make sure you discuss any questions you have with your health care provider. Document Released: 10/07/2011 Document Revised: 12/21/2015 Document Reviewed: 05/03/2015 Elsevier Interactive Patient Education  2018 [employer].

## 2018-02-04 NOTE — Progress Notes (Signed)
Chief Complaint  Patient presents with  . Knee Pain    left    BP 111/78   Pulse 68   Ht 5\' 4"  (1.626 m)   Wt 250 lb (113.4 kg)   BMI 42.65 kg/m   52 year old female recurrent swelling left knee  Complains of swelling both knees pain both knees  History of peripheral edema and global body edema after taking Levaquin  Pain is worse with weightbearing  She has a large left knee effusion which decreases her ability to flex her knee the knees are warm to touch without erythema.  On the right side she has a small effusion adequate knee flexion  She is agreed to bilateral knee injections with aspiration injection left knee  Procedure note right knee injection verbal consent was obtained to inject right knee joint  Timeout was completed to confirm the site of injection  The medications used were 40 mg of Depo-Medrol and 1% lidocaine 3 cc  Anesthesia was provided by ethyl chloride and the skin was prepped with alcohol.  After cleaning the skin with alcohol a 20-gauge needle was used to inject the right knee joint. There were no complications. A sterile bandage was applied.   Procedure note injection and aspiration left knee joint  Verbal consent was obtained to aspirate and inject the left knee joint   Timeout was completed to confirm the site of aspiration and injection  An 18-gauge needle was used to aspirate the left knee joint from a suprapatellar lateral approach.  The medications used were 40 mg of Depo-Medrol and 1% lidocaine 3 cc  Anesthesia was provided by ethyl chloride and the skin was prepped with alcohol.  After cleaning the skin with alcohol an 18-gauge needle was used to aspirate the right knee joint.  We obtained 27 cc of fluid, clear yellow  We followed this by injection of 40 mg of Depo-Medrol and 3 cc 1% lidocaine.  There were no complications. A sterile bandage was applied.   Encounter Diagnoses  Name Primary?  . S/P left knee arthroscopy  12/25/16 Yes  . Primary osteoarthritis of left knee   . Effusion of left knee   . Acute pain of right knee   . Body mass index 40.0-44.9, adult ([place])   . Morbid obesity ([place])   . Smoking history     OBESITY:  COUNSELING: WORK ON WEIGHT LOSS, DIET CONTROL WITH LOW CARBS AND DRINK WATER , EXERCISE WITH LIGHT AEROBIC ACTIVITY   Smoking cessation counseling.  The patient wishes to stop she is already on Chantix it seems to be working fairly well.  She already realizes the risk and benefits of smoking versus not smoking

## 2018-03-03 ENCOUNTER — Other Ambulatory Visit (HOSPITAL_COMMUNITY): Payer: Self-pay | Admitting: Family Medicine

## 2018-03-03 DIAGNOSIS — Z1231 Encounter for screening mammogram for malignant neoplasm of breast: Secondary | ICD-10-CM

## 2018-03-10 ENCOUNTER — Ambulatory Visit (INDEPENDENT_AMBULATORY_CARE_PROVIDER_SITE_OTHER): Payer: Medicare Other | Admitting: Orthopedic Surgery

## 2018-03-10 ENCOUNTER — Encounter: Payer: Self-pay | Admitting: Orthopedic Surgery

## 2018-03-10 VITALS — BP 106/67 | HR 67 | Ht 64.0 in | Wt 248.0 lb

## 2018-03-10 DIAGNOSIS — M1712 Unilateral primary osteoarthritis, left knee: Secondary | ICD-10-CM | POA: Diagnosis not present

## 2018-03-10 NOTE — Progress Notes (Signed)
Chief Complaint  Patient presents with  . Knee Pain    left    Procedure note left knee injection verbal consent was obtained to inject left knee joint  Timeout was completed to confirm the site of injection  The medications used were 40 mg of Depo-Medrol and 1% lidocaine 3 cc  Anesthesia was provided by ethyl chloride and the skin was prepped with alcohol.  After cleaning the skin with alcohol a 20-gauge needle was used to inject the left knee joint. There were no complications. A sterile bandage was applied.  Fu 1 month

## 2018-03-11 ENCOUNTER — Ambulatory Visit (HOSPITAL_COMMUNITY)
Admission: RE | Admit: 2018-03-11 | Discharge: 2018-03-11 | Disposition: A | Discharge status: Home / Self Care | Payer: Medicare Other | Source: Ambulatory Visit | Attending: Family Medicine | Admitting: Family Medicine

## 2018-03-11 DIAGNOSIS — Z1231 Encounter for screening mammogram for malignant neoplasm of breast: Secondary | ICD-10-CM | POA: Insufficient documentation

## 2018-03-17 ENCOUNTER — Other Ambulatory Visit (HOSPITAL_COMMUNITY): Payer: Self-pay | Admitting: Family Medicine

## 2018-03-24 ENCOUNTER — Other Ambulatory Visit (HOSPITAL_COMMUNITY): Payer: Self-pay | Admitting: Family Medicine

## 2018-03-24 DIAGNOSIS — N644 Mastodynia: Secondary | ICD-10-CM

## 2018-03-31 ENCOUNTER — Encounter (HOSPITAL_COMMUNITY): Payer: Medicare Other

## 2018-04-07 ENCOUNTER — Ambulatory Visit (HOSPITAL_COMMUNITY): Payer: Medicare Other

## 2018-04-07 ENCOUNTER — Other Ambulatory Visit: Payer: Self-pay | Admitting: Orthopedic Surgery

## 2018-04-07 ENCOUNTER — Ambulatory Visit (HOSPITAL_COMMUNITY)
Admission: RE | Admit: 2018-04-07 | Discharge: 2018-04-07 | Disposition: A | Discharge status: Home / Self Care | Payer: Medicare Other | Source: Ambulatory Visit | Attending: Family Medicine | Admitting: Family Medicine

## 2018-04-07 DIAGNOSIS — N644 Mastodynia: Secondary | ICD-10-CM | POA: Diagnosis not present

## 2018-04-08 ENCOUNTER — Ambulatory Visit (INDEPENDENT_AMBULATORY_CARE_PROVIDER_SITE_OTHER): Payer: Medicare Other | Admitting: Orthopedic Surgery

## 2018-04-08 VITALS — BP 131/76 | HR 56 | Ht 64.0 in | Wt 256.0 lb

## 2018-04-08 DIAGNOSIS — M1712 Unilateral primary osteoarthritis, left knee: Secondary | ICD-10-CM

## 2018-04-08 DIAGNOSIS — M7551 Bursitis of right shoulder: Secondary | ICD-10-CM | POA: Diagnosis not present

## 2018-04-08 DIAGNOSIS — M25462 Effusion, left knee: Secondary | ICD-10-CM | POA: Diagnosis not present

## 2018-04-08 MED ORDER — DICLOFENAC SODIUM 75 MG PO TBEC: 75.0000 mg | DELAYED_RELEASE_TABLET | Freq: Two times a day (BID) | ORAL | 2 refills | Status: DC

## 2018-04-08 NOTE — Progress Notes (Signed)
Progress Note   Patient ID: [name], female   DOB: 01-21-1966, 52 y.o.   MRN: [id number]   Chief Complaint  Patient presents with  . Follow-up    Recheck on left knee.    52 years old follow-up left knee worsening pain increasing swelling.  Patient considering nerve blocks for left knee    Review of Systems  Constitutional: Negative for chills and fever.  Musculoskeletal: Positive for back pain.  Skin: Negative.      Allergies  Allergen Reactions  . Bee Venom Anaphylaxis  . Morphine And Related Other (See Comments)    Severe headache  . Adhesive [Tape] Itching    Some types of adhesive tape and paper tape rip up the skin.       BP 131/76   Pulse (!) 56   Ht 5\' 4"  (1.626 m)   Wt 256 lb (116.1 kg)   BMI 43.94 kg/m   Physical Exam  Constitutional: She is oriented to person, place, and time. She appears well-developed and well-nourished.  Musculoskeletal:       Left knee: She exhibits decreased range of motion, swelling and effusion. She exhibits no ecchymosis, no deformity, no LCL laxity and no MCL laxity. Tenderness found. Lateral joint line tenderness noted.  Neurological: She is alert and oriented to person, place, and time. She has normal strength.  Psychiatric: She has a normal mood and affect. Judgment normal.  Vitals reviewed.    Medical decisions:   Data  Imaging:   No new imaging  Encounter Diagnoses  Name Primary?  . Primary osteoarthritis of left knee   . Effusion of left knee Yes  . Bursitis of right shoulder     PLAN:   Patient presents today with swelling and effusion left knee.  Aspiration injection performed  Follow-up 1 month  Procedure note injection and aspiration left knee joint  Verbal consent was obtained to aspirate and inject the left knee joint   Timeout was completed to confirm the site of aspiration and injection  An 18-gauge needle was used to aspirate the left knee joint from a suprapatellar lateral  approach.  The medications used were 40 mg of Depo-Medrol and 1% lidocaine 3 cc  Anesthesia was provided by ethyl chloride and the skin was prepped with alcohol.  After cleaning the skin with alcohol an 18-gauge needle was used to aspirate the right knee joint.  We obtained 28 cc of fluid  We followed this by injection of 40 mg of Depo-Medrol and 3 cc 1% lidocaine.  There were no complications. A sterile bandage was applied.   FU 1 MONTH   Arther Abbott, MD 04/08/2018 11:26 AM

## 2018-05-06 ENCOUNTER — Ambulatory Visit (INDEPENDENT_AMBULATORY_CARE_PROVIDER_SITE_OTHER): Payer: BLUE CROSS/BLUE SHIELD | Admitting: Orthopedic Surgery

## 2018-05-06 ENCOUNTER — Encounter: Payer: Self-pay | Admitting: Orthopedic Surgery

## 2018-05-06 VITALS — BP 102/63 | HR 55 | Ht 64.5 in | Wt 259.0 lb

## 2018-05-06 DIAGNOSIS — M25462 Effusion, left knee: Secondary | ICD-10-CM | POA: Diagnosis not present

## 2018-05-06 DIAGNOSIS — Z6841 Body Mass Index (BMI) 40.0 and over, adult: Secondary | ICD-10-CM

## 2018-05-06 NOTE — Progress Notes (Addendum)
Chief Complaint  Patient presents with  . Knee Pain    Left knee pain and swelling. DOS 12/25/16    Procedure note injection and aspiration left knee joint  Verbal consent was obtained to aspirate and inject the left knee joint   Timeout was completed to confirm the site of aspiration and injection  An 18-gauge needle was used to aspirate the left knee joint from a suprapatellar lateral approach.  The medications used were 40 mg of Depo-Medrol and 1% lidocaine 3 cc  Anesthesia was provided by ethyl chloride and the skin was prepped with alcohol.  After cleaning the skin with alcohol an 18-gauge needle was used to aspirate the right knee joint.  We obtained 14 cc of fluid  We followed this by injection of 40 mg of Depo-Medrol and 3 cc 1% lidocaine.  There were no complications. A sterile bandage was applied.   Encounter Diagnosis  Name Primary?  . Effusion of left knee Yes  The patient meets the AMA guidelines for Morbid (severe) obesity with a BMI > 40.0 and I have recommended weight loss.  Fu 1 month

## 2018-05-13 DIAGNOSIS — M542 Cervicalgia: Secondary | ICD-10-CM | POA: Diagnosis not present

## 2018-05-13 DIAGNOSIS — Z6841 Body Mass Index (BMI) 40.0 and over, adult: Secondary | ICD-10-CM | POA: Diagnosis not present

## 2018-05-13 DIAGNOSIS — M545 Low back pain: Secondary | ICD-10-CM | POA: Diagnosis not present

## 2018-05-13 DIAGNOSIS — R030 Elevated blood-pressure reading, without diagnosis of hypertension: Secondary | ICD-10-CM | POA: Diagnosis not present

## 2018-06-08 ENCOUNTER — Encounter: Payer: Self-pay | Admitting: Orthopedic Surgery

## 2018-06-08 ENCOUNTER — Ambulatory Visit (INDEPENDENT_AMBULATORY_CARE_PROVIDER_SITE_OTHER): Payer: BLUE CROSS/BLUE SHIELD | Admitting: Orthopedic Surgery

## 2018-06-08 VITALS — BP 141/63 | HR 80 | Ht 64.0 in | Wt 263.0 lb

## 2018-06-08 DIAGNOSIS — G8929 Other chronic pain: Secondary | ICD-10-CM

## 2018-06-08 DIAGNOSIS — M25562 Pain in left knee: Secondary | ICD-10-CM

## 2018-06-08 DIAGNOSIS — M1712 Unilateral primary osteoarthritis, left knee: Secondary | ICD-10-CM

## 2018-06-08 NOTE — Progress Notes (Signed)
Chief Complaint  Patient presents with  . Knee Pain    left     Procedure note left knee injection verbal consent was obtained to inject left knee joint  Timeout was completed to confirm the site of injection  The medications used were 40 mg of Depo-Medrol and 1% lidocaine 3 cc  Anesthesia was provided by ethyl chloride and the skin was prepped with alcohol.  After cleaning the skin with alcohol a 20-gauge needle was used to inject the left knee joint. There were no complications. A sterile bandage was applied.  I also attempted to aspirate but was unsuccessful in gaining any fluid from the left knee  She wanted to try a left medial OA brace which was attempted

## 2018-06-30 DIAGNOSIS — Z6841 Body Mass Index (BMI) 40.0 and over, adult: Secondary | ICD-10-CM | POA: Diagnosis not present

## 2018-06-30 DIAGNOSIS — M545 Low back pain: Secondary | ICD-10-CM | POA: Diagnosis not present

## 2018-06-30 DIAGNOSIS — M542 Cervicalgia: Secondary | ICD-10-CM | POA: Diagnosis not present

## 2018-06-30 DIAGNOSIS — R030 Elevated blood-pressure reading, without diagnosis of hypertension: Secondary | ICD-10-CM | POA: Diagnosis not present

## 2018-07-08 ENCOUNTER — Encounter: Payer: Self-pay | Admitting: Orthopedic Surgery

## 2018-07-08 ENCOUNTER — Ambulatory Visit (INDEPENDENT_AMBULATORY_CARE_PROVIDER_SITE_OTHER): Payer: BLUE CROSS/BLUE SHIELD | Admitting: Orthopedic Surgery

## 2018-07-08 VITALS — BP 177/82 | HR 75 | Ht 64.0 in | Wt 262.0 lb

## 2018-07-08 DIAGNOSIS — M25462 Effusion, left knee: Secondary | ICD-10-CM

## 2018-07-08 DIAGNOSIS — M25562 Pain in left knee: Secondary | ICD-10-CM | POA: Diagnosis not present

## 2018-07-08 DIAGNOSIS — G8929 Other chronic pain: Secondary | ICD-10-CM

## 2018-07-08 DIAGNOSIS — Z6841 Body Mass Index (BMI) 40.0 and over, adult: Secondary | ICD-10-CM

## 2018-07-08 DIAGNOSIS — M1712 Unilateral primary osteoarthritis, left knee: Secondary | ICD-10-CM

## 2018-07-08 NOTE — Progress Notes (Signed)
Chief Complaint  Patient presents with  . Knee Pain    Increasing pain left knee thinks it may be swollen   52 year old female chronic pain has had a couple of lumbar surgeries in the recent year or so had arthroscopy and meniscectomy of her left knee we found she had some grade 2 and 3 chondral changes in the trochlea patellofemoral region with normal articular cartilage around the EDL and lateral joint  Has recurrent effusions which we have treated with aspiration injection on a monthly basis.  She says she feels like her knee is getting worse in terms of pain and swelling and inability to bend the knee.  She comes back in for reevaluation and treatment   Mri before surgery  1. Maceration of the anterior horn of the lateral meniscus with a 7 mm parameniscal cyst adjacent to the root. Increased signal and expansion of the anterior horn- body junction of the medial meniscus likely reflecting a small intrameniscal cyst. 2. Oblique tear of the posterior horn of the medial meniscus extending to the inferior articular surface. 3. Tricompartmental cartilage abnormalities as described above.  OP REPORT:     PRE-OPERATIVE DIAGNOSIS:  LEFT KNEE MEDIAL AND LATERAL MENISCUS TEARS   POST-OPERATIVE DIAGNOSIS:  LEFT KNEE MEDIAL AND LATERAL MENISCUS TEARS, CHONDROMALACIA LEFT PATELLA, LATERAL FACET    PROCEDURE:  Procedure(s): KNEE ARTHROSCOPY WITH MEDIAL AND LATERAL MENISECTOMY (Left) CHONDROPLASTY LEFT PATELLA (Left)   Operative findings were normal medial articular cartilage, fraying medial meniscus, lateral meniscus anterior horn tear with portion of the body torn as well. Patellofemoral trochlea cartilage lateral facet grade 2/3 chondromalacia normal anterior cruciate ligament PCL  System review she is still having the chronic back pain she is currently on Voltaren 75 mg twice daily  She is trying to stop smoking she is on Chantix  Her BMI is over 40 and we talked to her about that once  again  The patient meets the AMA guidelines for Morbid (severe) obesity with a BMI > 40.0 and I have recommended weight loss.   BP (!) 177/82   Pulse 75   Ht 5\' 4"  (1.626 m)   Wt 262 lb (118.8 kg)   BMI 44.97 kg/m    General appearance well groomed.  Awake alert and oriented x3.  Mood and affect normal mood and pleasant affect  She comes in today walking with her cane her left knee is swollen she gets it fully straight but cannot bend it past 90 degrees she is tender diffusely about the knee including the suprapatellar pouch.  The medial lateral joint lines are mildly tender as well.  The knee feels stable in the coronal and sagittal plane.  She has good quadriceps strength against resistance.  No real peripheral edema.   Again I looked at her MRI before surgery looked at her operative report it is unclear why she continues to have recurrent effusions may be sympathetic effusion from the neurologic dysfunction in her left leg  Recommend aspiration injection and x-ray at next visit  Procedure note injection and aspiration left knee joint  Verbal consent was obtained to aspirate and inject the left knee joint   Timeout was completed to confirm the site of aspiration and injection  An 18-gauge needle was used to aspirate the left knee joint from a suprapatellar lateral approach.  The medications used were 40 mg of Depo-Medrol and 1% lidocaine 3 cc  Anesthesia was provided by ethyl chloride and the skin was prepped with alcohol.  After cleaning the skin with alcohol an 18-gauge needle was used to aspirate the right knee joint.  We obtained 21 cc of fluid, clear fluid yellow with cartilage flecks noted in the fluid  We followed this by injection of 40 mg of Depo-Medrol and 3 cc 1% lidocaine.  There were no complications. A sterile bandage was applied.   Encounter Diagnoses  Name Primary?  . Primary osteoarthritis of left knee Yes  . Chronic pain of left knee   . Effusion of  left knee   . Body mass index 40.0-44.9, adult ([place])   . Morbid obesity ([place])

## 2018-07-08 NOTE — Patient Instructions (Signed)

## 2018-08-05 ENCOUNTER — Ambulatory Visit (INDEPENDENT_AMBULATORY_CARE_PROVIDER_SITE_OTHER): Payer: BLUE CROSS/BLUE SHIELD | Admitting: Orthopedic Surgery

## 2018-08-05 ENCOUNTER — Encounter: Payer: Self-pay | Admitting: Orthopedic Surgery

## 2018-08-05 ENCOUNTER — Ambulatory Visit (INDEPENDENT_AMBULATORY_CARE_PROVIDER_SITE_OTHER): Payer: Medicare Other

## 2018-08-05 VITALS — BP 150/70 | HR 75 | Ht 64.0 in | Wt 262.0 lb

## 2018-08-05 DIAGNOSIS — M1710 Unilateral primary osteoarthritis, unspecified knee: Secondary | ICD-10-CM

## 2018-08-05 DIAGNOSIS — Z6841 Body Mass Index (BMI) 40.0 and over, adult: Secondary | ICD-10-CM

## 2018-08-05 DIAGNOSIS — M1712 Unilateral primary osteoarthritis, left knee: Secondary | ICD-10-CM

## 2018-08-05 DIAGNOSIS — M25462 Effusion, left knee: Secondary | ICD-10-CM

## 2018-08-05 MED ORDER — DICLOFENAC SODIUM 75 MG PO TBEC: 75.0000 mg | DELAYED_RELEASE_TABLET | Freq: Two times a day (BID) | ORAL | 2 refills | Status: DC

## 2018-08-05 NOTE — Progress Notes (Addendum)
Progress Note   Patient ID: [name], female   DOB: 1966/01/18, 53 y.o.   MRN: [id number]   Chief Complaint  Patient presents with  . Knee Pain    left    53 YO feMALE WITH WORSENING LEFT KNEE PAIN AND SWELLING   History of multiple back interventions including injections and surgery.  She is on 10 mg Percocet she is on tizanidine 4mg  gabapentin 300 mg.  Has chronic pain from those back issues she has left knee arthritis with recurrent effusion.  She has had an arthroscopy in the past.  Comes in today with increasing pain and swelling    Review of Systems  Musculoskeletal: Positive for back pain and joint pain.  Neurological: Negative for tingling.     Allergies  Allergen Reactions  . Bee Venom Anaphylaxis  . Morphine And Related Other (See Comments)    Severe headache  . Adhesive [Tape] Itching    Some types of adhesive tape and paper tape rip up the skin.       BP (!) 150/70   Pulse 75   Ht 5\' 4"  (1.626 m)   Wt 262 lb (118.8 kg)   BMI 44.97 kg/m   Physical Exam Vitals signs reviewed.  Constitutional:      Appearance: She is well-developed.  Musculoskeletal:       Legs:  Neurological:     Mental Status: She is alert and oriented to person, place, and time.     Motor: Motor function is intact. No atrophy or abnormal muscle tone.     Gait: Gait abnormal.     Comments: Patient is ambulatory with a cane in the right hand with a left lower extremity limp  Psychiatric:        Attention and Perception: Attention normal.        Mood and Affect: Mood and affect normal.        Speech: Speech normal.        Behavior: Behavior normal.        Thought Content: Thought content normal.        Judgment: Judgment normal.      Medical decisions:   Data  Imaging:   Images today AP lateral and sunrise view of the left knee compared to x-rays taken in 2015 shows progressive loss of joint space in lateral compartment and increasing valgus deformity.  She has mild  secondary bone changes of sclerosis in the subchondral bone without significant osteophyte formation  Encounter Diagnoses  Name Primary?  Marland Kitchen Arthritis of knee Yes  . Effusion of left knee   . Body mass index 40.0-44.9, adult ([place])   . Morbid obesity ([facility])     PLAN:   Aspiration injection left knee  Procedure note injection and aspiration left knee joint  Verbal consent was obtained to aspirate and inject the left knee joint   Timeout was completed to confirm the site of aspiration and injection  An 18-gauge needle was used to aspirate the left knee joint from a suprapatellar lateral approach.  The medications used were 40 mg of Depo-Medrol and 1% lidocaine 3 cc  Anesthesia was provided by ethyl chloride and the skin was prepped with alcohol.  After cleaning the skin with alcohol an 18-gauge needle was used to aspirate the right knee joint.  We obtained 25 cc of fluid  We followed this by injection of 40 mg of Depo-Medrol and 3 cc 1% lidocaine.  There were no complications. A sterile bandage  was applied.   Patient advised to start weight loss interventions to correct her obesity as she will need knee replacement surgery.  I told her that she will have difficulties as patients with back problems seem to have the worst postoperative course regarding pain control and therefore therapy on the knee however, her knee is progressively worsening and she is maxed out on pain medication.  She will follow-up in a month  The patient meets the AMA guidelines for Morbid (severe) obesity with a BMI > 40.0 and I have recommended weight loss.  Meds ordered this encounter  Medications  . diclofenac (VOLTAREN) 75 MG EC tablet    Sig: Take 1 tablet (75 mg total) by mouth 2 (two) times daily with a meal.    Dispense:  60 tablet    Refill:  2      Arther Abbott, MD 08/05/2018 2:49 PM

## 2018-08-10 ENCOUNTER — Ambulatory Visit (INDEPENDENT_AMBULATORY_CARE_PROVIDER_SITE_OTHER): Payer: BLUE CROSS/BLUE SHIELD | Admitting: Adult Health

## 2018-08-10 ENCOUNTER — Encounter: Payer: Self-pay | Admitting: Adult Health

## 2018-08-10 ENCOUNTER — Other Ambulatory Visit (HOSPITAL_COMMUNITY)
Admission: RE | Admit: 2018-08-10 | Discharge: 2018-08-10 | Disposition: A | Discharge status: Home / Self Care | Payer: BLUE CROSS/BLUE SHIELD | Source: Ambulatory Visit | Attending: Adult Health | Admitting: Adult Health

## 2018-08-10 VITALS — BP 141/84 | HR 66 | Ht 63.5 in | Wt 260.0 lb

## 2018-08-10 DIAGNOSIS — Z124 Encounter for screening for malignant neoplasm of cervix: Secondary | ICD-10-CM | POA: Diagnosis not present

## 2018-08-10 DIAGNOSIS — Z01419 Encounter for gynecological examination (general) (routine) without abnormal findings: Secondary | ICD-10-CM | POA: Insufficient documentation

## 2018-08-10 DIAGNOSIS — F32A Depression, unspecified: Secondary | ICD-10-CM | POA: Insufficient documentation

## 2018-08-10 DIAGNOSIS — R1031 Right lower quadrant pain: Secondary | ICD-10-CM

## 2018-08-10 DIAGNOSIS — F329 Major depressive disorder, single episode, unspecified: Secondary | ICD-10-CM

## 2018-08-10 DIAGNOSIS — Z1212 Encounter for screening for malignant neoplasm of rectum: Secondary | ICD-10-CM | POA: Diagnosis not present

## 2018-08-10 DIAGNOSIS — L853 Xerosis cutis: Secondary | ICD-10-CM | POA: Diagnosis not present

## 2018-08-10 DIAGNOSIS — Z113 Encounter for screening for infections with a predominantly sexual mode of transmission: Secondary | ICD-10-CM

## 2018-08-10 DIAGNOSIS — R6882 Decreased libido: Secondary | ICD-10-CM | POA: Insufficient documentation

## 2018-08-10 DIAGNOSIS — Z1211 Encounter for screening for malignant neoplasm of colon: Secondary | ICD-10-CM

## 2018-08-10 DIAGNOSIS — R232 Flushing: Secondary | ICD-10-CM | POA: Diagnosis not present

## 2018-08-10 LAB — HEMOCCULT GUIAC POC 1CARD (OFFICE): Fecal Occult Blood, POC: NEGATIVE

## 2018-08-10 NOTE — Progress Notes (Signed)
Patient ID: [name], female   DOB: 1965-12-02, 53 y.o.   MRN: [id number] History of Present Illness: Alaiah is a 53 year old white female, married, in for a well woman gyn exam and pap.She is complaining of hot flashes, decreased libido, facial hair and dry skin.  PCP is Dr Lorriane Shire.    Current Medications, Allergies, Past Medical History, Past Surgical History, Family History and Social History were reviewed in [employer] record.     Review of Systems: Patient denies hearing loss, fatigue, blurred vision, shortness of breath, chest pain, abdominal pain, problems with bowel movements(chronic issues), urination, or intercourse. Has headaches almost daily, has joint pains and is depressed, long standing. Lives with her mom, who is negative +hot flashes Decreased libido Get bump right labia at times  Dry skin   Physical Exam:BP (!) 141/84 (BP Location: Left Arm, Patient Position: Sitting, Cuff Size: Large)   Pulse 66   Ht 5' 3.5" (1.613 m)   Wt 260 lb (117.9 kg)   BMI 45.33 kg/m  General:  Well developed, well nourished, no acute distress Skin:  Warm and dry Neck:  Midline trachea, normal thyroid, good ROM, no lymphadenopathy Lungs; Clear to auscultation bilaterally Breast:  No dominant palpable mass, retraction, or nipple discharge Cardiovascular: Regular rate and rhythm Abdomen:  Soft, non tender, no hepatosplenomegaly Pelvic:  External genitalia is normal in appearance, no lesions.  The vagina is normal in appearance. Urethra has no lesions or masses. The cervix is smooth. Pap with HPV and GC/CHl performed.   Uterus is felt to be normal size, shape, and contour.  No adnexal masses, RLQ tenderness noted.Bladder is non tender, no masses felt. Rectal: Good sphincter tone, no polyps, or hemorrhoids felt.  Hemoccult negative. Extremities/musculoskeletal:  No swelling or varicosities noted, no clubbing or cyanosis, Psych:  No mood changes, alert and  cooperative,seems happy Fall risk is low. PHQ 9 is 23, is on lexparo and is going to see somebody in [place] soon, denies being suicidal.  Discussed stopping chantix, if only smoking 1-2 per days now, as it can make you feel down.  Will check labs and get [place]. Examination chaperoned by Levy Pupa LPN  Impression: 1. Encounter for gynecological examination with Papanicolaou smear of cervix   2. Routine cervical smear   3. Screening for colorectal cancer   4. Hot flashes   5. Depression, unspecified depression type   6. RLQ abdominal pain   7. Decreased libido without sexual dysfunction   8. Dry skin   9. Screening examination for STD (sexually transmitted disease)       Plan: Check HIV, RPR, TSH and FSH Return in 1 week for gyn [place] See me in about 2 weeks Physical in 1 year  Pap in 3 if normal Mammogram yearly Colonoscopy per GI Labs with PCP

## 2018-08-11 LAB — RPR: RPR Ser Ql: NONREACTIVE

## 2018-08-11 LAB — FOLLICLE STIMULATING HORMONE: FSH: 41.9 m[IU]/mL

## 2018-08-11 LAB — HIV ANTIBODY (ROUTINE TESTING W REFLEX): HIV Screen 4th Generation wRfx: NONREACTIVE

## 2018-08-11 LAB — TSH: TSH: 1.13 u[IU]/mL (ref 0.450–4.500)

## 2018-08-12 LAB — CYTOLOGY - PAP
ADEQUACY: ABSENT
Chlamydia: NEGATIVE
Diagnosis: NEGATIVE
HPV: NOT DETECTED
NEISSERIA GONORRHEA: NEGATIVE

## 2018-08-17 ENCOUNTER — Encounter: Payer: Medicare Other | Admitting: Psychology

## 2018-08-18 ENCOUNTER — Ambulatory Visit (INDEPENDENT_AMBULATORY_CARE_PROVIDER_SITE_OTHER): Payer: BLUE CROSS/BLUE SHIELD

## 2018-08-18 ENCOUNTER — Other Ambulatory Visit: Payer: Medicare Other

## 2018-08-18 DIAGNOSIS — R1031 Right lower quadrant pain: Secondary | ICD-10-CM

## 2018-08-18 NOTE — Progress Notes (Signed)
PELVIC US TA/TV: homogeneous anteverted uterus,wnl,mult small simple nabothian cyst,EEC 1.5 mm,normal ovaries bilat,no free fluid,no pain during ultrasound,ovaries appear mobile

## 2018-08-21 ENCOUNTER — Telehealth: Payer: Self-pay | Admitting: Adult Health

## 2018-08-21 NOTE — Telephone Encounter (Signed)
Pt aware that US showed normal uterus and ovaries and EEC 1.5 mm has appt next week

## 2018-08-24 ENCOUNTER — Encounter: Payer: Self-pay | Admitting: Adult Health

## 2018-08-24 ENCOUNTER — Ambulatory Visit (INDEPENDENT_AMBULATORY_CARE_PROVIDER_SITE_OTHER): Payer: BLUE CROSS/BLUE SHIELD | Admitting: Adult Health

## 2018-08-24 ENCOUNTER — Other Ambulatory Visit: Payer: Self-pay

## 2018-08-24 VITALS — BP 138/74 | HR 63 | Ht 63.5 in | Wt 259.0 lb

## 2018-08-24 DIAGNOSIS — R6882 Decreased libido: Secondary | ICD-10-CM | POA: Diagnosis not present

## 2018-08-24 DIAGNOSIS — R232 Flushing: Secondary | ICD-10-CM

## 2018-08-24 DIAGNOSIS — Z79890 Hormone replacement therapy (postmenopausal): Secondary | ICD-10-CM | POA: Diagnosis not present

## 2018-08-24 DIAGNOSIS — L853 Xerosis cutis: Secondary | ICD-10-CM

## 2018-08-24 DIAGNOSIS — Z780 Asymptomatic menopausal state: Secondary | ICD-10-CM

## 2018-08-24 MED ORDER — ESTRADIOL-LEVONORGESTREL 0.045-0.015 MG/DAY TD PTWK: 1.0000 | MEDICATED_PATCH | TRANSDERMAL | 12 refills | Status: DC

## 2018-08-24 NOTE — Progress Notes (Signed)
Patient ID: [name], female   DOB: [birth year], 53 y.o.   MRN: [id number] History of Present Illness: [name] is a 53 year old white female, back in follow up on labs and [place] and discuss HRT. PCP is Dr Cindie Laroche.    Current Medications, Allergies, Past Medical History, Past Surgical History, Family History and Social History were reviewed in [employer] record.     Review of Systems: +hot flashes +dry skin +decreased libido +not sleeping well     Physical Exam:BP 138/74 (BP Location: Right Arm, Patient Position: Sitting, Cuff Size: Large)   Pulse 63   Ht 5' 3.5" (1.613 m)   Wt 259 lb (117.5 kg)   BMI 45.16 kg/m  General:  Well developed, well nourished, no acute distress Skin:  Warm and dry Psych:  No mood changes, alert and cooperative,seems happy Pt aware HIV and RPR negative, TSH 1.130 FSH 41.9 and US showed normal uterus and ovaries, EEC 1.5 mm, no pain on [place], and ovaries moved freely.  Discussed pros and cons of HRT and she wants to try.Stop black cohosh and DHEA.    Impression: 1. Menopause   2. Hormone replacement therapy (HRT)   3. Hot flashes   4. Decreased libido without sexual dysfunction   5. Dry skin       Plan:  Meds ordered this encounter  Medications  . estradiol-levonorgestrel (CLIMARAPRO) 0.045-0.015 MG/DAY    Sig: Place 1 patch onto the skin once a week.    Dispense:  4 patch    Refill:  12    Order Specific Question:   Supervising Provider    Answer:   Tania Ade H [[id number]]  F/U in 4 weeks

## 2018-09-02 ENCOUNTER — Telehealth: Payer: Self-pay | Admitting: Adult Health

## 2018-09-02 NOTE — Telephone Encounter (Signed)
Pt needs to talk about hormone patches that keep falling off

## 2018-09-03 MED ORDER — CONJ ESTROGENS-BAZEDOXIFENE 0.45-20 MG PO TABS: ORAL_TABLET | ORAL | 3 refills | Status: DC

## 2018-09-03 NOTE — Telephone Encounter (Signed)
Pt says patches fall off, will not stick, even if cleans skin with alcohol, will rx duavee

## 2018-09-07 ENCOUNTER — Encounter: Payer: Self-pay | Admitting: Orthopedic Surgery

## 2018-09-07 ENCOUNTER — Ambulatory Visit (INDEPENDENT_AMBULATORY_CARE_PROVIDER_SITE_OTHER): Payer: BLUE CROSS/BLUE SHIELD

## 2018-09-07 ENCOUNTER — Ambulatory Visit (INDEPENDENT_AMBULATORY_CARE_PROVIDER_SITE_OTHER): Payer: Medicare Other | Admitting: Orthopedic Surgery

## 2018-09-07 VITALS — BP 138/86 | HR 74 | Ht 63.5 in | Wt 259.0 lb

## 2018-09-07 DIAGNOSIS — M25462 Effusion, left knee: Secondary | ICD-10-CM

## 2018-09-07 DIAGNOSIS — M25561 Pain in right knee: Secondary | ICD-10-CM

## 2018-09-07 DIAGNOSIS — Z6841 Body Mass Index (BMI) 40.0 and over, adult: Secondary | ICD-10-CM | POA: Diagnosis not present

## 2018-09-07 DIAGNOSIS — Z87891 Personal history of nicotine dependence: Secondary | ICD-10-CM

## 2018-09-07 NOTE — Progress Notes (Signed)
Progress Note   Patient ID: [name], female   DOB: 1966-05-10, 53 y.o.   MRN: [id number]   Chief Complaint  Patient presents with  . Knee Pain    Bilat    HPI The patient presents for re-evaluation of left knee and new evaluation of the right knee  Right knee right leg pain right leg swelling pain behind the right knee in the popliteal fossa  Location as stated Duration worse in the last 4 weeks duration longer approximately a year or more Quality dull ache Severity 7 8 Associated with weightbearing  Review of Systems  Constitutional: Negative for chills and fever.  Musculoskeletal: Positive for back pain and joint pain.  Skin: Negative.   Neurological: Negative for focal weakness.   Current Meds  Medication Sig  . albuterol (PROVENTIL HFA;VENTOLIN HFA) 108 (90 Base) MCG/ACT inhaler Inhale 2 puffs into the lungs every 6 (six) hours as needed for wheezing or shortness of breath.  . amphetamine-dextroamphetamine (ADDERALL) 20 MG tablet Take 10 mg by mouth 3 (three) times daily.   . Aspirin-Salicylamide-Caffeine (BC HEADACHE POWDER PO) Take 1 packet by mouth daily as needed (headaches).  Marland Kitchen BIOTIN PO Take by mouth daily.  . cetirizine (ZYRTEC) 10 MG tablet Take 10 mg by mouth daily.  . Cholecalciferol (VITAMIN D-3) 5000 units TABS Take 5,000 Units by mouth daily.  Marland Kitchen Conj Estrogens-Bazedoxifene (DUAVEE) 0.45-20 MG TABS Take 1 daily  . diclofenac (VOLTAREN) 75 MG EC tablet Take 1 tablet (75 mg total) by mouth 2 (two) times daily with a meal.  . docusate sodium (COLACE) 100 MG capsule Take 200 mg by mouth at bedtime.   Marland Kitchen EPINEPHrine 0.3 mg/0.3 mL IJ SOAJ injection Inject 0.3 mg into the muscle daily as needed (for anaphalytic allergic reactions).   Marland Kitchen escitalopram (LEXAPRO) 20 MG tablet Take 20 mg by mouth at bedtime.   . 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 Applied to elbows.  . gabapentin (NEURONTIN) 300 MG capsule  Take 300 mg by mouth 3 (three) times daily.   . hyoscyamine (LEVSIN SL) 0.125 MG SL tablet Place 1 tablet (0.125 mg total) under the tongue every 4 (four) hours as needed.  . methocarbamol (ROBAXIN) 750 MG tablet 2 (two) times daily.   . Oxycodone HCl 10 MG TABS   . pantoprazole (PROTONIX) 40 MG tablet Take 1 tablet (40 mg total) by mouth 2 (two) times daily.  Vladimir Faster Glycol-Propyl Glycol (LUBRICANT EYE DROPS) 0.4-0.3 % SOLN Place 1-2 drops into both eyes 3 (three) times daily as needed (for dry eyes.).  Marland Kitchen SUPER B COMPLEX/C PO Take by mouth daily.  Marland Kitchen tiZANidine (ZANAFLEX) 4 MG tablet   . topiramate (TOPAMAX) 50 MG tablet Take 50 mg by mouth at bedtime.     Past Medical History:  Diagnosis Date  . ADHD (attention deficit hyperactivity disorder)   . Anxiety   . Arthritis    disk disease  . Barrett esophagus   . Chronic pain   . COPD (chronic obstructive pulmonary disease) ([place])   . Depression   . Dyspnea   . Fibromyalgia   . GERD (gastroesophageal reflux disease)    IBS  . Headache   . Hot flashes 04/11/2015  . IBS (irritable bowel syndrome)   . Obesity   . Peripheral arterial disease ([place])   . Plantar fasciitis of left foot   . PTSD (post-traumatic stress disorder)   . RLQ abdominal  pain 04/11/2015  . 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 Bee Venom Anaphylaxis  . Morphine And Related Other (See Comments)    Severe headache  . Adhesive [Tape] Itching    Some types of adhesive tape and paper tape rip up the skin.       BP 138/86   Pulse 74   Ht 5' 3.5" (1.613 m)   Wt 259 lb (117.5 kg)   BMI 45.16 kg/m    Physical Exam General appearance normal Oriented x3 normal Mood pleasant affect normal Gait supported by a cane abnormal gait right and left knee longer weightbearing phase decreased stride length decreased cadence  Ortho Exam Left knee  Inspection and palpation revealed joint effusion with medial  tenderness  range of motion is normal No instability was detected on stress testing Muscle tone and strength was normal without tremor Skin was warm dry and intact Good pulse and temperature were noted in the extremity Sensation normal sensation around the knee itself  Right knee Medial joint line tenderness popliteal tenderness No effusion Range of motion normal Stability normal No tremor Skin intact Pulse and temperature normal Knee skin normal    MEDICAL DECISION MAKING   Imaging:  Right knee Arthritis normal alignment right knee  Encounter Diagnosis  Name Primary?  . Acute pain of right knee Yes   The patient meets the AMA guidelines for Morbid (severe) obesity with a BMI > 40.0 and I have recommended weight loss.   PLAN: (RX., injection, surgery,frx,mri/ct, XR 2 body ares) .Procedure note injection and aspiration left knee joint  Verbal consent was obtained to aspirate and inject the left knee joint   Timeout was completed to confirm the site of aspiration and injection  An 18-gauge needle was used to aspirate the left knee joint from a suprapatellar lateral approach.  The medications used were 40 mg of Depo-Medrol and 1% lidocaine 3 cc  Anesthesia was provided by ethyl chloride and the skin was prepped with alcohol.  After cleaning the skin with alcohol an 18-gauge needle was used to aspirate the right knee joint.  We obtained 12 cc of fluid. Clear   We followed this by injection of 40 mg of Depo-Medrol and 3 cc 1% lidocaine.  There were no complications. A sterile bandage was applied.   Procedure note right knee injection verbal consent was obtained to inject right knee joint  Timeout was completed to confirm the site of injection  The medications used were 40 mg of Depo-Medrol and 1% lidocaine 3 cc  Anesthesia was provided by ethyl chloride and the skin was prepped with alcohol.  After cleaning the skin with alcohol a 20-gauge needle was used to  inject the right knee joint. There were no complications. A sterile bandage was applied.   No orders of the defined types were placed in this encounter.  11:53 AM 09/07/2018

## 2018-09-21 ENCOUNTER — Ambulatory Visit (INDEPENDENT_AMBULATORY_CARE_PROVIDER_SITE_OTHER): Payer: BLUE CROSS/BLUE SHIELD | Admitting: Adult Health

## 2018-09-21 ENCOUNTER — Encounter: Payer: Self-pay | Admitting: Adult Health

## 2018-09-21 VITALS — BP 146/81 | HR 58 | Ht 63.5 in | Wt 255.5 lb

## 2018-09-21 DIAGNOSIS — Z79890 Hormone replacement therapy (postmenopausal): Secondary | ICD-10-CM

## 2018-09-21 DIAGNOSIS — R232 Flushing: Secondary | ICD-10-CM

## 2018-09-21 DIAGNOSIS — Z780 Asymptomatic menopausal state: Secondary | ICD-10-CM | POA: Diagnosis not present

## 2018-09-21 MED ORDER — ESTRADIOL-LEVONORGESTREL 0.045-0.015 MG/DAY TD PTWK: 1.0000 | MEDICATED_PATCH | TRANSDERMAL | 12 refills | Status: DC

## 2018-09-21 NOTE — Progress Notes (Signed)
Patient ID: [name], female   DOB: 1966-07-02, 53 y.o.   MRN: [id number] History of Present Illness: [name] is a 53 year old white female, married back in follow up on starting HRT.The patch would not stick, and Duavee made knees hurt.But the hot flashes did stop when on [facility]. PCP is Dr Cindie Laroche.    Current Medications, Allergies, Past Medical History, Past Surgical History, Family History and Social History were reviewed in [employer] record.     Review of Systems: +hot flashes    Physical Exam:BP (!) 146/81 (BP Location: Left Arm, Patient Position: Sitting, Cuff Size: Large)   Pulse (!) 58   Ht 5' 3.5" (1.613 m)   Wt 255 lb 8 oz (115.9 kg)   BMI 44.55 kg/m  General:  Well developed, well nourished, no acute distress Skin:  Warm and dry Psych:  No mood changes, alert and cooperative,seems happy Talk only:the Duavee helped with the hot flashes but made her knees esp right one hurt, and when stopped Duavee it stopped hurting, will try patches again.    Impression: 1. Hormone replacement therapy (HRT)   2. Menopause   3. Hot flashes       Plan:  Meds ordered this encounter  Medications  . estradiol-levonorgestrel (CLIMARAPRO) 0.045-0.015 MG/DAY    Sig: Place 1 patch onto the skin once a week.    Dispense:  4 patch    Refill:  12    Order Specific Question:   Supervising Provider    Answer:   Tania Ade H [[id number]]   F/U in 3 months of sooner if needed.

## 2018-10-05 ENCOUNTER — Ambulatory Visit (INDEPENDENT_AMBULATORY_CARE_PROVIDER_SITE_OTHER): Payer: BLUE CROSS/BLUE SHIELD | Admitting: Orthopedic Surgery

## 2018-10-05 ENCOUNTER — Encounter: Payer: Self-pay | Admitting: Orthopedic Surgery

## 2018-10-05 VITALS — BP 153/88 | HR 67 | Ht 63.5 in | Wt 266.0 lb

## 2018-10-05 DIAGNOSIS — Z6841 Body Mass Index (BMI) 40.0 and over, adult: Secondary | ICD-10-CM | POA: Diagnosis not present

## 2018-10-05 DIAGNOSIS — M1712 Unilateral primary osteoarthritis, left knee: Secondary | ICD-10-CM

## 2018-10-05 DIAGNOSIS — M25462 Effusion, left knee: Secondary | ICD-10-CM

## 2018-10-05 DIAGNOSIS — M1711 Unilateral primary osteoarthritis, right knee: Secondary | ICD-10-CM

## 2018-10-05 NOTE — Progress Notes (Addendum)
Progress Note   Patient ID: [name], female   DOB: May 19, 1966, 53 y.o.   MRN: [id number]   Chief Complaint  Patient presents with  . Knee Pain    bilateral   . Leg Pain    right/ has appointment with back surgeon     53 YO FEMALE CHRONIC BACK PAIN CHRONIC KNEE PAIN WITH OA AND MULTIPLE AND RECURRENT EFFUSIONS, presents with recurrent pain and swelling     Review of Systems  Musculoskeletal: Positive for back pain.  Neurological: Positive for tingling.       Right leg to foot and ankle     Allergies  Allergen Reactions  . Bee Venom Anaphylaxis  . Morphine And Related Other (See Comments)    Severe headache  . Adhesive [Tape] Itching    Some types of adhesive tape and paper tape rip up the skin.     No orders of the defined types were placed in this encounter.   Current Outpatient Medications:  .  albuterol (PROVENTIL HFA;VENTOLIN HFA) 108 (90 Base) MCG/ACT inhaler, Inhale 2 puffs into the lungs every 6 (six) hours as needed for wheezing or shortness of breath., Disp: , Rfl:  .  amphetamine-dextroamphetamine (ADDERALL) 20 MG tablet, Take 10 mg by mouth 3 (three) times daily. , Disp: , Rfl:  .  Aspirin-Salicylamide-Caffeine (BC HEADACHE POWDER PO), Take 1 packet by mouth daily as needed (headaches)., Disp: , Rfl:  .  BIOTIN PO, Take by mouth daily., Disp: , Rfl:  .  cetirizine (ZYRTEC) 10 MG tablet, Take 10 mg by mouth daily., Disp: , Rfl:  .  Cholecalciferol (VITAMIN D-3) 5000 units TABS, Take 5,000 Units by mouth daily., Disp: , Rfl:  .  diclofenac (VOLTAREN) 75 MG EC tablet, Take 1 tablet (75 mg total) by mouth 2 (two) times daily with a meal., Disp: 60 tablet, Rfl: 2 .  docusate sodium (COLACE) 100 MG capsule, Take 200 mg by mouth at bedtime. , Disp: , Rfl:  .  EPINEPHrine 0.3 mg/0.3 mL IJ SOAJ injection, Inject 0.3 mg into the muscle daily as needed (for anaphalytic allergic reactions). , Disp: , Rfl:  .  escitalopram (LEXAPRO) 20 MG tablet, Take 20 mg by mouth at  bedtime. , Disp: , Rfl:  .  estradiol-levonorgestrel (CLIMARAPRO) 0.045-0.015 MG/DAY, Place 1 patch onto the skin once a week., Disp: 4 patch, Rfl: 12 .  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 Applied to elbows., Disp: , Rfl:  .  gabapentin (NEURONTIN) 300 MG capsule, Take 300 mg by mouth 3 (three) times daily. , Disp: , Rfl:  .  hyoscyamine (LEVSIN SL) 0.125 MG SL tablet, Place 1 tablet (0.125 mg total) under the tongue every 4 (four) hours as needed., Disp: 90 tablet, Rfl: 3 .  methocarbamol (ROBAXIN) 750 MG tablet, 2 (two) times daily. , Disp: , Rfl:  .  Oxycodone HCl 10 MG TABS, 4 (four) times daily. , Disp: , Rfl:  .  pantoprazole (PROTONIX) 40 MG tablet, Take 1 tablet (40 mg total) by mouth 2 (two) times daily., Disp: 60 tablet, Rfl: 3 .  Polyethyl Glycol-Propyl Glycol (LUBRICANT EYE DROPS) 0.4-0.3 % SOLN, Place 1-2 drops into both eyes 3 (three) times daily as needed (for dry eyes.)., Disp: , Rfl:  .  tiZANidine (ZANAFLEX) 4 MG tablet, , Disp: , Rfl:  .  topiramate (TOPAMAX) 50 MG tablet, Take 50 mg by mouth at bedtime. , Disp: ,  Rfl:   BP (!) 153/88   Pulse 67   Ht 5' 3.5" (1.613 m)   Wt 266 lb (120.7 kg)   BMI 46.38 kg/m   Physical Exam Patient ambulates with a cane she has a limp  Right knee posterior pain no joint line tenderness flexion 125 degrees pain with extension but is behind the knee ligaments are stable meniscal tests are negative muscle tone is normal and the skin is intact  On the left side we see a joint effusion the joint is tender to palpation range of motion is 120 degrees ligaments are stable strength and muscle tone are normal skin is intact there is no erythema  Medical decisions:   Data  Imaging:   X-ray left knee from last visit shows a 14 to 15 degree valgus osteoarthritis right knee show mild arthritis    Encounter Diagnoses  Name Primary?  . Body mass index 40.0-44.9, adult ([place]) Yes   . Morbid obesity ([place])   . Effusion of left knee   . Primary osteoarthritis of left knee   . Primary osteoarthritis of right knee    The patient meets the AMA guidelines for Morbid (severe) obesity with a BMI > 40.0 and I have recommended weight loss.  PLAN:   I did aspirate inject the left knee with 10 cc effusion but the pain in the right knee is behind the joint   Procedure note injection and aspiration left knee joint  Verbal consent was obtained to aspirate and inject the left knee joint   Timeout was completed to confirm the site of aspiration and injection  An 18-gauge needle was used to aspirate the left knee joint from a suprapatellar lateral approach.  The medications used were 40 mg of Depo-Medrol and 1% lidocaine 3 cc  Anesthesia was provided by ethyl chloride and the skin was prepped with alcohol.  After cleaning the skin with alcohol an 18-gauge needle was used to aspirate the right knee joint.  We obtained 10 cc of fluid  We followed this by injection of 40 mg of Depo-Medrol and 3 cc 1% lidocaine.  There were no complications. A sterile bandage was applied.  Furthermore the patient at BMI 46 cannot have knee replacement surgery furthermore her pain medication would prevent her from having surgery here, recommend tertiary care facility where the pain can be managed better to prevent a failure.  Fu 1 month   Arther Abbott, MD 10/05/2018 2:16 PM

## 2018-10-06 ENCOUNTER — Ambulatory Visit (HOSPITAL_COMMUNITY)
Admission: RE | Admit: 2018-10-06 | Discharge: 2018-10-06 | Disposition: A | Discharge status: Home / Self Care | Payer: BLUE CROSS/BLUE SHIELD | Source: Ambulatory Visit | Attending: Student | Admitting: Student

## 2018-10-06 ENCOUNTER — Other Ambulatory Visit (HOSPITAL_COMMUNITY): Payer: Self-pay | Admitting: Student

## 2018-10-06 DIAGNOSIS — M79604 Pain in right leg: Secondary | ICD-10-CM | POA: Diagnosis not present

## 2018-10-06 DIAGNOSIS — M7989 Other specified soft tissue disorders: Principal | ICD-10-CM

## 2018-10-06 DIAGNOSIS — M5416 Radiculopathy, lumbar region: Secondary | ICD-10-CM | POA: Diagnosis not present

## 2018-10-07 ENCOUNTER — Other Ambulatory Visit (HOSPITAL_COMMUNITY): Payer: Self-pay | Admitting: Student

## 2018-10-07 DIAGNOSIS — M5416 Radiculopathy, lumbar region: Secondary | ICD-10-CM

## 2018-10-12 ENCOUNTER — Other Ambulatory Visit: Payer: Self-pay | Admitting: Orthopedic Surgery

## 2018-10-12 ENCOUNTER — Encounter (HOSPITAL_COMMUNITY): Payer: Self-pay

## 2018-10-12 ENCOUNTER — Ambulatory Visit (HOSPITAL_COMMUNITY): Payer: Medicare Other

## 2018-10-12 DIAGNOSIS — M25462 Effusion, left knee: Secondary | ICD-10-CM

## 2018-10-15 ENCOUNTER — Ambulatory Visit (HOSPITAL_COMMUNITY): Payer: Medicare Other

## 2018-10-22 ENCOUNTER — Encounter: Payer: Medicare Other | Admitting: Psychology

## 2018-11-04 ENCOUNTER — Ambulatory Visit (INDEPENDENT_AMBULATORY_CARE_PROVIDER_SITE_OTHER): Payer: BLUE CROSS/BLUE SHIELD | Admitting: Orthopedic Surgery

## 2018-11-04 ENCOUNTER — Other Ambulatory Visit: Payer: Self-pay

## 2018-11-04 ENCOUNTER — Encounter: Payer: Self-pay | Admitting: Orthopedic Surgery

## 2018-11-04 VITALS — BP 141/85 | HR 75 | Ht 63.5 in | Wt 266.0 lb

## 2018-11-04 DIAGNOSIS — D1723 Benign lipomatous neoplasm of skin and subcutaneous tissue of right leg: Secondary | ICD-10-CM | POA: Diagnosis not present

## 2018-11-04 DIAGNOSIS — M25562 Pain in left knee: Secondary | ICD-10-CM | POA: Diagnosis not present

## 2018-11-04 DIAGNOSIS — G8929 Other chronic pain: Secondary | ICD-10-CM | POA: Diagnosis not present

## 2018-11-04 NOTE — Patient Instructions (Signed)

## 2018-11-04 NOTE — Progress Notes (Signed)
Chief Complaint  Patient presents with  . Knee Pain    Right knee    [name] has come in for pain over the medial side of the calf of her right leg   As we noted previously she has right radicular pain scheduled for MRI on April 20   The area in question is over the right medial calf just distal to the crease of the popliteal fossa it is a 1 inch x 1 inch firm palpable mass encapsulated mobile appears to be in the subcutaneous tissue   She noticed it about 2 or 3 weeks ago   System review continues with lower back pain her left knee is still bothering her  Review of Systems  Constitutional: Negative for chills, fever and weight loss.     BP (!) 141/85   Pulse 75   Ht 5' 3.5" (1.613 m)   Wt 266 lb (120.7 kg)   BMI 46.38 kg/m   She is awake alert and oriented x3 she is ambulatory with a cane she has tenderness and palpable mass 1 x 1 inch medial calf just distal to the crease it is mobile it is tender there are no lymph nodes involved   Right knee passive flexion of the knee is 120 degrees full extension no instability no ligamentous laxity   Sensation is intact to palpation pressure and pain   Vascular exam is normal   Skin  Shows no lesions    Left knee tenderness lateral joint line full passive range of motion ligaments are stable McMurray sign is negative muscle tone is normal sensation is intact skin is normal pulse and perfusion are normal    Encounter Diagnoses  Name Primary?  . Lipoma of right lower extremity Yes  . Chronic pain of left knee    The patient meets the AMA guidelines for Morbid (severe) obesity with a BMI > 40.0 and I have recommended weight loss.  Lipoma right calf observation at present   Chronic pain left knee   procedure note left knee injection verbal consent was obtained to inject left knee joint  Timeout was completed to confirm the site of injection  The medications used were 40 mg of Depo-Medrol and 1% lidocaine 3 cc  Anesthesia  was provided by ethyl chloride and the skin was prepped with alcohol.  After cleaning the skin with alcohol a 20-gauge needle was used to inject the left knee joint. There were no complications. A sterile bandage was applied.  Follow-up 1 month in person

## 2018-11-11 DIAGNOSIS — M961 Postlaminectomy syndrome, not elsewhere classified: Secondary | ICD-10-CM | POA: Diagnosis not present

## 2018-11-11 DIAGNOSIS — Z6841 Body Mass Index (BMI) 40.0 and over, adult: Secondary | ICD-10-CM | POA: Diagnosis not present

## 2018-11-16 ENCOUNTER — Ambulatory Visit (HOSPITAL_COMMUNITY)
Admission: RE | Admit: 2018-11-16 | Discharge: 2018-11-16 | Disposition: A | Discharge status: Home / Self Care | Payer: BLUE CROSS/BLUE SHIELD | Source: Ambulatory Visit | Attending: Student | Admitting: Student

## 2018-11-16 ENCOUNTER — Other Ambulatory Visit: Payer: Self-pay

## 2018-11-16 DIAGNOSIS — M5416 Radiculopathy, lumbar region: Secondary | ICD-10-CM | POA: Diagnosis not present

## 2018-11-16 DIAGNOSIS — M5116 Intervertebral disc disorders with radiculopathy, lumbar region: Secondary | ICD-10-CM | POA: Diagnosis not present

## 2018-11-16 DIAGNOSIS — M48061 Spinal stenosis, lumbar region without neurogenic claudication: Secondary | ICD-10-CM | POA: Diagnosis not present

## 2018-11-16 MED ORDER — GADOBUTROL 1 MMOL/ML IV SOLN
10.0000 mL | Freq: Once | INTRAVENOUS | Status: AC | PRN
  Administered 2018-11-16: 10 mL via INTRAVENOUS

## 2018-11-24 DIAGNOSIS — M545 Low back pain: Secondary | ICD-10-CM | POA: Diagnosis not present

## 2018-12-07 ENCOUNTER — Encounter: Payer: Self-pay | Admitting: Orthopedic Surgery

## 2018-12-07 ENCOUNTER — Other Ambulatory Visit: Payer: Self-pay

## 2018-12-07 ENCOUNTER — Ambulatory Visit (INDEPENDENT_AMBULATORY_CARE_PROVIDER_SITE_OTHER): Payer: BC Managed Care – PPO | Admitting: Orthopedic Surgery

## 2018-12-07 VITALS — Temp 96.1°F | Resp 18 | Ht 63.5 in | Wt 266.0 lb

## 2018-12-07 DIAGNOSIS — G8929 Other chronic pain: Secondary | ICD-10-CM

## 2018-12-07 DIAGNOSIS — D1723 Benign lipomatous neoplasm of skin and subcutaneous tissue of right leg: Secondary | ICD-10-CM | POA: Diagnosis not present

## 2018-12-07 DIAGNOSIS — M25562 Pain in left knee: Secondary | ICD-10-CM

## 2018-12-07 DIAGNOSIS — M7121 Synovial cyst of popliteal space [Baker], right knee: Secondary | ICD-10-CM | POA: Diagnosis not present

## 2018-12-07 DIAGNOSIS — Z6841 Body Mass Index (BMI) 40.0 and over, adult: Secondary | ICD-10-CM

## 2018-12-07 NOTE — Progress Notes (Signed)
Chief Complaint  Patient presents with  . Knee Pain    behind right knee/ leg painful ?bakers cyst   . Knee Pain    left knee pain swelling     [name] continues to struggle with various issues related to her back recent MRI April 28 showed progressive disc disease followed by Dr. Ellene Route I believe  She still complains of pain behind her right knee she has osteoarthritis on x-ray  She appears to have clinical knee effusion  Would benefit from ultrasound to evaluate for Baker's cyst she also has a lipoma on the posterior medial calf unrelated to the posterior right knee pain  And then  Chronic left knee pain requiring monthly injection.  Patient not a surgical candidate having chronic pain from her lumbar disc disease.  Cannot have surgery now on elective basis based on her pain issues pain control problems anticipated postoperatively   Procedure note left knee injection verbal consent was obtained to inject left knee joint  Timeout was completed to confirm the site of injection  The medications used were 40 mg of Depo-Medrol and 1% lidocaine 3 cc  Anesthesia was provided by ethyl chloride and the skin was prepped with alcohol.  After cleaning the skin with alcohol a 20-gauge needle was used to inject the left knee joint. There were no complications. A sterile bandage was applied.  U/S right knee bakers cyst   Encounter Diagnoses  Name Primary?  . Lipoma of right lower extremity   . Chronic pain of left knee   . Body mass index 40.0-44.9, adult ([place])   . Cyst, baker's knee, right Yes    Follow-up in a month routine left knee injection  Ultrasound right knee.  Dr. to call results to patient  Separate E and M for right knee pain and swelling behind the knee  53 year old female with chronic right knee pain pain in the back of the knee thought to be coming from lumbar disc disease  Patient still complains of pain swelling and decreased range of motion in the right  knee.  Physical exam shows tenderness over the posterior medial calf consistent with a small lipoma and then she also has popliteal fossa tenderness and fullness with decreased range of motion in flexion.  Previous x-ray shows osteoarthritis of the right knee  Baker's cyst  Ultrasound evaluate for Baker's cyst\  [phone]

## 2018-12-07 NOTE — Patient Instructions (Signed)
Schedule for right knee U/S   Dr Lemmie Evens to call results to patient   Reg f/u in 1 month left knee injectx

## 2018-12-15 ENCOUNTER — Other Ambulatory Visit: Payer: Self-pay | Admitting: Orthopedic Surgery

## 2018-12-15 DIAGNOSIS — M25462 Effusion, left knee: Secondary | ICD-10-CM

## 2018-12-18 ENCOUNTER — Encounter: Payer: Self-pay | Admitting: *Deleted

## 2018-12-18 DIAGNOSIS — M79604 Pain in right leg: Secondary | ICD-10-CM | POA: Diagnosis not present

## 2018-12-18 DIAGNOSIS — M961 Postlaminectomy syndrome, not elsewhere classified: Secondary | ICD-10-CM | POA: Diagnosis not present

## 2018-12-18 DIAGNOSIS — M4317 Spondylolisthesis, lumbosacral region: Secondary | ICD-10-CM | POA: Diagnosis not present

## 2018-12-22 ENCOUNTER — Ambulatory Visit: Payer: Self-pay | Admitting: Adult Health

## 2018-12-28 ENCOUNTER — Other Ambulatory Visit: Payer: Self-pay | Admitting: Orthopedic Surgery

## 2018-12-28 DIAGNOSIS — M25462 Effusion, left knee: Secondary | ICD-10-CM

## 2018-12-28 MED ORDER — DICLOFENAC SODIUM 75 MG PO TBEC: DELAYED_RELEASE_TABLET | ORAL | 5 refills | Status: DC

## 2018-12-28 NOTE — Telephone Encounter (Signed)
Diclofenac 75 mg  Qty 60 Tablets  Take 1 tablet by mouth twice daily with meals.  PATIENT USES [facility]

## 2019-01-11 ENCOUNTER — Other Ambulatory Visit: Payer: Self-pay

## 2019-01-11 ENCOUNTER — Encounter: Payer: Self-pay | Admitting: Orthopedic Surgery

## 2019-01-11 ENCOUNTER — Ambulatory Visit (INDEPENDENT_AMBULATORY_CARE_PROVIDER_SITE_OTHER): Payer: BC Managed Care – PPO | Admitting: Orthopedic Surgery

## 2019-01-11 VITALS — BP 136/89 | HR 67 | Temp 96.9°F | Ht 63.5 in | Wt 266.0 lb

## 2019-01-11 DIAGNOSIS — M25562 Pain in left knee: Secondary | ICD-10-CM | POA: Diagnosis not present

## 2019-01-11 DIAGNOSIS — G8929 Other chronic pain: Secondary | ICD-10-CM | POA: Diagnosis not present

## 2019-01-11 NOTE — Progress Notes (Signed)
Chief Complaint  Patient presents with  . Knee Pain    Left knee pain    Procedure note left knee injection   verbal consent was obtained to inject left knee joint  Timeout was completed to confirm the site of injection  The medications used were 40 mg of Depo-Medrol and 1% lidocaine 3 cc  Anesthesia was provided by ethyl chloride and the skin was prepped with alcohol.  After cleaning the skin with alcohol a 20-gauge needle was used to inject the left knee joint. There were no complications. A sterile bandage was applied.  Encounter Diagnosis  Name Primary?  . Chronic pain of left knee Yes   Fu 1 month

## 2019-01-11 NOTE — Patient Instructions (Signed)

## 2019-02-10 ENCOUNTER — Encounter: Payer: Self-pay | Admitting: Orthopedic Surgery

## 2019-02-10 ENCOUNTER — Other Ambulatory Visit: Payer: Self-pay

## 2019-02-10 ENCOUNTER — Ambulatory Visit (INDEPENDENT_AMBULATORY_CARE_PROVIDER_SITE_OTHER): Payer: BC Managed Care – PPO | Admitting: Orthopedic Surgery

## 2019-02-10 VITALS — BP 115/81 | HR 91 | Temp 98.2°F | Ht 63.5 in | Wt 266.0 lb

## 2019-02-10 DIAGNOSIS — G8929 Other chronic pain: Secondary | ICD-10-CM

## 2019-02-10 DIAGNOSIS — M25562 Pain in left knee: Secondary | ICD-10-CM

## 2019-02-10 DIAGNOSIS — Z6841 Body Mass Index (BMI) 40.0 and over, adult: Secondary | ICD-10-CM

## 2019-02-10 DIAGNOSIS — M25462 Effusion, left knee: Secondary | ICD-10-CM

## 2019-02-10 MED ORDER — DICLOFENAC SODIUM 75 MG PO TBEC: DELAYED_RELEASE_TABLET | ORAL | 5 refills | Status: DC

## 2019-02-10 NOTE — Progress Notes (Signed)
Chief Complaint  Patient presents with  . Knee Pain    Lt knee    Patient ID: [name], female   DOB: 12/04/1965, 53 y.o.   MRN: [id number]  Follow up visit  Chief Complaint  Patient presents with  . Knee Pain    Lt knee    BP 115/81   Pulse 91   Ht 5' 3.5" (1.613 m)   Wt 266 lb (120.7 kg)   BMI 46.38 kg/m   No diagnosis found.  Patient requests injection left knee  Procedure note left knee injection verbal consent was obtained to inject left knee joint  Timeout was completed to confirm the site of injection  The medications used were 40 mg of Depo-Medrol and 1% lidocaine 3 cc  Anesthesia was provided by ethyl chloride and the skin was prepped with alcohol.  After cleaning the skin with alcohol a 20-gauge needle was used to inject the left knee joint. There were no complications. A sterile bandage was applied.  The patient meets the AMA guidelines for Morbid (severe) obesity with a BMI > 40.0 and I have recommended weight loss.

## 2019-03-08 DIAGNOSIS — D229 Melanocytic nevi, unspecified: Secondary | ICD-10-CM | POA: Diagnosis not present

## 2019-03-08 DIAGNOSIS — M5136 Other intervertebral disc degeneration, lumbar region: Secondary | ICD-10-CM | POA: Diagnosis not present

## 2019-03-08 DIAGNOSIS — M545 Low back pain: Secondary | ICD-10-CM | POA: Diagnosis not present

## 2019-03-08 DIAGNOSIS — R030 Elevated blood-pressure reading, without diagnosis of hypertension: Secondary | ICD-10-CM | POA: Diagnosis not present

## 2019-03-08 DIAGNOSIS — L409 Psoriasis, unspecified: Secondary | ICD-10-CM | POA: Diagnosis not present

## 2019-03-08 DIAGNOSIS — Z6841 Body Mass Index (BMI) 40.0 and over, adult: Secondary | ICD-10-CM | POA: Diagnosis not present

## 2019-03-15 ENCOUNTER — Ambulatory Visit: Payer: BC Managed Care – PPO | Admitting: Orthopedic Surgery

## 2019-03-22 ENCOUNTER — Ambulatory Visit (INDEPENDENT_AMBULATORY_CARE_PROVIDER_SITE_OTHER): Payer: BC Managed Care – PPO | Admitting: Orthopedic Surgery

## 2019-03-22 ENCOUNTER — Other Ambulatory Visit: Payer: Self-pay

## 2019-03-22 ENCOUNTER — Encounter: Payer: Self-pay | Admitting: Orthopedic Surgery

## 2019-03-22 VITALS — BP 156/76 | HR 74 | Ht 63.5 in | Wt 271.0 lb

## 2019-03-22 DIAGNOSIS — G8929 Other chronic pain: Secondary | ICD-10-CM

## 2019-03-22 DIAGNOSIS — M25562 Pain in left knee: Secondary | ICD-10-CM | POA: Diagnosis not present

## 2019-03-22 NOTE — Progress Notes (Signed)
Chief Complaint  Patient presents with  . Knee Pain    left     [name] comes in for requested left knee injection  Attempted aspiration was negative left knee was injected  Procedure note left knee injection   verbal consent was obtained to inject left knee joint  Timeout was completed to confirm the site of injection  The medications used were 40 mg of Depo-Medrol and 1% lidocaine 3 cc  Anesthesia was provided by ethyl chloride and the skin was prepped with alcohol.  After cleaning the skin with alcohol a 20-gauge needle was used to inject the left knee joint. There were no complications. A sterile bandage was applied.  Encounter Diagnosis  Name Primary?  . Chronic pain of left knee Yes

## 2019-04-07 DIAGNOSIS — Z6841 Body Mass Index (BMI) 40.0 and over, adult: Secondary | ICD-10-CM | POA: Insufficient documentation

## 2019-04-28 ENCOUNTER — Other Ambulatory Visit: Payer: Self-pay

## 2019-04-28 ENCOUNTER — Ambulatory Visit (INDEPENDENT_AMBULATORY_CARE_PROVIDER_SITE_OTHER): Payer: BC Managed Care – PPO | Admitting: Orthopedic Surgery

## 2019-04-28 ENCOUNTER — Encounter: Payer: Self-pay | Admitting: Orthopedic Surgery

## 2019-04-28 VITALS — BP 134/78 | HR 82 | Ht 63.5 in | Wt 271.0 lb

## 2019-04-28 DIAGNOSIS — G8929 Other chronic pain: Secondary | ICD-10-CM | POA: Diagnosis not present

## 2019-04-28 DIAGNOSIS — M25562 Pain in left knee: Secondary | ICD-10-CM

## 2019-04-28 NOTE — Progress Notes (Signed)
Chief Complaint  Patient presents with  . Knee Pain    left    Procedure note left knee injection with aspiration  verbal consent was obtained to inject left knee joint  Timeout was completed to confirm the site of injection  The medications used were 40 mg of Depo-Medrol and 1% lidocaine 3 cc  Anesthesia was provided by ethyl chloride and the skin was prepped with alcohol.  After cleaning the skin with alcohol a 20-gauge needle was used to aspirate 15 cc clear yellow fluid    inject the left knee joint. There were no complications. A sterile bandage was applied.   Encounter Diagnosis  Name Primary?  . Chronic pain of left knee Yes

## 2019-04-28 NOTE — Patient Instructions (Signed)

## 2019-05-04 ENCOUNTER — Encounter (INDEPENDENT_AMBULATORY_CARE_PROVIDER_SITE_OTHER): Payer: Self-pay | Admitting: Internal Medicine

## 2019-05-04 ENCOUNTER — Ambulatory Visit (INDEPENDENT_AMBULATORY_CARE_PROVIDER_SITE_OTHER): Payer: BC Managed Care – PPO | Admitting: Internal Medicine

## 2019-05-04 ENCOUNTER — Other Ambulatory Visit: Payer: Self-pay

## 2019-05-04 VITALS — BP 138/88 | HR 67 | Temp 98.4°F | Ht 65.5 in | Wt 265.6 lb

## 2019-05-04 DIAGNOSIS — K2270 Barrett's esophagus without dysplasia: Secondary | ICD-10-CM | POA: Diagnosis not present

## 2019-05-04 DIAGNOSIS — K2100 Gastro-esophageal reflux disease with esophagitis, without bleeding: Secondary | ICD-10-CM | POA: Diagnosis not present

## 2019-05-04 DIAGNOSIS — K5909 Other constipation: Secondary | ICD-10-CM

## 2019-05-04 MED ORDER — PANTOPRAZOLE SODIUM 40 MG PO TBEC: 40.0000 mg | DELAYED_RELEASE_TABLET | Freq: Two times a day (BID) | ORAL | 3 refills | Status: DC

## 2019-05-04 MED ORDER — LINACLOTIDE 145 MCG PO CAPS: 145.0000 ug | ORAL_CAPSULE | Freq: Every day | ORAL | 5 refills | Status: DC

## 2019-05-04 NOTE — Progress Notes (Signed)
Presenting complaint;  Follow-up for chronic GERD and constipation.  Database and subjective:  Patient is 53 year old Caucasian female who is here for scheduled visit.  She was last seen in July 2019. She has chronic GERD complicated by short segment Barrett's esophagus.  Last EGD was in July 2016 also revealing small sliding hiatal hernia.  She had single small patch of Barrett's mucosa.  Biopsy was negative for dysplasia.  She also underwent colonoscopy revealing external hemorrhoids but no polyps.  Patient was advised follow-up exam in 10 years.  Patient says she is doing well as far as heartburn is concerned.  She remains on pantoprazole twice daily.  She denies dysphagia hoarseness sore throat or chronic cough.  She says she is smoking 1 to 2 cigarettes/day.  She says a pack lasts her at least 2 weeks.  She has been on Chantix for 1 year which has helped.  She reports weight fluctuation of around 20 pounds.  She feels weight has gone down since she stopped taking diclofenac daily.  Now she is using sparingly.  She still has lower abdominal cramping and uses Levsin but is not very often.  She says stool softener is not helping anymore.  She feels constipation is making her back pain worse.  She states she is in need of another surgery.  She is on a high-fiber diet.  She also eats whole wheat bread.  She remains on pain medication 4 times a day.  Current Medications: Outpatient Encounter Medications as of 05/04/2019  Medication Sig  . albuterol (PROVENTIL HFA;VENTOLIN HFA) 108 (90 Base) MCG/ACT inhaler Inhale 2 puffs into the lungs every 6 (six) hours as needed for wheezing or shortness of breath.  . amphetamine-dextroamphetamine (ADDERALL) 20 MG tablet Take 10 mg by mouth 3 (three) times daily.   . Aspirin-Salicylamide-Caffeine (BC HEADACHE POWDER PO) Take 1 packet by mouth daily as needed (headaches).  Marland Kitchen BIOTIN PO Take by mouth daily.  . cetirizine (ZYRTEC) 10 MG tablet Take 10 mg by mouth  daily.  . CHANTIX 1 MG tablet Take 1 mg by mouth 2 (two) times daily.   . Cholecalciferol (VITAMIN D-3) 5000 units TABS Take 10,000 Units by mouth daily.   . diclofenac (VOLTAREN) 75 MG EC tablet TAKE (1) TABLET BY MOUTH TWICE DAILY WITH MEALS.  Marland Kitchen docusate sodium (COLACE) 100 MG capsule Take 300 mg by mouth at bedtime.   Marland Kitchen EPINEPHrine 0.3 mg/0.3 mL IJ SOAJ injection Inject 0.3 mg into the muscle daily as needed (for anaphalytic allergic reactions).   Marland Kitchen escitalopram (LEXAPRO) 20 MG tablet Take 20 mg by mouth at bedtime.   Marland Kitchen estradiol-levonorgestrel (CLIMARAPRO) 0.045-0.015 MG/DAY Place 1 patch onto the skin once a week.  . gabapentin (NEURONTIN) 300 MG capsule Take 300 mg by mouth 3 (three) times daily.   . hyoscyamine (LEVSIN SL) 0.125 MG SL tablet Place 1 tablet (0.125 mg total) under the tongue every 4 (four) hours as needed.  . methocarbamol (ROBAXIN) 750 MG tablet 2 (two) times daily.   . Oxycodone HCl 10 MG TABS 4 (four) times daily.   . pantoprazole (PROTONIX) 40 MG tablet Take 1 tablet (40 mg total) by mouth 2 (two) times daily.  Vladimir Faster Glycol-Propyl Glycol (LUBRICANT EYE DROPS) 0.4-0.3 % SOLN Place 1-2 drops into both eyes 3 (three) times daily as needed (for dry eyes.).  Marland Kitchen tiZANidine (ZANAFLEX) 4 MG tablet Take 4 mg by mouth at bedtime.   . topiramate (TOPAMAX) 50 MG tablet Take 50 mg by mouth  at bedtime.   . 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 Applied to elbows.   No facility-administered encounter medications on file as of 05/04/2019.      Objective: Blood pressure 138/88, pulse 67, temperature 98.4 F (36.9 C), temperature source Oral, height 5' 5.5" (1.664 m), weight 265 lb 9.6 oz (120.5 kg). Patient is alert and in no acute distress. Conjunctiva is pink. Sclera is nonicteric Oropharyngeal mucosa is normal. No neck masses or thyromegaly noted. Cardiac exam with regular rhythm normal S1 and S2.  No murmur or gallop noted. Lungs are clear to auscultation. Abdomen is full but soft and nontender with organomegaly or masses. No LE edema or clubbing noted.  Assessment:  #1.  Chronic GERD complicated by short segment Barrett's esophagus.  Last EGD was in July 2016 and biopsy was negative for dysplasia.  Her symptoms are well controlled with double dose PPI and antireflux measures.  I would like her to try decreasing PPI dose to once a day and if necessary combined that with bedtime famotidine and see how she does.  In the long run it may be safer for her to do so but if she needs double dose PPI to control her symptoms then we will continue to do so.  #2.  Chronic constipation.  She has not responded to stool softener and dietary measures anymore.  Constipation is secondary to medications particularly narcotics.  Would hold off using Movantik as issues can result in withdrawal symptoms. She is up-to-date on screening for CRC.  Next screening exam would be in July 2026.   Plan:  Discontinue Colace. Begin Linzess/linaclotide 145 mcg by mouth every morning.  Patient was given 2-week supply of samples plus prescription. Prescription for pantoprazole 40 mg p.o. twice daily sent to patient's pharmacy. However I would like for her to try dropping pantoprazole dose to 80 mg alternating with 40 mg every other day.  If necessary she can try taking famotidine OTC 20 mg at bedtime on days when she only takes 40 mg of pantoprazole. Continue high-fiber diet. Patient will call office if Linzess/linaclotide does not work in which case dose would be doubled.  However if she experience side effects will switch to another agent. Office visit in 6 months.

## 2019-05-04 NOTE — Patient Instructions (Addendum)
Try decreasing Pantoprazole to one dose a day as discussed  Can take OTC Pepcid 20 mg at bedtime as needed. Notify if Linzeszs does not work or you experience side-effects.

## 2019-05-31 ENCOUNTER — Ambulatory Visit (INDEPENDENT_AMBULATORY_CARE_PROVIDER_SITE_OTHER): Payer: BC Managed Care – PPO | Admitting: Orthopedic Surgery

## 2019-05-31 ENCOUNTER — Other Ambulatory Visit: Payer: Self-pay

## 2019-05-31 VITALS — BP 135/83 | HR 86 | Temp 98.0°F | Ht 62.0 in | Wt 268.0 lb

## 2019-05-31 DIAGNOSIS — G8929 Other chronic pain: Secondary | ICD-10-CM | POA: Diagnosis not present

## 2019-05-31 DIAGNOSIS — M25462 Effusion, left knee: Secondary | ICD-10-CM

## 2019-05-31 DIAGNOSIS — M25562 Pain in left knee: Secondary | ICD-10-CM | POA: Diagnosis not present

## 2019-05-31 NOTE — Progress Notes (Signed)
Chief Complaint  Patient presents with  . Follow-up    Recheck on left knee.    Encounter Diagnoses  Name Primary?  . Chronic pain of left knee Yes  . Effusion of left knee     Procedure note injection and aspiration left knee joint  Verbal consent was obtained to aspirate and inject the left knee joint   Timeout was completed to confirm the site of aspiration and injection  An 18-gauge needle was used to aspirate the left knee joint from a suprapatellar lateral approach.  The medications used were 40 mg of Depo-Medrol and 1% lidocaine 3 cc  Anesthesia was provided by ethyl chloride and the skin was prepped with alcohol.  After cleaning the skin with alcohol an 18-gauge needle was used to aspirate the right knee joint.  We obtained 15 cc of fluid CLOUDY WITH CARTILAGE   We followed this by injection of 40 mg of Depo-Medrol and 3 cc 1% lidocaine.  There were no complications. A sterile bandage was applied.   Fu 1 month

## 2019-06-30 ENCOUNTER — Ambulatory Visit (INDEPENDENT_AMBULATORY_CARE_PROVIDER_SITE_OTHER): Payer: BC Managed Care – PPO | Admitting: Orthopedic Surgery

## 2019-06-30 VITALS — BP 156/94 | HR 70 | Ht 62.0 in | Wt 270.0 lb

## 2019-06-30 DIAGNOSIS — M25462 Effusion, left knee: Secondary | ICD-10-CM

## 2019-06-30 DIAGNOSIS — M25562 Pain in left knee: Secondary | ICD-10-CM | POA: Diagnosis not present

## 2019-06-30 DIAGNOSIS — Z6841 Body Mass Index (BMI) 40.0 and over, adult: Secondary | ICD-10-CM

## 2019-06-30 DIAGNOSIS — G8929 Other chronic pain: Secondary | ICD-10-CM

## 2019-06-30 NOTE — Progress Notes (Signed)
Chief Complaint  Patient presents with  . Follow-up    Left Knee   [name] says her knee is really hurting today  Looks really swollen Request aspiration left knee  Procedure note injection and aspiration left knee joint  Verbal consent was obtained to aspirate and inject the left knee joint   Timeout was completed to confirm the site of aspiration and injection  An 18-gauge needle was used to aspirate the left knee joint from a suprapatellar lateral approach.  The medications used were 40 mg of Depo-Medrol and 1% lidocaine 3 cc  Anesthesia was provided by ethyl chloride and the skin was prepped with alcohol.  After cleaning the skin with alcohol an 18-gauge needle was used to aspirate the right knee joint.  We obtained 20 cc of fluid clear  We followed this by injection of 40 mg of Depo-Medrol and 3 cc 1% lidocaine.  There were no complications. A sterile bandage was applied.  Encounter Diagnoses  Name Primary?  . Chronic pain of left knee Yes  . Effusion of left knee   . Body mass index 45.0-49.9, adult ([place])   . Morbid obesity ([place])    F/u 1 month

## 2019-07-06 DIAGNOSIS — I10 Essential (primary) hypertension: Secondary | ICD-10-CM | POA: Insufficient documentation

## 2019-07-14 ENCOUNTER — Other Ambulatory Visit (HOSPITAL_COMMUNITY): Payer: Self-pay | Admitting: Student

## 2019-07-14 ENCOUNTER — Telehealth: Payer: Self-pay | Admitting: *Deleted

## 2019-07-14 ENCOUNTER — Other Ambulatory Visit: Payer: Self-pay | Admitting: Student

## 2019-07-14 DIAGNOSIS — M545 Low back pain, unspecified: Secondary | ICD-10-CM

## 2019-07-15 ENCOUNTER — Other Ambulatory Visit: Payer: Self-pay

## 2019-07-15 ENCOUNTER — Ambulatory Visit: Payer: BC Managed Care – PPO | Attending: Student | Admitting: Physical Therapy

## 2019-07-15 DIAGNOSIS — G8929 Other chronic pain: Secondary | ICD-10-CM | POA: Diagnosis present

## 2019-07-15 DIAGNOSIS — M545 Low back pain: Secondary | ICD-10-CM | POA: Insufficient documentation

## 2019-07-15 NOTE — Therapy (Signed)
[facility] [place], [place], [address] Phone: [phone]   Fax:  [phone]  Physical Therapy Evaluation  Patient Details  Name: [name] MRN: [id number] Date of Birth: 14-Jul-1966 Referring Provider (PT): Viona Gilmore   Encounter Date: 07/15/2019  PT End of Session - 07/15/19 1507    Visit Number  1    Number of Visits  12    Date for PT Re-Evaluation  08/26/19    PT Start Time  0148    PT Stop Time  0234    PT Time Calculation (min)  46 min    Activity Tolerance  Patient tolerated treatment well    Behavior During Therapy  [facility] for tasks assessed/performed       Past Medical History:  Diagnosis Date  . ADHD (attention deficit hyperactivity disorder)   . Anxiety   . Arthritis    disk disease  . Barrett esophagus   . Chronic pain   . COPD (chronic obstructive pulmonary disease) ()   . Depression   . Dyspnea   . Fibromyalgia   . GERD (gastroesophageal reflux disease)    IBS  . Headache   . Hot flashes 04/11/2015  . IBS (irritable bowel syndrome)   . Obesity   . Peripheral arterial disease ([place])   . Plantar fasciitis of left foot   . PTSD (post-traumatic stress disorder)   . RLQ abdominal pain 04/11/2015  . Vocal cord cancer [facility])    surgery corrected the problem     Past Surgical History:  Procedure Laterality Date  . ANTERIOR CERVICAL DECOMP/DISCECTOMY FUSION  08/18/2012   Procedure: ANTERIOR CERVICAL DECOMPRESSION/DISCECTOMY FUSION 2 LEVELS;  Surgeon: Faythe Ghee, MD;  Location: [place] NEURO ORS;  Service: Neurosurgery;  Laterality: N/A;  anterior cervical five-six,six-seven decompression fusion plating  . ANTERIOR LAT LUMBAR FUSION  03/19/2012   Procedure: ANTERIOR LATERAL LUMBAR FUSION 1 LEVEL;  Surgeon: Faythe Ghee, MD;  Location: [place] NEURO ORS;  Service: Neurosurgery;  Laterality: Right;  Anterolateral Lumbar Interbody Fusion, Lumbar Three-Four (Hand broke through glove and nat in the  room)  . BACK SURGERY    . BUNIONECTOMY Left 08/04/2014   Procedure: LEFT FOOT LAPIDUS BUNION CORRECTION, LEFT SECOND TARSALMETATARSAL JOINT DEBRIDEMENT AND LEFT MODIFIED MCBRIDE BUNIONECTOMY;  Surgeon: [name], MD;  Location: [place];  Service: Orthopedics;  Laterality: Left;  . CARPAL TUNNEL RELEASE Bilateral   . CHONDROPLASTY Left 12/25/2016   Procedure: CHONDROPLASTY LEFT PATELLA;  Surgeon: Carole Civil, MD;  [facility];  Service: Orthopedics;  Laterality: Left;  . CLEFT PALATE REPAIR    . COLONOSCOPY N/A 02/16/2015   Procedure: COLONOSCOPY;  Surgeon: Rogene Houston, MD;  [facility];  Service: Endoscopy;  Laterality: N/A;  155  . ENDOMETRIAL ABLATION    . ESOPHAGOGASTRODUODENOSCOPY N/A 02/16/2015   Procedure: ESOPHAGOGASTRODUODENOSCOPY (EGD);  Surgeon: Rogene Houston, MD;  [facility];  Service: Endoscopy;  Laterality: N/A;  . KNEE ARTHROSCOPY WITH MEDIAL MENISECTOMY Left 12/25/2016   Procedure: KNEE ARTHROSCOPY WITH MEDIAL AND LATERAL MENISECTOMY;  Surgeon: Carole Civil, MD;  [facility];  Service: Orthopedics;  Laterality: Left;  . LAPAROSCOPIC TUBAL LIGATION     [facility]  . MAXIMUM ACCESS (MAS)POSTERIOR LUMBAR INTERBODY FUSION (PLIF) 2 LEVEL  08/08/2016  . NOSE SURGERY    . SHOULDER OPEN ROTATOR CUFF REPAIR Right 04/02/2013   Procedure: ROTATOR CUFF REPAIR SHOULDER OPEN;  Surgeon: Carole Civil, MD;  [facility];  Service: Orthopedics;  Laterality: Right;  . THROAT SURGERY  2000   for throat cancer/no problems intubation...[facility]  . TONSILLECTOMY    . TUBAL LIGATION      There were no vitals filed for this visit.   Subjective Assessment - 07/15/19 1418    Subjective  COVID-19 screen performed prior to patient entering clinic.  The patient presents to the clinic with a long h/o low back pain.  She states that the first thing in the morning her pain is a 10+/10.  She reports it is hard for  her to stand more than 5 minutes.  She states she is also unable to walk long distances.  Prior to coming to therapy she took pain medication therefore her pain is lower.    Pertinent History  OA, COPD, GERD, fibromyalgia, PTSD, left knee scope, right RTC, CTS, cervical fusion, lumbar surgeries x 5 (per patient).    How long can you stand comfortably?  5 minutes or less.    Patient Stated Goals  Reduce pain to have a better qualkity of life.    Currently in Pain?  Yes    Pain Score  5     Pain Location  Back    Pain Orientation  Right;Left;Mid    Pain Descriptors / Indicators  Aching;Throbbing   Stinging.   Pain Onset  More than a month ago    Pain Frequency  Constant    Aggravating Factors   See above.    Pain Relieving Factors  See above.         [facility] PT Assessment - 07/15/19 0001      Assessment   Medical Diagnosis  Low back pain.    Referring Provider (PT)  Viona Gilmore    Onset Date/Surgical Date  --   Ongoing.     Precautions   Precautions  None      Restrictions   Weight Bearing Restrictions  No      Balance Screen   Has the patient fallen in the past 6 months  No    Has the patient had a decrease in activity level because of a fear of falling?   No    Is the patient reluctant to leave their home because of a fear of falling?   No      Home Environment   Living Environment  Private residence      Prior Function   Level of Independence  Independent      Posture/Postural Control   Posture/Postural Control  Postural limitations    Postural Limitations  Rounded Shoulders;Forward head      Deep Tendon Reflexes   DTR Assessment Site  Patella    Patella DTR  2+      ROM / Strength   AROM / PROM / Strength  AROM;Strength      AROM   Overall AROM Comments  Lumbar flexion limited by 50% and extension to neutral only.      Strength   Overall Strength Comments  Essentially normal bilateral LE strength.      Palpation   Palpation comment  Very tender to  palption diffusely over the patient's lumbar musculature with a great deal of tone.      Ambulation/Gait   Gait Comments  Slow and cautious with a cane.                Objective measurements completed on examination: See above findings.  [facility] Adult PT Treatment/Exercise - 07/15/19 0001      Modalities   Modalities  Ultrasound      Ultrasound   Ultrasound Location  Lumbar.    Ultrasound Parameters  U/S @ 1.50 W/CM2 x 8 minutes.                  PT Long Term Goals - 07/15/19 1551      PT LONG TERM GOAL #1   Title  Indepenent with a HEP.    Baseline  No knowledge of appropriate ther ex.    Time  6      PT LONG TERM GOAL #2   Title  Stand 20 minutes with pain not > 5/10.    Baseline  Patient states she usually has to sit after standing for 5 minutes.    Status  New      PT LONG TERM GOAL #3   Title  Morning pain-level not > 5/10.    Baseline  Patient pain is a 10+/10 when first getting out of pain in morning.    Time  6    Period  Weeks    Status  New             Plan - 07/15/19 1515    Clinical Impression Statement  The patient presents to OPPT with c/o low back pain that she has had for several years.  Herpain is severe the first thing in the morning.  Her functional mobility is greatly impaired.  She cannot stand or walk for long periods of time.  She has a great deal of palpable pain and tone diffusely over her lumbar musculature.  Patient will benefit from skilled physical therapy intervention to address deficits and pain.    Personal Factors and Comorbidities  Comorbidity 1;Comorbidity 2;Comorbidity 3+    Comorbidities  OA, COPD, GERD, fibromyalgia, PTSD, left knee scope, right RTC, CTS, cervical fusion, lumbar surgeries x 5 (per patient).    Examination-Activity Limitations  Other;Lift;Squat    Examination-Participation Restrictions  Community Activity;Other    Stability/Clinical Decision Making  Evolving/Moderate complexity     Clinical Decision Making  Moderate    Rehab Potential  Fair    PT Frequency  2x / week    PT Duration  6 weeks    PT Treatment/Interventions  ADLs/Self Care Home Management;Cryotherapy;Electrical Stimulation;Ultrasound;Moist Heat;Therapeutic activities;Therapeutic exercise;Manual techniques;Patient/family education;Passive range of motion    PT Next Visit Plan  Patient prefers the prone position over a pillow.  U/S or combo e'stim/U/S to lumbar musculature, gentle STW/M.  Low-level corre exercises.    Consulted and Agree with Plan of Care  Patient       Patient will benefit from skilled therapeutic intervention in order to improve the following deficits and impairments:  Abnormal gait, Decreased activity tolerance, Decreased mobility, Postural dysfunction, Pain, Impaired tone, Increased muscle spasms  Visit Diagnosis: Chronic right-sided low back pain without sciatica - Plan: PT plan of care cert/re-cert     Problem List Patient Active Problem List   Diagnosis Date Noted  . Dry skin 08/10/2018  . Decreased libido without sexual dysfunction 08/10/2018  . Depression 08/10/2018  . Screening for colorectal cancer 08/10/2018  . Routine cervical smear 08/10/2018  . Encounter for gynecological examination with Papanicolaou smear of cervix 08/10/2018  . Screening examination for STD (sexually transmitted disease) 08/10/2018  . Acquired spondylolisthesis of lumbosacral region 08/21/2017  . S/P left knee arthroscopy 12/25/16 07/03/2017  . Derangement of posterior horn of medial  meniscus of left knee   . Lateral meniscus, anterior horn derangement, left   . Chondromalacia of patellofemoral joint, left   . Lumbar adjacent segment disease with spondylolisthesis 08/08/2016  . RLQ abdominal pain 04/11/2015  . Hot flashes 04/11/2015  . Short-segment Barrett's esophagus 02/28/2015  . Peripheral arterial disease ([place]) 12/13/2013  . Status post rotator cuff repair 04/15/2013  . Rotator cuff tear  11/26/2012  . Rotator cuff syndrome of right shoulder 10/27/2012  . Right shoulder pain 10/27/2012  . HELICOBACTER PYLORI [place] INFECTION 11/03/2009  . DEPRESSION 11/02/2009  . RESTLESS LEG SYNDROME 11/02/2009  . GASTROESOPHAGEAL REFLUX DISEASE, CHRONIC 11/02/2009  . Chronic constipation 11/02/2009  . IRRITABLE BOWEL SYNDROME 11/02/2009  . ARTHRITIS 11/02/2009  . BACK PAIN, CHRONIC 11/02/2009  . NAUSEA 11/02/2009  . DIARRHEA 11/02/2009  . ABDOMINAL PAIN 11/02/2009  . RECTAL BLEEDING, HX OF 11/02/2009    Maudell Stanbrough, [place] MPT 07/15/2019, 3:54 PM  [facility] [address] [place], [place], [address] Phone: [phone]   Fax:  [phone]  Name: [name] MRN: [id number] Date of Birth: 1965-09-07

## 2019-07-19 ENCOUNTER — Ambulatory Visit: Payer: BC Managed Care – PPO | Admitting: Physical Therapy

## 2019-07-21 ENCOUNTER — Ambulatory Visit: Payer: BC Managed Care – PPO | Admitting: Physical Therapy

## 2019-07-21 ENCOUNTER — Other Ambulatory Visit: Payer: Self-pay

## 2019-07-21 DIAGNOSIS — G8929 Other chronic pain: Secondary | ICD-10-CM

## 2019-07-21 DIAGNOSIS — M545 Low back pain: Secondary | ICD-10-CM | POA: Diagnosis not present

## 2019-07-21 NOTE — Therapy (Signed)
[facility] [place], [place], [address] Phone: [phone]   Fax:  [phone]  Physical Therapy Treatment  Patient Details  Name: [name] MRN: [id number] Date of Birth: 26-Jul-1966 Referring Provider (PT): Viona Gilmore   Encounter Date: 07/21/2019  PT End of Session - 07/21/19 1455    Visit Number  2    Number of Visits  12    Date for PT Re-Evaluation  08/26/19    PT Start Time  0100    PT Stop Time  0157    PT Time Calculation (min)  57 min    Activity Tolerance  Patient tolerated treatment well    Behavior During Therapy  [facility] for tasks assessed/performed       Past Medical History:  Diagnosis Date  . ADHD (attention deficit hyperactivity disorder)   . Anxiety   . Arthritis    disk disease  . Barrett esophagus   . Chronic pain   . COPD (chronic obstructive pulmonary disease) ([place])   . Depression   . Dyspnea   . Fibromyalgia   . GERD (gastroesophageal reflux disease)    IBS  . Headache   . Hot flashes 04/11/2015  . IBS (irritable bowel syndrome)   . Obesity   . Peripheral arterial disease ([place])   . Plantar fasciitis of left foot   . PTSD (post-traumatic stress disorder)   . RLQ abdominal pain 04/11/2015  . Vocal cord cancer [facility])    surgery corrected the problem     Past Surgical History:  Procedure Laterality Date  . ANTERIOR CERVICAL DECOMP/DISCECTOMY FUSION  08/18/2012   Procedure: ANTERIOR CERVICAL DECOMPRESSION/DISCECTOMY FUSION 2 LEVELS;  Surgeon: Faythe Ghee, MD;  Location: [place] NEURO ORS;  Service: Neurosurgery;  Laterality: N/A;  anterior cervical five-six,six-seven decompression fusion plating  . ANTERIOR LAT LUMBAR FUSION  03/19/2012   Procedure: ANTERIOR LATERAL LUMBAR FUSION 1 LEVEL;  Surgeon: Faythe Ghee, MD;  Location: [place] NEURO ORS;  Service: Neurosurgery;  Laterality: Right;  Anterolateral Lumbar Interbody Fusion, Lumbar Three-Four (Hand broke through glove and nat in the  room)  . BACK SURGERY    . BUNIONECTOMY Left 08/04/2014   Procedure: LEFT FOOT LAPIDUS BUNION CORRECTION, LEFT SECOND TARSALMETATARSAL JOINT DEBRIDEMENT AND LEFT MODIFIED MCBRIDE BUNIONECTOMY;  Surgeon: [name], MD;  Location: [place];  Service: Orthopedics;  Laterality: Left;  . CARPAL TUNNEL RELEASE Bilateral   . CHONDROPLASTY Left 12/25/2016   Procedure: CHONDROPLASTY LEFT PATELLA;  Surgeon: Carole Civil, MD;  [facility];  Service: Orthopedics;  Laterality: Left;  . CLEFT PALATE REPAIR    . COLONOSCOPY N/A 02/16/2015   Procedure: COLONOSCOPY;  Surgeon: Rogene Houston, MD;  [facility];  Service: Endoscopy;  Laterality: N/A;  155  . ENDOMETRIAL ABLATION    . ESOPHAGOGASTRODUODENOSCOPY N/A 02/16/2015   Procedure: ESOPHAGOGASTRODUODENOSCOPY (EGD);  Surgeon: Rogene Houston, MD;  [facility];  Service: Endoscopy;  Laterality: N/A;  . KNEE ARTHROSCOPY WITH MEDIAL MENISECTOMY Left 12/25/2016   Procedure: KNEE ARTHROSCOPY WITH MEDIAL AND LATERAL MENISECTOMY;  Surgeon: Carole Civil, MD;  [facility];  Service: Orthopedics;  Laterality: Left;  . LAPAROSCOPIC TUBAL LIGATION     [facility]  . MAXIMUM ACCESS (MAS)POSTERIOR LUMBAR INTERBODY FUSION (PLIF) 2 LEVEL  08/08/2016  . NOSE SURGERY    . SHOULDER OPEN ROTATOR CUFF REPAIR Right 04/02/2013   Procedure: ROTATOR CUFF REPAIR SHOULDER OPEN;  Surgeon: Carole Civil, MD;  [facility];  Service: Orthopedics;  Laterality: Right;  . THROAT SURGERY  2000   for throat cancer/no problems intubation...[facility]  . TONSILLECTOMY    . TUBAL LIGATION      There were no vitals filed for this visit.  Subjective Assessment - 07/21/19 1450    Subjective  COVID-19 screen performed prior to patient entering clinic.  Pain bad today.    Pertinent History  OA, COPD, GERD, fibromyalgia, PTSD, left knee scope, right RTC, CTS, cervical fusion, lumbar surgeries x 5 (per patient).     How long can you stand comfortably?  5 minutes or less.    Patient Stated Goals  Reduce pain to have a better qualkity of life.    Currently in Pain?  Yes    Pain Score  8     Pain Location  Back    Pain Orientation  Right;Left;Mid    Pain Descriptors / Indicators  Aching;Throbbing    Pain Type  Chronic pain    Pain Onset  More than a month ago                       [facility] Adult PT Treatment/Exercise - 07/21/19 0001      Modalities   Modalities  Electrical Stimulation;Moist Heat;Ultrasound      Moist Heat Therapy   Number Minutes Moist Heat  15 Minutes    Moist Heat Location  Lumbar Spine      Electrical Stimulation   Electrical Stimulation Location  [place] lumbar.    Electrical Stimulation Action  IFC    Electrical Stimulation Parameters  80-150 Hz x 15 minutes.      Ultrasound   Ultrasound Location  Lower lumbar.    Ultrasound Parameters  Combo e'stim/U/S at 1.50 W/CM2 x 12 minutes.      Manual Therapy   Manual Therapy  Soft tissue mobilization    Soft tissue mobilization  In prone:  STW/M to lower lumbar region x 12 minutes.                  PT Long Term Goals - 07/15/19 1551      PT LONG TERM GOAL #1   Title  Indepenent with a HEP.    Baseline  No knowledge of appropriate ther ex.    Time  6      PT LONG TERM GOAL #2   Title  Stand 20 minutes with pain not > 5/10.    Baseline  Patient states she usually has to sit after standing for 5 minutes.    Status  New      PT LONG TERM GOAL #3   Title  Morning pain-level not > 5/10.    Baseline  Patient pain is a 10+/10 when first getting out of pain in morning.    Time  6    Period  Weeks    Status  New            Plan - 07/21/19 1453    Clinical Impression Statement  Patient with a high pain-level prior to treatment today.  She tolerated treatment well and felt better and relaxed after treatment today.    Personal Factors and Comorbidities  Comorbidity 1;Comorbidity 2;Comorbidity 3+     Comorbidities  OA, COPD, GERD, fibromyalgia, PTSD, left knee scope, right RTC, CTS, cervical fusion, lumbar surgeries x 5 (per patient).    Examination-Activity Limitations  Other;Lift;Squat    Examination-Participation Restrictions  Community Activity;Other  Stability/Clinical Decision Making  Evolving/Moderate complexity    Rehab Potential  Fair    PT Frequency  2x / week    PT Duration  6 weeks    PT Treatment/Interventions  ADLs/Self Care Home Management;Cryotherapy;Electrical Stimulation;Ultrasound;Moist Heat;Therapeutic activities;Therapeutic exercise;Manual techniques;Patient/family education;Passive range of motion    PT Next Visit Plan  Patient prefers the prone position over a pillow.  U/S or combo e'stim/U/S to lumbar musculature, gentle STW/M.  Low-level corre exercises.    Consulted and Agree with Plan of Care  Patient       Patient will benefit from skilled therapeutic intervention in order to improve the following deficits and impairments:  Abnormal gait, Decreased activity tolerance, Decreased mobility, Postural dysfunction, Pain, Impaired tone, Increased muscle spasms  Visit Diagnosis: Chronic right-sided low back pain without sciatica     Problem List Patient Active Problem List   Diagnosis Date Noted  . Dry skin 08/10/2018  . Decreased libido without sexual dysfunction 08/10/2018  . Depression 08/10/2018  . Screening for colorectal cancer 08/10/2018  . Routine cervical smear 08/10/2018  . Encounter for gynecological examination with Papanicolaou smear of cervix 08/10/2018  . Screening examination for STD (sexually transmitted disease) 08/10/2018  . Acquired spondylolisthesis of lumbosacral region 08/21/2017  . S/P left knee arthroscopy 12/25/16 07/03/2017  . Derangement of posterior horn of medial meniscus of left knee   . Lateral meniscus, anterior horn derangement, left   . Chondromalacia of patellofemoral joint, left   . Lumbar adjacent segment disease  with spondylolisthesis 08/08/2016  . RLQ abdominal pain 04/11/2015  . Hot flashes 04/11/2015  . Short-segment Barrett's esophagus 02/28/2015  . Peripheral arterial disease ([place]) 12/13/2013  . Status post rotator cuff repair 04/15/2013  . Rotator cuff tear 11/26/2012  . Rotator cuff syndrome of right shoulder 10/27/2012  . Right shoulder pain 10/27/2012  . HELICOBACTER PYLORI [place] INFECTION 11/03/2009  . DEPRESSION 11/02/2009  . RESTLESS LEG SYNDROME 11/02/2009  . GASTROESOPHAGEAL REFLUX DISEASE, CHRONIC 11/02/2009  . Chronic constipation 11/02/2009  . IRRITABLE BOWEL SYNDROME 11/02/2009  . ARTHRITIS 11/02/2009  . BACK PAIN, CHRONIC 11/02/2009  . NAUSEA 11/02/2009  . DIARRHEA 11/02/2009  . ABDOMINAL PAIN 11/02/2009  . RECTAL BLEEDING, HX OF 11/02/2009    [place], [place] MPT 07/21/2019, 2:56 PM  [facility] [place], [place], [address] Phone: [phone]   Fax:  [phone]  Name: [name] MRN: [id number] Date of Birth: [birth year]

## 2019-07-26 ENCOUNTER — Ambulatory Visit: Payer: BC Managed Care – PPO | Admitting: Physical Therapy

## 2019-07-27 ENCOUNTER — Other Ambulatory Visit: Payer: Self-pay

## 2019-07-27 ENCOUNTER — Ambulatory Visit (HOSPITAL_COMMUNITY)
Admission: RE | Admit: 2019-07-27 | Discharge: 2019-07-27 | Disposition: A | Discharge status: Home / Self Care | Payer: BC Managed Care – PPO | Source: Ambulatory Visit | Attending: Student | Admitting: Student

## 2019-07-27 DIAGNOSIS — M545 Low back pain, unspecified: Secondary | ICD-10-CM

## 2019-08-04 ENCOUNTER — Other Ambulatory Visit: Payer: Self-pay

## 2019-08-04 ENCOUNTER — Ambulatory Visit (INDEPENDENT_AMBULATORY_CARE_PROVIDER_SITE_OTHER): Payer: BC Managed Care – PPO | Admitting: Orthopedic Surgery

## 2019-08-04 VITALS — BP 134/78 | HR 67 | Temp 97.0°F | Ht 62.5 in | Wt 267.0 lb

## 2019-08-04 DIAGNOSIS — M25562 Pain in left knee: Secondary | ICD-10-CM | POA: Diagnosis not present

## 2019-08-04 DIAGNOSIS — M51369 Other intervertebral disc degeneration, lumbar region without mention of lumbar back pain or lower extremity pain: Secondary | ICD-10-CM | POA: Insufficient documentation

## 2019-08-04 DIAGNOSIS — M5136 Other intervertebral disc degeneration, lumbar region: Secondary | ICD-10-CM | POA: Insufficient documentation

## 2019-08-04 DIAGNOSIS — Z6841 Body Mass Index (BMI) 40.0 and over, adult: Secondary | ICD-10-CM

## 2019-08-04 DIAGNOSIS — G8929 Other chronic pain: Secondary | ICD-10-CM

## 2019-08-04 NOTE — Patient Instructions (Signed)
You have received an injection of steroids into the joint. 15% of patients will have increased pain within the 24 hours postinjection.   This is transient and will go away.   We recommend that you use ice packs on the injection site for 20 minutes every 2 hours and extra strength Tylenol 2 tablets every 8 as needed until the pain resolves.  If you continue to have pain after taking the Tylenol and using the ice please call the office for further instructions.  Try to keep the weight down

## 2019-08-04 NOTE — Progress Notes (Signed)
  Recheck left knee \ Chief Complaint  Patient presents with  . Follow-up    Recheck on left knee.    [name] comes in with severe pain left knee, says the knee is swollen and hurting and having trouble bending it.  She says over the last week or so the legs were swollen and then the knee got worse  Also complains of pain right knee  Left knee does not feel warm to touch there is no evidence of infection there.  Appears to have joint effusion.  Recommend aspiration she agrees  Procedure note injection and aspiration left knee joint  Verbal consent was obtained to aspirate and inject the left knee joint   Timeout was completed to confirm the site of aspiration and injection  An 18-gauge needle was used to aspirate the left knee joint from a suprapatellar lateral approach.  The medications used were 40 mg of Depo-Medrol and 1% lidocaine 3 cc  Anesthesia was provided by ethyl chloride and the skin was prepped with alcohol.  After cleaning the skin with alcohol an 18-gauge needle was used to aspirate the right knee joint.  We obtained 0 cc of fluid n/a  We followed this by injection of 40 mg of Depo-Medrol and 3 cc 1% lidocaine.  There were no complications. A sterile bandage was applied.  Unable to get any fluid out.I was ever in the joint patient was in too much pain to continue I tried twice  I injected the knee from an anterolateral approach with the knee flexed follow-up in a month  Encounter Diagnoses  Name Primary?  . Chronic pain of left knee Yes  . Body mass index 45.0-49.9, adult ([place])   . Morbid obesity ([place])    Weight counseling performed

## 2019-08-09 ENCOUNTER — Other Ambulatory Visit: Payer: Self-pay

## 2019-08-09 ENCOUNTER — Encounter: Payer: Self-pay | Admitting: Physical Therapy

## 2019-08-09 ENCOUNTER — Ambulatory Visit: Payer: BC Managed Care – PPO | Attending: Student | Admitting: Physical Therapy

## 2019-08-09 DIAGNOSIS — M545 Low back pain: Secondary | ICD-10-CM | POA: Diagnosis not present

## 2019-08-09 DIAGNOSIS — G8929 Other chronic pain: Secondary | ICD-10-CM | POA: Diagnosis present

## 2019-08-09 NOTE — Therapy (Signed)
[facility] [place], [place], [address] Phone: [phone]   Fax:  [phone]  Physical Therapy Treatment  Patient Details  Name: [name] MRN: [id number] Date of Birth: 1966/02/16 Referring Provider (PT): Viona Gilmore   Encounter Date: 08/09/2019  PT End of Session - 08/09/19 1431    Visit Number  3    Number of Visits  12    Date for PT Re-Evaluation  08/26/19    PT Start Time  0158    PT Stop Time  0244    PT Time Calculation (min)  46 min    Activity Tolerance  Patient tolerated treatment well    Behavior During Therapy  [facility] for tasks assessed/performed       Past Medical History:  Diagnosis Date  . ADHD (attention deficit hyperactivity disorder)   . Anxiety   . Arthritis    disk disease  . Barrett esophagus   . Chronic pain   . COPD (chronic obstructive pulmonary disease) ([place])   . Depression   . Dyspnea   . Fibromyalgia   . GERD (gastroesophageal reflux disease)    IBS  . Headache   . Hot flashes 04/11/2015  . IBS (irritable bowel syndrome)   . Obesity   . Peripheral arterial disease ([place])   . Plantar fasciitis of left foot   . PTSD (post-traumatic stress disorder)   . RLQ abdominal pain 04/11/2015  . Vocal cord cancer [facility])    surgery corrected the problem     Past Surgical History:  Procedure Laterality Date  . ANTERIOR CERVICAL DECOMP/DISCECTOMY FUSION  08/18/2012   Procedure: ANTERIOR CERVICAL DECOMPRESSION/DISCECTOMY FUSION 2 LEVELS;  Surgeon: Faythe Ghee, MD;  Location: [place] [facility];  Service: Neurosurgery;  Laterality: N/A;  anterior cervical five-six,six-seven decompression fusion plating  . ANTERIOR LAT LUMBAR FUSION  03/19/2012   Procedure: ANTERIOR LATERAL LUMBAR FUSION 1 LEVEL;  Surgeon: Faythe Ghee, MD;  Location: [place] [facility];  Service: Neurosurgery;  Laterality: Right;  Anterolateral Lumbar Interbody Fusion, Lumbar Three-Four (Hand broke through glove and nat in the  room)  . BACK SURGERY    . BUNIONECTOMY Left 08/04/2014   Procedure: LEFT FOOT LAPIDUS BUNION CORRECTION, LEFT SECOND TARSALMETATARSAL JOINT DEBRIDEMENT AND LEFT MODIFIED MCBRIDE BUNIONECTOMY;  Surgeon: [name], MD;  Location: [place];  Service: Orthopedics;  Laterality: Left;  . CARPAL TUNNEL RELEASE Bilateral   . CHONDROPLASTY Left 12/25/2016   Procedure: CHONDROPLASTY LEFT PATELLA;  Surgeon: Carole Civil, MD;  [facility];  Service: Orthopedics;  Laterality: Left;  . CLEFT PALATE REPAIR    . COLONOSCOPY N/A 02/16/2015   Procedure: COLONOSCOPY;  Surgeon: Rogene Houston, MD;  [facility];  Service: Endoscopy;  Laterality: N/A;  155  . ENDOMETRIAL ABLATION    . ESOPHAGOGASTRODUODENOSCOPY N/A 02/16/2015   Procedure: ESOPHAGOGASTRODUODENOSCOPY (EGD);  Surgeon: Rogene Houston, MD;  [facility];  Service: Endoscopy;  Laterality: N/A;  . KNEE ARTHROSCOPY WITH MEDIAL MENISECTOMY Left 12/25/2016   Procedure: KNEE ARTHROSCOPY WITH MEDIAL AND LATERAL MENISECTOMY;  Surgeon: Carole Civil, MD;  [facility];  Service: Orthopedics;  Laterality: Left;  . LAPAROSCOPIC TUBAL LIGATION     [facility]  . MAXIMUM ACCESS (MAS)POSTERIOR LUMBAR INTERBODY FUSION (PLIF) 2 LEVEL  08/08/2016  . NOSE SURGERY    . SHOULDER OPEN ROTATOR CUFF REPAIR Right 04/02/2013   Procedure: ROTATOR CUFF REPAIR SHOULDER OPEN;  Surgeon: Carole Civil, MD;  [facility];  Service: Orthopedics;  Laterality: Right;  . THROAT SURGERY  2000   for throat cancer/no problems intubation...[facility]  . TONSILLECTOMY    . TUBAL LIGATION      There were no vitals filed for this visit.  Subjective Assessment - 08/09/19 1428    Subjective  Patient will benefit from skilled physical therapy intervention to address deficits and pain.  I'm over a 10 today.    Pertinent History  OA, COPD, GERD, fibromyalgia, PTSD, left knee scope, right RTC, CTS, cervical fusion,  lumbar surgeries x 5 (per patient).    How long can you stand comfortably?  5 minutes or less.    Patient Stated Goals  Reduce pain to have a better quality of life.    Currently in Pain?  Yes    Pain Score  10-Worst pain ever    Pain Location  Back    Pain Orientation  Right;Left;Mid    Pain Descriptors / Indicators  Aching;Throbbing    Pain Type  Chronic pain    Pain Onset  More than a month ago                       [facility] Adult PT Treatment/Exercise - 08/09/19 0001      Modalities   Modalities  Electrical Stimulation;Moist Heat      Moist Heat Therapy   Number Minutes Moist Heat  15 Minutes    Moist Heat Location  Lumbar Spine      Electrical Stimulation   Electrical Stimulation Location  Bilateral sacral ligaments    Electrical Stimulation Action  Pre-mod.    Electrical Stimulation Parameters  80-150 Hz x 15 minutes.      Ultrasound   Ultrasound Location  Lower lumbar    Ultrasound Parameters  U/S at 1.50 W/CM2 x 12 minutes.      Manual Therapy   Manual Therapy  Soft tissue mobilization    Soft tissue mobilization  In prone:  STW/M x 11 minutes to bilateral low back/SIJ's.                  PT Long Term Goals - 07/15/19 1551      PT LONG TERM GOAL #1   Title  Indepenent with a HEP.    Baseline  No knowledge of appropriate ther ex.    Time  6      PT LONG TERM GOAL #2   Title  Stand 20 minutes with pain not > 5/10.    Baseline  Patient states she usually has to sit after standing for 5 minutes.    Status  New      PT LONG TERM GOAL #3   Title  Morning pain-level not > 5/10.    Baseline  Patient pain is a 10+/10 when first getting out of pain in morning.    Time  6    Period  Weeks    Status  New            Plan - 08/09/19 1433    Clinical Impression Statement  Patient in severe pain today.  She was very tender to palpation over her SIJ's left > right, especially her left iliolumbar ligament.    Personal Factors and  Comorbidities  Comorbidity 1;Comorbidity 2;Comorbidity 3+    Comorbidities  OA, COPD, GERD, fibromyalgia, PTSD, left knee scope, right RTC, CTS, cervical fusion, lumbar surgeries x 5 (per patient).    Examination-Activity Limitations  Other;Lift;Squat  Examination-Participation Restrictions  Community Activity;Other    Stability/Clinical Decision Making  Evolving/Moderate complexity    Rehab Potential  Fair    PT Frequency  2x / week    PT Duration  6 weeks    PT Treatment/Interventions  ADLs/Self Care Home Management;Cryotherapy;Electrical Stimulation;Ultrasound;Moist Heat;Therapeutic activities;Therapeutic exercise;Manual techniques;Patient/family education;Passive range of motion    PT Next Visit Plan  Patient prefers the prone position over a pillow.  U/S or combo e'stim/U/S to lumbar musculature, gentle STW/M.  Low-level corre exercises.    Consulted and Agree with Plan of Care  Patient       Patient will benefit from skilled therapeutic intervention in order to improve the following deficits and impairments:  Abnormal gait, Decreased activity tolerance, Decreased mobility, Postural dysfunction, Pain, Impaired tone, Increased muscle spasms  Visit Diagnosis: Chronic right-sided low back pain without sciatica     Problem List Patient Active Problem List   Diagnosis Date Noted  . Dry skin 08/10/2018  . Decreased libido without sexual dysfunction 08/10/2018  . Depression 08/10/2018  . Screening for colorectal cancer 08/10/2018  . Routine cervical smear 08/10/2018  . Encounter for gynecological examination with Papanicolaou smear of cervix 08/10/2018  . Screening examination for STD (sexually transmitted disease) 08/10/2018  . Acquired spondylolisthesis of lumbosacral region 08/21/2017  . S/P left knee arthroscopy 12/25/16 07/03/2017  . Derangement of posterior horn of medial meniscus of left knee   . Lateral meniscus, anterior horn derangement, left   . Chondromalacia of  patellofemoral joint, left   . Lumbar adjacent segment disease with spondylolisthesis 08/08/2016  . RLQ abdominal pain 04/11/2015  . Hot flashes 04/11/2015  . Short-segment Barrett's esophagus 02/28/2015  . Peripheral arterial disease ([place]) 12/13/2013  . Status post rotator cuff repair 04/15/2013  . Rotator cuff tear 11/26/2012  . Rotator cuff syndrome of right shoulder 10/27/2012  . Right shoulder pain 10/27/2012  . HELICOBACTER PYLORI [place] INFECTION 11/03/2009  . DEPRESSION 11/02/2009  . RESTLESS LEG SYNDROME 11/02/2009  . GASTROESOPHAGEAL REFLUX DISEASE, CHRONIC 11/02/2009  . Chronic constipation 11/02/2009  . IRRITABLE BOWEL SYNDROME 11/02/2009  . ARTHRITIS 11/02/2009  . BACK PAIN, CHRONIC 11/02/2009  . NAUSEA 11/02/2009  . DIARRHEA 11/02/2009  . ABDOMINAL PAIN 11/02/2009  . RECTAL BLEEDING, HX OF 11/02/2009    Stephanieann Popescu, [place] MPT 08/09/2019, 2:58 PM  [facility] [address] [place], [place], [address] Phone: [phone]   Fax:  [phone]  Name: [name] MRN: [id number] Date of Birth: [birth year]

## 2019-08-12 ENCOUNTER — Ambulatory Visit: Payer: BC Managed Care – PPO | Admitting: Physical Therapy

## 2019-08-13 ENCOUNTER — Other Ambulatory Visit: Payer: Self-pay

## 2019-08-13 ENCOUNTER — Ambulatory Visit: Payer: BC Managed Care – PPO | Admitting: Physical Therapy

## 2019-08-13 DIAGNOSIS — G8929 Other chronic pain: Secondary | ICD-10-CM

## 2019-08-13 DIAGNOSIS — M545 Low back pain: Secondary | ICD-10-CM | POA: Diagnosis not present

## 2019-08-13 NOTE — Therapy (Signed)
[facility] [place], [place], [address] Phone: [phone]   Fax:  [phone]  Physical Therapy Treatment  Patient Details  Name: [name] MRN: [id number] Date of Birth: 1966/05/03 Referring Provider (PT): Viona Gilmore   Encounter Date: 08/13/2019  PT End of Session - 08/13/19 1259    Visit Number  4    Number of Visits  12    Date for PT Re-Evaluation  08/26/19    PT Start Time  1125    PT Stop Time  1216    PT Time Calculation (min)  51 min    Activity Tolerance  Patient tolerated treatment well    Behavior During Therapy  [facility] for tasks assessed/performed       Past Medical History:  Diagnosis Date  . ADHD (attention deficit hyperactivity disorder)   . Anxiety   . Arthritis    disk disease  . Barrett esophagus   . Chronic pain   . COPD (chronic obstructive pulmonary disease) ([place])   . Depression   . Dyspnea   . Fibromyalgia   . GERD (gastroesophageal reflux disease)    IBS  . Headache   . Hot flashes 04/11/2015  . IBS (irritable bowel syndrome)   . Obesity   . Peripheral arterial disease ([place])   . Plantar fasciitis of left foot   . PTSD (post-traumatic stress disorder)   . RLQ abdominal pain 04/11/2015  . Vocal cord cancer [facility])    surgery corrected the problem     Past Surgical History:  Procedure Laterality Date  . ANTERIOR CERVICAL DECOMP/DISCECTOMY FUSION  08/18/2012   Procedure: ANTERIOR CERVICAL DECOMPRESSION/DISCECTOMY FUSION 2 LEVELS;  Surgeon: Faythe Ghee, MD;  Location: [place] [facility];  Service: Neurosurgery;  Laterality: N/A;  anterior cervical five-six,six-seven decompression fusion plating  . ANTERIOR LAT LUMBAR FUSION  03/19/2012   Procedure: ANTERIOR LATERAL LUMBAR FUSION 1 LEVEL;  Surgeon: Faythe Ghee, MD;  Location: [place] [facility];  Service: Neurosurgery;  Laterality: Right;  Anterolateral Lumbar Interbody Fusion, Lumbar Three-Four (Hand broke through glove and nat in the  room)  . BACK SURGERY    . BUNIONECTOMY Left 08/04/2014   Procedure: LEFT FOOT LAPIDUS BUNION CORRECTION, LEFT SECOND TARSALMETATARSAL JOINT DEBRIDEMENT AND LEFT MODIFIED MCBRIDE BUNIONECTOMY;  Surgeon: [name], MD;  Location: [place];  Service: Orthopedics;  Laterality: Left;  . CARPAL TUNNEL RELEASE Bilateral   . CHONDROPLASTY Left 12/25/2016   Procedure: CHONDROPLASTY LEFT PATELLA;  Surgeon: Carole Civil, MD;  [facility];  Service: Orthopedics;  Laterality: Left;  . CLEFT PALATE REPAIR    . COLONOSCOPY N/A 02/16/2015   Procedure: COLONOSCOPY;  Surgeon: Rogene Houston, MD;  [facility];  Service: Endoscopy;  Laterality: N/A;  155  . ENDOMETRIAL ABLATION    . ESOPHAGOGASTRODUODENOSCOPY N/A 02/16/2015   Procedure: ESOPHAGOGASTRODUODENOSCOPY (EGD);  Surgeon: Rogene Houston, MD;  [facility];  Service: Endoscopy;  Laterality: N/A;  . KNEE ARTHROSCOPY WITH MEDIAL MENISECTOMY Left 12/25/2016   Procedure: KNEE ARTHROSCOPY WITH MEDIAL AND LATERAL MENISECTOMY;  Surgeon: Carole Civil, MD;  [facility]: [facility];  Service: Orthopedics;  Laterality: Left;  . LAPAROSCOPIC TUBAL LIGATION     [facility]  . MAXIMUM ACCESS (MAS)POSTERIOR LUMBAR INTERBODY FUSION (PLIF) 2 LEVEL  08/08/2016  . NOSE SURGERY    . SHOULDER OPEN ROTATOR CUFF REPAIR Right 04/02/2013   Procedure: ROTATOR CUFF REPAIR SHOULDER OPEN;  Surgeon: Carole Civil, MD;  [facility];  Service: Orthopedics;  Laterality: Right;  . THROAT SURGERY  2000   for throat cancer/no problems intubation...[facility]  . TONSILLECTOMY    . TUBAL LIGATION      There were no vitals filed for this visit.  Subjective Assessment - 08/13/19 1254    Subjective  COVID-19 screen performed prior to patient entering clinic.  Hurting.    Pertinent History  OA, COPD, GERD, fibromyalgia, PTSD, left knee scope, right RTC, CTS, cervical fusion, lumbar surgeries x 5 (per patient).    How long  can you stand comfortably?  5 minutes or less.    Patient Stated Goals  Reduce pain to have a better quality of life.    Currently in Pain?  Yes    Pain Location  Back    Pain Orientation  Right;Left;Mid    Pain Descriptors / Indicators  Aching;Throbbing    Pain Type  Chronic pain    Pain Onset  More than a month ago                       [facility] Adult PT Treatment/Exercise - 08/13/19 0001      Modalities   Modalities  Electrical Stimulation;Moist Heat      Moist Heat Therapy   Number Minutes Moist Heat  15 Minutes    Moist Heat Location  Lumbar Spine      Electrical Stimulation   Electrical Stimulation Location  Bilateral lumbar region.    Electrical Stimulation Action  IFC    Electrical Stimulation Parameters  80-150 Hz x 15 minutes.    Electrical Stimulation Goals  Tone;Pain      Ultrasound   Ultrasound Location  Lower lumbar    Ultrasound Parameters  U/S at 1.50 W/CM2 x 15 minutes.      Manual Therapy   Manual Therapy  Soft tissue mobilization    Soft tissue mobilization  Light STW/M x 8 minutes to affected lumbar region.                  PT Long Term Goals - 07/15/19 1551      PT LONG TERM GOAL #1   Title  Indepenent with a HEP.    Baseline  No knowledge of appropriate ther ex.    Time  6      PT LONG TERM GOAL #2   Title  Stand 20 minutes with pain not > 5/10.    Baseline  Patient states she usually has to sit after standing for 5 minutes.    Status  New      PT LONG TERM GOAL #3   Title  Morning pain-level not > 5/10.    Baseline  Patient pain is a 10+/10 when first getting out of pain in morning.    Time  6    Period  Weeks    Status  New            Plan - 08/13/19 1257    Clinical Impression Statement  Patient with continued low back pain.  Requesting light STW/M today.    Personal Factors and Comorbidities  Comorbidity 1;Comorbidity 2;Comorbidity 3+    Comorbidities  OA, COPD, GERD, fibromyalgia, PTSD, left knee scope,  right RTC, CTS, cervical fusion, lumbar surgeries x 5 (per patient).    Examination-Activity Limitations  Other;Lift;Squat    Examination-Participation Restrictions  Community Activity;Other    Stability/Clinical Decision Making  Evolving/Moderate complexity    Rehab Potential  Fair  PT Frequency  2x / week    PT Duration  6 weeks    PT Treatment/Interventions  ADLs/Self Care Home Management;Cryotherapy;Electrical Stimulation;Ultrasound;Moist Heat;Therapeutic activities;Therapeutic exercise;Manual techniques;Patient/family education;Passive range of motion    PT Next Visit Plan  Patient prefers the prone position over a pillow.  U/S or combo e'stim/U/S to lumbar musculature, gentle STW/M.  Low-level corre exercises.    Consulted and Agree with Plan of Care  Patient       Patient will benefit from skilled therapeutic intervention in order to improve the following deficits and impairments:  Abnormal gait, Decreased activity tolerance, Decreased mobility, Postural dysfunction, Pain, Impaired tone, Increased muscle spasms  Visit Diagnosis: Chronic right-sided low back pain without sciatica     Problem List Patient Active Problem List   Diagnosis Date Noted  . Dry skin 08/10/2018  . Decreased libido without sexual dysfunction 08/10/2018  . Depression 08/10/2018  . Screening for colorectal cancer 08/10/2018  . Routine cervical smear 08/10/2018  . Encounter for gynecological examination with Papanicolaou smear of cervix 08/10/2018  . Screening examination for STD (sexually transmitted disease) 08/10/2018  . Acquired spondylolisthesis of lumbosacral region 08/21/2017  . S/P left knee arthroscopy 12/25/16 07/03/2017  . Derangement of posterior horn of medial meniscus of left knee   . Lateral meniscus, anterior horn derangement, left   . Chondromalacia of patellofemoral joint, left   . Lumbar adjacent segment disease with spondylolisthesis 08/08/2016  . RLQ abdominal pain 04/11/2015  .  Hot flashes 04/11/2015  . Short-segment Barrett's esophagus 02/28/2015  . Peripheral arterial disease ([place]) 12/13/2013  . Status post rotator cuff repair 04/15/2013  . Rotator cuff tear 11/26/2012  . Rotator cuff syndrome of right shoulder 10/27/2012  . Right shoulder pain 10/27/2012  . HELICOBACTER PYLORI [place] INFECTION 11/03/2009  . DEPRESSION 11/02/2009  . RESTLESS LEG SYNDROME 11/02/2009  . GASTROESOPHAGEAL REFLUX DISEASE, CHRONIC 11/02/2009  . Chronic constipation 11/02/2009  . IRRITABLE BOWEL SYNDROME 11/02/2009  . ARTHRITIS 11/02/2009  . BACK PAIN, CHRONIC 11/02/2009  . NAUSEA 11/02/2009  . DIARRHEA 11/02/2009  . ABDOMINAL PAIN 11/02/2009  . RECTAL BLEEDING, HX OF 11/02/2009    Shamiyah Ngu, [place] MPT 08/13/2019, 1:00 PM  [facility] [place], [place], [address] Phone: [phone]   Fax:  [phone]  Name: [name] MRN: [id number] Date of Birth: 1965-12-24

## 2019-08-16 ENCOUNTER — Other Ambulatory Visit: Payer: Self-pay

## 2019-08-16 ENCOUNTER — Encounter: Payer: Self-pay | Admitting: Physical Therapy

## 2019-08-16 ENCOUNTER — Ambulatory Visit: Payer: BC Managed Care – PPO | Admitting: Physical Therapy

## 2019-08-16 DIAGNOSIS — M545 Low back pain: Secondary | ICD-10-CM | POA: Diagnosis not present

## 2019-08-16 DIAGNOSIS — G8929 Other chronic pain: Secondary | ICD-10-CM

## 2019-08-16 NOTE — Therapy (Signed)
[facility] [place], [place], [address] Phone: [phone]   Fax:  [phone]  Physical Therapy Treatment  Patient Details  Name: [name] MRN: [id number] Date of Birth: 1966-06-07 Referring Provider (PT): Viona Gilmore   Encounter Date: 08/16/2019  PT End of Session - 08/16/19 1402    Visit Number  5    Number of Visits  12    Date for PT Re-Evaluation  08/26/19    PT Start Time  0712    PT Stop Time  1448    PT Time Calculation (min)  45 min    Activity Tolerance  Patient tolerated treatment well    Behavior During Therapy  [facility] for tasks assessed/performed       Past Medical History:  Diagnosis Date  . ADHD (attention deficit hyperactivity disorder)   . Anxiety   . Arthritis    disk disease  . Barrett esophagus   . Chronic pain   . COPD (chronic obstructive pulmonary disease) ([place])   . Depression   . Dyspnea   . Fibromyalgia   . GERD (gastroesophageal reflux disease)    IBS  . Headache   . Hot flashes 04/11/2015  . IBS (irritable bowel syndrome)   . Obesity   . Peripheral arterial disease ([place])   . Plantar fasciitis of left foot   . PTSD (post-traumatic stress disorder)   . RLQ abdominal pain 04/11/2015  . Vocal cord cancer [facility])    surgery corrected the problem     Past Surgical History:  Procedure Laterality Date  . ANTERIOR CERVICAL DECOMP/DISCECTOMY FUSION  08/18/2012   Procedure: ANTERIOR CERVICAL DECOMPRESSION/DISCECTOMY FUSION 2 LEVELS;  Surgeon: Faythe Ghee, MD;  Location: [facility];  Service: Neurosurgery;  Laterality: N/A;  anterior cervical five-six,six-seven decompression fusion plating  . ANTERIOR LAT LUMBAR FUSION  03/19/2012   Procedure: ANTERIOR LATERAL LUMBAR FUSION 1 LEVEL;  Surgeon: Faythe Ghee, MD;  Location: [facility];  Service: Neurosurgery;  Laterality: Right;  Anterolateral Lumbar Interbody Fusion, Lumbar Three-Four (Hand broke through glove and nat in the  room)  . BACK SURGERY    . BUNIONECTOMY Left 08/04/2014   Procedure: LEFT FOOT LAPIDUS BUNION CORRECTION, LEFT SECOND TARSALMETATARSAL JOINT DEBRIDEMENT AND LEFT MODIFIED MCBRIDE BUNIONECTOMY;  Surgeon: [name], MD;  Location: [facility];  Service: Orthopedics;  Laterality: Left;  . CARPAL TUNNEL RELEASE Bilateral   . CHONDROPLASTY Left 12/25/2016   Procedure: CHONDROPLASTY LEFT PATELLA;  Surgeon: Carole Civil, MD;  Location: [facility];  Service: Orthopedics;  Laterality: Left;  . CLEFT PALATE REPAIR    . COLONOSCOPY N/A 02/16/2015   Procedure: COLONOSCOPY;  Surgeon: Rogene Houston, MD;  [facility];  Service: Endoscopy;  Laterality: N/A;  155  . ENDOMETRIAL ABLATION    . ESOPHAGOGASTRODUODENOSCOPY N/A 02/16/2015   Procedure: ESOPHAGOGASTRODUODENOSCOPY (EGD);  Surgeon: Rogene Houston, MD;  [facility];  Service: Endoscopy;  Laterality: N/A;  . KNEE ARTHROSCOPY WITH MEDIAL MENISECTOMY Left 12/25/2016   Procedure: KNEE ARTHROSCOPY WITH MEDIAL AND LATERAL MENISECTOMY;  Surgeon: Carole Civil, MD;  Location: [facility];  Service: Orthopedics;  Laterality: Left;  . LAPAROSCOPIC TUBAL LIGATION     [facility]  . MAXIMUM ACCESS (MAS)POSTERIOR LUMBAR INTERBODY FUSION (PLIF) 2 LEVEL  08/08/2016  . NOSE SURGERY    . SHOULDER OPEN ROTATOR CUFF REPAIR Right 04/02/2013   Procedure: ROTATOR CUFF REPAIR SHOULDER OPEN;  Surgeon: Carole Civil, MD;  Location: [facility];  Service: Orthopedics;  Laterality: Right;  . THROAT SURGERY  2000   for throat cancer/no problems intubation...[facility]  . TONSILLECTOMY    . TUBAL LIGATION      There were no vitals filed for this visit.  Subjective Assessment - 08/16/19 1402    Subjective  COVID-19 screen performed prior to patient entering clinic.  Discharging today as back surgery is planned.    Pertinent History  OA, COPD, GERD, fibromyalgia, PTSD, left knee scope, right RTC, CTS, cervical fusion, lumbar  surgeries x 5 (per patient).    How long can you stand comfortably?  5 minutes or less.    Patient Stated Goals  Reduce pain to have a better quality of life.    Currently in Pain?  Yes    Pain Score  9     Pain Location  Back    Pain Orientation  Mid;Right;Left    Pain Descriptors / Indicators  Discomfort;Aching    Pain Type  Chronic pain    Pain Onset  More than a month ago    Pain Frequency  Constant         [facility] PT Assessment - 08/16/19 0001      Assessment   Medical Diagnosis  Low back pain.    Referring Provider (PT)  Viona Gilmore      Precautions   Precautions  None      Restrictions   Weight Bearing Restrictions  No                   [facility] Adult PT Treatment/Exercise - 08/16/19 0001      Modalities   Modalities  Electrical Stimulation;Moist Heat;Ultrasound      Moist Heat Therapy   Number Minutes Moist Heat  15 Minutes    Moist Heat Location  Lumbar Spine      Electrical Stimulation   Electrical Stimulation Location  Bilateral lumbar region.    Electrical Stimulation Action  Pre-Mod    Electrical Stimulation Parameters  80-150 hz x15 min    Electrical Stimulation Goals  Tone;Pain      Ultrasound   Ultrasound Location  B low back    Ultrasound Parameters  1.5 w/cm2, 100%, 1 mhz x10 min    Ultrasound Goals  Pain      Manual Therapy   Manual Therapy  Soft tissue mobilization    Soft tissue mobilization  STW to B QL, thoracolumbar paraspinals to reduce and pain                  PT Long Term Goals - 08/16/19 1545      PT LONG TERM GOAL #1   Title  Indepenent with a HEP.    Baseline  No knowledge of appropriate ther ex.    Time  6    Status  Unable to assess      PT LONG TERM GOAL #2   Title  Stand 20 minutes with pain not > 5/10.    Baseline  Patient states she usually has to sit after standing for 5 minutes.    Status  Not Met      PT LONG TERM GOAL #3   Title  Morning pain-level not > 5/10.    Baseline  Patient pain  is a 10+/10 when first getting out of pain in morning.    Time  6    Period  Weeks    Status  Not Met  Plan - 08/16/19 1445    Clinical Impression Statement  Patient presented in clinic with reports of continues high level thoracolumbar pain. Lack of progression noted towards goals and patient has another thoracolumbar surgery planned but not yet scheduled at this time. Patient limited with ADLs secondary to pain and feeling of tightness. Palpable tone noted throughout B thoracolumbar musculature. No reports of sensitivity or pain during today's manual therapy. Normal modalities response noted following removal of the modalities.    Personal Factors and Comorbidities  Comorbidity 1;Comorbidity 2;Comorbidity 3+    Comorbidities  OA, COPD, GERD, fibromyalgia, PTSD, left knee scope, right RTC, CTS, cervical fusion, lumbar surgeries x 5 (per patient).    Examination-Activity Limitations  Other;Lift;Squat    Examination-Participation Restrictions  Community Activity;Other    Stability/Clinical Decision Making  Evolving/Moderate complexity    Rehab Potential  Fair    PT Frequency  2x / week    PT Duration  6 weeks    PT Treatment/Interventions  ADLs/Self Care Home Management;Cryotherapy;Electrical Stimulation;Ultrasound;Moist Heat;Therapeutic activities;Therapeutic exercise;Manual techniques;Patient/family education;Passive range of motion    PT Next Visit Plan  D/C for lack of progress to goals and another surgery planned.    Consulted and Agree with Plan of Care  Patient       Patient will benefit from skilled therapeutic intervention in order to improve the following deficits and impairments:  Abnormal gait, Decreased activity tolerance, Decreased mobility, Postural dysfunction, Pain, Impaired tone, Increased muscle spasms  Visit Diagnosis: Chronic right-sided low back pain without sciatica     Problem List Patient Active Problem List   Diagnosis Date Noted  . Dry skin  08/10/2018  . Decreased libido without sexual dysfunction 08/10/2018  . Depression 08/10/2018  . Screening for colorectal cancer 08/10/2018  . Routine cervical smear 08/10/2018  . Encounter for gynecological examination with Papanicolaou smear of cervix 08/10/2018  . Screening examination for STD (sexually transmitted disease) 08/10/2018  . Acquired spondylolisthesis of lumbosacral region 08/21/2017  . S/P left knee arthroscopy 12/25/16 07/03/2017  . Derangement of posterior horn of medial meniscus of left knee   . Lateral meniscus, anterior horn derangement, left   . Chondromalacia of patellofemoral joint, left   . Lumbar adjacent segment disease with spondylolisthesis 08/08/2016  . RLQ abdominal pain 04/11/2015  . Hot flashes 04/11/2015  . Short-segment Barrett's esophagus 02/28/2015  . Peripheral arterial disease ([place]) 12/13/2013  . Status post rotator cuff repair 04/15/2013  . Rotator cuff tear 11/26/2012  . Rotator cuff syndrome of right shoulder 10/27/2012  . Right shoulder pain 10/27/2012  . HELICOBACTER PYLORI [place] INFECTION 11/03/2009  . DEPRESSION 11/02/2009  . RESTLESS LEG SYNDROME 11/02/2009  . GASTROESOPHAGEAL REFLUX DISEASE, CHRONIC 11/02/2009  . Chronic constipation 11/02/2009  . IRRITABLE BOWEL SYNDROME 11/02/2009  . ARTHRITIS 11/02/2009  . BACK PAIN, CHRONIC 11/02/2009  . NAUSEA 11/02/2009  . DIARRHEA 11/02/2009  . ABDOMINAL PAIN 11/02/2009  . RECTAL BLEEDING, HX OF 11/02/2009    Standley Brooking, PTA 08/16/2019, 3:46 PM  [facility] [place], [place], [address] Phone: [phone]   Fax:  [phone]  Name: KAMEE BOBST MRN: [id number] Date of Birth: [birth year]  PHYSICAL THERAPY DISCHARGE SUMMARY  Visits from Start of Care: 3.  Current functional level related to goals / functional outcomes: See above.   Remaining deficits: No goals met.     Education / Equipment: Patient plans on  having surgery. Plan: Patient agrees to discharge.  Patient goals were not met.  Patient is being discharged due to lack of progress.  ?????          Mali Applegate MPT

## 2019-08-20 IMAGING — MG DIGITAL DIAGNOSTIC BILATERAL MAMMOGRAM WITH TOMO AND CAD
6 of 10 series · 6 of 30 positions shown · non-contrast
Comparison: Previous exam(s).

CLINICAL DATA: 51-year-old patient with recent tenderness in the
outer right breast that is intermittent. She does not palpate a lump
in either breast.

EXAM:
DIGITAL DIAGNOSTIC BILATERAL MAMMOGRAM WITH CAD AND TOMO

[R CC synth-2D]
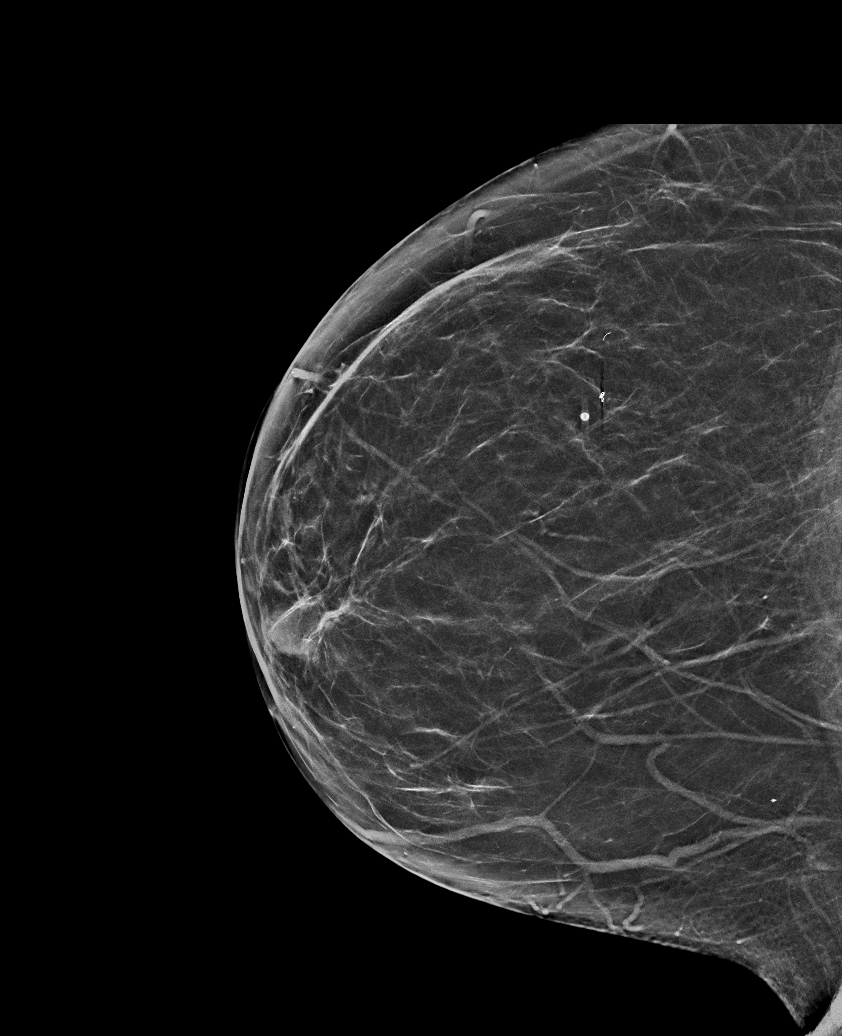

[L CC synth-2D]
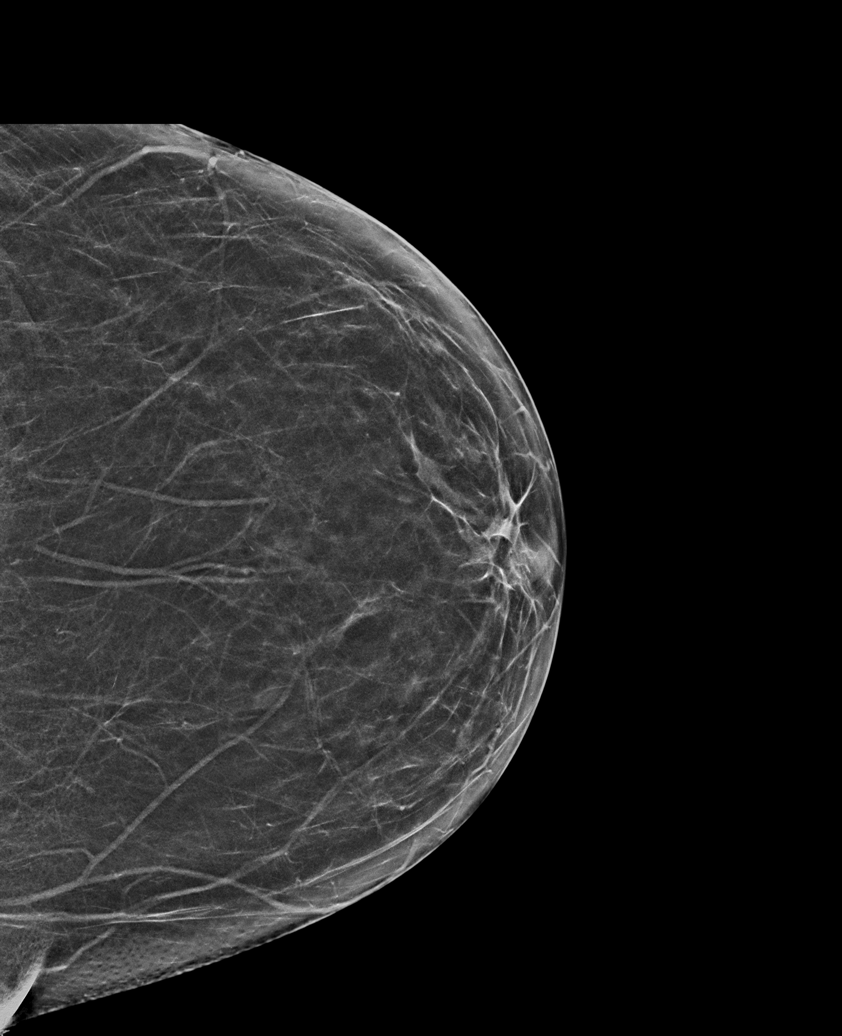

[R MLO synth-2D]
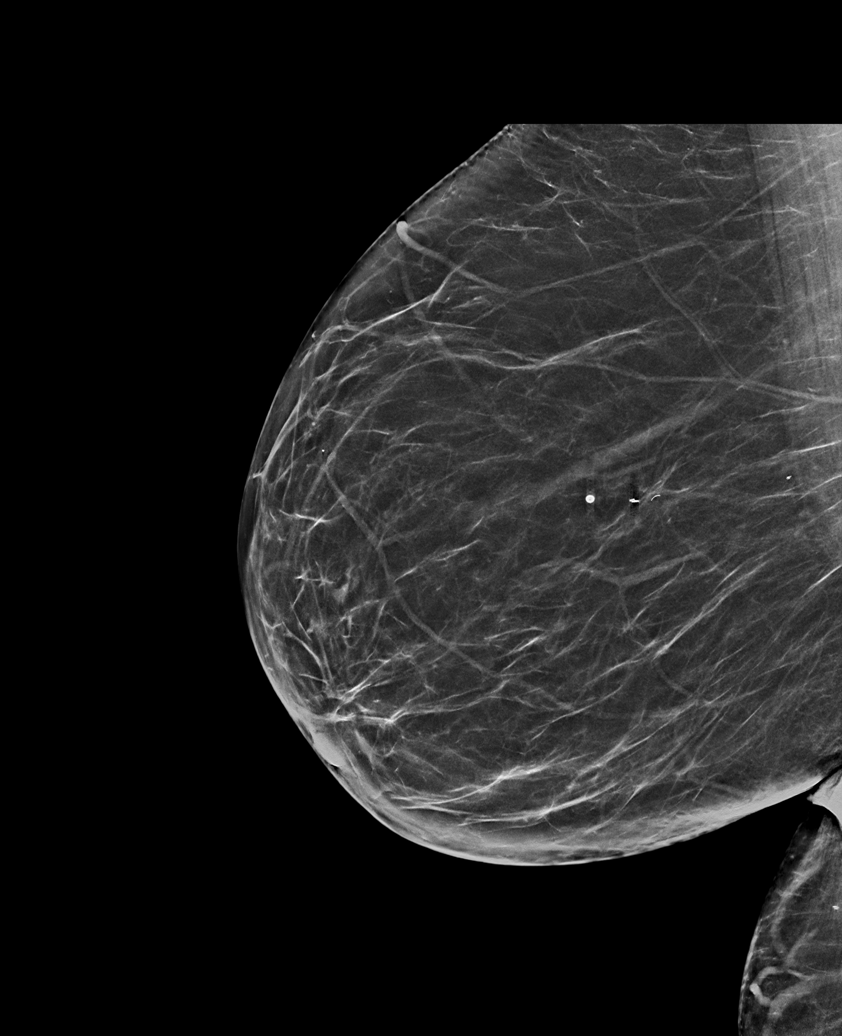

[R TAN synth-2D]
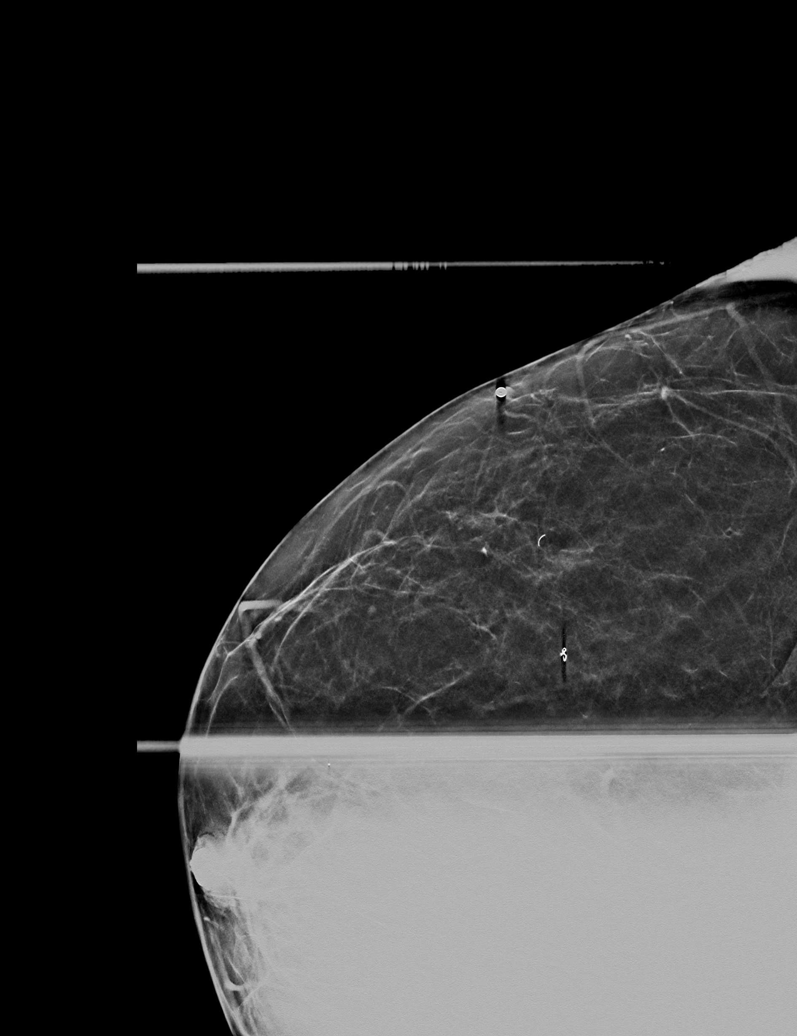

[L MLO synth-2D]
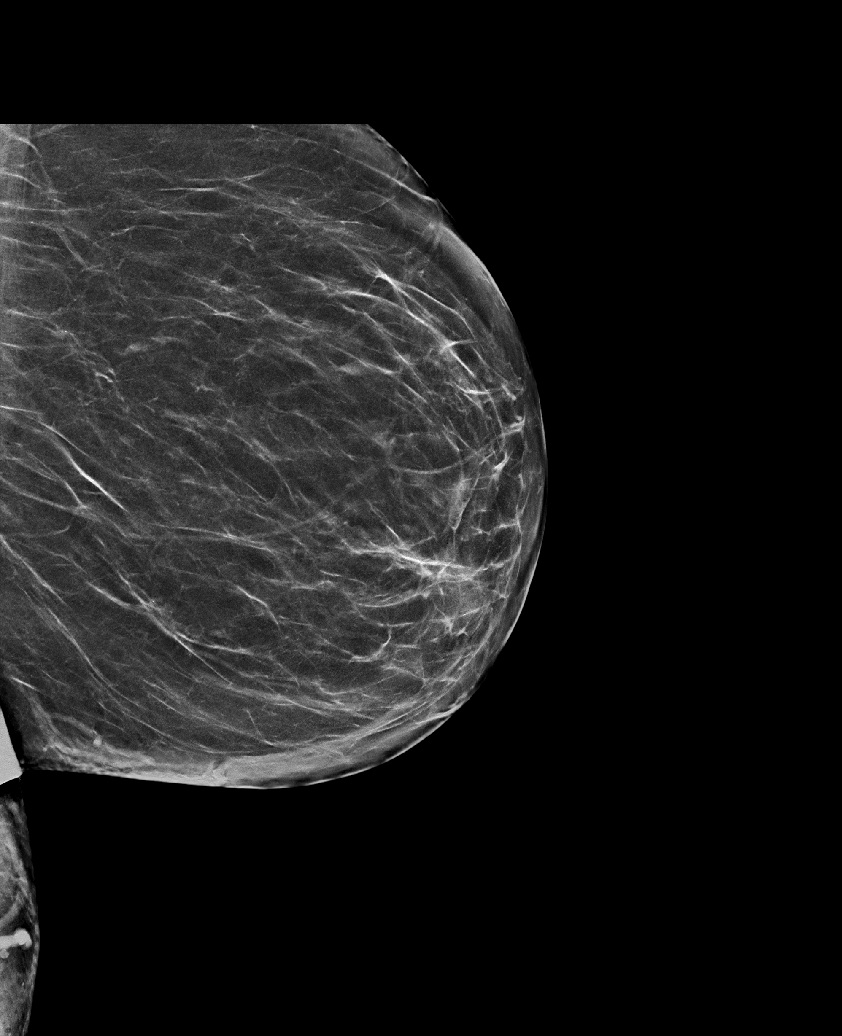

[R CC tomo · tomo slice 31/61.0]
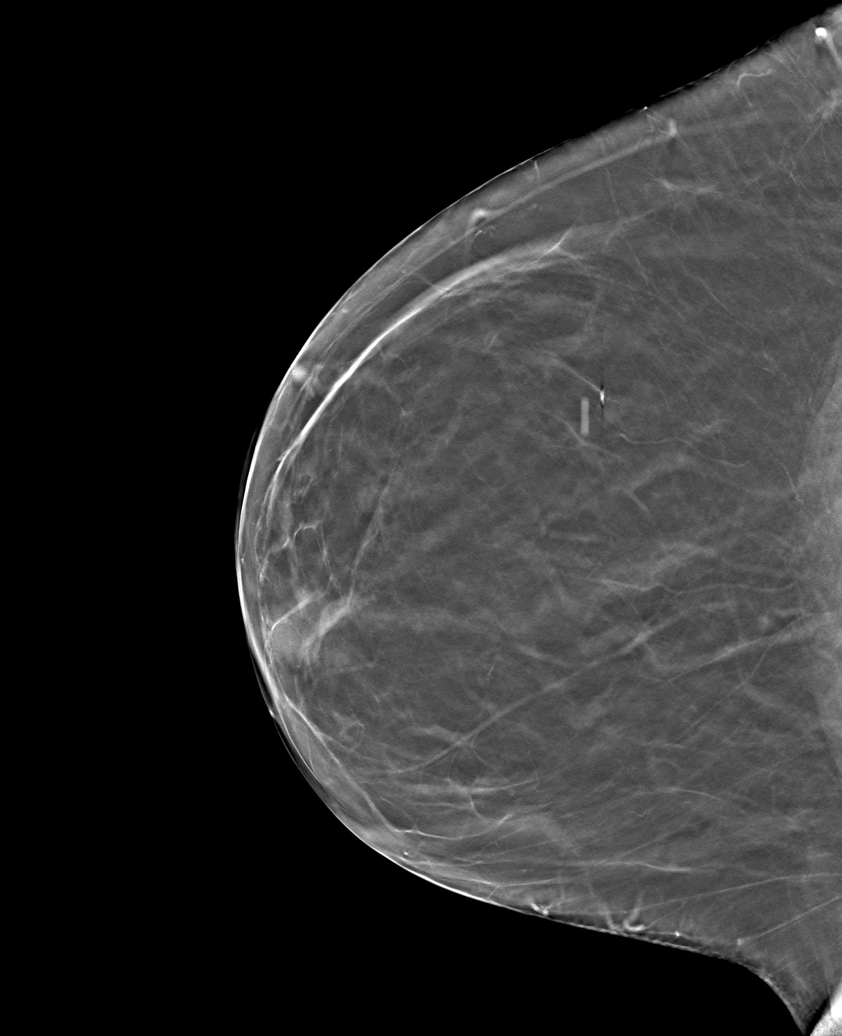

[6 of 30 positions shown; findings below may reference images not displayed]

ACR Breast Density Category b: There are scattered areas of
fibroglandular density.
FINDINGS: No mass, architectural distortion, or suspicious microcalcification
is identified to suggest malignancy in either breast. Spot
tangential view of the outer right breast with tomography shows no
suspicious findings.

Mammographic images were processed with CAD.
IMPRESSION: No evidence of malignancy in either breast.

RECOMMENDATION:
Screening mammogram in one year.(Code:[id number]-R-RK5)

I have discussed the findings and recommendations with the patient.
Results were also provided in writing at the conclusion of the
visit. If applicable, a reminder letter will be sent to the patient
regarding the next appointment.

BI-RADS CATEGORY  1: Negative.

## 2019-08-21 ENCOUNTER — Other Ambulatory Visit (INDEPENDENT_AMBULATORY_CARE_PROVIDER_SITE_OTHER): Payer: Self-pay | Admitting: Internal Medicine

## 2019-09-06 ENCOUNTER — Other Ambulatory Visit: Payer: Self-pay

## 2019-09-06 ENCOUNTER — Ambulatory Visit (INDEPENDENT_AMBULATORY_CARE_PROVIDER_SITE_OTHER): Payer: BC Managed Care – PPO | Admitting: Orthopedic Surgery

## 2019-09-06 VITALS — BP 126/88 | HR 77 | Temp 97.9°F | Wt 264.0 lb

## 2019-09-06 DIAGNOSIS — G8929 Other chronic pain: Secondary | ICD-10-CM

## 2019-09-06 DIAGNOSIS — M25562 Pain in left knee: Secondary | ICD-10-CM | POA: Diagnosis not present

## 2019-09-06 DIAGNOSIS — Z6841 Body Mass Index (BMI) 40.0 and over, adult: Secondary | ICD-10-CM

## 2019-09-06 NOTE — Patient Instructions (Signed)
You have received an injection of steroids into the joint. 15% of patients will have increased pain within the 24 hours postinjection.   This is transient and will go away.   We recommend that you use ice packs on the injection site for 20 minutes every 2 hours and extra strength Tylenol 2 tablets every 8 as needed until the pain resolves.  If you continue to have pain after taking the Tylenol and using the ice please call the office for further instructions.  Maintain a good weight

## 2019-09-06 NOTE — Progress Notes (Signed)
The patient meets the AMA guidelines for Morbid (severe) obesity with a BMI > 40.0 and I have recommended weight loss.  BP 126/88   Pulse 77   Temp 97.9 F (36.6 C)   Wt 264 lb (119.7 kg)   BMI 47.52 kg/m   Encounter Diagnoses  Name Primary?  . Chronic pain of left knee Yes  . Body mass index 45.0-49.9, adult ([place])   . Morbid obesity [facility])     Chief Complaint  Patient presents with  . Follow-up    Recheck on left knee.    Procedure note left knee injection   verbal consent was obtained to inject left knee joint  Timeout was completed to confirm the site of injection  The medications used were 40 mg of Depo-Medrol and 1% lidocaine 3 cc  Anesthesia was provided by ethyl chloride and the skin was prepped with alcohol.  After cleaning the skin with alcohol a 20-gauge needle was used to inject the left knee joint. There were no complications. A sterile bandage was applied.   fu 1 month

## 2019-09-08 ENCOUNTER — Other Ambulatory Visit (HOSPITAL_COMMUNITY): Payer: Self-pay | Admitting: Family Medicine

## 2019-09-08 DIAGNOSIS — Z1231 Encounter for screening mammogram for malignant neoplasm of breast: Secondary | ICD-10-CM

## 2019-09-16 ENCOUNTER — Ambulatory Visit (HOSPITAL_COMMUNITY): Payer: Medicare Other

## 2019-09-21 ENCOUNTER — Other Ambulatory Visit (INDEPENDENT_AMBULATORY_CARE_PROVIDER_SITE_OTHER): Payer: Self-pay | Admitting: Gastroenterology

## 2019-09-22 ENCOUNTER — Other Ambulatory Visit: Payer: Self-pay

## 2019-09-22 ENCOUNTER — Ambulatory Visit (HOSPITAL_COMMUNITY)
Admission: RE | Admit: 2019-09-22 | Discharge: 2019-09-22 | Disposition: A | Discharge status: Home / Self Care | Payer: BC Managed Care – PPO | Source: Ambulatory Visit | Attending: Family Medicine | Admitting: Family Medicine

## 2019-09-22 DIAGNOSIS — Z1231 Encounter for screening mammogram for malignant neoplasm of breast: Secondary | ICD-10-CM | POA: Diagnosis not present

## 2019-10-06 ENCOUNTER — Ambulatory Visit (INDEPENDENT_AMBULATORY_CARE_PROVIDER_SITE_OTHER): Payer: BC Managed Care – PPO | Admitting: Orthopedic Surgery

## 2019-10-06 ENCOUNTER — Encounter: Payer: Self-pay | Admitting: Orthopedic Surgery

## 2019-10-06 ENCOUNTER — Other Ambulatory Visit: Payer: Self-pay

## 2019-10-06 VITALS — BP 159/76 | HR 82 | Ht 62.5 in | Wt 264.0 lb

## 2019-10-06 DIAGNOSIS — M25462 Effusion, left knee: Secondary | ICD-10-CM

## 2019-10-06 DIAGNOSIS — Z6841 Body Mass Index (BMI) 40.0 and over, adult: Secondary | ICD-10-CM

## 2019-10-06 MED ORDER — DICLOFENAC SODIUM 75 MG PO TBEC: 75.0000 mg | DELAYED_RELEASE_TABLET | Freq: Two times a day (BID) | ORAL | 5 refills | Status: DC | PRN

## 2019-10-06 NOTE — Progress Notes (Addendum)
Chief Complaint  Patient presents with  . Knee Pain    left/ getting worse wants to discuss surgery    And is having increasing knee pain was wanting to discuss knee replacement surgery  The patient has been advised to have a thoracic to lumbar fusion but is not wanting to proceed with this because of its inherent risks  She was considering total knee replacement surgery  But her BMI is high 40s I believe.  Body mass index is 47.52 kg/m. BP (!) 159/76   Pulse 82   Ht 5' 2.5" (1.588 m)   Wt 264 lb (119.7 kg)   BMI 47.52 kg/m    Her left knee is swollen and tender she has decreased range of motion it feels stable skin is intact neurovascular exam is pulse and perfusion color and capillary refill with altered sensation from prior lumbar surgeries  Aspiration injection left knee  Discussed with patient that she is not a candidate for knee replacement surgery because of her back condition and because of her weight  Procedure note injection and aspiration left knee joint  Verbal consent was obtained to aspirate and inject the left knee joint   Timeout was completed to confirm the site of aspiration and injection  An 18-gauge needle was used to aspirate the left knee joint from a suprapatellar lateral approach.  The medications used were 40 mg of Depo-Medrol and 1% lidocaine 3 cc  Anesthesia was provided by ethyl chloride and the skin was prepped with alcohol.  After cleaning the skin with alcohol an 18-gauge needle was used to aspirate the right knee joint.  We obtained 10 cc of fluid clear  We followed this by injection of 40 mg of Depo-Medrol and 3 cc 1% lidocaine.  There were no complications. A sterile bandage was applied.  Encounter Diagnoses  Name Primary?  . Effusion of left knee   . Body mass index 45.0-49.9, adult ([place]) Yes  . Morbid obesity ([place])       Meds ordered this encounter  Medications  . diclofenac (VOLTAREN) 75 MG EC tablet    Sig: Take 1  tablet (75 mg total) by mouth 2 (two) times daily as needed. TAKE (1) TABLET BY MOUTH TWICE DAILY WITH MEALS.    Dispense:  60 tablet    Refill:  5    Chronic problem with exacerbation, minimal data moderate risk from prescription management

## 2019-10-18 ENCOUNTER — Other Ambulatory Visit (INDEPENDENT_AMBULATORY_CARE_PROVIDER_SITE_OTHER): Payer: Self-pay | Admitting: Internal Medicine

## 2019-11-02 ENCOUNTER — Ambulatory Visit (INDEPENDENT_AMBULATORY_CARE_PROVIDER_SITE_OTHER): Payer: BC Managed Care – PPO | Admitting: Internal Medicine

## 2019-11-03 ENCOUNTER — Encounter: Payer: Self-pay | Admitting: Orthopedic Surgery

## 2019-11-03 ENCOUNTER — Ambulatory Visit (INDEPENDENT_AMBULATORY_CARE_PROVIDER_SITE_OTHER): Payer: Medicare Other | Admitting: Orthopedic Surgery

## 2019-11-03 ENCOUNTER — Other Ambulatory Visit: Payer: Self-pay

## 2019-11-03 VITALS — Temp 97.2°F | Ht 62.5 in | Wt 264.0 lb

## 2019-11-03 DIAGNOSIS — M25562 Pain in left knee: Secondary | ICD-10-CM | POA: Diagnosis not present

## 2019-11-03 DIAGNOSIS — M1711 Unilateral primary osteoarthritis, right knee: Secondary | ICD-10-CM

## 2019-11-03 DIAGNOSIS — Z6841 Body Mass Index (BMI) 40.0 and over, adult: Secondary | ICD-10-CM

## 2019-11-03 DIAGNOSIS — G8929 Other chronic pain: Secondary | ICD-10-CM

## 2019-11-03 DIAGNOSIS — M25462 Effusion, left knee: Secondary | ICD-10-CM | POA: Diagnosis not present

## 2019-11-03 NOTE — Progress Notes (Signed)
Chief Complaint  Patient presents with  . Knee Pain    Recheck on left knee    Encounter Diagnoses  Name Primary?  . Effusion of left knee   . Body mass index 45.0-49.9, adult ([place])   . Morbid obesity ([place])   . Chronic pain of left knee Yes  . Primary osteoarthritis of right knee     Chronic left knee pain chronic left knee effusions chronic left knee injections monthly.  Patient's weight and BMI chronic back problems make her a poor candidate for replacement surgery  SHE WANTS INJECTIONS BOTH KNEES  THE LEFT HAS EFFUSION WITH DIFFUSE TENDERNESS  THE RIGHT HAS JOINT LINE TENDERNESS   Procedure note right knee injection   verbal consent was obtained to inject right knee joint  Timeout was completed to confirm the site of injection  The medications used were 40 mg of Depo-Medrol and 1% lidocaine 3 cc  Anesthesia was provided by ethyl chloride and the skin was prepped with alcohol.  After cleaning the skin with alcohol a 20-gauge needle was used to inject the right knee joint. There were no complications. A sterile bandage was applied.   Procedure note injection and aspiration left knee joint  Verbal consent was obtained to aspirate and inject the left knee joint   Timeout was completed to confirm the site of aspiration and injection  An 18-gauge needle was used to aspirate the left knee joint from a suprapatellar lateral approach.  The medications used were 40 mg of Depo-Medrol and 1% lidocaine 3 cc  Anesthesia was provided by ethyl chloride and the skin was prepped with alcohol.  After cleaning the skin with alcohol an 18-gauge needle was used to aspirate the right knee joint.  We obtained 18 cc of fluid CLEAR YELLOW  We followed this by injection of 40 mg of Depo-Medrol and 3 cc 1% lidocaine.  There were no complications. A sterile bandage was applied.  Encounter Diagnoses  Name Primary?  . Effusion of left knee   . Body mass index 45.0-49.9, adult ([place])    . Morbid obesity ([place])   . Chronic pain of left knee Yes  . Primary osteoarthritis of right knee

## 2019-12-01 ENCOUNTER — Ambulatory Visit: Payer: Medicare Other | Admitting: Orthopedic Surgery

## 2019-12-02 ENCOUNTER — Encounter: Payer: Self-pay | Admitting: Orthopedic Surgery

## 2019-12-02 ENCOUNTER — Ambulatory Visit (INDEPENDENT_AMBULATORY_CARE_PROVIDER_SITE_OTHER): Payer: Medicare Other | Admitting: Orthopedic Surgery

## 2019-12-02 ENCOUNTER — Other Ambulatory Visit: Payer: Self-pay

## 2019-12-02 VITALS — BP 200/110 | HR 87 | Ht 62.5 in | Wt 272.0 lb

## 2019-12-02 DIAGNOSIS — G8929 Other chronic pain: Secondary | ICD-10-CM

## 2019-12-02 DIAGNOSIS — Z6841 Body Mass Index (BMI) 40.0 and over, adult: Secondary | ICD-10-CM

## 2019-12-02 DIAGNOSIS — M25462 Effusion, left knee: Secondary | ICD-10-CM | POA: Diagnosis not present

## 2019-12-02 DIAGNOSIS — M25562 Pain in left knee: Secondary | ICD-10-CM

## 2019-12-02 DIAGNOSIS — M1711 Unilateral primary osteoarthritis, right knee: Secondary | ICD-10-CM

## 2019-12-02 NOTE — Patient Instructions (Signed)

## 2019-12-02 NOTE — Progress Notes (Signed)
Chief Complaint  Patient presents with  . Knee Pain    left    Encounter Diagnoses  Name Primary?  . Effusion of left knee Yes  . Body mass index 45.0-49.9, adult ([place])   . Morbid obesity ([place])   . Chronic pain of left knee   . Primary osteoarthritis of right knee    28 cc aspiration   Procedure note injection and aspiration left knee joint  Verbal consent was obtained to aspirate and inject the left knee joint   Timeout was completed to confirm the site of aspiration and injection  An 18-gauge needle was used to aspirate the left knee joint from a suprapatellar lateral approach.  The medications used were 40 mg of Depo-Medrol and 1% lidocaine 3 cc  Anesthesia was provided by ethyl chloride and the skin was prepped with alcohol.  After cleaning the skin with alcohol an 18-gauge needle was used to aspirate the right knee joint.  We obtained 28 cc of fluid clear   We followed this by injection of 40 mg of Depo-Medrol and 3 cc 1% lidocaine.  There were no complications. A sterile bandage was applied.  Encounter Diagnoses  Name Primary?  . Effusion of left knee Yes  . Body mass index 45.0-49.9, adult ([place])   . Morbid obesity ([place])   . Chronic pain of left knee   . Primary osteoarthritis of right knee     Fu 1 month

## 2019-12-30 ENCOUNTER — Ambulatory Visit (INDEPENDENT_AMBULATORY_CARE_PROVIDER_SITE_OTHER): Payer: Medicare Other | Admitting: Orthopedic Surgery

## 2019-12-30 ENCOUNTER — Other Ambulatory Visit: Payer: Self-pay

## 2019-12-30 VITALS — Ht 62.5 in | Wt 272.0 lb

## 2019-12-30 DIAGNOSIS — M25462 Effusion, left knee: Secondary | ICD-10-CM

## 2019-12-30 DIAGNOSIS — M79672 Pain in left foot: Secondary | ICD-10-CM

## 2019-12-30 DIAGNOSIS — M21611 Bunion of right foot: Secondary | ICD-10-CM

## 2019-12-30 DIAGNOSIS — Z6841 Body Mass Index (BMI) 40.0 and over, adult: Secondary | ICD-10-CM

## 2019-12-30 NOTE — Progress Notes (Signed)
Chief Complaint  Patient presents with  . Follow-up    Recheck on left knee.    Procedure note injection and aspiration left knee joint  Verbal consent was obtained to aspirate and inject the left knee joint   Timeout was completed to confirm the site of aspiration and injection  An 18-gauge needle was used to aspirate the left knee joint from a suprapatellar lateral approach.  The medications used were 40 mg of Depo-Medrol and 1% lidocaine 3 cc  Anesthesia was provided by ethyl chloride and the skin was prepped with alcohol.  After cleaning the skin with alcohol an 18-gauge needle was used to aspirate the right knee joint.  We obtained 12 cc of fluid clear yellow  We followed this by injection of 40 mg of Depo-Medrol and 3 cc 1% lidocaine.  There were no complications. A sterile bandage was applied.   Encounter Diagnoses  Name Primary?  . Effusion of left knee Yes  . Body mass index 45.0-49.9, adult ([place])   . Morbid obesity ([place])     F/u 1 month

## 2019-12-30 NOTE — Patient Instructions (Signed)

## 2019-12-30 NOTE — Addendum Note (Signed)
Addended byCandice Camp on: 12/30/2019 02:31 PM   Modules accepted: Orders

## 2020-01-06 ENCOUNTER — Ambulatory Visit (INDEPENDENT_AMBULATORY_CARE_PROVIDER_SITE_OTHER): Payer: Medicare Other | Admitting: Orthopedic Surgery

## 2020-01-06 ENCOUNTER — Encounter: Payer: Self-pay | Admitting: Orthopedic Surgery

## 2020-01-06 ENCOUNTER — Ambulatory Visit: Payer: Self-pay

## 2020-01-06 ENCOUNTER — Other Ambulatory Visit: Payer: Self-pay

## 2020-01-06 VITALS — Ht 62.5 in | Wt 272.0 lb

## 2020-01-06 DIAGNOSIS — M79671 Pain in right foot: Secondary | ICD-10-CM

## 2020-01-06 DIAGNOSIS — M76822 Posterior tibial tendinitis, left leg: Secondary | ICD-10-CM

## 2020-01-06 DIAGNOSIS — M21611 Bunion of right foot: Secondary | ICD-10-CM | POA: Diagnosis not present

## 2020-01-06 DIAGNOSIS — M79672 Pain in left foot: Secondary | ICD-10-CM

## 2020-01-06 NOTE — Progress Notes (Addendum)
Office Visit Note   Patient: [name]           Date of Birth: [birth year]           MRN: [id number] Visit Date: 01/06/2020              Requested by: Carole Civil, MD [address] [place],  [place] [address] PCP: Lucia Gaskins, MD  Chief Complaint  Patient presents with  . Left Foot - Pain, New Patient (Initial Visit)  . Right Foot - Pain      HPI: Patient is a 54 year old woman who is status post bunion surgery for the right great toe.  Patient presents at this time with 3 separate issues.  #1 patient is having pain with pronation and valgus of the forefoot with posterior tibial tendon insufficiency.  She is having pain across the fusion site of the base of the first metatarsal with what appears to be a fibrous union and she is having pain with bunion deformity of her right foot.  Patient states she is on Chantix for smoking but she states she is still smoking a little.  She states she is recently fallen sustaining bruising to her face and left leg.  Patient states that she does have serial aspirations and injections with Dr. Aline Brochure for her arthritis in her left knee.  She states she is also status post lumbar spine surgery x5 and still has back pain.  Assessment & Plan: Visit Diagnoses:  1. Pain in both feet   2. Bunion, right foot   3. Posterior tibial tendinitis, left leg     Plan: Patient states that her worst pain is in the right foot.  I recommended proceeding with a chevron osteotomy on the right.  Plan for outpatient surgery.  Patient states that her daughter has an open house coming up in July and we will plan for surgery after the open house most likely in August.  Discussed the importance of smoking cessation for proper bone healing.  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  Discussed that since this is symptomatic we could proceed with revision fusion with removal of the hardware removal of the  fibrous union and revision internal fixation with plate and screws.  Discussed that for her posterior tibial tendon insufficiency on the left recommended sole orthotics and a stiff soled sneaker to unload pressure from the posterior tibial tendon.  Follow-Up Instructions: No follow-ups on file.   Ortho Exam  Patient is alert, oriented, no adenopathy, well-dressed, normal affect, normal respiratory effort. Examination patient has a palpable pulse bilaterally there is some bruising on her face and bruising on her left leg from a recent fall.  Patient does have a pronated valgus forefoot on the left and cannot do a single limb heel raise on the left she has a good arch on the right and cannot do a single limb heel raise on the right.  She has pain to palpation of the base of the first and second metatarsals on the left secondary to a fibrous union.  Her toe is straight with good realignment from her bunion surgery.  Semination the right foot she has a prominence medially that is tender to palpation she has no pain with range of motion of the MTP joint on the right foot.  There is no overlapping of the great toe and second toe she has good range of motion of the ankles  and subtalar joint.  Imaging: XR Foot 2 Views Left  Result Date: 01/06/2020 2 view radiographs of the left foot bunion surgery with realignment from the base of the first metatarsal with distal ostectomy.  Patient has had some loosening of the screws and appears to have a fibrous union at the fusion site.  XR Foot 2 Views Right  Result Date: 01/06/2020 2 view radiographs of the right foot shows a moderate bunion deformity of the right great toe with a congruent MTP joint.  No images are attached to the encounter.  Labs: Lab Results  Component Value Date   ESRSEDRATE 6 04/11/2015     Lab Results  Component Value Date   ALBUMIN 3.8 08/18/2017   ALBUMIN 3.8 07/30/2016   ALBUMIN 4.6 04/11/2015    No results found for: MG No  results found for: VD25OH  No results found for: PREALBUMIN CBC EXTENDED Latest Ref Rng & Units 08/22/2017 08/18/2017 11/12/2016  WBC 4.0 - 10.5 K/uL 11.7(H) 10.3 6.8  RBC 3.87 - 5.11 MIL/uL 3.57(L) 4.63 4.26  HGB 12.0 - 15.0 g/dL 10.9(L) 14.4 12.7  HCT 36 - 46 % 34.3(L) 43.7 39.8  PLT 150 - 400 K/uL 276 349 356  NEUTROABS 1.7 - 7.7 K/uL - - -  LYMPHSABS 0.7 - 4.0 K/uL - - -     Body mass index is 48.96 kg/m.  Orders:  Orders Placed This Encounter  Procedures  . XR Foot 2 Views Left  . XR Foot 2 Views Right   No orders of the defined types were placed in this encounter.    Procedures: No procedures performed  Clinical Data: No additional findings.  ROS:  All other systems negative, except as noted in the HPI. Review of Systems  Objective: Vital Signs: Ht 5' 2.5" (1.588 m)   Wt 272 lb (123.4 kg)   BMI 48.96 kg/m   Specialty Comments:  No specialty comments available.  PMFS History: Patient Active Problem List   Diagnosis Date Noted  . Dry skin 08/10/2018  . Decreased libido without sexual dysfunction 08/10/2018  . Depression 08/10/2018  . Screening for colorectal cancer 08/10/2018  . Routine cervical smear 08/10/2018  . Encounter for gynecological examination with Papanicolaou smear of cervix 08/10/2018  . Screening examination for STD (sexually transmitted disease) 08/10/2018  . Acquired spondylolisthesis of lumbosacral region 08/21/2017  . S/P left knee arthroscopy 12/25/16 07/03/2017  . Derangement of posterior horn of medial meniscus of left knee   . Lateral meniscus, anterior horn derangement, left   . Chondromalacia of patellofemoral joint, left   . Lumbar adjacent segment disease with spondylolisthesis 08/08/2016  . RLQ abdominal pain 04/11/2015  . Hot flashes 04/11/2015  . Short-segment Barrett's esophagus 02/28/2015  . Peripheral arterial disease ([place]) 12/13/2013  . Status post rotator cuff repair 04/15/2013  . Rotator cuff tear 11/26/2012  .  Rotator cuff syndrome of right shoulder 10/27/2012  . Right shoulder pain 10/27/2012  . HELICOBACTER PYLORI [place] INFECTION 11/03/2009  . DEPRESSION 11/02/2009  . RESTLESS LEG SYNDROME 11/02/2009  . GASTROESOPHAGEAL REFLUX DISEASE, CHRONIC 11/02/2009  . Chronic constipation 11/02/2009  . IRRITABLE BOWEL SYNDROME 11/02/2009  . ARTHRITIS 11/02/2009  . BACK PAIN, CHRONIC 11/02/2009  . NAUSEA 11/02/2009  . DIARRHEA 11/02/2009  . ABDOMINAL PAIN 11/02/2009  . RECTAL BLEEDING, HX OF 11/02/2009   Past Medical History:  Diagnosis Date  . ADHD (attention deficit hyperactivity disorder)   . Anxiety   . Arthritis    disk disease  .  Barrett esophagus   . Chronic pain   . COPD (chronic obstructive pulmonary disease) ([place])   . Depression   . Dyspnea   . Fibromyalgia   . GERD (gastroesophageal reflux disease)    IBS  . Headache   . Hot flashes 04/11/2015  . IBS (irritable bowel syndrome)   . Obesity   . Peripheral arterial disease ([place])   . Plantar fasciitis of left foot   . PTSD (post-traumatic stress disorder)   . RLQ abdominal pain 04/11/2015  . Vocal cord cancer [facility])    surgery corrected the problem     Family History  Problem Relation Age of Onset  . Diabetes Mother   . Hypertension Mother   . Heart disease Maternal Grandfather        sudden death  . Cancer Paternal Grandmother   . Cancer Paternal Grandfather   . Other Daughter        allergies; scolosis  . Cancer Paternal Uncle   . Cancer Paternal Uncle   . Cancer Paternal Uncle   . Cancer Paternal Uncle     Past Surgical History:  Procedure Laterality Date  . ANTERIOR CERVICAL DECOMP/DISCECTOMY FUSION  08/18/2012   Procedure: ANTERIOR CERVICAL DECOMPRESSION/DISCECTOMY FUSION 2 LEVELS;  Surgeon: Faythe Ghee, MD;  Location: [place] [facility];  Service: Neurosurgery;  Laterality: N/A;  anterior cervical five-six,six-seven decompression fusion plating  . ANTERIOR LAT LUMBAR FUSION  03/19/2012   Procedure: ANTERIOR  LATERAL LUMBAR FUSION 1 LEVEL;  Surgeon: Faythe Ghee, MD;  Location: [place] [facility];  Service: Neurosurgery;  Laterality: Right;  Anterolateral Lumbar Interbody Fusion, Lumbar Three-Four (Hand broke through glove and nat in the room)  . BACK SURGERY    . BUNIONECTOMY Left 08/04/2014   Procedure: LEFT FOOT LAPIDUS BUNION CORRECTION, LEFT SECOND TARSALMETATARSAL JOINT DEBRIDEMENT AND LEFT MODIFIED MCBRIDE BUNIONECTOMY;  Surgeon: [name], MD;  Location: [place] [facility];  Service: Orthopedics;  Laterality: Left;  . CARPAL TUNNEL RELEASE Bilateral   . CHONDROPLASTY Left 12/25/2016   Procedure: CHONDROPLASTY LEFT PATELLA;  Surgeon: Carole Civil, MD;  [facility];  Service: Orthopedics;  Laterality: Left;  . CLEFT PALATE REPAIR    . COLONOSCOPY N/A 02/16/2015   Procedure: COLONOSCOPY;  Surgeon: Rogene Houston, MD;  [facility];  Service: Endoscopy;  Laterality: N/A;  155  . ENDOMETRIAL ABLATION    . ESOPHAGOGASTRODUODENOSCOPY N/A 02/16/2015   Procedure: ESOPHAGOGASTRODUODENOSCOPY (EGD);  Surgeon: Rogene Houston, MD;  [facility];  Service: Endoscopy;  Laterality: N/A;  . KNEE ARTHROSCOPY WITH MEDIAL MENISECTOMY Left 12/25/2016   Procedure: KNEE ARTHROSCOPY WITH MEDIAL AND LATERAL MENISECTOMY;  Surgeon: Carole Civil, MD;  Location: [facility];  Service: Orthopedics;  Laterality: Left;  . LAPAROSCOPIC TUBAL LIGATION     [facility]  . MAXIMUM ACCESS (MAS)POSTERIOR LUMBAR INTERBODY FUSION (PLIF) 2 LEVEL  08/08/2016  . NOSE SURGERY    . SHOULDER OPEN ROTATOR CUFF REPAIR Right 04/02/2013   Procedure: ROTATOR CUFF REPAIR SHOULDER OPEN;  Surgeon: Carole Civil, MD;  [facility];  Service: Orthopedics;  Laterality: Right;  . THROAT SURGERY  2000   for throat cancer/no problems intubation...[facility]  . TONSILLECTOMY    . TUBAL LIGATION     Social History   Occupational History  . Not on file  Tobacco Use  . Smoking status: Current  Some Day Smoker    Packs/day: 0.50    Years: 30.00    Pack years: 15.00  Types: Cigarettes  . Smokeless tobacco: Never Used  Vaping Use  . Vaping Use: Never used  Substance and Sexual Activity  . Alcohol use: Yes    Comment: 08/08/2016 "few times/year"  . Drug use: Not Currently    Types: Marijuana    Comment: 08/08/2016 "maybe once/week; nothing consistent"  . Sexual activity: Yes    Birth control/protection: Surgical    Comment: tubal

## 2020-01-24 ENCOUNTER — Other Ambulatory Visit (INDEPENDENT_AMBULATORY_CARE_PROVIDER_SITE_OTHER): Payer: Self-pay | Admitting: Internal Medicine

## 2020-01-25 ENCOUNTER — Other Ambulatory Visit: Payer: Self-pay

## 2020-01-27 ENCOUNTER — Other Ambulatory Visit: Payer: Self-pay

## 2020-01-27 ENCOUNTER — Encounter: Payer: Self-pay | Admitting: Orthopedic Surgery

## 2020-01-27 ENCOUNTER — Ambulatory Visit (INDEPENDENT_AMBULATORY_CARE_PROVIDER_SITE_OTHER): Payer: Medicare Other | Admitting: Orthopedic Surgery

## 2020-01-27 ENCOUNTER — Telehealth: Payer: Self-pay | Admitting: Orthopedic Surgery

## 2020-01-27 ENCOUNTER — Ambulatory Visit: Payer: Medicare Other

## 2020-01-27 VITALS — BP 173/101 | HR 90 | Ht 62.5 in | Wt 272.0 lb

## 2020-01-27 DIAGNOSIS — Z6841 Body Mass Index (BMI) 40.0 and over, adult: Secondary | ICD-10-CM

## 2020-01-27 DIAGNOSIS — G8929 Other chronic pain: Secondary | ICD-10-CM

## 2020-01-27 DIAGNOSIS — M25562 Pain in left knee: Secondary | ICD-10-CM

## 2020-01-27 NOTE — Telephone Encounter (Signed)
20cc I called her to advise.

## 2020-01-27 NOTE — Progress Notes (Signed)
June 5 fell At home   Chief Complaint  Patient presents with  . Knee Pain    fall increased left knee pain on 01/01/20 wants xray    Acute pain left knee   BP (!) 173/101   Pulse 90   Ht 5' 2.5" (1.588 m)   Wt 272 lb (123.4 kg)   BMI 48.96 kg/m   The patient meets the AMA guidelines for Morbid (severe) obesity with a BMI > 40.0 and I have recommended weight loss.  Physical Exam Vitals and nursing note reviewed.  Constitutional:      Appearance: Normal appearance. She is obese.  Musculoskeletal:     Comments: Left knee is swollen quite a bit today does not come to full extension tenderness in the lateral compartment large joint effusion decreased flexion only goes to about 90 but feels stable    Neurological:     Mental Status: She is alert.    Encounter Diagnoses  Name Primary?  . Chronic pain of left knee   . Acute pain of left knee Yes  . Body mass index 45.0-49.9, adult ([place])   . Morbid obesity ([place])      Procedure note injection and aspiration left knee joint  Verbal consent was obtained to aspirate and inject the left knee joint   Timeout was completed to confirm the site of aspiration and injection  An 18-gauge needle was used to aspirate the left knee joint from a suprapatellar lateral approach.  The medications used were 40 mg of Depo-Medrol and 1% lidocaine 3 cc  Anesthesia was provided by ethyl chloride and the skin was prepped with alcohol.  After cleaning the skin with alcohol an 18-gauge needle was used to aspirate the right knee joint.  We obtained 20cc of fluid clear   We followed this by injection of 40 mg of Depo-Medrol and 3 cc 1% lidocaine.  There were no complications. A sterile bandage was applied.  Encounter Diagnoses  Name Primary?  . Chronic pain of left knee   . Acute pain of left knee Yes  . Body mass index 45.0-49.9, adult ([place])   . Morbid obesity ([facility])    4-week follow-up

## 2020-01-27 NOTE — Patient Instructions (Signed)

## 2020-01-27 NOTE — Telephone Encounter (Signed)
Pt would like to know how many CC's were pulled off of her knee today.

## 2020-02-01 ENCOUNTER — Telehealth: Payer: Self-pay | Admitting: Orthopedic Surgery

## 2020-02-01 NOTE — Telephone Encounter (Signed)
Call received via voice message from patient's insurer, [employer], per representative Nicole Kindred - requests return call regarding insurance claim question at ph# [phone], ext [phone].

## 2020-02-02 NOTE — Telephone Encounter (Signed)
Spoke with our supervisor Abigail Butts, who verified that insurer needs to contact [facility] billing directly for insurance-related inquiries (visit filing order, etc). Michela Pitcher will reach out to them.

## 2020-02-16 ENCOUNTER — Other Ambulatory Visit: Payer: Self-pay | Admitting: Physician Assistant

## 2020-02-17 ENCOUNTER — Encounter (INDEPENDENT_AMBULATORY_CARE_PROVIDER_SITE_OTHER): Payer: Self-pay | Admitting: *Deleted

## 2020-02-23 ENCOUNTER — Other Ambulatory Visit (HOSPITAL_COMMUNITY): Payer: Medicare Other

## 2020-02-24 ENCOUNTER — Ambulatory Visit (INDEPENDENT_AMBULATORY_CARE_PROVIDER_SITE_OTHER): Payer: Medicare Other | Admitting: Orthopedic Surgery

## 2020-02-24 ENCOUNTER — Other Ambulatory Visit: Payer: Self-pay

## 2020-02-24 VITALS — Ht 62.5 in | Wt 275.0 lb

## 2020-02-24 DIAGNOSIS — M25562 Pain in left knee: Secondary | ICD-10-CM | POA: Diagnosis not present

## 2020-02-24 DIAGNOSIS — Z6841 Body Mass Index (BMI) 40.0 and over, adult: Secondary | ICD-10-CM

## 2020-02-24 DIAGNOSIS — M25462 Effusion, left knee: Secondary | ICD-10-CM

## 2020-02-24 DIAGNOSIS — G8929 Other chronic pain: Secondary | ICD-10-CM

## 2020-02-24 NOTE — Progress Notes (Signed)
Chief Complaint  Patient presents with  . Follow-up    4week recheck on left knee.   H/O CHRONIC KNEE PAIN WITH EFFUSION COMES IN FOR ASPIRATIONS DUE TO BEING POOR CANDIDATE FOR TKA   Left knee eval Seems to have an effusion Tenderness lateral compartment Mild valgus knee Decreased range of motion with 80 degrees active flexion Hyperextends Cane for ambulation  Encounter Diagnoses  Name Primary?  . Chronic pain of left knee   . Body mass index 45.0-49.9, adult ([place])   . Morbid obesity ([place])   . Effusion of left knee Yes    The patient meets the AMA guidelines for Morbid (severe) obesity with a BMI > 40.0 and I have recommended weight loss.  I tried to aspirate this knee today and only got back to see the fluid but it is always difficult to get the fluid out she has a lot of pain decreased range of motion hopefully MRI will help [place] evaluate this knee a little bit better we know she has arthritis but she has had recurrent effusions for over a year  MRI follow-up by phone call  Keep regular appointment for 4 weeks  Current medicines Robaxin oxycodone 10 Voltaren 75 gabapentin 300 mg 3 times a day related to the chronic back situation     has a past medical history of ADHD (attention deficit hyperactivity disorder), Anxiety, Arthritis, Barrett esophagus, Chronic pain, COPD (chronic obstructive pulmonary disease) ([place]), Depression, Dyspnea, Fibromyalgia, GERD (gastroesophageal reflux disease), Headache, Hot flashes (04/11/2015), IBS (irritable bowel syndrome), Obesity, Peripheral arterial disease ([place]), Plantar fasciitis of left foot, PTSD (post-traumatic stress disorder), RLQ abdominal pain (04/11/2015), and Vocal cord cancer ([place]).

## 2020-02-24 NOTE — Patient Instructions (Signed)
MRI SCHEDULED / WHEN I GET IT BACK I LL CALL YOU   OFFICE VISIT 4 WEEKS

## 2020-02-25 ENCOUNTER — Other Ambulatory Visit (HOSPITAL_COMMUNITY): Payer: Medicare Other

## 2020-02-29 ENCOUNTER — Encounter (HOSPITAL_BASED_OUTPATIENT_CLINIC_OR_DEPARTMENT_OTHER): Admission: RE | Payer: Self-pay | Source: Home / Self Care

## 2020-02-29 ENCOUNTER — Ambulatory Visit (HOSPITAL_BASED_OUTPATIENT_CLINIC_OR_DEPARTMENT_OTHER): Admission: RE | Admit: 2020-02-29 | Payer: Medicare Other | Source: Home / Self Care | Admitting: Orthopedic Surgery

## 2020-02-29 SURGERY — OSTEOTOMY, WEIL
Anesthesia: Choice | Laterality: Right

## 2020-03-15 ENCOUNTER — Ambulatory Visit (HOSPITAL_COMMUNITY): Payer: Medicare Other

## 2020-03-17 ENCOUNTER — Other Ambulatory Visit: Payer: Self-pay

## 2020-03-17 ENCOUNTER — Ambulatory Visit (HOSPITAL_COMMUNITY)
Admission: RE | Admit: 2020-03-17 | Discharge: 2020-03-17 | Disposition: A | Discharge status: Home / Self Care | Payer: Medicare Other | Source: Ambulatory Visit | Attending: Orthopedic Surgery | Admitting: Orthopedic Surgery

## 2020-03-17 DIAGNOSIS — M25462 Effusion, left knee: Secondary | ICD-10-CM | POA: Insufficient documentation

## 2020-03-17 DIAGNOSIS — M25562 Pain in left knee: Secondary | ICD-10-CM | POA: Diagnosis not present

## 2020-03-17 DIAGNOSIS — G8929 Other chronic pain: Secondary | ICD-10-CM | POA: Insufficient documentation

## 2020-03-21 ENCOUNTER — Other Ambulatory Visit (INDEPENDENT_AMBULATORY_CARE_PROVIDER_SITE_OTHER): Payer: Self-pay | Admitting: Gastroenterology

## 2020-03-22 ENCOUNTER — Telehealth (INDEPENDENT_AMBULATORY_CARE_PROVIDER_SITE_OTHER): Payer: Self-pay | Admitting: Gastroenterology

## 2020-03-22 NOTE — Telephone Encounter (Signed)
Please notify patient I sent refills of her medicines to pharmacy but she was last seen in July 2019 and will need an office visit for further refills.  Thanks.

## 2020-03-23 ENCOUNTER — Other Ambulatory Visit: Payer: Self-pay

## 2020-03-23 ENCOUNTER — Encounter: Payer: Self-pay | Admitting: Orthopedic Surgery

## 2020-03-23 ENCOUNTER — Ambulatory Visit (INDEPENDENT_AMBULATORY_CARE_PROVIDER_SITE_OTHER): Payer: Medicare Other | Admitting: Orthopedic Surgery

## 2020-03-23 VITALS — Ht 62.5 in | Wt 266.0 lb

## 2020-03-23 DIAGNOSIS — M7051 Other bursitis of knee, right knee: Secondary | ICD-10-CM | POA: Diagnosis not present

## 2020-03-23 DIAGNOSIS — M1712 Unilateral primary osteoarthritis, left knee: Secondary | ICD-10-CM

## 2020-03-23 DIAGNOSIS — M23301 Other meniscus derangements, unspecified lateral meniscus, left knee: Secondary | ICD-10-CM

## 2020-03-23 DIAGNOSIS — G8929 Other chronic pain: Secondary | ICD-10-CM

## 2020-03-23 DIAGNOSIS — Z6841 Body Mass Index (BMI) 40.0 and over, adult: Secondary | ICD-10-CM

## 2020-03-23 NOTE — Progress Notes (Addendum)
Encounter Diagnoses  Name Primary?  . Pes anserinus bursitis of right knee Yes  . Morbid obesity ([place])   . Body mass index 45.0-49.9, adult ([place])   . Chronic pain of left knee   . Primary osteoarthritis of left knee   . Lateral meniscus derangement, left    MRI follow-up left knee  Patient complains of persistent pain left knee lateral compartment also complains of pain just of the medial joint line right knee  I looked at her MRI and it shows that she has a torn lateral meniscus and high-grade cartilage loss in the lateral compartment with bone edema in the lateral compartment and lateral femur and tibia  We discussed this  Recommend left knee injection  Procedure note left knee injection   verbal consent was obtained to inject left knee joint  Timeout was completed to confirm the site of injection  The medications used were 40 mg of Depo-Medrol and 1% lidocaine 3 cc  Anesthesia was provided by ethyl chloride and the skin was prepped with alcohol.  After cleaning the skin with alcohol a 20-gauge needle was used to inject the left knee joint. There were no complications. A sterile bandage was applied.  Inject right knee pes bursa  Procedure note right knee injection for bursitis   verbal consent was obtained to inject right knee PES BURSA  Timeout was completed to confirm the site of injection  The medications used were 40 mg of Depo-Medrol and 1% lidocaine 3 cc  Anesthesia was provided by ethyl chloride and the skin was prepped with alcohol.  After cleaning the skin with alcohol a 25-gauge needle was used to inject the right knee bursa.  There were no complications and a sterile bandage was applied    Return in a month or before she goes to the beach for repeat injections  [place] is not a good surgical candidate at this time because of her severe spinal disease  We attempted a brace for her left knee to unload the lateral compartment however, her thigh calf  circumference mismatch precludes off-the-shelf bracing.  Patient's BMI is also on the high side Body mass index is 47.88 kg/m.

## 2020-04-12 ENCOUNTER — Telehealth: Payer: Self-pay | Admitting: Orthopedic Surgery

## 2020-04-12 DIAGNOSIS — M1712 Unilateral primary osteoarthritis, left knee: Secondary | ICD-10-CM

## 2020-04-12 NOTE — Telephone Encounter (Signed)
[name] called asking about a brace that she thought Dr. Aline Brochure was going to order for her.  She said the brace was to take the pressure off of the bone in her knee.   I did not see anything in her last note suggesting that a knee brace be ordered.  She said she thought this was to be given to her on her next visit on the 22nd before she goes to the beach.  I saw where Dr Aline Brochure said he would give her another knee injection before her beach trip but nothing about a brace.    Do you want to look at this for me or maybe speak to Dr. Aline Brochure about ordering a brace for her?  Thanks

## 2020-04-13 NOTE — Telephone Encounter (Signed)
ITS THE UNLOADER BRACE   I DONT THINK OURS WILL FIT CAN OUR BRACE GUY FIT HER

## 2020-04-14 NOTE — Telephone Encounter (Addendum)
1.Medial or lateral?  2.can you make addendum to the note to indicate she needs brace? And why off shelf will not work ? Large difference between thigh and calf circumference normally will do. Also high BMI in addtition  Thanks

## 2020-04-14 NOTE — Telephone Encounter (Signed)
Completed.

## 2020-04-14 NOTE — Telephone Encounter (Signed)
Sent to lucas  Asked him to call her

## 2020-04-19 ENCOUNTER — Other Ambulatory Visit: Payer: Self-pay

## 2020-04-19 ENCOUNTER — Encounter: Payer: Self-pay | Admitting: Orthopedic Surgery

## 2020-04-19 ENCOUNTER — Ambulatory Visit (INDEPENDENT_AMBULATORY_CARE_PROVIDER_SITE_OTHER): Payer: Medicare Other | Admitting: Orthopedic Surgery

## 2020-04-19 DIAGNOSIS — M25562 Pain in left knee: Secondary | ICD-10-CM

## 2020-04-19 DIAGNOSIS — M25462 Effusion, left knee: Secondary | ICD-10-CM | POA: Diagnosis not present

## 2020-04-19 DIAGNOSIS — Z6841 Body Mass Index (BMI) 40.0 and over, adult: Secondary | ICD-10-CM

## 2020-04-19 DIAGNOSIS — G8929 Other chronic pain: Secondary | ICD-10-CM

## 2020-04-19 NOTE — Progress Notes (Addendum)
Chief Complaint  Patient presents with  . Knee Pain    left     Encounter Diagnoses  Name Primary?  . Morbid obesity ([place]) Yes  . Body mass index 45.0-49.9, adult ([place])   . Chronic pain of left knee   . Effusion of left knee     The patient meets the AMA guidelines for Morbid (severe) obesity with a BMI > 40.0 and I have recommended weight loss.   Flower returns for an injection in her left knee going to the beach  We aspirated 10 cc of fluid injected 40 of Depo-Medrol  Procedure note injection and aspiration left knee joint  Verbal consent was obtained to aspirate and inject the left knee joint   Timeout was completed to confirm the site of aspiration and injection  An 18-gauge needle was used to aspirate the left knee joint from a suprapatellar lateral approach.  The medications used were 40 mg of Depo-Medrol and 1% lidocaine 3 cc  Anesthesia was provided by ethyl chloride and the skin was prepped with alcohol.  After cleaning the skin with alcohol an 18-gauge needle was used to aspirate the right knee joint.  We obtained 10 cc of fluid clr yellow   We followed this by injection of 40 mg of Depo-Medrol and 3 cc 1% lidocaine.  There were no complications. A sterile bandage was applied.

## 2020-04-19 NOTE — Patient Instructions (Signed)

## 2020-04-25 ENCOUNTER — Other Ambulatory Visit (INDEPENDENT_AMBULATORY_CARE_PROVIDER_SITE_OTHER): Payer: Self-pay | Admitting: Gastroenterology

## 2020-04-25 NOTE — Telephone Encounter (Signed)
Please advise 

## 2020-04-27 ENCOUNTER — Other Ambulatory Visit: Payer: Self-pay

## 2020-04-27 ENCOUNTER — Ambulatory Visit (INDEPENDENT_AMBULATORY_CARE_PROVIDER_SITE_OTHER): Payer: Medicare Other | Admitting: Gastroenterology

## 2020-04-27 ENCOUNTER — Encounter (INDEPENDENT_AMBULATORY_CARE_PROVIDER_SITE_OTHER): Payer: Self-pay | Admitting: Gastroenterology

## 2020-04-27 VITALS — BP 118/83 | HR 76 | Temp 96.7°F | Ht 65.0 in | Wt 260.3 lb

## 2020-04-27 DIAGNOSIS — K219 Gastro-esophageal reflux disease without esophagitis: Secondary | ICD-10-CM

## 2020-04-27 DIAGNOSIS — K5909 Other constipation: Secondary | ICD-10-CM | POA: Diagnosis not present

## 2020-04-27 DIAGNOSIS — K2270 Barrett's esophagus without dysplasia: Secondary | ICD-10-CM

## 2020-04-27 MED ORDER — LINACLOTIDE 290 MCG PO CAPS: 290.0000 ug | ORAL_CAPSULE | Freq: Every day | ORAL | 6 refills | Status: DC

## 2020-04-27 MED ORDER — HYOSCYAMINE SULFATE SL 0.125 MG SL SUBL: 0.1250 mg | SUBLINGUAL_TABLET | Freq: Two times a day (BID) | SUBLINGUAL | 3 refills | Status: AC | PRN

## 2020-04-27 MED ORDER — PANTOPRAZOLE SODIUM 40 MG PO TBEC: 40.0000 mg | DELAYED_RELEASE_TABLET | Freq: Two times a day (BID) | ORAL | 3 refills | Status: DC

## 2020-04-27 NOTE — Patient Instructions (Signed)
We are increasing linzess to 274mcg - stop 115mcg dose when starting increase dose.  No BCs or Goodys 5 days prior to endoscopy/colonoscopy  Continue protonix 40mg  twice a day

## 2020-04-27 NOTE — Progress Notes (Signed)
Patient profile: [name] is a 54 y.o. female seen for follow-up. Last seen October 2020- she has a history of constipation & begin Linzess after her last visit.  She is also on Protonix 40 mg twice a day. She was recommended to supplement with Pepcid.  History of Present Illness: [name] is seen today for follow-up of GERD.  After last visit she was recommended to decrease Protonix to once a day alternating with twice daily dosing.  She feels her symptoms became severe with once a day dosing and she increased back to twice a day every day and feels symptoms necessitate twice daily dosing.  She denies any nausea, vomiting, dysphagia. No epigastric pain.  She does continue to use Linzess 145 mcg and 3 stool softeners daily.  She is now having a stool every other day but does feel she has to eat prunes and salads to keep stools regular.  She denies any blood in stool.  Occasionally has flares of abdominal pain and irritable bowel-like symptoms approximately once a month that resolve with Levsin which she takes as needed.  She is on oxycodone 10 mgm 10 4 times a day which may be worsening constipation, suffers w/ severe back pain.   She takes diclofenac twice a day with food.  She does take BCs twice a week.  She is trying to lose weight in preparation for possible knee surgery.   Wt Readings from Last 3 Encounters:  04/27/20 260 lb 4.8 oz (118.1 kg)  04/19/20 262 lb (118.8 kg)  03/23/20 266 lb (120.7 kg)    Impression:  2016 EGD findings: Small sliding hiatal hernia with short tongue of salmon colored mucosa at distal esophagus. Was biopsied for routine histology. No evidence of erosive esophagitis, gastritis or peptic ulcer disease. Colonoscopy findings: Examination performed to cecum. No evidence of colonic polyps or other abnormalities but examination somewhat suboptimal because of quality of prep. Small external hemorrhoids  Biopsy from distal esophagus confirms  Barrett's. Patient has short segment Barrett's. Next EGD and colonoscopy in 5 years.  Past Medical History:  Past Medical History:  Diagnosis Date  . ADHD (attention deficit hyperactivity disorder)   . Anxiety   . Arthritis    disk disease  . Barrett esophagus   . Chronic pain   . COPD (chronic obstructive pulmonary disease) ([place])   . Depression   . Dyspnea   . Fibromyalgia   . GERD (gastroesophageal reflux disease)    IBS  . Headache   . Hot flashes 04/11/2015  . IBS (irritable bowel syndrome)   . Obesity   . Peripheral arterial disease ([place])   . Plantar fasciitis of left foot   . PTSD (post-traumatic stress disorder)   . RLQ abdominal pain 04/11/2015  . Vocal cord cancer [facility])    surgery corrected the problem     Problem List: Patient Active Problem List   Diagnosis Date Noted  . Dry skin 08/10/2018  . Decreased libido without sexual dysfunction 08/10/2018  . Depression 08/10/2018  . Screening for colorectal cancer 08/10/2018  . Routine cervical smear 08/10/2018  . Encounter for gynecological examination with Papanicolaou smear of cervix 08/10/2018  . Screening examination for STD (sexually transmitted disease) 08/10/2018  . Acquired spondylolisthesis of lumbosacral region 08/21/2017  . S/P left knee arthroscopy 12/25/16 07/03/2017  . Derangement of posterior horn of medial meniscus of left knee   . Lateral meniscus, anterior horn derangement, left   . Chondromalacia of patellofemoral joint, left   .  Lumbar adjacent segment disease with spondylolisthesis 08/08/2016  . RLQ abdominal pain 04/11/2015  . Hot flashes 04/11/2015  . Short-segment Barrett's esophagus 02/28/2015  . Peripheral arterial disease ([place]) 12/13/2013  . Status post rotator cuff repair 04/15/2013  . Rotator cuff tear 11/26/2012  . Rotator cuff syndrome of right shoulder 10/27/2012  . Right shoulder pain 10/27/2012  . HELICOBACTER PYLORI [place] INFECTION 11/03/2009  . DEPRESSION 11/02/2009  .  RESTLESS LEG SYNDROME 11/02/2009  . GASTROESOPHAGEAL REFLUX DISEASE, CHRONIC 11/02/2009  . Chronic constipation 11/02/2009  . IRRITABLE BOWEL SYNDROME 11/02/2009  . ARTHRITIS 11/02/2009  . BACK PAIN, CHRONIC 11/02/2009  . NAUSEA 11/02/2009  . DIARRHEA 11/02/2009  . ABDOMINAL PAIN 11/02/2009  . RECTAL BLEEDING, HX OF 11/02/2009    Past Surgical History: Past Surgical History:  Procedure Laterality Date  . ANTERIOR CERVICAL DECOMP/DISCECTOMY FUSION  08/18/2012   Procedure: ANTERIOR CERVICAL DECOMPRESSION/DISCECTOMY FUSION 2 LEVELS;  Surgeon: Faythe Ghee, MD;  Location: [facility];  Service: Neurosurgery;  Laterality: N/A;  anterior cervical five-six,six-seven decompression fusion plating  . ANTERIOR LAT LUMBAR FUSION  03/19/2012   Procedure: ANTERIOR LATERAL LUMBAR FUSION 1 LEVEL;  Surgeon: Faythe Ghee, MD;  Location: [facility];  Service: Neurosurgery;  Laterality: Right;  Anterolateral Lumbar Interbody Fusion, Lumbar Three-Four (Hand broke through glove and nat in the room)  . BACK SURGERY    . BUNIONECTOMY Left 08/04/2014   Procedure: LEFT FOOT LAPIDUS BUNION CORRECTION, LEFT SECOND TARSALMETATARSAL JOINT DEBRIDEMENT AND LEFT MODIFIED MCBRIDE BUNIONECTOMY;  Surgeon: [name], MD;  Location: [facility];  Service: Orthopedics;  Laterality: Left;  . CARPAL TUNNEL RELEASE Bilateral   . CHONDROPLASTY Left 12/25/2016   Procedure: CHONDROPLASTY LEFT PATELLA;  Surgeon: Carole Civil, MD;  Location: [facility];  Service: Orthopedics;  Laterality: Left;  . CLEFT PALATE REPAIR    . COLONOSCOPY N/A 02/16/2015   Procedure: COLONOSCOPY;  Surgeon: Rogene Houston, MD;  [facility];  Service: Endoscopy;  Laterality: N/A;  155  . ENDOMETRIAL ABLATION    . ESOPHAGOGASTRODUODENOSCOPY N/A 02/16/2015   Procedure: ESOPHAGOGASTRODUODENOSCOPY (EGD);  Surgeon: Rogene Houston, MD;  [facility];  Service: Endoscopy;  Laterality: N/A;  . KNEE ARTHROSCOPY WITH  MEDIAL MENISECTOMY Left 12/25/2016   Procedure: KNEE ARTHROSCOPY WITH MEDIAL AND LATERAL MENISECTOMY;  Surgeon: Carole Civil, MD;  Location: [facility];  Service: Orthopedics;  Laterality: Left;  . LAPAROSCOPIC TUBAL LIGATION     [facility]  . MAXIMUM ACCESS (MAS)POSTERIOR LUMBAR INTERBODY FUSION (PLIF) 2 LEVEL  08/08/2016  . NOSE SURGERY    . SHOULDER OPEN ROTATOR CUFF REPAIR Right 04/02/2013   Procedure: ROTATOR CUFF REPAIR SHOULDER OPEN;  Surgeon: Carole Civil, MD;  Location: [facility];  Service: Orthopedics;  Laterality: Right;  . THROAT SURGERY  2000   for throat cancer/no problems intubation...[facility]  . TONSILLECTOMY    . TUBAL LIGATION      Allergies: Allergies  Allergen Reactions  . Bee Venom Anaphylaxis  . Morphine And Related Other (See Comments)    Severe headache  . Adhesive [Tape] Itching    Some types of adhesive tape and paper tape rip up the skin.        Home Medications:  Current Outpatient Medications:  .  albuterol (PROVENTIL HFA;VENTOLIN HFA) 108 (90 Base) MCG/ACT inhaler, Inhale 2 puffs into the lungs every 6 (six) hours as needed for wheezing or shortness of breath., Disp: , Rfl:  .  amphetamine-dextroamphetamine (ADDERALL) 30  MG tablet, , Disp: , Rfl:  .  augmented betamethasone dipropionate (DIPROLENE-AF) 0.05 % ointment, Apply 3.41 application topically 2 (two) times daily., Disp: , Rfl:  .  Cholecalciferol (VITAMIN D-3) 5000 units TABS, Take 2,000 Units by mouth daily. , Disp: , Rfl:  .  diclofenac (VOLTAREN) 75 MG EC tablet, Take 1 tablet (75 mg total) by mouth 2 (two) times daily as needed. TAKE (1) TABLET BY MOUTH TWICE DAILY WITH MEALS., Disp: 60 tablet, Rfl: 5 .  docusate sodium (COLACE) 100 MG capsule, Take 300 mg by mouth at bedtime. , Disp: , Rfl:  .  EPINEPHrine 0.3 mg/0.3 mL IJ SOAJ injection, Inject 0.3 mg into the muscle daily as needed (for anaphalytic allergic reactions). , Disp: , Rfl:  .  escitalopram (LEXAPRO) 20 MG tablet,  Take 20 mg by mouth at bedtime. , Disp: , Rfl:  .  gabapentin (NEURONTIN) 300 MG capsule, Take 300 mg by mouth 3 (three) times daily. , Disp: , Rfl:  .  methocarbamol (ROBAXIN) 750 MG tablet, 2 (two) times daily. , Disp: , Rfl:  .  Oxycodone HCl 10 MG TABS, 4 (four) times daily. , Disp: , Rfl:  .  pantoprazole (PROTONIX) 40 MG tablet, Take 1 tablet (40 mg total) by mouth 2 (two) times daily before a meal., Disp: 180 tablet, Rfl: 3 .  Polyethyl Glycol-Propyl Glycol (LUBRICANT EYE DROPS) 0.4-0.3 % SOLN, Place 1-2 drops into both eyes 3 (three) times daily as needed (for dry eyes.)., Disp: , Rfl:  .  tiZANidine (ZANAFLEX) 4 MG tablet, Take 4 mg by mouth at bedtime. , Disp: , Rfl:  .  topiramate (TOPAMAX) 50 MG tablet, Take 50 mg by mouth at bedtime. , Disp: , Rfl:  .  XIIDRA 5 % SOLN, INSTILL (1) DROP INTOHBOTH EYES TWICE DAILY., Disp: , Rfl:  .  Aspirin-Salicylamide-Caffeine (BC HEADACHE POWDER PO), Take 1 packet by mouth daily as needed (headaches)., Disp: , Rfl:  .  BIOTIN PO, Take by mouth daily., Disp: , Rfl:  .  cetirizine (ZYRTEC) 10 MG tablet, Take 10 mg by mouth daily., Disp: , Rfl:  .  CHANTIX 1 MG tablet, Take 1 mg by mouth 2 (two) times daily. , Disp: , Rfl:  .  Hyoscyamine Sulfate SL (LEVSIN/SL) 0.125 MG SUBL, Place 0.125 mg under the tongue 2 (two) times daily as needed (abd pain)., Disp: 60 tablet, Rfl: 3 .  linaclotide (LINZESS) 290 MCG CAPS capsule, Take 1 capsule (290 mcg total) by mouth daily before breakfast., Disp: 30 capsule, Rfl: 6   Family History: family history includes Cancer in her paternal grandfather, paternal grandmother, paternal uncle, paternal uncle, paternal uncle, and paternal uncle; Diabetes in her mother; Heart disease in her maternal grandfather; Hypertension in her mother; Other in her daughter.    Social History:   reports that she has been smoking cigarettes. She has a 15.00 pack-year smoking history. She has never used smokeless tobacco. She reports current  alcohol use. She reports previous drug use. Drug: Marijuana.  Currently smoking a third a pack a week  Review of Systems: Constitutional: Denies weight loss/weight gain  Eyes: No changes in vision. ENT: No oral lesions, sore throat.  GI: see HPI.  Heme/Lymph: No easy bruising.  CV: No chest pain.  GU: No hematuria.  Integumentary: No rashes.  Neuro: No headaches.  Psych: No depression/anxiety.  Endocrine: No heat/cold intolerance.  Allergic/Immunologic: No urticaria.  Resp: No cough, SOB.  Musculoskeletal: No joint swelling.    Physical  Examination: BP 118/83 (BP Location: Right Arm, Patient Position: Sitting, Cuff Size: Normal)   Pulse 76   Temp (!) 96.7 F (35.9 C) (Temporal)   Ht 5\' 5"  (1.651 m)   Wt 260 lb 4.8 oz (118.1 kg)   BMI 43.32 kg/m  Gen: NAD, alert and oriented x 4 HEENT: PEERLA, EOMI, Neck: supple, no JVD Chest: CTA bilaterally, no wheezes, crackles, or other adventitious sounds CV: RRR, no m/g/c/r Abd: soft, NT, ND, +BS in all four quadrants; no HSM, guarding, ridigity, or rebound tenderness Ext: no edema, well perfused with 2+ pulses, Skin: no rash or lesions noted on observed skin Lymph: no noted LAD  Data Reviewed:  Last labs in epic for review January 2020   Assessment/Plan: Ms. Winegardner is a 54 y.o. female seen today for follow-up of GERD, irritable bowel, constipation  1.  Chronic constipation-likely narcotic induced.  We will try increasing Linzess from 145 mcg a day to 290 mcg a day.  Also encouraged her to make sure she is getting adequate fluid.  She is on oxycodone 4 times a day which is likely worsening her constipation.  Reviewed do need her to be moving bowels well prior to colonoscopy prep to decrease the risk of suboptimal prep. To contact me if symptoms do not improve with Linzess   2.  GERD/history of Barrett's esophagus-due for repeat EGD, last 5 years ago.  Was symptomatic when tried to wean off 40 mg twice daily dosing.  Diet  modifications reviewed.  Recommended decreasing BCs  3. Colon cancer screening-last colonoscopy had suboptimal prep, likely due to her baseline constipation. Hopefully this will improve with Linzess. We will repeat a colonoscopy at time of endoscopy denies family history of colon polyps colon cancer  Alleen was seen today for barretts eshophagus, gastroesophageal reflux and irritable bowel syndrome.  Diagnoses and all orders for this visit:  Chronic GERD  Barrett's esophagus without dysplasia  Chronic constipation  Other orders -     linaclotide (LINZESS) 290 MCG CAPS capsule; Take 1 capsule (290 mcg total) by mouth daily before breakfast. -     pantoprazole (PROTONIX) 40 MG tablet; Take 1 tablet (40 mg total) by mouth 2 (two) times daily before a meal. -     Hyoscyamine Sulfate SL (LEVSIN/SL) 0.125 MG SUBL; Place 0.125 mg under the tongue 2 (two) times daily as needed (abd pain).     Patient denies CP, SOB, and use of blood thinners. I discussed the risks and benefits of procedure including bleeding, perforation, infection, missed lesions, medication reactions and possible hospitalization or surgery if complications. All questions answered.    I personally performed the service, non-incident to. (WP)  Laurine Blazer, [facility]

## 2020-05-01 ENCOUNTER — Encounter (INDEPENDENT_AMBULATORY_CARE_PROVIDER_SITE_OTHER): Payer: Self-pay | Admitting: *Deleted

## 2020-05-01 ENCOUNTER — Telehealth (INDEPENDENT_AMBULATORY_CARE_PROVIDER_SITE_OTHER): Payer: Self-pay | Admitting: *Deleted

## 2020-05-01 MED ORDER — PLENVU 140 G PO SOLR: 1.0000 | Freq: Once | ORAL | 0 refills | Status: AC

## 2020-05-01 NOTE — Telephone Encounter (Signed)
Patient aware, instructions updated

## 2020-05-01 NOTE — Telephone Encounter (Signed)
Patient needs Plenvu (copay card) ° °

## 2020-05-01 NOTE — Telephone Encounter (Signed)
Ann - Dr Olevia Perches co sign note recommends a 2 day prep since patient has hx of poor prep - can you let her know? Thanks.

## 2020-05-17 ENCOUNTER — Other Ambulatory Visit (INDEPENDENT_AMBULATORY_CARE_PROVIDER_SITE_OTHER): Payer: Self-pay | Admitting: *Deleted

## 2020-05-18 ENCOUNTER — Other Ambulatory Visit: Payer: Self-pay

## 2020-05-18 ENCOUNTER — Encounter: Payer: Self-pay | Admitting: Orthopedic Surgery

## 2020-05-18 ENCOUNTER — Ambulatory Visit (INDEPENDENT_AMBULATORY_CARE_PROVIDER_SITE_OTHER): Payer: Medicare Other | Admitting: Orthopedic Surgery

## 2020-05-18 VITALS — BP 147/87 | HR 81 | Ht 65.0 in | Wt 260.0 lb

## 2020-05-18 DIAGNOSIS — M1712 Unilateral primary osteoarthritis, left knee: Secondary | ICD-10-CM

## 2020-05-18 DIAGNOSIS — Z6841 Body Mass Index (BMI) 40.0 and over, adult: Secondary | ICD-10-CM

## 2020-05-18 DIAGNOSIS — M1711 Unilateral primary osteoarthritis, right knee: Secondary | ICD-10-CM

## 2020-05-18 DIAGNOSIS — M25462 Effusion, left knee: Secondary | ICD-10-CM | POA: Diagnosis not present

## 2020-05-18 DIAGNOSIS — M25562 Pain in left knee: Secondary | ICD-10-CM | POA: Diagnosis not present

## 2020-05-18 DIAGNOSIS — M5410 Radiculopathy, site unspecified: Secondary | ICD-10-CM

## 2020-05-18 DIAGNOSIS — G8929 Other chronic pain: Secondary | ICD-10-CM

## 2020-05-18 MED ORDER — GABAPENTIN 300 MG PO CAPS: ORAL_CAPSULE | ORAL | 5 refills | Status: DC

## 2020-05-18 NOTE — Progress Notes (Addendum)
Chief Complaint  Patient presents with  . Knee Pain    left     Encounter Diagnoses  Name Primary?  . Morbid obesity ([place]) Yes  . Body mass index 45.0-49.9, adult ([place])   . Chronic pain of left knee   . Effusion of left knee   . Primary osteoarthritis of left knee   . Primary osteoarthritis of right knee    The patient meets the AMA guidelines for Morbid (severe) obesity with a BMI > 40.0 and I have recommended weight loss  " YOU VE GOT TO DO SOMETHING WITH THIS KNEE, I CANT TAKE IT ANYMORE"   Procedure note injection and aspiration left knee joint  Verbal consent was obtained to aspirate and inject the left knee joint   Timeout was completed to confirm the site of aspiration and injection  An 18-gauge needle was used to aspirate the left knee joint from a suprapatellar lateral approach.  The medications used were 40 mg of Depo-Medrol and 1% lidocaine 3 cc  Anesthesia was provided by ethyl chloride and the skin was prepped with alcohol.  After cleaning the skin with alcohol an 18-gauge needle was used to aspirate the right knee joint.  We obtained 20 cc of fluid left knee   We followed this by injection of 40 mg of Depo-Medrol and 3 cc 1% lidocaine.  There were no complications. A sterile bandage was applied.  Meds ordered this encounter  Medications  . gabapentin (NEURONTIN) 300 MG capsule    Sig: 2 capsules every 6 hrs    Dispense:  90 capsule    Refill:  5

## 2020-05-18 NOTE — Patient Instructions (Addendum)
You have received an injection of steroids into the joint. 15% of patients will have increased pain within the 24 hours postinjection.   This is transient and will go away.   We recommend that you use ice packs on the injection site for 20 minutes every 2 hours and extra strength Tylenol 2 tablets every 8 as needed until the pain resolves.  If you continue to have pain after taking the Tylenol and using the ice please call the office for further instructions.  Consider increasing gabapentin to 2400 mg in divided dosing 3 x a day or 4 x a day  600 mg 4 x a day

## 2020-05-18 NOTE — Addendum Note (Signed)
Addended by: Carole Civil on: 05/18/2020 10:31 AM   Modules accepted: Orders

## 2020-05-22 ENCOUNTER — Ambulatory Visit: Payer: Medicare Other | Admitting: Orthopedic Surgery

## 2020-05-26 NOTE — Patient Instructions (Signed)
[name]  05/26/2020     @PREFPERIOPPHARMACY @   Your procedure is scheduled on  05/31/2020.  Report to [name] at  [place]  [facility].  Call this number if you have problems the morning of surgery:  [phone]   Remember:  Follow the diet and prep instructions given to you by the office.                      Take these medicines the morning of surgery with A SIP OF WATER  Adderall, allegra, gabapentin, mobic(if needed), robaxin(if needed), oxycodone(if needed), protonix.    Do not wear jewelry, make-up or nail polish.  Do not wear lotions, powders, or perfumes. Please wear deodorant and brush your teeth.  Do not shave 48 hours prior to surgery.  Men may shave face and neck.  Do not bring valuables to the hospital.  [facility] is not responsible for any belongings or valuables.  Contacts, dentures or bridgework may not be worn into surgery.  Leave your suitcase in the car.  After surgery it may be brought to your room.  For patients admitted to the hospital, discharge time will be determined by your treatment team.  Patients discharged the day of surgery will not be allowed to drive home.   Name and phone number of your driver:   family Special instructions:  DO NOT smoke the morning of your procedure.  Please read over the following fact sheets that you were given. Anesthesia Post-op Instructions and Care and Recovery After Surgery       Upper Endoscopy, Adult, Care After This sheet gives you information about how to care for yourself after your procedure. Your health care provider may also give you more specific instructions. If you have problems or questions, contact your health care provider. What can I expect after the procedure? After the procedure, it is common to have:  A sore throat.  Mild stomach pain or discomfort.  Bloating.  Nausea. Follow these instructions at home:   Follow instructions from your health care provider about what to  eat or drink after your procedure.  Return to your normal activities as told by your health care provider. Ask your health care provider what activities are safe for you.  Take over-the-counter and prescription medicines only as told by your health care provider.  Do not drive for 24 hours if you were given a sedative during your procedure.  Keep all follow-up visits as told by your health care provider. This is important. Contact a health care provider if you have:  A sore throat that lasts longer than one day.  Trouble swallowing. Get help right away if:  You vomit blood or your vomit looks like coffee grounds.  You have: ? A fever. ? Bloody, black, or tarry stools. ? A severe sore throat or you cannot swallow. ? Difficulty breathing. ? Severe pain in your chest or abdomen. Summary  After the procedure, it is common to have a sore throat, mild stomach discomfort, bloating, and nausea.  Do not drive for 24 hours if you were given a sedative during the procedure.  Follow instructions from your health care provider about what to eat or drink after your procedure.  Return to your normal activities as told by your health care provider. This information is not intended to replace advice given to you by your health care provider. Make sure you discuss any questions you  have with your health care provider. Document Revised: 01/06/2018 Document Reviewed: 12/15/2017 Elsevier Patient Education  [place].  Colonoscopy, Adult, Care After This sheet gives you information about how to care for yourself after your procedure. Your health care provider may also give you more specific instructions. If you have problems or questions, contact your health care provider. What can I expect after the procedure? After the procedure, it is common to have:  A small amount of blood in your stool for 24 hours after the procedure.  Some gas.  Mild cramping or bloating of your  abdomen. Follow these instructions at home: Eating and drinking   Drink enough fluid to keep your urine pale yellow.  Follow instructions from your health care provider about eating or drinking restrictions.  Resume your normal diet as instructed by your health care provider. Avoid heavy or fried foods that are hard to digest. Activity  Rest as told by your health care provider.  Avoid sitting for a long time without moving. Get up to take short walks every 1-2 hours. This is important to improve blood flow and breathing. Ask for help if you feel weak or unsteady.  Return to your normal activities as told by your health care provider. Ask your health care provider what activities are safe for you. Managing cramping and bloating   Try walking around when you have cramps or feel bloated.  Apply heat to your abdomen as told by your health care provider. Use the heat source that your health care provider recommends, such as a moist heat pack or a heating pad. ? Place a towel between your skin and the heat source. ? Leave the heat on for 20-30 minutes. ? Remove the heat if your skin turns bright red. This is especially important if you are unable to feel pain, heat, or cold. You may have a greater risk of getting burned. General instructions  For the first 24 hours after the procedure: ? Do not drive or use machinery. ? Do not sign important documents. ? Do not drink alcohol. ? Do your regular daily activities at a slower pace than normal. ? Eat soft foods that are easy to digest.  Take over-the-counter and prescription medicines only as told by your health care provider.  Keep all follow-up visits as told by your health care provider. This is important. Contact a health care provider if:  You have blood in your stool 2-3 days after the procedure. Get help right away if you have:  More than a small spotting of blood in your stool.  Large blood clots in your stool.  Swelling  of your abdomen.  Nausea or vomiting.  A fever.  Increasing pain in your abdomen that is not relieved with medicine. Summary  After the procedure, it is common to have a small amount of blood in your stool. You may also have mild cramping and bloating of your abdomen.  For the first 24 hours after the procedure, do not drive or use machinery, sign important documents, or drink alcohol.  Get help right away if you have a lot of blood in your stool, nausea or vomiting, a fever, or increased pain in your abdomen. This information is not intended to replace advice given to you by your health care provider. Make sure you discuss any questions you have with your health care provider. Document Revised: 02/08/2019 Document Reviewed: 02/08/2019 Elsevier Patient Education  [place] After These instructions provide  you with information about caring for yourself after your procedure. Your health care provider may also give you more specific instructions. Your treatment has been planned according to current medical practices, but problems sometimes occur. Call your health care provider if you have any problems or questions after your procedure. What can I expect after the procedure? After your procedure, you may:  Feel sleepy for several hours.  Feel clumsy and have poor balance for several hours.  Feel forgetful about what happened after the procedure.  Have poor judgment for several hours.  Feel nauseous or vomit.  Have a sore throat if you had a breathing tube during the procedure. Follow these instructions at home: For at least 24 hours after the procedure:      Have a responsible adult stay with you. It is important to have someone help care for you until you are awake and alert.  Rest as needed.  Do not: ? Participate in activities in which you could fall or become injured. ? Drive. ? Use heavy machinery. ? Drink alcohol. ? Take  sleeping pills or medicines that cause drowsiness. ? Make important decisions or sign legal documents. ? Take care of children on your own. Eating and drinking  Follow the diet that is recommended by your health care provider.  If you vomit, drink water, juice, or soup when you can drink without vomiting.  Make sure you have little or no nausea before eating solid foods. General instructions  Take over-the-counter and prescription medicines only as told by your health care provider.  If you have sleep apnea, surgery and certain medicines can increase your risk for breathing problems. Follow instructions from your health care provider about wearing your sleep device: ? Anytime you are sleeping, including during daytime naps. ? While taking prescription pain medicines, sleeping medicines, or medicines that make you drowsy.  If you smoke, do not smoke without supervision.  Keep all follow-up visits as told by your health care provider. This is important. Contact a health care provider if:  You keep feeling nauseous or you keep vomiting.  You feel light-headed.  You develop a rash.  You have a fever. Get help right away if:  You have trouble breathing. Summary  For several hours after your procedure, you may feel sleepy and have poor judgment.  Have a responsible adult stay with you for at least 24 hours or until you are awake and alert. This information is not intended to replace advice given to you by your health care provider. Make sure you discuss any questions you have with your health care provider. Document Revised: 10/13/2017 Document Reviewed: 11/05/2015 Elsevier Patient Education  [place].

## 2020-05-29 ENCOUNTER — Encounter (HOSPITAL_COMMUNITY)
Admission: RE | Admit: 2020-05-29 | Discharge: 2020-05-29 | Disposition: A | Discharge status: Home / Self Care | Payer: Medicare Other | Source: Ambulatory Visit | Attending: Internal Medicine | Admitting: Internal Medicine

## 2020-05-29 ENCOUNTER — Ambulatory Visit: Payer: Medicaid Other | Admitting: Orthopedic Surgery

## 2020-05-29 ENCOUNTER — Other Ambulatory Visit: Payer: Self-pay

## 2020-05-29 ENCOUNTER — Other Ambulatory Visit (HOSPITAL_COMMUNITY)
Admission: RE | Admit: 2020-05-29 | Discharge: 2020-05-29 | Disposition: A | Discharge status: Home / Self Care | Payer: Medicare Other | Source: Ambulatory Visit | Attending: Internal Medicine | Admitting: Internal Medicine

## 2020-05-29 ENCOUNTER — Encounter (HOSPITAL_COMMUNITY): Payer: Self-pay

## 2020-05-29 DIAGNOSIS — Z20822 Contact with and (suspected) exposure to covid-19: Secondary | ICD-10-CM | POA: Diagnosis not present

## 2020-05-29 DIAGNOSIS — Z01818 Encounter for other preprocedural examination: Secondary | ICD-10-CM | POA: Insufficient documentation

## 2020-05-29 LAB — SARS CORONAVIRUS 2 (TAT 6-24 HRS): SARS Coronavirus 2: NEGATIVE

## 2020-05-29 LAB — PREGNANCY, URINE: Preg Test, Ur: NEGATIVE

## 2020-05-31 ENCOUNTER — Ambulatory Visit (HOSPITAL_COMMUNITY): Payer: Medicare Other | Admitting: Certified Registered"

## 2020-05-31 ENCOUNTER — Encounter (HOSPITAL_COMMUNITY)
Admission: RE | Disposition: A | Discharge status: Home / Self Care | Payer: Self-pay | Source: Home / Self Care | Attending: Internal Medicine

## 2020-05-31 ENCOUNTER — Other Ambulatory Visit: Payer: Self-pay

## 2020-05-31 ENCOUNTER — Ambulatory Visit (HOSPITAL_COMMUNITY)
Admission: RE | Admit: 2020-05-31 | Discharge: 2020-05-31 | Disposition: A | Discharge status: Home / Self Care | Payer: Medicare Other | Attending: Internal Medicine | Admitting: Internal Medicine

## 2020-05-31 ENCOUNTER — Encounter (HOSPITAL_COMMUNITY): Payer: Self-pay | Admitting: Internal Medicine

## 2020-05-31 DIAGNOSIS — F17210 Nicotine dependence, cigarettes, uncomplicated: Secondary | ICD-10-CM | POA: Insufficient documentation

## 2020-05-31 DIAGNOSIS — Z8249 Family history of ischemic heart disease and other diseases of the circulatory system: Secondary | ICD-10-CM | POA: Diagnosis not present

## 2020-05-31 DIAGNOSIS — Z8521 Personal history of malignant neoplasm of larynx: Secondary | ICD-10-CM | POA: Diagnosis not present

## 2020-05-31 DIAGNOSIS — Z791 Long term (current) use of non-steroidal anti-inflammatories (NSAID): Secondary | ICD-10-CM | POA: Diagnosis not present

## 2020-05-31 DIAGNOSIS — K644 Residual hemorrhoidal skin tags: Secondary | ICD-10-CM | POA: Diagnosis not present

## 2020-05-31 DIAGNOSIS — K2970 Gastritis, unspecified, without bleeding: Secondary | ICD-10-CM | POA: Diagnosis not present

## 2020-05-31 DIAGNOSIS — K581 Irritable bowel syndrome with constipation: Secondary | ICD-10-CM | POA: Insufficient documentation

## 2020-05-31 DIAGNOSIS — F4310 Post-traumatic stress disorder, unspecified: Secondary | ICD-10-CM | POA: Insufficient documentation

## 2020-05-31 DIAGNOSIS — Z981 Arthrodesis status: Secondary | ICD-10-CM | POA: Diagnosis not present

## 2020-05-31 DIAGNOSIS — K648 Other hemorrhoids: Secondary | ICD-10-CM

## 2020-05-31 DIAGNOSIS — F909 Attention-deficit hyperactivity disorder, unspecified type: Secondary | ICD-10-CM | POA: Insufficient documentation

## 2020-05-31 DIAGNOSIS — Z79899 Other long term (current) drug therapy: Secondary | ICD-10-CM | POA: Insufficient documentation

## 2020-05-31 DIAGNOSIS — K2270 Barrett's esophagus without dysplasia: Secondary | ICD-10-CM | POA: Diagnosis not present

## 2020-05-31 DIAGNOSIS — Z800 Family history of malignant neoplasm of digestive organs: Secondary | ICD-10-CM | POA: Diagnosis not present

## 2020-05-31 DIAGNOSIS — Z809 Family history of malignant neoplasm, unspecified: Secondary | ICD-10-CM | POA: Diagnosis not present

## 2020-05-31 DIAGNOSIS — Z91030 Bee allergy status: Secondary | ICD-10-CM | POA: Insufficient documentation

## 2020-05-31 DIAGNOSIS — Z888 Allergy status to other drugs, medicaments and biological substances status: Secondary | ICD-10-CM | POA: Insufficient documentation

## 2020-05-31 DIAGNOSIS — M797 Fibromyalgia: Secondary | ICD-10-CM | POA: Diagnosis not present

## 2020-05-31 DIAGNOSIS — K219 Gastro-esophageal reflux disease without esophagitis: Secondary | ICD-10-CM | POA: Insufficient documentation

## 2020-05-31 DIAGNOSIS — Z1211 Encounter for screening for malignant neoplasm of colon: Secondary | ICD-10-CM | POA: Insufficient documentation

## 2020-05-31 DIAGNOSIS — I739 Peripheral vascular disease, unspecified: Secondary | ICD-10-CM | POA: Insufficient documentation

## 2020-05-31 DIAGNOSIS — K2960 Other gastritis without bleeding: Secondary | ICD-10-CM

## 2020-05-31 DIAGNOSIS — J449 Chronic obstructive pulmonary disease, unspecified: Secondary | ICD-10-CM | POA: Diagnosis not present

## 2020-05-31 DIAGNOSIS — K449 Diaphragmatic hernia without obstruction or gangrene: Secondary | ICD-10-CM | POA: Insufficient documentation

## 2020-05-31 DIAGNOSIS — Z8719 Personal history of other diseases of the digestive system: Secondary | ICD-10-CM | POA: Diagnosis not present

## 2020-05-31 DIAGNOSIS — Z7982 Long term (current) use of aspirin: Secondary | ICD-10-CM | POA: Diagnosis not present

## 2020-05-31 DIAGNOSIS — Z6841 Body Mass Index (BMI) 40.0 and over, adult: Secondary | ICD-10-CM | POA: Insufficient documentation

## 2020-05-31 DIAGNOSIS — Z885 Allergy status to narcotic agent status: Secondary | ICD-10-CM | POA: Insufficient documentation

## 2020-05-31 DIAGNOSIS — Z91048 Other nonmedicinal substance allergy status: Secondary | ICD-10-CM | POA: Insufficient documentation

## 2020-05-31 DIAGNOSIS — Z8269 Family history of other diseases of the musculoskeletal system and connective tissue: Secondary | ICD-10-CM | POA: Diagnosis not present

## 2020-05-31 DIAGNOSIS — E669 Obesity, unspecified: Secondary | ICD-10-CM | POA: Insufficient documentation

## 2020-05-31 DIAGNOSIS — Z833 Family history of diabetes mellitus: Secondary | ICD-10-CM | POA: Insufficient documentation

## 2020-05-31 HISTORY — PX: COLONOSCOPY WITH PROPOFOL: SHX5780

## 2020-05-31 HISTORY — PX: ESOPHAGOGASTRODUODENOSCOPY (EGD) WITH PROPOFOL: SHX5813

## 2020-05-31 SURGERY — ESOPHAGOGASTRODUODENOSCOPY (EGD) WITH PROPOFOL
Anesthesia: General

## 2020-05-31 MED ORDER — LACTATED RINGERS IV SOLN: INTRAVENOUS | Status: DC | PRN

## 2020-05-31 MED ORDER — GLYCOPYRROLATE 0.2 MG/ML IJ SOLN
0.2000 mg | Freq: Once | INTRAMUSCULAR | Status: AC
  Administered 2020-05-31: 0.2 mg via INTRAVENOUS

## 2020-05-31 MED ORDER — GLYCOPYRROLATE 0.2 MG/ML IJ SOLN
INTRAMUSCULAR | Status: AC
  Filled 2020-05-31: qty 1

## 2020-05-31 MED ORDER — STERILE WATER FOR IRRIGATION IR SOLN
Status: DC | PRN
  Administered 2020-05-31: 1.5 mL

## 2020-05-31 MED ORDER — PROPOFOL 500 MG/50ML IV EMUL
INTRAVENOUS | Status: DC | PRN
  Administered 2020-05-31: 80 mg via INTRAVENOUS
  Administered 2020-05-31: 100 mg via INTRAVENOUS
  Administered 2020-05-31: 150 ug/kg/min via INTRAVENOUS

## 2020-05-31 MED ORDER — LACTATED RINGERS IV SOLN
INTRAVENOUS | Status: DC
  Administered 2020-05-31: 1000 mL via INTRAVENOUS

## 2020-05-31 MED ORDER — LIDOCAINE VISCOUS HCL 2 % MT SOLN
15.0000 mL | Freq: Once | OROMUCOSAL | Status: AC
  Administered 2020-05-31: 15 mL via OROMUCOSAL

## 2020-05-31 MED ORDER — LIDOCAINE HCL (CARDIAC) PF 100 MG/5ML IV SOSY
PREFILLED_SYRINGE | INTRAVENOUS | Status: DC | PRN
  Administered 2020-05-31: 50 mg via INTRAVENOUS

## 2020-05-31 MED ORDER — LIDOCAINE VISCOUS HCL 2 % MT SOLN
OROMUCOSAL | Status: AC
  Filled 2020-05-31: qty 15

## 2020-05-31 NOTE — Anesthesia Postprocedure Evaluation (Signed)
Anesthesia Post Note  Patient: [name]  Procedure(s) Performed: ESOPHAGOGASTRODUODENOSCOPY (EGD) WITH PROPOFOL (N/A ) COLONOSCOPY WITH PROPOFOL (N/A )  Anesthesia Type: General Level of consciousness: awake, oriented, awake and alert and patient cooperative Pain management: pain level controlled Vital Signs Assessment: post-procedure vital signs reviewed and stable Respiratory status: spontaneous breathing, respiratory function stable and nonlabored ventilation Cardiovascular status: blood pressure returned to baseline and stable Postop Assessment: no headache and no backache Anesthetic complications: no   No complications documented.   Last Vitals:  Vitals:   05/31/20 1109  BP: (!) 158/69  Pulse: 71  Resp: 12  Temp: 37.5 C  SpO2: 99%    Last Pain:  Vitals:   05/31/20 1217  TempSrc:   PainSc: 8                  Tacy Learn

## 2020-05-31 NOTE — Transfer of Care (Signed)
Immediate Anesthesia Transfer of Care Note  Patient: ZOIEY CHRISTY  Procedure(s) Performed: ESOPHAGOGASTRODUODENOSCOPY (EGD) WITH PROPOFOL (N/A ) COLONOSCOPY WITH PROPOFOL (N/A )  Patient Location: PACU  Anesthesia Type:General  Level of Consciousness: awake, alert , oriented and patient cooperative  Airway & Oxygen Therapy: Patient Spontanous Breathing and Patient connected to nasal cannula oxygen  Post-op Assessment: Report given to RN, Post -op Vital signs reviewed and stable and Patient moving all extremities  Post vital signs: Reviewed and stable  Last Vitals:  Vitals Value Taken Time  BP    Temp    Pulse 74 05/31/20 1254  Resp 22 05/31/20 1254  SpO2 98 % 05/31/20 1254  Vitals shown include unvalidated device data.  Last Pain:  Vitals:   05/31/20 1217  TempSrc:   PainSc: 8          Complications: No complications documented.

## 2020-05-31 NOTE — H&P (Signed)
[name] is an 54 y.o. female.   Chief Complaint: Patient is here for esophagogastroduodenoscopy and colonoscopy. HPI: Patient is 54 year old Caucasian female who has chronic GERD complicated by short segment Barrett's esophagus who is here for surveillance EGD.  Her last EGD with biopsy was in July 2016.  She is on double dose PPI and still having breakthrough symptoms but she is not watching her diet like she should.  He has chronic constipation and is using Linzess.  She denies melena or rectal bleeding.  Last colonoscopy was in July 2016.  Her prep was fair.  Therefore we decided to bring her back at 5 years instead of 10. History significant for colon carcinoma and maternal uncle who was in his 64s and died shortly thereafter of metastatic disease. Patient does not take anticoagulants she is on NSAIDs.  Past Medical History:  Diagnosis Date  . ADHD (attention deficit hyperactivity disorder)   . Anxiety   . Arthritis    disk disease  . Barrett esophagus   . Chronic pain   . COPD (chronic obstructive pulmonary disease) ([place])   . Depression   . Dyspnea   . Fibromyalgia   . GERD (gastroesophageal reflux disease)    IBS  . Headache   . Hot flashes 04/11/2015  . IBS (irritable bowel syndrome)   . Obesity   . Peripheral arterial disease ([place])   . Plantar fasciitis of left foot   . PTSD (post-traumatic stress disorder)   . RLQ abdominal pain 04/11/2015  . Vocal cord cancer [facility])    surgery corrected the problem     Past Surgical History:  Procedure Laterality Date  . ANTERIOR CERVICAL DECOMP/DISCECTOMY FUSION  08/18/2012   Procedure: ANTERIOR CERVICAL DECOMPRESSION/DISCECTOMY FUSION 2 LEVELS;  Surgeon: Faythe Ghee, MD;  Location: [place] NEURO ORS;  Service: Neurosurgery;  Laterality: N/A;  anterior cervical five-six,six-seven decompression fusion plating  . ANTERIOR LAT LUMBAR FUSION  03/19/2012   Procedure: ANTERIOR LATERAL LUMBAR FUSION 1 LEVEL;  Surgeon: Faythe Ghee, MD;   Location: [place] NEURO ORS;  Service: Neurosurgery;  Laterality: Right;  Anterolateral Lumbar Interbody Fusion, Lumbar Three-Four (Hand broke through glove and nat in the room)  . BACK SURGERY    . BUNIONECTOMY Left 08/04/2014   Procedure: LEFT FOOT LAPIDUS BUNION CORRECTION, LEFT SECOND TARSALMETATARSAL JOINT DEBRIDEMENT AND LEFT MODIFIED MCBRIDE BUNIONECTOMY;  Surgeon: [name], MD;  Location: [place];  Service: Orthopedics;  Laterality: Left;  . CARPAL TUNNEL RELEASE Bilateral   . CHONDROPLASTY Left 12/25/2016   Procedure: CHONDROPLASTY LEFT PATELLA;  Surgeon: Carole Civil, MD;  Location: [facility];  Service: Orthopedics;  Laterality: Left;  . CLEFT PALATE REPAIR    . COLONOSCOPY N/A 02/16/2015   Procedure: COLONOSCOPY;  Surgeon: Rogene Houston, MD;  [facility];  Service: Endoscopy;  Laterality: N/A;  155  . ENDOMETRIAL ABLATION    . ESOPHAGOGASTRODUODENOSCOPY N/A 02/16/2015   Procedure: ESOPHAGOGASTRODUODENOSCOPY (EGD);  Surgeon: Rogene Houston, MD;  [facility];  Service: Endoscopy;  Laterality: N/A;  . KNEE ARTHROSCOPY WITH MEDIAL MENISECTOMY Left 12/25/2016   Procedure: KNEE ARTHROSCOPY WITH MEDIAL AND LATERAL MENISECTOMY;  Surgeon: Carole Civil, MD;  Location: [facility];  Service: Orthopedics;  Laterality: Left;  . LAPAROSCOPIC TUBAL LIGATION     [facility]  . MAXIMUM ACCESS (MAS)POSTERIOR LUMBAR INTERBODY FUSION (PLIF) 2 LEVEL  08/08/2016  . NOSE SURGERY    . SHOULDER OPEN ROTATOR CUFF REPAIR Right 04/02/2013  Procedure: ROTATOR CUFF REPAIR SHOULDER OPEN;  Surgeon: Carole Civil, MD;  Location: [facility];  Service: Orthopedics;  Laterality: Right;  . THROAT SURGERY  2000   for throat cancer/no problems intubation...[facility]  . TONSILLECTOMY    . TUBAL LIGATION      Family History  Problem Relation Age of Onset  . Diabetes Mother   . Hypertension Mother   . Heart disease Maternal Grandfather        sudden death  .  Cancer Paternal Grandmother   . Cancer Paternal Grandfather   . Other Daughter        allergies; scolosis  . Cancer Paternal Uncle   . Cancer Paternal Uncle   . Cancer Paternal Uncle   . Cancer Paternal Uncle    Social History:  reports that she has been smoking cigarettes. She has a 15.00 pack-year smoking history. She has never used smokeless tobacco. She reports current alcohol use. She reports previous drug use. Drug: Marijuana.  Allergies:  Allergies  Allergen Reactions  . Bee Venom Anaphylaxis  . Morphine And Related Other (See Comments)    Severe headache  . Adhesive [Tape] Itching    Some types of adhesive tape and paper tape rip up the skin.      Medications Prior to Admission  Medication Sig Dispense Refill  . amphetamine-dextroamphetamine (ADDERALL) 30 MG tablet Take 15 mg by mouth 3 (three) times daily.     . Aspirin-Salicylamide-Caffeine (BC HEADACHE POWDER PO) Take 1 packet by mouth daily as needed (headaches).    . augmented betamethasone dipropionate (DIPROLENE-AF) 0.05 % ointment Apply 6.14 application topically 2 (two) times daily as needed (psoriasis).     . Biotin 10000 MCG TABS Take 10,000 mcg by mouth daily.     . diclofenac (VOLTAREN) 75 MG EC tablet Take 1 tablet (75 mg total) by mouth 2 (two) times daily as needed. TAKE (1) TABLET BY MOUTH TWICE DAILY WITH MEALS. (Patient taking differently: Take 75 mg by mouth daily with breakfast. ) 60 tablet 5  . fexofenadine (ALLEGRA) 180 MG tablet Take 180 mg by mouth daily.    Marland Kitchen gabapentin (NEURONTIN) 300 MG capsule 2 capsules every 6 hrs (Patient taking differently: Take 600 mg by mouth 3 (three) times daily. ) 90 capsule 5  . Hyoscyamine Sulfate SL (LEVSIN/SL) 0.125 MG SUBL Place 0.125 mg under the tongue 2 (two) times daily as needed (abd pain). 60 tablet 3  . linaclotide (LINZESS) 290 MCG CAPS capsule Take 1 capsule (290 mcg total) by mouth daily before breakfast. 30 capsule 6  . meloxicam (MOBIC) 15 MG tablet Take  15 mg by mouth daily.    . methocarbamol (ROBAXIN) 750 MG tablet Take 750 mg by mouth 2 (two) times daily.     . Oxycodone HCl 10 MG TABS Take 10 mg by mouth 4 (four) times daily.     . pantoprazole (PROTONIX) 40 MG tablet Take 1 tablet (40 mg total) by mouth 2 (two) times daily before a meal. 180 tablet 3  . tiZANidine (ZANAFLEX) 4 MG tablet Take 4 mg by mouth at bedtime.     . topiramate (TOPAMAX) 50 MG tablet Take 50 mg by mouth at bedtime.     . vitamin B-12 (CYANOCOBALAMIN) 1000 MCG tablet Take 1,000 mcg by mouth daily.    Marland Kitchen XIIDRA 5 % SOLN Place 1 drop into both eyes in the morning and at bedtime.     Marland Kitchen albuterol (PROVENTIL HFA;VENTOLIN HFA) 108 (90 Base) MCG/ACT  inhaler Inhale 2 puffs into the lungs every 6 (six) hours as needed for wheezing or shortness of breath. (Patient not taking: Reported on 05/24/2020)    . cetirizine (ZYRTEC) 10 MG tablet Take 10 mg by mouth daily. (Patient not taking: Reported on 05/24/2020)    . CHANTIX 1 MG tablet Take 1 mg by mouth 2 (two) times daily.  (Patient not taking: Reported on 05/24/2020)    . Cholecalciferol (VITAMIN D-3) 5000 units TABS Take 2,000 Units by mouth daily.  (Patient not taking: Reported on 05/24/2020)    . docusate sodium (COLACE) 100 MG capsule Take 300 mg by mouth at bedtime.  (Patient not taking: Reported on 05/24/2020)    . EPINEPHrine 0.3 mg/0.3 mL IJ SOAJ injection Inject 0.3 mg into the muscle as needed for anaphylaxis.     Marland Kitchen escitalopram (LEXAPRO) 20 MG tablet Take 20 mg by mouth at bedtime.  (Patient not taking: Reported on 05/24/2020)    . Polyethyl Glycol-Propyl Glycol (LUBRICANT EYE DROPS) 0.4-0.3 % SOLN Place 1-2 drops into both eyes 3 (three) times daily as needed (for dry eyes.). (Patient not taking: Reported on 05/24/2020)      Results for orders placed or performed during the hospital encounter of 05/29/20 (from the past 48 hour(s))  Pregnancy, urine     Status: None   Collection Time: 05/29/20  3:25 PM  Result Value  Ref Range   Preg Test, Ur NEGATIVE NEGATIVE    Comment:        THE SENSITIVITY OF THIS METHODOLOGY IS >20 mIU/mL. Performed at [facility], [address] [place], [place] [address]    No results found.  Review of Systems  Blood pressure (!) 158/69, pulse 71, temperature 99.5 F (37.5 C), temperature source Oral, resp. rate 12, height 5\' 3"  (1.6 m), SpO2 99 %. Physical Exam HENT:     Mouth/Throat:     Mouth: Mucous membranes are moist.     Pharynx: Oropharynx is clear.  Eyes:     General: No scleral icterus.    Conjunctiva/sclera: Conjunctivae normal.  Cardiovascular:     Rate and Rhythm: Normal rate and regular rhythm.     Heart sounds: Normal heart sounds. No murmur heard.   Pulmonary:     Effort: Pulmonary effort is normal.     Breath sounds: Normal breath sounds.  Abdominal:     Comments: Abdomen is full but soft and nontender with no organomegaly or masses.  Musculoskeletal:        General: No swelling.     Cervical back: Neck supple.  Lymphadenopathy:     Cervical: No cervical adenopathy.  Skin:    General: Skin is warm and dry.  Neurological:     Mental Status: She is alert.      Assessment/Plan Chronic GERD complicated by short segment Barrett's esophagus. Surveillance EGD and average rescreening colonoscopy.  Hildred Laser, MD 05/31/2020, 11:45 AM

## 2020-05-31 NOTE — Discharge Instructions (Signed)
No aspirin or NSAIDs for 24 hours. Please remember you cannot take meloxicam and diclofenac together.  Take 1 or the other. Resume other medications and diet as before. No driving for 24 hours. Physician will call with biopsy results.   Upper Endoscopy, Adult, Care After This sheet gives you information about how to care for yourself after your procedure. Your health care provider may also give you more specific instructions. If you have problems or questions, contact your health care provider. What can I expect after the procedure? After the procedure, it is common to have:  A sore throat.  Mild stomach pain or discomfort.  Bloating.  Nausea. Follow these instructions at home:   Follow instructions from your health care provider about what to eat or drink after your procedure.  Return to your normal activities as told by your health care provider. Ask your health care provider what activities are safe for you.  Take over-the-counter and prescription medicines only as told by your health care provider.  Do not drive for 24 hours if you were given a sedative during your procedure.  Keep all follow-up visits as told by your health care provider. This is important. Contact a health care provider if you have:  A sore throat that lasts longer than one day.  Trouble swallowing. Get help right away if:  You vomit blood or your vomit looks like coffee grounds.  You have: ? A fever. ? Bloody, black, or tarry stools. ? A severe sore throat or you cannot swallow. ? Difficulty breathing. ? Severe pain in your chest or abdomen. Summary  After the procedure, it is common to have a sore throat, mild stomach discomfort, bloating, and nausea.  Do not drive for 24 hours if you were given a sedative during the procedure.  Follow instructions from your health care provider about what to eat or drink after your procedure.  Return to your normal activities as told by your health care  provider. This information is not intended to replace advice given to you by your health care provider. Make sure you discuss any questions you have with your health care provider. Document Revised: 01/06/2018 Document Reviewed: 12/15/2017 Elsevier Patient Education  [place].   Colonoscopy, Adult, Care After This sheet gives you information about how to care for yourself after your procedure. Your doctor may also give you more specific instructions. If you have problems or questions, call your doctor. What can I expect after the procedure? After the procedure, it is common to have:  A small amount of blood in your poop (stool) for 24 hours.  Some gas.  Mild cramping or bloating in your belly (abdomen). Follow these instructions at home: Eating and drinking   Drink enough fluid to keep your pee (urine) pale yellow.  Follow instructions from your doctor about what you cannot eat or drink.  Return to your normal diet as told by your doctor. Avoid heavy or fried foods that are hard to digest. Activity  Rest as told by your doctor.  Do not sit for a long time without moving. Get up to take short walks every 1-2 hours. This is important. Ask for help if you feel weak or unsteady.  Return to your normal activities as told by your doctor. Ask your doctor what activities are safe for you. To help cramping and bloating:   Try walking around.  Put heat on your belly as told by your doctor. Use the heat source that your doctor  recommends, such as a moist heat pack or a heating pad. ? Put a towel between your skin and the heat source. ? Leave the heat on for 20-30 minutes. ? Remove the heat if your skin turns bright red. This is very important if you are unable to feel pain, heat, or cold. You may have a greater risk of getting burned. General instructions  For the first 24 hours after the procedure: ? Do not drive or use machinery. ? Do not sign important documents. ? Do  not drink alcohol. ? Do your daily activities more slowly than normal. ? Eat foods that are soft and easy to digest.  Take over-the-counter or prescription medicines only as told by your doctor.  Keep all follow-up visits as told by your doctor. This is important. Contact a doctor if:  You have blood in your poop 2-3 days after the procedure. Get help right away if:  You have more than a small amount of blood in your poop.  You see large clumps of tissue (blood clots) in your poop.  Your belly is swollen.  You feel like you may vomit (nauseous).  You vomit.  You have a fever.  You have belly pain that gets worse, and medicine does not help your pain. Summary  After the procedure, it is common to have a small amount of blood in your poop. You may also have mild cramping and bloating in your belly.  For the first 24 hours after the procedure, do not drive or use machinery, do not sign important documents, and do not drink alcohol.  Get help right away if you have a lot of blood in your poop, feel like you may vomit, have a fever, or have more belly pain. This information is not intended to replace advice given to you by your health care provider. Make sure you discuss any questions you have with your health care provider. Document Revised: 02/08/2019 Document Reviewed: 02/08/2019 Elsevier Patient Education  [place].   PATIENT INSTRUCTIONS POST-ANESTHESIA  IMMEDIATELY FOLLOWING SURGERY:  Do not drive or operate machinery for the first twenty four hours after surgery.  Do not make any important decisions for twenty four hours after surgery or while taking narcotic pain medications or sedatives.  If you develop intractable nausea and vomiting or a severe headache please notify your doctor immediately.  FOLLOW-UP:  Please make an appointment with your surgeon as instructed. You do not need to follow up with anesthesia unless specifically instructed to do so.  WOUND  CARE INSTRUCTIONS (if applicable):  Keep a dry clean dressing on the anesthesia/puncture wound site if there is drainage.  Once the wound has quit draining you may leave it open to air.  Generally you should leave the bandage intact for twenty four hours unless there is drainage.  If the epidural site drains for more than 36-48 hours please call the anesthesia department.  QUESTIONS?:  Please feel free to call your physician or the hospital operator if you have any questions, and they will be happy to assist you.

## 2020-05-31 NOTE — Op Note (Signed)
[facility] Patient Name: [name] Procedure Date: 05/31/2020 12:28 PM MRN: [id number] Date of Birth: [birth year] Attending MD: Hildred Laser , MD CSN: [id number] Age: 54 Admit Type: Outpatient Procedure:                Colonoscopy Indications:              Screening for colorectal malignant neoplasm Providers:                Hildred Laser, MD, Caprice Kluver, Kristine L. Risa Grill, Technician, Aram Candela Referring MD:             Lucia Gaskins, MD Medicines:                Propofol per Anesthesia Complications:            No immediate complications. Estimated Blood Loss:     Estimated blood loss: none. Procedure:                Pre-Anesthesia Assessment:                           - Prior to the procedure, a History and Physical                            was performed, and patient medications and                            allergies were reviewed. The patient's tolerance of                            previous anesthesia was also reviewed. The risks                            and benefits of the procedure and the sedation                            options and risks were discussed with the patient.                            All questions were answered, and informed consent                            was obtained. Prior Anticoagulants: The patient has                            taken no previous anticoagulant or antiplatelet                            agents except for NSAID medication. ASA Grade                            Assessment: III - A patient with severe systemic  disease. After reviewing the risks and benefits,                            the patient was deemed in satisfactory condition to                            undergo the procedure.                           After obtaining informed consent, the colonoscope                            was passed under direct vision. Throughout the                             procedure, the patient's blood pressure, pulse, and                            oxygen saturations were monitored continuously. The                            [id number]-[id number]) was introduced through the anus                            and advanced to the the cecum, identified by                            appendiceal orifice and ileocecal valve. The                            colonoscopy was performed without difficulty. The                            patient tolerated the procedure well. The quality                            of the bowel preparation was adequate to identify                            polyps 6 mm and larger in size. The ileocecal                            valve, appendiceal orifice, and rectum were                            photographed. Scope In: 12:31:21 PM Scope Out: 12:51:24 PM Scope Withdrawal Time: 0 hours 12 minutes 27 seconds  Total Procedure Duration: 0 hours 20 minutes 3 seconds  Findings:      The perianal and digital rectal examinations were normal.      The colon (entire examined portion) appeared normal.      External hemorrhoids were found during retroflexion. The hemorrhoids       were small. Impression:               - The entire examined  colon is normal.                           - External hemorrhoids.                           - No specimens collected. Moderate Sedation:      Per Anesthesia Care Recommendation:           - Patient has a contact number available for                            emergencies. The signs and symptoms of potential                            delayed complications were discussed with the                            patient. Return to normal activities tomorrow.                            Written discharge instructions were provided to the                            patient.                           - Resume previous diet today.                           - Continue present medications.                           - Repeat  colonoscopy in 10 years for screening                            purposes. Procedure Code(s):        --- Professional ---                           [phone], Colonoscopy, flexible; diagnostic, including                            collection of specimen(s) by brushing or washing,                            when performed (separate procedure) Diagnosis Code(s):        --- Professional ---                           Z12.11, Encounter for screening for malignant                            neoplasm of colon                           K64.4, Residual hemorrhoidal skin tags CPT copyright 2019 American Medical Association. All rights reserved. The  codes documented in this report are preliminary and upon coder review may  be revised to meet current compliance requirements. Hildred Laser, MD Hildred Laser, MD 05/31/2020 1:05:41 PM This report has been signed electronically. Number of Addenda: 0

## 2020-05-31 NOTE — Anesthesia Preprocedure Evaluation (Signed)
Anesthesia Evaluation  Patient identified by MRN, date of birth, ID band Patient awake    Reviewed: Allergy & Precautions, H&P , NPO status , Patient's Chart, lab work & pertinent test results, reviewed documented beta blocker date and time   Airway Mallampati: II  TM Distance: >3 FB Neck ROM: full    Dental no notable dental hx.    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  Nop current issues.  Reproductive/Obstetrics negative OB ROS                             Anesthesia Physical Anesthesia Plan  ASA: III  Anesthesia Plan: General   Post-op Pain Management:    Induction:   PONV Risk Score and Plan:   Airway Management Planned:   Additional Equipment:   Intra-op Plan:   Post-operative Plan:   Informed Consent: I have reviewed the patients History and Physical, chart, labs and discussed the procedure including the risks, benefits and alternatives for the proposed anesthesia with the patient or authorized representative who has indicated his/her understanding and acceptance.     Dental Advisory Given  Plan Discussed with: CRNA  Anesthesia Plan Comments:         Anesthesia Quick Evaluation

## 2020-05-31 NOTE — Op Note (Signed)
[facility] Patient Name: [name] Procedure Date: 05/31/2020 11:32 AM MRN: [id number] Date of Birth: 01/14/1966 Attending MD: Hildred Laser , MD CSN: [id number] Age: 54 Admit Type: Outpatient Procedure:                Upper GI endoscopy Indications:              Barrett's esophagus, Follow-up of Barrett's                            esophagus Providers:                Hildred Laser, MD, Caprice Kluver, Kristine L. Risa Grill, Technician, Aram Candela Referring MD:             Lucia Gaskins MD Medicines:                Propofol per Anesthesia Complications:            No immediate complications. Estimated Blood Loss:     Estimated blood loss was minimal. Procedure:                Pre-Anesthesia Assessment:                           - Prior to the procedure, a History and Physical                            was performed, and patient medications and                            allergies were reviewed. The patient's tolerance of                            previous anesthesia was also reviewed. The risks                            and benefits of the procedure and the sedation                            options and risks were discussed with the patient.                            All questions were answered, and informed consent                            was obtained. Prior Anticoagulants: The patient has                            taken no previous anticoagulant or antiplatelet                            agents except for NSAID medication. ASA Grade  Assessment: III - A patient with severe systemic                            disease. After reviewing the risks and benefits,                            the patient was deemed in satisfactory condition to                            undergo the procedure.                           After obtaining informed consent, the endoscope was                            passed under direct  vision. Throughout the                            procedure, the patient's blood pressure, pulse, and                            oxygen saturations were monitored continuously. The                            [id number] ([id number]) was introduced through the                            mouth, and advanced to the second part of duodenum.                            The upper GI endoscopy was accomplished without                            difficulty. The patient tolerated the procedure                            well. Scope In: 12:20:09 PM Scope Out: 12:27:27 PM Total Procedure Duration: 0 hours 7 minutes 18 seconds  Findings:      The hypopharynx was normal.      The proximal esophagus and mid esophagus were normal.      There were esophageal mucosal changes consistent with short-segment       Barrett's esophagus, secondary to established short-segment Barrett's       disease present in the distal esophagus. The maximum longitudinal extent       of these mucosal changes was 1 cm in length. Mucosa was biopsied with a       cold forceps for histology. One specimen bottle was sent to pathology.       The pathology specimen was placed into Bottle Number 1.      The Z-line was irregular and was found 33 cm from the incisors.      A 2 cm hiatal hernia was present.      Patchy mild inflammation characterized by congestion (edema), erosions       and erythema was found in the gastric antrum.  The exam of the stomach was otherwise normal.      The duodenal bulb and second portion of the duodenum were normal. Impression:               - Normal hypopharynx.                           - Normal proximal esophagus and mid esophagus.                           - Esophageal mucosal changes consistent with                            short-segment Barrett's esophagus, secondary to                            established short-segment Barrett's disease. Single                            small patch.  Biopsied.                           - Z-line irregular, 33 cm from the incisors.                           - 2 cm hiatal hernia.                           - Gastritis.                           - Normal duodenal bulb and second portion of the                            duodenum. Moderate Sedation:      Per Anesthesia Care Recommendation:           - Patient has a contact number available for                            emergencies. The signs and symptoms of potential                            delayed complications were discussed with the                            patient. Return to normal activities tomorrow.                            Written discharge instructions were provided to the                            patient.                           - Resume previous diet today.                           -  Continue present medications.                           - No aspirin, ibuprofen, naproxen, or other                            non-steroidal anti-inflammatory drugs for 1 day.                           - Await pathology results.                           - Perform an H. pylori serology today. Procedure Code(s):        --- Professional ---                           [phone], Esophagogastroduodenoscopy, flexible,                            transoral; with biopsy, single or multiple Diagnosis Code(s):        --- Professional ---                           K22.70, Barrett's esophagus without dysplasia                           K22.8, Other specified diseases of esophagus                           K44.9, Diaphragmatic hernia without obstruction or                            gangrene                           K29.70, Gastritis, unspecified, without bleeding CPT copyright 2019 American Medical Association. All rights reserved. The codes documented in this report are preliminary and upon coder review may  be revised to meet current compliance requirements. Hildred Laser, MD Hildred Laser,  MD 05/31/2020 1:03:16 PM This report has been signed electronically. Number of Addenda: 0

## 2020-06-01 LAB — SURGICAL PATHOLOGY

## 2020-06-02 LAB — H. PYLORI ANTIBODY, IGG: H Pylori IgG: 0.96 Index Value — ABNORMAL HIGH (ref 0.00–0.79)

## 2020-06-06 ENCOUNTER — Encounter (HOSPITAL_COMMUNITY): Payer: Self-pay | Admitting: Internal Medicine

## 2020-06-26 ENCOUNTER — Other Ambulatory Visit: Payer: Self-pay

## 2020-06-26 ENCOUNTER — Ambulatory Visit (INDEPENDENT_AMBULATORY_CARE_PROVIDER_SITE_OTHER): Payer: Medicare Other | Admitting: Orthopedic Surgery

## 2020-06-26 ENCOUNTER — Encounter: Payer: Self-pay | Admitting: Orthopedic Surgery

## 2020-06-26 DIAGNOSIS — M25462 Effusion, left knee: Secondary | ICD-10-CM | POA: Diagnosis not present

## 2020-06-26 DIAGNOSIS — G8929 Other chronic pain: Secondary | ICD-10-CM

## 2020-06-26 DIAGNOSIS — M1712 Unilateral primary osteoarthritis, left knee: Secondary | ICD-10-CM | POA: Diagnosis not present

## 2020-06-26 DIAGNOSIS — Z6841 Body Mass Index (BMI) 40.0 and over, adult: Secondary | ICD-10-CM

## 2020-06-26 NOTE — Patient Instructions (Signed)

## 2020-06-26 NOTE — Progress Notes (Signed)
Chief Complaint  Patient presents with  . Knee Pain    swelling /painful left knee    Encounter Diagnoses  Name Primary?  . Morbid obesity ([place]) Yes  . Body mass index 45.0-49.9, adult ([place])   . Chronic pain of left knee   . Effusion of left knee   . Primary osteoarthritis of left knee     Procedure note injection and aspiration left knee joint  Verbal consent was obtained to aspirate and inject the left knee joint   Timeout was completed to confirm the site of aspiration and injection  An 18-gauge needle was used to aspirate the left knee joint from a suprapatellar lateral approach.  The medications used were 40 mg of Depo-Medrol and 1% lidocaine 3 cc  Anesthesia was provided by ethyl chloride and the skin was prepped with alcohol.  After cleaning the skin with alcohol an 18-gauge needle was used to aspirate the right knee joint.  We obtained 35cc of fluid clear  We followed this by injection of 40 mg of Depo-Medrol and 3 cc 1% lidocaine.  There were no complications. A sterile bandage was applied.

## 2020-07-26 ENCOUNTER — Encounter: Payer: Self-pay | Admitting: Orthopedic Surgery

## 2020-07-26 ENCOUNTER — Other Ambulatory Visit: Payer: Self-pay

## 2020-07-26 ENCOUNTER — Ambulatory Visit (INDEPENDENT_AMBULATORY_CARE_PROVIDER_SITE_OTHER): Payer: Medicare Other | Admitting: Orthopedic Surgery

## 2020-07-26 VITALS — BP 157/78 | HR 76 | Ht 63.0 in | Wt 255.0 lb

## 2020-07-26 DIAGNOSIS — M25562 Pain in left knee: Secondary | ICD-10-CM | POA: Diagnosis not present

## 2020-07-26 DIAGNOSIS — G8929 Other chronic pain: Secondary | ICD-10-CM

## 2020-07-26 NOTE — Progress Notes (Signed)
Encounter Diagnosis  Name Primary?  . Chronic pain of left knee Yes    Chief Complaint  Patient presents with  . Knee Pain    left    Attempted aspiration left knee joint no fluid this time injected with cortisone  Procedure note left knee injection   verbal consent was obtained to inject left knee joint  Timeout was completed to confirm the site of injection  The medications used were 40 mg of Depo-Medrol and 1% lidocaine 3 cc  Anesthesia was provided by ethyl chloride and the skin was prepped with alcohol.  After cleaning the skin with alcohol a 20-gauge needle was used to inject the left knee joint. There were no complications. A sterile bandage was applied.

## 2020-08-16 ENCOUNTER — Other Ambulatory Visit: Payer: Self-pay | Admitting: Orthopedic Surgery

## 2020-08-16 DIAGNOSIS — M25462 Effusion, left knee: Secondary | ICD-10-CM

## 2020-08-23 ENCOUNTER — Ambulatory Visit (INDEPENDENT_AMBULATORY_CARE_PROVIDER_SITE_OTHER): Payer: Medicare Other | Admitting: Orthopedic Surgery

## 2020-08-23 ENCOUNTER — Other Ambulatory Visit: Payer: Self-pay

## 2020-08-23 DIAGNOSIS — M25562 Pain in left knee: Secondary | ICD-10-CM | POA: Diagnosis not present

## 2020-08-23 DIAGNOSIS — G8929 Other chronic pain: Secondary | ICD-10-CM

## 2020-08-23 DIAGNOSIS — Z6841 Body Mass Index (BMI) 40.0 and over, adult: Secondary | ICD-10-CM

## 2020-08-23 DIAGNOSIS — M25561 Pain in right knee: Secondary | ICD-10-CM

## 2020-08-23 NOTE — Progress Notes (Signed)
Chief Complaint  Patient presents with  . Knee Pain    Bilateral/ hurting real bad./Wants injection    2 injections today requested   Procedure note for bilateral knee injections  Procedure note left knee injection verbal consent was obtained to inject left knee joint  Timeout was completed to confirm the site of injection  The medications used were 40 mg of Depo-Medrol and 1% lidocaine 3 cc  Anesthesia was provided by ethyl chloride and the skin was prepped with alcohol.  After cleaning the skin with alcohol a 20-gauge needle was used to inject the left knee joint. There were no complications. A sterile bandage was applied.   Procedure note right knee injection verbal consent was obtained to inject right knee joint  Timeout was completed to confirm the site of injection  The medications used were 40 mg of Depo-Medrol and 1% lidocaine 3 cc  Anesthesia was provided by ethyl chloride and the skin was prepped with alcohol.  After cleaning the skin with alcohol a 20-gauge needle was used to inject the right knee joint. There were no complications. A sterile bandage was applied.  Encounter Diagnoses  Name Primary?  . Morbid obesity ([place]) Yes  . Body mass index 45.0-49.9, adult ([place])   . Chronic pain of left knee   . Chronic pain of right knee

## 2020-08-23 NOTE — Patient Instructions (Addendum)
You have received an injection of steroids into the joint. 15% of patients will have increased pain within the 24 hours postinjection.   This is transient and will go away.   We recommend that you use ice packs on the injection site for 20 minutes every 2 hours and extra strength Tylenol 2 tablets every 8 as needed until the pain resolves.  If you continue to have pain after taking the Tylenol and using the ice please call the office for further instructions.   Smoking Tobacco Information, Adult Smoking tobacco can be harmful to your health. Tobacco contains a poisonous (toxic), colorless chemical called nicotine. Nicotine is addictive. It changes the brain and can make it hard to stop smoking. Tobacco also has other toxic chemicals that can hurt your body and raise your risk of many cancers. How can smoking tobacco affect me? Smoking tobacco puts you at risk for:  Cancer. Smoking is most commonly associated with lung cancer, but can also lead to cancer in other parts of the body.  Chronic obstructive pulmonary disease (COPD). This is a long-term lung condition that makes it hard to breathe. It also gets worse over time.  High blood pressure (hypertension), heart disease, stroke, or heart attack.  Lung infections, such as pneumonia.  Cataracts. This is when the lenses in the eyes become clouded.  Digestive problems. This may include peptic ulcers, heartburn, and gastroesophageal reflux disease (GERD).  Oral health problems, such as gum disease and tooth loss.  Loss of taste and smell. Smoking can affect your appearance by causing:  Wrinkles.  Yellow or stained teeth, fingers, and fingernails. Smoking tobacco can also affect your social life, because:  It may be challenging to find places to smoke when away from home. Many workplaces, [employer], hotels, and public places are tobacco-free.  Smoking is expensive. This is due to the cost of tobacco and the long-term costs of  treating health problems from smoking.  Secondhand smoke may affect those around you. Secondhand smoke can cause lung cancer, breathing problems, and heart disease. Children of smokers have a higher risk for: ? Sudden infant death syndrome (SIDS). ? Ear infections. ? Lung infections. If you currently smoke tobacco, quitting now can help you:  Lead a longer and healthier life.  Look, smell, breathe, and feel better over time.  Save money.  Protect others from the harms of secondhand smoke. What actions can I take to prevent health problems? Quit smoking  Do not start smoking. Quit if you already do.  Make a plan to quit smoking and commit to it. Look for programs to help you and ask your health care provider for recommendations and ideas.  Set a date and write down all the reasons you want to quit.  Let your friends and family know you are quitting so they can help and support you. Consider finding friends who also want to quit. It can be easier to quit with someone else, so that you can support each other.  Talk with your health care provider about using nicotine replacement medicines to help you quit, such as gum, lozenges, patches, sprays, or pills.  Do not replace cigarette smoking with electronic cigarettes, which are commonly called e-cigarettes. The safety of e-cigarettes is not known, and some may contain harmful chemicals.  If you try to quit but return to smoking, stay positive. It is common to slip up when you first quit, so take it one day at a time.  Be prepared for cravings. When  you feel the urge to smoke, chew gum or suck on hard candy.   Lifestyle  Stay busy and take care of your body.  Drink enough fluid to keep your urine pale yellow.  Get plenty of exercise and eat a healthy diet. This can help prevent weight gain after quitting.  Monitor your eating habits. Quitting smoking can cause you to have a larger appetite than when you smoke.  Find ways to relax.  Go out with friends or family to a movie or a restaurant where people do not smoke.  Ask your health care provider about having regular tests (screenings) to check for cancer. This may include blood tests, imaging tests, and other tests.  Find ways to manage your stress, such as meditation, yoga, or exercise. Where to find support To get support to quit smoking, consider:  Asking your health care provider for more information and resources.  Taking classes to learn more about quitting smoking.  Looking for local organizations that offer resources about quitting smoking.  Joining a support group for people who want to quit smoking in your local community.  Calling the smokefree.gov counselor helpline: 1-800-Quit-Now 787-129-3619) Where to find more information You may find more information about quitting smoking from:  HelpGuide.org: www.helpguide.org  https://hall.com/: smokefree.gov  American Lung Association: www.lung.org Contact a health care provider if you:  Have problems breathing.  Notice that your lips, nose, or fingers turn blue.  Have chest pain.  Are coughing up blood.  Feel faint or you pass out.  Have other health changes that cause you to worry. Summary  Smoking tobacco can negatively affect your health, the health of those around you, your finances, and your social life.  Do not start smoking. Quit if you already do. If you need help quitting, ask your health care provider.  Think about joining a support group for people who want to quit smoking in your local community. There are many effective programs that will help you to quit this behavior. This information is not intended to replace advice given to you by your health care provider. Make sure you discuss any questions you have with your health care provider. Document Revised: 04/09/2019 Document Reviewed: 07/30/2016 Elsevier Patient Education  2021 [employer].

## 2020-09-21 ENCOUNTER — Ambulatory Visit (INDEPENDENT_AMBULATORY_CARE_PROVIDER_SITE_OTHER): Payer: Medicare Other | Admitting: Orthopedic Surgery

## 2020-09-21 ENCOUNTER — Encounter: Payer: Self-pay | Admitting: Orthopedic Surgery

## 2020-09-21 ENCOUNTER — Other Ambulatory Visit: Payer: Self-pay

## 2020-09-21 DIAGNOSIS — M25562 Pain in left knee: Secondary | ICD-10-CM | POA: Diagnosis not present

## 2020-09-21 DIAGNOSIS — M25462 Effusion, left knee: Secondary | ICD-10-CM

## 2020-09-21 DIAGNOSIS — G8929 Other chronic pain: Secondary | ICD-10-CM

## 2020-09-21 DIAGNOSIS — Z6841 Body Mass Index (BMI) 40.0 and over, adult: Secondary | ICD-10-CM

## 2020-09-21 NOTE — Progress Notes (Signed)
Chief Complaint  Patient presents with  . Knee Pain    Left knee pain.   [name] has chronic left knee pain, she is not a surgical candidate.  She gets injections and aspirations as needed  Comes in today complaining of pain and swelling left knee  There are no signs of infection A steroid injection was performed at left knee joint using 1% plain Lidocaine and six mg of Celestone. This was well tolerated.+ aspiration  Encounter Diagnoses  Name Primary?  . Morbid obesity ([place]) Yes  . Body mass index 45.0-49.9, adult ([place])   . Chronic pain of left knee   . Effusion of left knee    Follow-up in a month

## 2020-10-19 ENCOUNTER — Ambulatory Visit (INDEPENDENT_AMBULATORY_CARE_PROVIDER_SITE_OTHER): Payer: Medicare Other | Admitting: Orthopedic Surgery

## 2020-10-19 ENCOUNTER — Other Ambulatory Visit: Payer: Self-pay

## 2020-10-19 DIAGNOSIS — M25562 Pain in left knee: Secondary | ICD-10-CM

## 2020-10-19 DIAGNOSIS — M25462 Effusion, left knee: Secondary | ICD-10-CM | POA: Diagnosis not present

## 2020-10-19 DIAGNOSIS — G8929 Other chronic pain: Secondary | ICD-10-CM

## 2020-10-19 DIAGNOSIS — Z6841 Body Mass Index (BMI) 40.0 and over, adult: Secondary | ICD-10-CM

## 2020-10-19 NOTE — Progress Notes (Signed)
Chief Complaint  Patient presents with  . Knee Pain    Left     Encounter Diagnoses  Name Primary?  . Morbid obesity ([place]) Yes  . Body mass index 45.0-49.9, adult ([place])   . Chronic pain of left knee   . Effusion of left knee     Aspiration injection left knee   A steroid injection was performed at left knee after aspiration of the 17 cc clear fluid  using 1% plain Lidocaine and 6 mg of Celestone. This was well tolerated.

## 2020-11-14 ENCOUNTER — Other Ambulatory Visit: Payer: Self-pay | Admitting: Orthopedic Surgery

## 2020-11-14 DIAGNOSIS — M5410 Radiculopathy, site unspecified: Secondary | ICD-10-CM

## 2020-11-14 NOTE — Telephone Encounter (Signed)
Rx request 

## 2020-11-20 ENCOUNTER — Other Ambulatory Visit: Payer: Self-pay

## 2020-11-20 ENCOUNTER — Ambulatory Visit (INDEPENDENT_AMBULATORY_CARE_PROVIDER_SITE_OTHER): Payer: Medicare Other | Admitting: Orthopedic Surgery

## 2020-11-20 ENCOUNTER — Encounter: Payer: Self-pay | Admitting: Orthopedic Surgery

## 2020-11-20 DIAGNOSIS — M7121 Synovial cyst of popliteal space [Baker], right knee: Secondary | ICD-10-CM | POA: Diagnosis not present

## 2020-11-20 DIAGNOSIS — Z6841 Body Mass Index (BMI) 40.0 and over, adult: Secondary | ICD-10-CM

## 2020-11-20 DIAGNOSIS — M25561 Pain in right knee: Secondary | ICD-10-CM

## 2020-11-20 DIAGNOSIS — M25562 Pain in left knee: Secondary | ICD-10-CM | POA: Diagnosis not present

## 2020-11-20 DIAGNOSIS — M25462 Effusion, left knee: Secondary | ICD-10-CM

## 2020-11-20 DIAGNOSIS — G8929 Other chronic pain: Secondary | ICD-10-CM

## 2020-11-20 NOTE — Progress Notes (Signed)
Chief Complaint  Patient presents with  . Knee Pain    Patient reports left knee is really bad today.    Also c/o right knee posterior knee pain    Is 55 years old she has chronic right left knee pain came in today for her usual 1 month appointment for aspiration injection left knee, she is not a surgical candidate due to unrelenting back pain and chronic lumbar spine condition  She also complains of pain in the posterior aspect of her right knee with swelling which is worse at night  Examination of the right knee shows that she has tenderness in the popliteal fossa with a swollen Baker's cyst.  Procedure note injection and aspiration left knee joint  Verbal consent was obtained to aspirate and inject the left knee joint   Timeout was completed to confirm the site of aspiration and injection  An 18-gauge needle was used to aspirate the left knee joint from a suprapatellar lateral approach.  Anesthesia was provided by ethyl chloride and the skin was prepped with alcohol.  After cleaning the skin with alcohol an 18-gauge needle was used to aspirate the right knee joint.  We obtained 15 cc of fluid clear   We followed this by injection of celestone 6mg   and 3 cc 1% lidocaine.  There were no complications. A sterile bandage was applied.   Encounter Diagnoses  Name Primary?  . Morbid obesity ([place]) Yes  . Body mass index 45.0-49.9, adult ([place])   . Chronic pain of left knee   . Effusion of left knee   . Chronic pain of right knee   . Bakers cyst, right      Recommend ultrasound and aspiration right Baker's cyst

## 2020-11-29 ENCOUNTER — Other Ambulatory Visit: Payer: Medicare Other

## 2020-12-07 ENCOUNTER — Ambulatory Visit
Admission: RE | Admit: 2020-12-07 | Discharge: 2020-12-07 | Disposition: A | Discharge status: Home / Self Care | Payer: Medicare Other | Source: Ambulatory Visit | Attending: Orthopedic Surgery | Admitting: Orthopedic Surgery

## 2020-12-18 ENCOUNTER — Other Ambulatory Visit: Payer: Self-pay

## 2020-12-18 ENCOUNTER — Encounter: Payer: Self-pay | Admitting: Orthopedic Surgery

## 2020-12-18 ENCOUNTER — Ambulatory Visit (INDEPENDENT_AMBULATORY_CARE_PROVIDER_SITE_OTHER): Payer: Medicare Other | Admitting: Orthopedic Surgery

## 2020-12-18 VITALS — BP 186/85 | HR 71 | Ht 63.0 in | Wt 249.0 lb

## 2020-12-18 DIAGNOSIS — M5410 Radiculopathy, site unspecified: Secondary | ICD-10-CM

## 2020-12-18 DIAGNOSIS — Z6841 Body Mass Index (BMI) 40.0 and over, adult: Secondary | ICD-10-CM

## 2020-12-18 DIAGNOSIS — M25562 Pain in left knee: Secondary | ICD-10-CM

## 2020-12-18 DIAGNOSIS — G8929 Other chronic pain: Secondary | ICD-10-CM | POA: Diagnosis not present

## 2020-12-18 MED ORDER — GABAPENTIN 800 MG PO TABS: 800.0000 mg | ORAL_TABLET | Freq: Three times a day (TID) | ORAL | 2 refills | Status: DC

## 2020-12-18 NOTE — Progress Notes (Signed)
Chief Complaint  Patient presents with  . Knee Pain    Knee pain, patient reports that right knee pain is worse.    Encounter Diagnoses  Name Primary?  . Radicular leg pain Yes  . Chronic pain of left knee   . Morbid obesity ([place])   . Body mass index 45.0-49.9, adult [facility])    The patient meets the AMA guidelines for Morbid (severe) obesity with a BMI > 40.0 and I have recommended weight loss.  Ettel comes in today quite upset she is concerned about the pain she is having in her left and right knee  She wants to consider upping her gabapentin to 800 mg 3 times a day from 600 mg 3 times a day  She says that she does not want to have the back done until is absolutely necessary and compliance with her doctors recommendations  However, she has obesity, she is on oxycodone, she has arthritis of the left and right knee and I have told her that in this setting I have only seen knee failures from total knees.  Most patients present back with a stiff painful knee and are worse functionally than they were before the knee surgery   BP (!) 186/85   Pulse 71   Ht 5\' 3"  (1.6 m)   Wt 249 lb (112.9 kg)   BMI 44.11 kg/m   We did aspirate the left knee 23 cc of fluid clear fluid from a lateral approach superior to the patella we injected Celestone 6 mg and 2 cc 1% lidocaine  I convinced her to get a second opinion to confirm that knee surgery should not be done before the back surgery and before weight loss and better pain management off OxyContin  Procedure note Aspiration injection left knee Consent was verbal Timeout was performed confirmation left knee Medication Celestone 6 mg and lidocaine 1% 2 cc Superolateral approach Ethyl chloride and alcohol were used to prep the skin 21-gauge needle injected the medication after 18-gauge needle aspiration No complications  Encounter Diagnoses  Name Primary?  . Radicular leg pain Yes  . Chronic pain of left knee   . Morbid obesity ([place])   .  Body mass index 45.0-49.9, adult ([place])

## 2021-01-05 ENCOUNTER — Other Ambulatory Visit: Payer: Self-pay | Admitting: Physician Assistant

## 2021-01-09 ENCOUNTER — Telehealth: Payer: Self-pay | Admitting: Orthopedic Surgery

## 2021-01-09 DIAGNOSIS — M5410 Radiculopathy, site unspecified: Secondary | ICD-10-CM

## 2021-01-09 NOTE — Telephone Encounter (Signed)
Call from patient, immediately following appointment per referral to Dr Mayer Camel. States that gel injections were discussed and recommended. Patient is asking if Dr Aline Brochure would proceed if he agrees. Patient also aware of upcoming scheduled appointment Monday, 01/15/21, and is asking if the approval may be done in time for this appointment?  Please advise.

## 2021-01-11 NOTE — Telephone Encounter (Signed)
She wants the left knee synvisc Dr Mayer Camel told her quit smoking get BMI to 40 and try hyaluronic acid  She has said the gabapentin was only sent in for 20 d supply she needs 30 day supply

## 2021-01-11 NOTE — Telephone Encounter (Signed)
She only needs left

## 2021-01-15 ENCOUNTER — Other Ambulatory Visit (INDEPENDENT_AMBULATORY_CARE_PROVIDER_SITE_OTHER): Payer: Self-pay

## 2021-01-15 ENCOUNTER — Telehealth: Payer: Self-pay | Admitting: Orthopedic Surgery

## 2021-01-15 ENCOUNTER — Ambulatory Visit (INDEPENDENT_AMBULATORY_CARE_PROVIDER_SITE_OTHER): Payer: Medicare Other | Admitting: Orthopedic Surgery

## 2021-01-15 ENCOUNTER — Encounter: Payer: Self-pay | Admitting: Orthopedic Surgery

## 2021-01-15 ENCOUNTER — Other Ambulatory Visit: Payer: Self-pay

## 2021-01-15 DIAGNOSIS — M25462 Effusion, left knee: Secondary | ICD-10-CM

## 2021-01-15 DIAGNOSIS — M5410 Radiculopathy, site unspecified: Secondary | ICD-10-CM

## 2021-01-15 DIAGNOSIS — K581 Irritable bowel syndrome with constipation: Secondary | ICD-10-CM

## 2021-01-15 MED ORDER — GABAPENTIN 800 MG PO TABS: 800.0000 mg | ORAL_TABLET | Freq: Three times a day (TID) | ORAL | 0 refills | Status: DC

## 2021-01-15 MED ORDER — LINACLOTIDE 290 MCG PO CAPS: 290.0000 ug | ORAL_CAPSULE | Freq: Every day | ORAL | 3 refills | Status: DC

## 2021-01-15 NOTE — Progress Notes (Signed)
Chief Complaint  Patient presents with   Knee Pain    Left wants aspiration no injection   ASKED FOR ASPIRATION AND NO INJECTION LEFT KNEE   PROCEDURE: ASPIRATE LEFT KNEE   Consent given for aspiration left knee  Left knee was aspirated from a lateral approach confirmFOLLOW UP   Left knee: surgical aspiration site  Aspirated 22 cc of clear yellow fluid  Patient is waiting for Synvisc injections  Meds ordered this encounter  Medications   gabapentin (NEURONTIN) 800 MG tablet    Sig: Take 1 tablet (800 mg total) by mouth 3 (three) times daily.    Dispense:  90 tablet    Refill:  0

## 2021-01-15 NOTE — Patient Instructions (Signed)
WAIT FOR SYNVISC

## 2021-01-15 NOTE — Telephone Encounter (Signed)
Patient called and states when Dr. Aline Brochure sent her to [facility] for her knee replacement she didn't see Dr. Mayer Camel.   She saw Harolyn Rutherford.  She was just wanting to let you know who she saw.

## 2021-01-15 NOTE — Progress Notes (Signed)
Medication sent.

## 2021-01-16 ENCOUNTER — Other Ambulatory Visit: Payer: Self-pay | Admitting: Orthopedic Surgery

## 2021-01-16 DIAGNOSIS — M1712 Unilateral primary osteoarthritis, left knee: Secondary | ICD-10-CM

## 2021-01-30 ENCOUNTER — Other Ambulatory Visit (HOSPITAL_COMMUNITY): Payer: Self-pay | Admitting: Neurosurgery

## 2021-01-30 DIAGNOSIS — G8929 Other chronic pain: Secondary | ICD-10-CM

## 2021-02-12 ENCOUNTER — Other Ambulatory Visit: Payer: Self-pay

## 2021-02-12 ENCOUNTER — Ambulatory Visit (INDEPENDENT_AMBULATORY_CARE_PROVIDER_SITE_OTHER): Payer: Medicare Other | Admitting: Orthopedic Surgery

## 2021-02-12 DIAGNOSIS — M25462 Effusion, left knee: Secondary | ICD-10-CM

## 2021-02-12 DIAGNOSIS — Z6841 Body Mass Index (BMI) 40.0 and over, adult: Secondary | ICD-10-CM

## 2021-02-12 DIAGNOSIS — M25562 Pain in left knee: Secondary | ICD-10-CM

## 2021-02-12 DIAGNOSIS — M1712 Unilateral primary osteoarthritis, left knee: Secondary | ICD-10-CM | POA: Diagnosis not present

## 2021-02-12 DIAGNOSIS — G8929 Other chronic pain: Secondary | ICD-10-CM

## 2021-02-12 NOTE — Progress Notes (Signed)
Chief Complaint  Patient presents with   Injections    Synvisc #1 LT knee   Encounter Diagnoses  Name Primary?   Primary osteoarthritis of left knee Yes   Chronic pain of left knee    Morbid obesity ([place])    Body mass index 45.0-49.9, adult ([place])    Effusion of left knee     [name] is here for the first Synvisc injection left knee  She also required aspiration of the left knee 20 cc of fluid and then 1 vial of Synvisc was injected  Procedure note Synvisc injection #1 left knee Verbal consent was obtained site was confirmed  Knee was aspirated through a lateral approach 18-gauge needle we got out 20 cc of fluid  We then injected Synvisc 1 vial  Plan follow-up 1 week injection #2 left knee

## 2021-02-12 NOTE — Telephone Encounter (Signed)
Noted. Done.

## 2021-02-13 ENCOUNTER — Ambulatory Visit (HOSPITAL_COMMUNITY)
Admission: RE | Admit: 2021-02-13 | Discharge: 2021-02-13 | Disposition: A | Discharge status: Home / Self Care | Payer: Medicare Other | Source: Ambulatory Visit | Attending: Neurosurgery | Admitting: Neurosurgery

## 2021-02-13 DIAGNOSIS — G8929 Other chronic pain: Secondary | ICD-10-CM | POA: Diagnosis present

## 2021-02-13 DIAGNOSIS — M5441 Lumbago with sciatica, right side: Secondary | ICD-10-CM | POA: Diagnosis not present

## 2021-02-13 MED ORDER — GADOBUTROL 1 MMOL/ML IV SOLN
10.0000 mL | Freq: Once | INTRAVENOUS | Status: AC | PRN
  Administered 2021-02-13: 10 mL via INTRAVENOUS

## 2021-02-19 ENCOUNTER — Other Ambulatory Visit: Payer: Self-pay

## 2021-02-19 ENCOUNTER — Ambulatory Visit (INDEPENDENT_AMBULATORY_CARE_PROVIDER_SITE_OTHER): Payer: Medicare Other | Admitting: Orthopedic Surgery

## 2021-02-19 DIAGNOSIS — M1712 Unilateral primary osteoarthritis, left knee: Secondary | ICD-10-CM

## 2021-02-19 DIAGNOSIS — G8929 Other chronic pain: Secondary | ICD-10-CM

## 2021-02-19 NOTE — Progress Notes (Signed)
Chief Complaint  Patient presents with   Primary osteoarthritis of left knee   Injections    Synvisc Injection #2 left knee    [name] comes in for the second Synvisc injection of the left knee  Timeout verbal consent  Alcohol and ethyl chloride used to prepare the left knee  Injection performed the lateral compartment  Come back in a week for third injection

## 2021-02-26 ENCOUNTER — Other Ambulatory Visit: Payer: Self-pay

## 2021-02-26 ENCOUNTER — Encounter: Payer: Self-pay | Admitting: Orthopedic Surgery

## 2021-02-26 ENCOUNTER — Ambulatory Visit (INDEPENDENT_AMBULATORY_CARE_PROVIDER_SITE_OTHER): Payer: Medicare Other | Admitting: Orthopedic Surgery

## 2021-02-26 DIAGNOSIS — G8929 Other chronic pain: Secondary | ICD-10-CM

## 2021-02-26 DIAGNOSIS — M1712 Unilateral primary osteoarthritis, left knee: Secondary | ICD-10-CM | POA: Diagnosis not present

## 2021-02-26 NOTE — Patient Instructions (Signed)

## 2021-02-26 NOTE — Progress Notes (Signed)
Chief Complaint  Patient presents with   Knee Pain    L/here for my last Synvisc injection.It has improved some except with all the dampness its hurting.   Encounter Diagnoses  Name Primary?   Primary osteoarthritis of left knee Yes   Chronic pain of left knee     Procedure note for injection of hyaluronic acid   Diagnosis osteoarthritis of the knee  Verbal consent was obtained to inject the knee with HYALURONIC ACID . Timeout was completed to confirm the injection site as the left   knee  Ethyl chloride spray was used for anesthesia Alcohol was used to prep the skin. The infrapatellar lateral portal was used as an injection site and 1 vial of hyaluronic acid  was injected into the knee  Specific Co. Preparation: Synvisc  No complications were noted  Return in 6 weeks

## 2021-02-28 ENCOUNTER — Other Ambulatory Visit: Payer: Self-pay | Admitting: Orthopedic Surgery

## 2021-02-28 DIAGNOSIS — M5410 Radiculopathy, site unspecified: Secondary | ICD-10-CM

## 2021-03-14 ENCOUNTER — Telehealth: Payer: Self-pay | Admitting: Orthopedic Surgery

## 2021-03-14 NOTE — Telephone Encounter (Signed)
Patient called ot relay that her left knee pain is worsening ' each week.  Said she also saw her neurosurgeon and discussed surgery, which she said he did not think she would need to have back surgery, and to go ahead with knee surgery if she wishes to.  Aware of next scheduled appointment on 04/09/21. Please advise of any recommendations or sooner appointment?

## 2021-03-14 NOTE — Telephone Encounter (Signed)
Is there anything sooner Cant work in, but if there is anything open sooner you can move her up Dr Aline Brochure has told her he can not do surgery, until after she has her back worked on.

## 2021-03-19 NOTE — Telephone Encounter (Signed)
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 Patient would like to still have the left knee looked at due to swelling. Dr Aline Brochure, please clarify if okay for patient to have you check the knee (have had cancellation slots come up)

## 2021-03-20 NOTE — Telephone Encounter (Signed)
Called patient, left message to return call.

## 2021-03-20 NOTE — Telephone Encounter (Signed)
Patient elects to just keep appointment as scheduled, due to Dr being out some days next week, and to appointment needing to be after 03/29/21.

## 2021-04-02 ENCOUNTER — Other Ambulatory Visit: Payer: Self-pay | Admitting: Orthopedic Surgery

## 2021-04-02 DIAGNOSIS — M5410 Radiculopathy, site unspecified: Secondary | ICD-10-CM

## 2021-04-09 ENCOUNTER — Other Ambulatory Visit: Payer: Self-pay

## 2021-04-09 ENCOUNTER — Ambulatory Visit (INDEPENDENT_AMBULATORY_CARE_PROVIDER_SITE_OTHER): Payer: Medicare Other | Admitting: Orthopedic Surgery

## 2021-04-09 ENCOUNTER — Encounter: Payer: Self-pay | Admitting: Orthopedic Surgery

## 2021-04-09 VITALS — BP 174/107 | HR 87 | Ht 64.0 in | Wt 248.4 lb

## 2021-04-09 DIAGNOSIS — Z6841 Body Mass Index (BMI) 40.0 and over, adult: Secondary | ICD-10-CM

## 2021-04-09 DIAGNOSIS — G8929 Other chronic pain: Secondary | ICD-10-CM | POA: Diagnosis not present

## 2021-04-09 DIAGNOSIS — M1712 Unilateral primary osteoarthritis, left knee: Secondary | ICD-10-CM

## 2021-04-09 NOTE — Patient Instructions (Addendum)
Weight loss goal 230  Smoking cessation  Pain management referral

## 2021-04-09 NOTE — Progress Notes (Signed)
Chief Complaint  Patient presents with   Knee Pain    Follow up//left knee//wants to discuss knee replacement   BP (!) 174/107   Pulse 87   Ht '5\' 4"'$  (1.626 m)   Wt 248 lb 6.4 oz (112.7 kg)   BMI 42.64 kg/m   Body mass index is 42.64 kg/m.  Goal weight 230lbs   [name] comes in for recurrent chronic left knee pain with chronic effusion has valgus osteoarthritis of the knee on the left as well as some intermittent right knee pain  She saw Dr. Arnoldo Morale he does not want to do surgery and says he cannot help her with her pain  She has several impediments to knee replacement surgery she needs to lose about 15 pounds  She needs to stop smoking smokes a pack per day failed Wellbutrin trial  There is no way I can control her pain as she has been on oxycodone 10 mg 4 times a day for several years now and I think she would be best served at a tertiary care facility or perhaps [facility] with one of the joint replacement specialist there  I think she would need preop nerve block spinal anesthetic and possibly epidural catheter for postop pain I explained this to her  We went ahead and aspirated her left knee for effusion we aspirated 20 cc of clear yellow fluid injected Celestone 6 mg with lidocaine  A steroid injection was performed at left knee after aspiration using 1% plain Lidocaine and 6 mg of Celestone. This was well tolerated.  Encounter Diagnoses  Name Primary?   Primary osteoarthritis of left knee Yes   Chronic pain of left knee    Body mass index 45.0-49.9, adult ([place])    Morbid obesity ([place])    Plan is to have her stop smoking, lose 230 pounds and then referral to joint replacement specialist and [facility] if needed  Past Medical History:  Diagnosis Date   ADHD (attention deficit hyperactivity disorder)    Anxiety    Arthritis    disk disease   Barrett esophagus    Chronic pain    COPD (chronic obstructive pulmonary disease) (HCC)    Depression     Dyspnea    Fibromyalgia    GERD (gastroesophageal reflux disease)    IBS   Headache    Hot flashes 04/11/2015   IBS (irritable bowel syndrome)    Obesity    Peripheral arterial disease (HCC)    Plantar fasciitis of left foot    PTSD (post-traumatic stress disorder)    RLQ abdominal pain 04/11/2015   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 , Disp: , Rfl:    Aspirin-Salicylamide-Caffeine (BC HEADACHE POWDER PO), Take 1 packet by mouth daily as needed (headaches)., Disp: , Rfl:    augmented betamethasone dipropionate (DIPROLENE-AF) 0.05 % ointment, Apply [id number] application topically 2 (two) times daily as needed (psoriasis). , Disp: , Rfl:    Biotin 10000 MCG TABS, Take 10,000 mcg by mouth daily. , Disp: , Rfl:    diclofenac (VOLTAREN) 75 MG EC tablet, TAKE (1) TABLET BY MOUTH TWICE DAILY WITH MEALS., Disp: 60 tablet, Rfl: 5   Diclofenac Sodium 3 % GEL, Apply 1 application topically as directed., Disp: , Rfl:    EPINEPHrine 0.3 mg/0.3 mL IJ SOAJ injection,  Inject 0.3 mg into the muscle as needed for anaphylaxis. , Disp: , Rfl:    fexofenadine (ALLEGRA) 180 MG tablet, Take 180 mg by mouth daily., Disp: , Rfl:    gabapentin (NEURONTIN) 800 MG tablet, TAKE 1 TABLET BY MOUTH THREE TIMES A DAY., Disp: 90 tablet, Rfl: 0   Hyoscyamine Sulfate SL (LEVSIN/SL) 0.125 MG SUBL, Place 0.125 mg under the tongue 2 (two) times daily as needed (abd pain)., Disp: 60 tablet, Rfl: 3   linaclotide (LINZESS) 290 MCG CAPS capsule, Take 1 capsule (290 mcg total) by mouth daily before breakfast., Disp: 90 capsule, Rfl: 3   methocarbamol (ROBAXIN) 750 MG tablet, Take 750 mg by mouth 2 (two) times daily. , Disp: , Rfl:    Oxycodone HCl 10 MG TABS, Take 10 mg by mouth 4 (four) times daily. , Disp: , Rfl:    pantoprazole (PROTONIX) 40 MG tablet, Take 1 tablet (40 mg  total) by mouth 2 (two) times daily before a meal., Disp: 180 tablet, Rfl: 3   tiZANidine (ZANAFLEX) 4 MG tablet, Take 4 mg by mouth at bedtime. , Disp: , Rfl:    topiramate (TOPAMAX) 50 MG tablet, Take 50 mg by mouth at bedtime. , Disp: , Rfl:    TRINTELLIX 10 MG TABS tablet, Take 10 mg by mouth daily., Disp: , Rfl:    vitamin B-12 (CYANOCOBALAMIN) 1000 MCG tablet, Take 1,000 mcg by mouth daily., Disp: , Rfl:    XIIDRA 5 % SOLN, Place 1 drop into both eyes in the morning and at bedtime. , Disp: , Rfl:

## 2021-04-12 ENCOUNTER — Telehealth: Payer: Self-pay

## 2021-04-12 DIAGNOSIS — M1712 Unilateral primary osteoarthritis, left knee: Secondary | ICD-10-CM

## 2021-04-12 DIAGNOSIS — G8929 Other chronic pain: Secondary | ICD-10-CM

## 2021-04-12 DIAGNOSIS — M25562 Pain in left knee: Secondary | ICD-10-CM

## 2021-04-12 NOTE — Telephone Encounter (Signed)
Patient called saying that Dr. Aline Brochure told her and her mama that he could send them both to [facility] for pain management. They both want to see Docia Barrier there. She said to be sure to put his name on their referrals. And if possible give her a call and let her know when you send the referrals, then she is going to call them and schedule appointments

## 2021-04-12 NOTE — Telephone Encounter (Signed)
I put in referral for [name] Will you send a separate message for her mother please? In her mothers chart? Thanks

## 2021-04-18 NOTE — Telephone Encounter (Signed)
Mother declines referral at this time, per [place].

## 2021-05-03 ENCOUNTER — Telehealth: Payer: Self-pay | Admitting: Orthopedic Surgery

## 2021-05-03 NOTE — Telephone Encounter (Signed)
Amy  Estill Bamberg with [facility] was returning your call.   Please call her back

## 2021-05-03 NOTE — Telephone Encounter (Signed)
She didn't leave a number to call back

## 2021-05-03 NOTE — Telephone Encounter (Signed)
What's the number? I sent her a fax x 2 because I cant get through on her extension the prompt says extension not recognized  Did she leave a good call back number?

## 2021-05-04 ENCOUNTER — Other Ambulatory Visit: Payer: Self-pay | Admitting: Orthopedic Surgery

## 2021-05-04 DIAGNOSIS — M5410 Radiculopathy, site unspecified: Secondary | ICD-10-CM

## 2021-05-09 ENCOUNTER — Ambulatory Visit (INDEPENDENT_AMBULATORY_CARE_PROVIDER_SITE_OTHER): Payer: Medicare Other | Admitting: Orthopedic Surgery

## 2021-05-09 ENCOUNTER — Encounter: Payer: Self-pay | Admitting: Orthopedic Surgery

## 2021-05-09 ENCOUNTER — Other Ambulatory Visit: Payer: Self-pay

## 2021-05-09 VITALS — BP 117/76 | HR 68 | Ht 63.5 in | Wt 243.0 lb

## 2021-05-09 DIAGNOSIS — M25462 Effusion, left knee: Secondary | ICD-10-CM | POA: Diagnosis not present

## 2021-05-09 DIAGNOSIS — Z6841 Body Mass Index (BMI) 40.0 and over, adult: Secondary | ICD-10-CM

## 2021-05-09 DIAGNOSIS — M25562 Pain in left knee: Secondary | ICD-10-CM

## 2021-05-09 DIAGNOSIS — M1712 Unilateral primary osteoarthritis, left knee: Secondary | ICD-10-CM

## 2021-05-09 DIAGNOSIS — G8929 Other chronic pain: Secondary | ICD-10-CM

## 2021-05-09 MED ORDER — PREDNISONE 10 MG PO TABS: ORAL_TABLET | ORAL | 0 refills | Status: DC

## 2021-05-09 NOTE — Progress Notes (Signed)
Chief Complaint  Patient presents with   Knee Pain    LEFT 1 mth follow up   Encounter Diagnoses  Name Primary?   Primary osteoarthritis of left knee Yes   Chronic pain of left knee    Body mass index 45.0-49.9, adult ([place])    Morbid obesity ([place])    Effusion of left knee    [name] is here today for follow-up regarding her left knee she has an effusion again.  She is trying to stop smoking lose some weight in preparation for total knee replacement which will require referral to tertiary care center to better handle her postop pain requirements  Her left knee is warm to touch there is no erythema she does have the effusion  She is consented for aspiration and injection  Timeout confirmed the left knee as the site it was placed in extension we used a suprapatellar lateral approach we used some ethyl chloride and alcohol to prep the skin we used an 18-gauge needle aspirated about 30 cc of clear yellow fluid injected 6 mg of Celestone with 4 cc of lidocaine  This was well tolerated without complication  Were going to start her on prednisone 20 mg on Monday and 20 mg on Thursday to help control the swelling of the left knee  Meds ordered this encounter  Medications   predniSONE (DELTASONE) 10 MG tablet    Sig: 20 mg on Monday 20 mg on Thursday    Dispense:  60 tablet    Refill:  0

## 2021-05-10 ENCOUNTER — Telehealth (INDEPENDENT_AMBULATORY_CARE_PROVIDER_SITE_OTHER): Payer: Self-pay | Admitting: *Deleted

## 2021-05-10 ENCOUNTER — Other Ambulatory Visit (INDEPENDENT_AMBULATORY_CARE_PROVIDER_SITE_OTHER): Payer: Self-pay | Admitting: Gastroenterology

## 2021-05-10 DIAGNOSIS — R112 Nausea with vomiting, unspecified: Secondary | ICD-10-CM

## 2021-05-10 MED ORDER — PROMETHAZINE HCL 12.5 MG PO TABS: 12.5000 mg | ORAL_TABLET | Freq: Four times a day (QID) | ORAL | 0 refills | Status: DC | PRN

## 2021-05-10 NOTE — Telephone Encounter (Signed)
Pt has upcoming appt with Dr. Jenetta Downer on 11/11. 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 Wonders if she can have some phenergan tablets called in to [facility] and then discuss h plyori on her visit on the 11th.   [phone]

## 2021-05-10 NOTE — Telephone Encounter (Signed)
Called and discussed with pt. Pt verbalized understanding.  

## 2021-05-10 NOTE — Telephone Encounter (Signed)
Pt states she has never tried zofran and she usually takes a half of the phenergan and only a whole one if she is going to lay down to go to sleep since its makes her sleepy. She states she prefers phenergan.

## 2021-05-10 NOTE — Telephone Encounter (Signed)
Has she tried Zofran instead of phenergan?

## 2021-05-10 NOTE — Telephone Encounter (Signed)
Will send a prescription for phenergan 20 pills, will need to see me in clinic for further refills

## 2021-05-11 NOTE — Telephone Encounter (Signed)
error 

## 2021-06-03 ENCOUNTER — Other Ambulatory Visit: Payer: Self-pay | Admitting: Orthopedic Surgery

## 2021-06-03 DIAGNOSIS — M5410 Radiculopathy, site unspecified: Secondary | ICD-10-CM

## 2021-06-03 DIAGNOSIS — M25462 Effusion, left knee: Secondary | ICD-10-CM

## 2021-06-06 ENCOUNTER — Ambulatory Visit (INDEPENDENT_AMBULATORY_CARE_PROVIDER_SITE_OTHER): Payer: Medicare Other | Admitting: Orthopedic Surgery

## 2021-06-06 ENCOUNTER — Encounter: Payer: Self-pay | Admitting: Orthopedic Surgery

## 2021-06-06 ENCOUNTER — Other Ambulatory Visit: Payer: Self-pay

## 2021-06-06 VITALS — BP 167/109 | HR 87 | Ht 63.5 in | Wt 248.0 lb

## 2021-06-06 DIAGNOSIS — G8929 Other chronic pain: Secondary | ICD-10-CM

## 2021-06-06 DIAGNOSIS — M25462 Effusion, left knee: Secondary | ICD-10-CM

## 2021-06-06 DIAGNOSIS — M1712 Unilateral primary osteoarthritis, left knee: Secondary | ICD-10-CM

## 2021-06-06 DIAGNOSIS — M25562 Pain in left knee: Secondary | ICD-10-CM

## 2021-06-06 DIAGNOSIS — Z6841 Body Mass Index (BMI) 40.0 and over, adult: Secondary | ICD-10-CM

## 2021-06-06 NOTE — Progress Notes (Signed)
Chief Complaint  Patient presents with   Knee Pain    Left knee/follow up    Encounter Diagnoses  Name Primary?   Primary osteoarthritis of left knee Yes   Chronic pain of left knee    Body mass index 45.0-49.9, adult (HCC)    Morbid obesity (HCC)    Effusion of left knee      [name] 55 years old she is try and lose weight and stop smoking so that she can have her left knee replaced she has a chronic back condition which neurosurgery has deemed inoperable or surgery at least not necessary  I still think a lot of her pain comes from her back and is referred to her knee  Her images do show osteoarthritis of the left knee with a valgus knee  She has a joint effusion today so we will reaspirate and inject again  Timeout was taken to confirm left knee as the site of aspiration  With the knee in extension and a towel under it to help relieve some of the pressure in the joint we used alcohol to clean the skin and then sprayed ethyl chloride and then took an 18-gauge needle and aspirated 20 cc of clear yellow fluid followed by Depo-Medrol injection of 40 mg  A second bandage was placed as the patient had some bleeding but this was stopped with a cottonball into the bandage  Patient will follow-up in a month

## 2021-06-14 ENCOUNTER — Ambulatory Visit (INDEPENDENT_AMBULATORY_CARE_PROVIDER_SITE_OTHER): Payer: Medicare Other | Admitting: Gastroenterology

## 2021-07-02 ENCOUNTER — Ambulatory Visit (INDEPENDENT_AMBULATORY_CARE_PROVIDER_SITE_OTHER): Payer: Medicare Other | Admitting: Gastroenterology

## 2021-07-02 ENCOUNTER — Other Ambulatory Visit: Payer: Self-pay

## 2021-07-02 ENCOUNTER — Encounter (INDEPENDENT_AMBULATORY_CARE_PROVIDER_SITE_OTHER): Payer: Self-pay | Admitting: Gastroenterology

## 2021-07-02 VITALS — BP 127/85 | HR 85 | Temp 99.9°F | Ht 63.5 in | Wt 245.0 lb

## 2021-07-02 DIAGNOSIS — K2270 Barrett's esophagus without dysplasia: Secondary | ICD-10-CM | POA: Diagnosis not present

## 2021-07-02 DIAGNOSIS — K219 Gastro-esophageal reflux disease without esophagitis: Secondary | ICD-10-CM

## 2021-07-02 DIAGNOSIS — K5903 Drug induced constipation: Secondary | ICD-10-CM

## 2021-07-02 DIAGNOSIS — R140 Abdominal distension (gaseous): Secondary | ICD-10-CM

## 2021-07-02 DIAGNOSIS — Z8619 Personal history of other infectious and parasitic diseases: Secondary | ICD-10-CM | POA: Diagnosis not present

## 2021-07-02 DIAGNOSIS — R110 Nausea: Secondary | ICD-10-CM

## 2021-07-02 MED ORDER — PRUCALOPRIDE SUCCINATE 2 MG PO TABS: 2.0000 mg | ORAL_TABLET | Freq: Every day | ORAL | 3 refills | Status: DC

## 2021-07-02 MED ORDER — PROMETHAZINE HCL 12.5 MG PO TABS: 12.5000 mg | ORAL_TABLET | Freq: Four times a day (QID) | ORAL | 1 refills | Status: DC | PRN

## 2021-07-02 NOTE — Progress Notes (Signed)
Referring Provider: Denyce Robert, FNP Primary Care Physician:  Denyce Robert, FNP Primary GI Physician: Jenetta Downer  Chief Complaint  Patient presents with   Nausea    Pt states she thinks she has h pylori. Having nausea and some some abdominal pain. Needs refill on linzess and phenergan. Still having some constipation while on linzess. Wants to know if she could add stool softner.    HPI:   [name] is a 55 y.o. female with past medical history of Barrett's esophagus, fibromyalgia, GERD, IBS, PAD, PTSD, vocal cord cancer.   Patient presenting today for follow up of GERD and constipation.   Patient states she thinks she may have H pylori again. Was treated for positive H pylori serology in Oct 2021 with Pylera. She is having some nausea, without vomiting and mid to upper abdominal pain. This began a few months back. She was taking phenergan for nausea which was helping, however she is now out of this. She denies fevers or chills, no recent antibiotics. No blood in stools or black stools. No changes in appetite or weight loss. She also has bloating and gas. States symptoms feel similar to when she had H pylori before.    Constipation: she is currently on Linzess 264mcg daily, she also takes opiate pain medication chronically. She is having a BM about every 3 days. She will initially have to strain but then once stools are beginning to pass, she no longer has to strain. Her diet is not great, she eats mostly whatever she wants, but does try to get in at least one fruit per day. She is drinking a few large glasses of water per day.   GERD/Barrett's: maintained on Protonix 40mg  BID. She feels that reflux is well controlled on this, though she is still belching and having a lot of gas. She denies dysphagia. She has had no weight loss or changes in her appetite.   Last Colonoscopy:05/31/20- The entire examined colon is normal. - External hemorrhoids. - No specimens collected.  Last  Endoscopy:05/31/20- Normal hypopharynx. - Normal proximal esophagus and mid esophagus. - Esophageal mucosal changes consistent with short-segment Barrett's esophagus, secondary to established short-segment Barrett's disease. Single small patch. Biopsied. (Barretts without dysplasia) - Z-line irregular, 33 cm from the incisors. - 2 cm hiatal hernia. - Gastritis. - Normal duodenal bulb and second portion of the duodenum. H pylori positive, she was treated with Pylera  Past Medical History:  Diagnosis Date   ADHD (attention deficit hyperactivity disorder)    Anxiety    Arthritis    disk disease   Barrett esophagus    Chronic pain    COPD (chronic obstructive pulmonary disease) (HCC)    Depression    Dyspnea    Fibromyalgia    GERD (gastroesophageal reflux disease)    IBS   Headache    Hot flashes 04/11/2015   IBS (irritable bowel syndrome)    Obesity    Peripheral arterial disease (HCC)    Plantar fasciitis of left foot    PTSD (post-traumatic stress disorder)    RLQ abdominal pain 04/11/2015   Vocal cord cancer ([place])    surgery corrected the problem     Past Surgical History:  Procedure Laterality Date   ANTERIOR CERVICAL DECOMP/DISCECTOMY FUSION  08/18/2012   Procedure: ANTERIOR CERVICAL DECOMPRESSION/DISCECTOMY FUSION 2 LEVELS;  Surgeon: Faythe Ghee, MD;  Location: [facility];  Service: Neurosurgery;  Laterality: N/A;  anterior cervical five-six,six-seven decompression fusion plating   ANTERIOR LAT LUMBAR  FUSION  03/19/2012   Procedure: ANTERIOR LATERAL LUMBAR FUSION 1 LEVEL;  Surgeon: Faythe Ghee, MD;  Location: [place];  Service: Neurosurgery;  Laterality: Right;  Anterolateral Lumbar Interbody Fusion, Lumbar Three-Four (Hand broke through glove and nat in the room)   BACK SURGERY     BUNIONECTOMY Left 08/04/2014   Procedure: LEFT FOOT LAPIDUS BUNION CORRECTION, LEFT SECOND TARSALMETATARSAL JOINT DEBRIDEMENT AND LEFT MODIFIED MCBRIDE BUNIONECTOMY;  Surgeon: [name], MD;  Location: [place];  Service: Orthopedics;  Laterality: Left;   CARPAL TUNNEL RELEASE Bilateral    CHONDROPLASTY Left 12/25/2016   Procedure: CHONDROPLASTY LEFT PATELLA;  Surgeon: Carole Civil, MD;  Location: [facility];  Service: Orthopedics;  Laterality: Left;   CLEFT PALATE REPAIR     COLONOSCOPY N/A 02/16/2015   Procedure: COLONOSCOPY;  Surgeon: Rogene Houston, MD;  Location: [facility];  Service: Endoscopy;  Laterality: N/A;  155   COLONOSCOPY WITH PROPOFOL N/A 05/31/2020   Procedure: COLONOSCOPY WITH PROPOFOL;  Surgeon: Rogene Houston, MD;  Location: [facility];  Service: Endoscopy;  Laterality: N/A;   ENDOMETRIAL ABLATION     ESOPHAGOGASTRODUODENOSCOPY N/A 02/16/2015   Procedure: ESOPHAGOGASTRODUODENOSCOPY (EGD);  Surgeon: Rogene Houston, MD;  [facility]: [facility];  Service: Endoscopy;  Laterality: N/A;   ESOPHAGOGASTRODUODENOSCOPY (EGD) WITH PROPOFOL N/A 05/31/2020   Procedure: ESOPHAGOGASTRODUODENOSCOPY (EGD) WITH PROPOFOL;  Surgeon: Rogene Houston, MD;  [facility]: [facility];  Service: Endoscopy;  Laterality: N/A;  1230   KNEE ARTHROSCOPY WITH MEDIAL MENISECTOMY Left 12/25/2016   Procedure: KNEE ARTHROSCOPY WITH MEDIAL AND LATERAL MENISECTOMY;  Surgeon: Carole Civil, MD;  Location: [facility];  Service: Orthopedics;  Laterality: Left;   LAPAROSCOPIC TUBAL LIGATION     [facility]   MAXIMUM ACCESS (MAS)POSTERIOR LUMBAR INTERBODY FUSION (PLIF) 2 LEVEL  08/08/2016   NOSE SURGERY     SHOULDER OPEN ROTATOR CUFF REPAIR Right 04/02/2013   Procedure: ROTATOR CUFF REPAIR SHOULDER OPEN;  Surgeon: Carole Civil, MD;  Location: [facility];  Service: Orthopedics;  Laterality: Right;   THROAT SURGERY  2000   for throat cancer/no problems intubation...[facility]   TONSILLECTOMY     TUBAL LIGATION      Current Outpatient Medications  Medication Sig Dispense Refill   amphetamine-dextroamphetamine (ADDERALL) 30 MG tablet Take 15 mg by mouth  3 (three) times daily.      Aspirin-Salicylamide-Caffeine (BC HEADACHE POWDER PO) Take 1 packet by mouth daily as needed (headaches).     augmented betamethasone dipropionate (DIPROLENE-AF) 0.05 % ointment Apply 4.13 application topically 2 (two) times daily as needed (psoriasis).      Cholecalciferol (VITAMIN D3 PO) Take by mouth. 5,000 daily     diclofenac (VOLTAREN) 75 MG EC tablet TAKE (1) TABLET BY MOUTH TWICE DAILY WITH MEALS. 60 tablet 5   Diclofenac Sodium 3 % GEL Apply 1 application topically as directed.     EPINEPHrine 0.3 mg/0.3 mL IJ SOAJ injection Inject 0.3 mg into the muscle as needed for anaphylaxis.      fexofenadine (ALLEGRA) 180 MG tablet Take 180 mg by mouth. As needed     gabapentin (NEURONTIN) 800 MG tablet TAKE 1 TABLET BY MOUTH THREE TIMES A DAY. 90 tablet 5   Hyoscyamine Sulfate SL (LEVSIN/SL) 0.125 MG SUBL Place 0.125 mg under the tongue 2 (two) times daily as needed (abd pain). 60 tablet 3   linaclotide (LINZESS) 290 MCG CAPS capsule Take 1 capsule (290 mcg total) by mouth daily  before breakfast. 90 capsule 3   methocarbamol (ROBAXIN) 750 MG tablet Take 750 mg by mouth 2 (two) times daily.      oxyCODONE HCl 15 MG TABA Take 15 mg by mouth. Takes 5 per day     pantoprazole (PROTONIX) 40 MG tablet Take 1 tablet (40 mg total) by mouth 2 (two) times daily before a meal. 180 tablet 3   predniSONE (DELTASONE) 10 MG tablet 20 mg on Monday 20 mg on Thursday 60 tablet 0   promethazine (PHENERGAN) 12.5 MG tablet Take 1 tablet (12.5 mg total) by mouth every 6 (six) hours as needed for nausea or vomiting. 20 tablet 0   tiZANidine (ZANAFLEX) 4 MG tablet Take 4 mg by mouth at bedtime.      topiramate (TOPAMAX) 50 MG tablet Take 50 mg by mouth at bedtime.      TRINTELLIX 10 MG TABS tablet Take 10 mg by mouth daily.     XIIDRA 5 % SOLN Place 1 drop into both eyes in the morning and at bedtime.      No current facility-administered medications for this visit.    Allergies as of  07/02/2021 - Review Complete 07/02/2021  Allergen Reaction Noted   Bee venom Anaphylaxis 06/11/2011   Morphine and related Other (See Comments) 08/09/2016   Adhesive [tape] Itching 07/19/2016    Family History  Problem Relation Age of Onset   Diabetes Mother    Hypertension Mother    Heart disease Maternal Grandfather        sudden death   Cancer Paternal Grandmother    Cancer Paternal Grandfather    Other Daughter        allergies; scolosis   Cancer Paternal Uncle    Cancer Paternal Uncle    Cancer Paternal Uncle    Cancer Paternal Uncle     Social History   Socioeconomic History   Marital status: Married    Spouse name: Not on file   Number of children: Not on file   Years of education: Not on file   Highest education level: Not on file  Occupational History   Not on file  Tobacco Use   Smoking status: Some Days    Packs/day: 0.50    Years: 30.00    Pack years: 15.00    Types: Cigarettes   Smokeless tobacco: Never  Vaping Use   Vaping Use: Never used  Substance and Sexual Activity   Alcohol use: Yes    Comment: 08/08/2016 "few times/year"   Drug use: Not Currently    Types: Marijuana    Comment: 08/08/2016 "maybe once/week; nothing consistent"   Sexual activity: Yes    Birth control/protection: Surgical    Comment: tubal  Other Topics Concern   Not on file  Social History Narrative   Not on file   Social Determinants of Health   Financial Resource Strain: Not on file  Food Insecurity: Not on file  Transportation Needs: Not on file  Physical Activity: Not on file  Stress: Not on file  Social Connections: Not on file   Review of systems General: negative for malaise, night sweats, fever, chills, weight loss Neck: Negative for lumps, goiter, pain and significant neck swelling Resp: Negative for cough, wheezing, dyspnea at rest CV: Negative for chest pain, leg swelling, palpitations, orthopnea GI: denies melena, hematochezia, vomiting,  diarrhea,dysphagia, odyonophagia, early satiety or unintentional weight loss. +constipation, +nausea +bloating +gas MSK: Negative for joint pain or swelling, back pain, and muscle pain. Derm: Negative  for itching or rash Psych: Denies depression, anxiety, memory loss, confusion. No homicidal or suicidal ideation.  Heme: Negative for prolonged bleeding, bruising easily, and swollen nodes. Endocrine: Negative for cold or heat intolerance, polyuria, polydipsia and goiter. Neuro: negative for tremor, gait imbalance, syncope and seizures. The remainder of the review of systems is noncontributory.  Physical Exam: BP 127/85 (BP Location: Right Arm, Patient Position: Sitting, Cuff Size: Large)   Pulse 85   Temp 99.9 F (37.7 C) (Oral)   Ht 5' 3.5" (1.613 m)   Wt 245 lb (111.1 kg)   BMI 42.72 kg/m  General:   Alert and oriented. No distress noted. Pleasant and cooperative.  Head:  Normocephalic and atraumatic. Eyes:  Conjuctiva clear without scleral icterus. Mouth:  Oral mucosa pink and moist. Good dentition. No lesions. Heart: Normal rate and rhythm, s1 and s2 heart sounds present.  Lungs: Clear lung sounds in all lobes. Respirations equal and unlabored. Abdomen:  +BS, soft, non-tender and non-distended. No rebound or guarding. No HSM or masses noted. Derm: No palmar erythema or jaundice Msk:  Symmetrical without gross deformities. Normal posture. Extremities:  Without edema. Neurologic:  Alert and  oriented x4 Psych:  Alert and cooperative. Normal mood and affect.  Invalid input(s): 6 MONTHS   ASSESSMENT: [name] is a 55 y.o. female presenting today for follow up of GERD and constipation with new symptoms of nausea and abdominal pain for the past few months.  Constipation still seems poorly managed on Linzess 26mcg, with a BM only every 2-3 days, likley heavily influenced by chronic opiate pain medications she is taking. I discussed that she needs to make sure she has a diet  high in fruits and veggies, especially kiwi or prune a day, and needs to be drinking plenty of water. We will trial motegrity 2mg  once daily in place of linzess, as motegrity has been found to be more beneficial in patients with opioid induced constipation. She cannot take miralax as it induces vomiting.   GERD is well controlled on pantoprazole 40mg  BID, however, she is having abdominal bloating, upper abdominal discomfort, nausea and a lot of gas/belching. She reports symptoms feels similar to when she was treated for H Pylori last year. Will do urea breath test to evaluate for recurrent H Pylori, she is aware she needs to be off of her PPI as well as avoid bismuth and antibiotics x2 weeks prior to completing this test. She reports that treatment with pylera last year gave her not only diarrhea, but also nausea and vomiting. We will consider different treatment if urea breath test is positive. I am also sending Rx for phenergan for her nausea, she should take this sparingly as it can worsen her constipation. Reassuringly, she has had no weight loss, changes in appetite, post prandial abdominal pain, rectal bleeding or melena and last EGD was 1 year ago.    PLAN:  Stop linzess, start motegrity 2. Urea breath test, hold PPI, avoid bismuth and abx x2 weeks prior to 3. Resume PPI BID after breath test completed 4. Kiwi or prune daily to help with constipation 5. Drink plenty of water   Follow Up: 3 months  Cabell Lazenby L. Alver Sorrow, MSN, APRN, AGNP-C Adult-Gerontology Nurse Practitioner [facility]

## 2021-07-02 NOTE — Patient Instructions (Addendum)
I am ordering a urea breath test to check for active H pylori infection, you need to make sure you are off of your protonix x2 weeks prior to having this test done. You also need to not be on any bismuth or antibiotics x2 weeks prior to testing. You can resume protonix twice a day after test is complete. I will be in touch once this is done and results are back. We will consider different treatment since you had side effects last time with Pylera.  I am sending motegrity (prucalopride) 2mg  for you to take for constipation, take this once daily in the mornings. Stop linzess. If this does not provide good results after a few weeks, please let me know and we will discuss other options. Try to drink plenty of water, kiwi and prunes are also very good for constipation.   I have sent refill of phenergan 12.5mg  to be taken up to every 6 hours as needed, please be mindful this can worsen constipation so take it only as needed.   Follow up 3 months

## 2021-07-05 ENCOUNTER — Encounter: Payer: Self-pay | Admitting: Orthopedic Surgery

## 2021-07-05 ENCOUNTER — Other Ambulatory Visit: Payer: Self-pay

## 2021-07-05 ENCOUNTER — Ambulatory Visit (INDEPENDENT_AMBULATORY_CARE_PROVIDER_SITE_OTHER): Payer: Medicare Other | Admitting: Orthopedic Surgery

## 2021-07-05 DIAGNOSIS — M1712 Unilateral primary osteoarthritis, left knee: Secondary | ICD-10-CM | POA: Diagnosis not present

## 2021-07-05 DIAGNOSIS — M25562 Pain in left knee: Secondary | ICD-10-CM

## 2021-07-05 DIAGNOSIS — Z6841 Body Mass Index (BMI) 40.0 and over, adult: Secondary | ICD-10-CM

## 2021-07-05 DIAGNOSIS — M25462 Effusion, left knee: Secondary | ICD-10-CM

## 2021-07-05 DIAGNOSIS — G8929 Other chronic pain: Secondary | ICD-10-CM

## 2021-07-05 NOTE — Patient Instructions (Signed)

## 2021-07-05 NOTE — Progress Notes (Signed)
Chief Complaint  Patient presents with   Knee Pain    Left / has fluid again    Encounter Diagnoses  Name Primary?   Primary osteoarthritis of left knee Yes   Chronic pain of left knee    Body mass index 45.0-49.9, adult (HCC)    Morbid obesity (HCC)    Effusion of left knee    Request aspiration injection left knee  Review of systems increased back pain  Medication pain management has increased oxycodone to 15 mg 5 times a day  Patient has decreased to 5 to 6 cigarettes/day  Procedure note injection and aspiration left knee joint  Verbal consent was obtained to aspirate and inject the left knee joint   Timeout was completed to confirm the site of aspiration and injection  An 18-gauge needle was used to aspirate the left knee joint from a suprapatellar lateral approach.  The medications used were 40 mg of Depo-Medrol and 1% lidocaine 3 cc  Anesthesia was provided by ethyl chloride and the skin was prepped with alcohol.  After cleaning the skin with alcohol an 18-gauge needle was used to aspirate the right knee joint.  We obtained 25 cc of fluid clear yellow  We followed this by injection of 40 mg of Depo-Medrol and 3 cc 1% lidocaine.  There were no complications. A sterile bandage was applied.   Check in a month

## 2021-07-16 ENCOUNTER — Telehealth (INDEPENDENT_AMBULATORY_CARE_PROVIDER_SITE_OTHER): Payer: Self-pay | Admitting: Gastroenterology

## 2021-07-16 ENCOUNTER — Other Ambulatory Visit (INDEPENDENT_AMBULATORY_CARE_PROVIDER_SITE_OTHER): Payer: Self-pay | Admitting: *Deleted

## 2021-07-16 MED ORDER — LINACLOTIDE 290 MCG PO CAPS: 290.0000 ug | ORAL_CAPSULE | Freq: Every day | ORAL | 0 refills | Status: DC

## 2021-07-16 NOTE — Telephone Encounter (Signed)
Called [facility] and they still have refills on file for linzess and they cancel motegrity. Pt called and notify she can switch back to linzess per chelsea.

## 2021-07-16 NOTE — Telephone Encounter (Signed)
Patient left voice mail message stating she saw [place] on 12/5 - stated her medication was changed - patient needs someone to call her regarding this - ph# [phone]

## 2021-07-16 NOTE — Telephone Encounter (Signed)
Pt states she linzess 290 was changed to motegrity. 2mg . She states she went a whole week with no BM and had to use an enema and she went back to the linzess she had at home and doing better with that. States she would like to go back on linzess 290 and will just use a stool softner as needed with it. Also wants to let chelsea know she has been unable to stop PPI for 2 weeks to do breath test for h pylori. States she went about 2 days without it and had to go back on it. She wants to wait til after the holidays and try again to do test when she can eat a more bland diet.   [facility].

## 2021-08-02 ENCOUNTER — Encounter: Payer: Self-pay | Admitting: Orthopedic Surgery

## 2021-08-02 ENCOUNTER — Other Ambulatory Visit: Payer: Self-pay

## 2021-08-02 ENCOUNTER — Ambulatory Visit (INDEPENDENT_AMBULATORY_CARE_PROVIDER_SITE_OTHER): Payer: Medicare Other | Admitting: Orthopedic Surgery

## 2021-08-02 VITALS — Ht 63.5 in | Wt 242.0 lb

## 2021-08-02 DIAGNOSIS — M1712 Unilateral primary osteoarthritis, left knee: Secondary | ICD-10-CM

## 2021-08-02 NOTE — Progress Notes (Signed)
Body mass index is 42.2 kg/m.  Chief Complaint  Patient presents with   Knee Pain    Left    Encounter Diagnosis  Name Primary?   Primary osteoarthritis of left knee Yes    [name] is here to get her knee drained.  She has chronic osteoarthritis  She has a BMI of 42 she still smokes.  She has chronic pain.  She is a poor candidate for total knee replacement although she wishes to proceed  I think she would best be served at a tertiary care facility where advanced pain management techniques could be used  In any event her left knee shows no signs of infection she does have a moderately sized joint effusion and we aspirated her left knee from a lateral approach and got back 20 cc of clear fluid and injected Depo-Medrol 40 mg 1% lidocaine 3 cc  Procedure note injection and aspiration left knee joint  Verbal consent was obtained to aspirate and inject the left knee joint   Timeout was completed to confirm the site of aspiration and injection  An 18-gauge needle was used to aspirate the left knee joint from a suprapatellar lateral approach.  The medications used were 40 mg of Depo-Medrol and 1% lidocaine 3 cc  Anesthesia was provided by ethyl chloride and the skin was prepped with alcohol.  After cleaning the skin with alcohol an 18-gauge needle was used to aspirate the right knee joint.  We obtained 25 cc of fluid clear  We followed this by injection of 40 mg of Depo-Medrol and 3 cc 1% lidocaine.  There were no complications. A sterile bandage was applied.

## 2021-08-13 ENCOUNTER — Telehealth (INDEPENDENT_AMBULATORY_CARE_PROVIDER_SITE_OTHER): Payer: Self-pay | Admitting: *Deleted

## 2021-08-13 ENCOUNTER — Other Ambulatory Visit (INDEPENDENT_AMBULATORY_CARE_PROVIDER_SITE_OTHER): Payer: Self-pay

## 2021-08-13 ENCOUNTER — Other Ambulatory Visit (INDEPENDENT_AMBULATORY_CARE_PROVIDER_SITE_OTHER): Payer: Self-pay | Admitting: Gastroenterology

## 2021-08-13 DIAGNOSIS — K219 Gastro-esophageal reflux disease without esophagitis: Secondary | ICD-10-CM

## 2021-08-13 MED ORDER — PANTOPRAZOLE SODIUM 40 MG PO TBEC: 40.0000 mg | DELAYED_RELEASE_TABLET | Freq: Two times a day (BID) | ORAL | 3 refills | Status: DC

## 2021-08-13 MED ORDER — PROMETHAZINE HCL 25 MG PO TABS
25.0000 mg | ORAL_TABLET | Freq: Three times a day (TID) | ORAL | 0 refills | Status: DC | PRN
Start: 2021-08-13 — End: 2022-04-15

## 2021-08-13 NOTE — Telephone Encounter (Signed)
2 parts    Vikki Ports was giving her phenergan for possible h-pylori --she has to take 2 12.5 mg to work.  Can a stronger one be called in/written instead of the lower dose.  2.  She sent in for her refill on protonix and now she is completely out--It had [name] name on it and she needs these please.  She get 3 mos at a time.  Uses [facility]  Please call her when all of this is completed.  Next visit 09/2021 with Dr.

## 2021-08-13 NOTE — Telephone Encounter (Incomplete Revision)
2 parts    [name] was giving her phenergan for possible h-pylori --she has to take 2 12.5 mg to work.  Can a stronger one be called in/written instead of the lower dose.  2.  She sent in for her refill on protonix and now she is completely out--It had [name] name on it and she needs these please.  She get 3 mos at a time.  Uses [facility]  Please call her when all of this is completed.  Next visit 09/2021 with Dr. Loletha Grayer

## 2021-08-13 NOTE — Telephone Encounter (Signed)
I have sent in the patient's ppi to [facility]. She would like to know if you can increase the phenergan mg to maybe 25 mg, as she is having to take two at a time now of the 12.5 mg. She states she would rather have the higher dose and cut in half if needed. Would like sent to [facility]. Please advise.

## 2021-08-14 NOTE — Telephone Encounter (Signed)
Patient aware of all.

## 2021-08-30 ENCOUNTER — Ambulatory Visit: Payer: Medicare Other | Admitting: Orthopedic Surgery

## 2021-09-11 LAB — H. PYLORI BREATH TEST: H. pylori Breath Test: NOT DETECTED

## 2021-09-12 ENCOUNTER — Other Ambulatory Visit (INDEPENDENT_AMBULATORY_CARE_PROVIDER_SITE_OTHER): Payer: Self-pay | Admitting: Gastroenterology

## 2021-09-12 MED ORDER — HYDROCORTISONE (PERIANAL) 2.5 % EX CREA: 1.0000 "application " | TOPICAL_CREAM | Freq: Two times a day (BID) | CUTANEOUS | 2 refills | Status: AC

## 2021-09-13 ENCOUNTER — Ambulatory Visit (INDEPENDENT_AMBULATORY_CARE_PROVIDER_SITE_OTHER): Payer: Medicare Other | Admitting: Orthopedic Surgery

## 2021-09-13 ENCOUNTER — Encounter: Payer: Self-pay | Admitting: Orthopedic Surgery

## 2021-09-13 ENCOUNTER — Other Ambulatory Visit: Payer: Self-pay

## 2021-09-13 VITALS — Ht 63.5 in | Wt 241.0 lb

## 2021-09-13 DIAGNOSIS — M25462 Effusion, left knee: Secondary | ICD-10-CM

## 2021-09-13 DIAGNOSIS — M1712 Unilateral primary osteoarthritis, left knee: Secondary | ICD-10-CM | POA: Diagnosis not present

## 2021-09-13 NOTE — Patient Instructions (Addendum)
You have received an injection of steroids into the joint. 15% of patients will have increased pain within the 24 hours postinjection.   This is transient and will go away.   We recommend that you use ice packs on the injection site for 20 minutes every 2 hours and extra strength Tylenol 2 tablets every 8 as needed until the pain resolves.  If you continue to have pain after taking the Tylenol and using the ice please call the office for further instructions.  Ins pays for Synvisc at 80%, remaining 20% will be billed to [facility] and they could or could not pay.  Patient should not have any OOP either way.    Synvisc / can repeat in March.

## 2021-09-13 NOTE — Progress Notes (Signed)
Chief Complaint  Patient presents with   Injections    Lt knee    Procedure note injection and aspiration left knee joint  Verbal consent was obtained to aspirate and inject the left knee joint   Timeout was completed to confirm the site of aspiration and injection  An 18-gauge needle was used to aspirate the left knee joint from a suprapatellar lateral approach.  The medications used were 40 mg of Depo-Medrol and 1% lidocaine 3 cc  Anesthesia was provided by ethyl chloride and the skin was prepped with alcohol.  After cleaning the skin with alcohol an 18-gauge needle was used to aspirate the right knee joint.  We obtained 0 cc of fluid   We followed this by injection of 40 mg of Depo-Medrol and 3 cc 1% lidocaine.  There were no complications. A sterile bandage was applied.

## 2021-10-11 ENCOUNTER — Ambulatory Visit (INDEPENDENT_AMBULATORY_CARE_PROVIDER_SITE_OTHER): Payer: Medicare Other | Admitting: Orthopedic Surgery

## 2021-10-11 ENCOUNTER — Other Ambulatory Visit: Payer: Self-pay

## 2021-10-11 ENCOUNTER — Encounter: Payer: Self-pay | Admitting: Orthopedic Surgery

## 2021-10-11 VITALS — Wt 244.1 lb

## 2021-10-11 DIAGNOSIS — M1712 Unilateral primary osteoarthritis, left knee: Secondary | ICD-10-CM | POA: Diagnosis not present

## 2021-10-11 NOTE — Progress Notes (Signed)
Chief Complaint  ?Patient presents with  ? Knee Pain  ?  L/ here to get my first injection  ? ?1/3 inj left knee  ? ?Procedure note for injection of hyaluronic acid  ? ?Diagnosis osteoarthritis of the knee ? ?Verbal consent was obtained to inject the knee with HYALURONIC ACID . Timeout was completed to confirm the injection site as the left knee    Knee ? ?Ethyl chloride spray was used for anesthesia ?Alcohol was used to prep the skin. The infrapatellar lateral portal was used as an injection site and 1 vial of hyaluronic acid  was injected into the knee ? ?Specific Co. Preparation: synvisc ? ?No complications were noted ? ?Ret 1 week s ? ?

## 2021-10-15 ENCOUNTER — Ambulatory Visit (INDEPENDENT_AMBULATORY_CARE_PROVIDER_SITE_OTHER): Payer: Medicare Other | Admitting: Gastroenterology

## 2021-10-18 ENCOUNTER — Ambulatory Visit (INDEPENDENT_AMBULATORY_CARE_PROVIDER_SITE_OTHER): Payer: Medicare Other | Admitting: Orthopedic Surgery

## 2021-10-18 ENCOUNTER — Encounter: Payer: Self-pay | Admitting: Orthopedic Surgery

## 2021-10-18 ENCOUNTER — Other Ambulatory Visit: Payer: Self-pay

## 2021-10-18 VITALS — Ht 63.5 in | Wt 244.0 lb

## 2021-10-18 DIAGNOSIS — M1712 Unilateral primary osteoarthritis, left knee: Secondary | ICD-10-CM | POA: Diagnosis not present

## 2021-10-18 DIAGNOSIS — M25462 Effusion, left knee: Secondary | ICD-10-CM

## 2021-10-18 DIAGNOSIS — M25562 Pain in left knee: Secondary | ICD-10-CM

## 2021-10-18 DIAGNOSIS — G8929 Other chronic pain: Secondary | ICD-10-CM

## 2021-10-18 NOTE — Progress Notes (Signed)
FOLLOW UP  ? ?Encounter Diagnoses  ?Name Primary?  ? Primary osteoarthritis of left knee Yes  ? Chronic pain of left knee   ? Effusion of left knee   ? ? ? ?Chief Complaint  ?Patient presents with  ? Injections  ?  Lt knee Synvisc #2  ? ? ? ?The patient is here for the second of 3 in injections of Synvisc into the left knee ? ?She had a small effusion ? ?Procedure ? ?Injection left knee with aspiration ? ?Patient gave permission verbally ? ?Site confirmed verbally ? ? ?We aspirated the left knee and obtained about 8 cc of clear fluid and injected hyaluronic acid in the form of Synvisc ? ?There were no complications ?

## 2021-10-25 ENCOUNTER — Ambulatory Visit: Payer: Medicare Other | Admitting: Orthopedic Surgery

## 2021-10-29 ENCOUNTER — Encounter: Payer: Self-pay | Admitting: Orthopedic Surgery

## 2021-10-29 ENCOUNTER — Ambulatory Visit (INDEPENDENT_AMBULATORY_CARE_PROVIDER_SITE_OTHER): Payer: Medicare Other | Admitting: Orthopedic Surgery

## 2021-10-29 DIAGNOSIS — G8929 Other chronic pain: Secondary | ICD-10-CM

## 2021-10-29 DIAGNOSIS — M1712 Unilateral primary osteoarthritis, left knee: Secondary | ICD-10-CM

## 2021-10-29 DIAGNOSIS — M5410 Radiculopathy, site unspecified: Secondary | ICD-10-CM

## 2021-10-29 NOTE — Progress Notes (Signed)
Chief Complaint  ?Patient presents with  ? Follow-up  ?  Recheck on left knee, 3rd Synvisc injection.  ? ? ?Encounter Diagnoses  ?Name Primary?  ? Primary osteoarthritis of left knee Yes  ? Chronic pain of left knee   ? Radicular leg pain   ? Chronic bilateral low back pain, unspecified whether sciatica present   ? ? ?Third Synvisc injection left knee ? ?The patient consented for injections into this left knee ? ?Procedure note for injection of hyaluronic acid  ? ?Diagnosis osteoarthritis of the knee ? ?Verbal consent was obtained to inject the knee with HYALURONIC ACID . Timeout was completed to confirm the injection site as the left   knee ? ?Ethyl chloride spray was used for anesthesia ?Alcohol was used to prep the skin. The infrapatellar lateral portal was used as an injection site and 1 vial of hyaluronic acid  was injected into the knee ? ?Specific Co. Preparation: Synvisc ? ?No complications were noted ? ?Return 4 weeks see if pain increased or decreased ?

## 2021-10-29 NOTE — Patient Instructions (Signed)
Please excuse [name] from jury duty ? ?She cannot sit for long periods of time due to medical conditions which include but are not limited to ? ?Encounter Diagnoses  ?Name Primary?  ? Primary osteoarthritis of left knee Yes  ? Chronic pain of left knee   ? Radicular leg pain   ? Chronic bilateral low back pain, unspecified whether sciatica present   ? ? ?

## 2021-11-01 ENCOUNTER — Other Ambulatory Visit: Payer: Self-pay | Admitting: Orthopedic Surgery

## 2021-11-01 DIAGNOSIS — M25462 Effusion, left knee: Secondary | ICD-10-CM

## 2021-11-01 DIAGNOSIS — M5410 Radiculopathy, site unspecified: Secondary | ICD-10-CM

## 2021-11-26 ENCOUNTER — Ambulatory Visit (INDEPENDENT_AMBULATORY_CARE_PROVIDER_SITE_OTHER): Payer: Medicare Other | Admitting: Orthopedic Surgery

## 2021-11-26 DIAGNOSIS — G8929 Other chronic pain: Secondary | ICD-10-CM | POA: Diagnosis not present

## 2021-11-26 DIAGNOSIS — M1712 Unilateral primary osteoarthritis, left knee: Secondary | ICD-10-CM

## 2021-11-26 DIAGNOSIS — M25562 Pain in left knee: Secondary | ICD-10-CM

## 2021-11-26 NOTE — Progress Notes (Signed)
Chief Complaint  ?Patient presents with  ? Knee Pain  ?  Bilateral follow up s/p synvisc  ? ?56 year old female had a series of Synvisc injections which did not work ? ?She would like a cortisone injection left knee I think that is appropriate ? ?Procedure note left knee injection  ? ?verbal consent was obtained to inject left knee joint ? ?Timeout was completed to confirm the site of injection ? ?The medications used were depomedrol 40 mg and 1% lidocaine 3 cc ?Anesthesia was provided by ethyl chloride and the skin was prepped with alcohol. ? ?After cleaning the skin with alcohol a 20-gauge needle was used to inject the left knee joint. There were no complications. A sterile bandage was applied. ?  ?

## 2021-11-29 ENCOUNTER — Other Ambulatory Visit: Payer: Self-pay | Admitting: Student

## 2021-11-29 ENCOUNTER — Other Ambulatory Visit (HOSPITAL_COMMUNITY): Payer: Self-pay | Admitting: Student

## 2021-11-29 DIAGNOSIS — M546 Pain in thoracic spine: Secondary | ICD-10-CM

## 2021-12-04 ENCOUNTER — Ambulatory Visit (INDEPENDENT_AMBULATORY_CARE_PROVIDER_SITE_OTHER): Payer: Medicare Other | Admitting: Dermatology

## 2021-12-04 ENCOUNTER — Encounter: Payer: Self-pay | Admitting: Dermatology

## 2021-12-04 DIAGNOSIS — L821 Other seborrheic keratosis: Secondary | ICD-10-CM | POA: Diagnosis not present

## 2021-12-04 DIAGNOSIS — L409 Psoriasis, unspecified: Secondary | ICD-10-CM | POA: Diagnosis not present

## 2021-12-04 DIAGNOSIS — Z1283 Encounter for screening for malignant neoplasm of skin: Secondary | ICD-10-CM

## 2021-12-04 DIAGNOSIS — L309 Dermatitis, unspecified: Secondary | ICD-10-CM | POA: Diagnosis not present

## 2021-12-04 MED ORDER — BETAMETHASONE DIPROPIONATE 0.05 % EX CREA: TOPICAL_CREAM | Freq: Two times a day (BID) | CUTANEOUS | 3 refills | Status: AC | PRN

## 2021-12-04 NOTE — Patient Instructions (Signed)
If hands flare back up 10-12 min soak in water with one drop of dawn dish detergent. Then apply Vaseline with a glove.  ?

## 2021-12-12 ENCOUNTER — Ambulatory Visit: Payer: Medicare Other | Admitting: Dermatology

## 2021-12-12 ENCOUNTER — Ambulatory Visit (HOSPITAL_COMMUNITY)
Admission: RE | Admit: 2021-12-12 | Discharge: 2021-12-12 | Disposition: A | Discharge status: Home / Self Care | Payer: Medicare Other | Source: Ambulatory Visit | Attending: Student | Admitting: Student

## 2021-12-12 DIAGNOSIS — M546 Pain in thoracic spine: Secondary | ICD-10-CM | POA: Diagnosis not present

## 2021-12-20 ENCOUNTER — Ambulatory Visit (INDEPENDENT_AMBULATORY_CARE_PROVIDER_SITE_OTHER): Payer: Medicare Other | Admitting: Gastroenterology

## 2021-12-22 NOTE — Progress Notes (Signed)
   Follow-Up Visit   Subjective  [name] is a 56 y.o. female who presents for the following: Dermatitis (Hands have improved they get raw like and thick scale, also she needs refill on the betamethasone she puts on her elbows ).  Normal skin examination, chronic rashes hands plus psoriasis Location:  Duration:  Quality:  Associated Signs/Symptoms: Modifying Factors:  Severity:  Timing: Context:   Objective  Well appearing patient in no apparent distress; mood and affect are within normal limits. Generalized, Mid Back General examination: No atypical pigmented lesions (all checked with dermoscopy), no nonmelanoma skin cancer  Left Hand - Anterior, Right Hand - Anterior Hands are better.  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  The goal will certainly be maintain functionality strive for comfort is much as possible.  Discussed other options including new JAK epidurals.  Left Elbow - Posterior, Left Hand - Anterior, Right Elbow - Posterior Okay refills on her betamethasone; this would not be used on the face or body folds.  Ideally using it intermittently rather than every day.  Right Lower Back Flattopped brown keratotic 6 mm papule, typical dermoscopy    A full examination was performed including scalp, head, eyes, ears, nose, lips, neck, chest, axillae, abdomen, back, buttocks, bilateral upper extremities, bilateral lower extremities, hands, feet, fingers, toes, fingernails, and toenails. All findings within normal limits unless otherwise noted below.  Areas beneath undergarments not fully examined.   Assessment & Plan    Screening for malignant neoplasm of skin (2) Mid Back; Generalized  Annual skin examination  Dermatitis Left Hand - Anterior; Right Hand - Anterior  Reviewed techniques for optimizing skin hydration to minimize flares.  Psoriasis Left Elbow - Posterior; Right Elbow - Posterior; Left Hand -  Anterior  Related Medications betamethasone dipropionate 0.05 % cream Apply topically 2 (two) times daily as needed (Rash).  Seborrheic keratosis Right Lower Back  Leave if stable      I, Lavonna Monarch, MD, have reviewed all documentation for this visit.  The documentation on 12/22/21 for the exam, diagnosis, procedures, and orders are all accurate and complete.

## 2021-12-26 ENCOUNTER — Ambulatory Visit: Payer: Medicare Other | Admitting: Dermatology

## 2021-12-27 ENCOUNTER — Ambulatory Visit (INDEPENDENT_AMBULATORY_CARE_PROVIDER_SITE_OTHER): Payer: Medicare Other | Admitting: Orthopedic Surgery

## 2021-12-27 DIAGNOSIS — M5410 Radiculopathy, site unspecified: Secondary | ICD-10-CM

## 2021-12-27 DIAGNOSIS — M545 Low back pain, unspecified: Secondary | ICD-10-CM

## 2021-12-27 DIAGNOSIS — G8929 Other chronic pain: Secondary | ICD-10-CM

## 2021-12-27 DIAGNOSIS — M1712 Unilateral primary osteoarthritis, left knee: Secondary | ICD-10-CM | POA: Diagnosis not present

## 2021-12-27 DIAGNOSIS — M25462 Effusion, left knee: Secondary | ICD-10-CM

## 2021-12-27 DIAGNOSIS — Z6841 Body Mass Index (BMI) 40.0 and over, adult: Secondary | ICD-10-CM

## 2021-12-27 NOTE — Progress Notes (Signed)
FOLLOW UP   Encounter Diagnoses  Name Primary?   Primary osteoarthritis of left knee Yes   Chronic pain of left knee    Radicular leg pain    Chronic bilateral low back pain, unspecified whether sciatica present    Effusion of left knee    Body mass index 45.0-49.9, adult [facility])      Chief Complaint  Patient presents with   Knee Pain    LT knee follow up      No effusion today   Full range of motion   She still wants the injection   Procedure note left knee injection   verbal consent was obtained to inject left knee joint  Timeout was completed to confirm the site of injection  The medications used were depomedrol 40 mg and 1% lidocaine 3 cc Anesthesia was provided by ethyl chloride and the skin was prepped with alcohol.  After cleaning the skin with alcohol a 20-gauge needle was used to inject the left knee joint. There were no complications. A sterile bandage was applied.

## 2022-01-10 ENCOUNTER — Other Ambulatory Visit: Payer: Self-pay | Admitting: Orthopedic Surgery

## 2022-01-10 DIAGNOSIS — M25462 Effusion, left knee: Secondary | ICD-10-CM

## 2022-01-10 DIAGNOSIS — M5410 Radiculopathy, site unspecified: Secondary | ICD-10-CM

## 2022-01-24 ENCOUNTER — Other Ambulatory Visit (HOSPITAL_COMMUNITY)
Admission: RE | Admit: 2022-01-24 | Discharge: 2022-01-24 | Disposition: A | Discharge status: Home / Self Care | Payer: Medicare Other | Source: Ambulatory Visit | Attending: Adult Health | Admitting: Adult Health

## 2022-01-24 ENCOUNTER — Encounter: Payer: Self-pay | Admitting: Adult Health

## 2022-01-24 ENCOUNTER — Ambulatory Visit (INDEPENDENT_AMBULATORY_CARE_PROVIDER_SITE_OTHER): Payer: Medicare Other | Admitting: Adult Health

## 2022-01-24 VITALS — BP 148/80 | HR 69 | Ht 63.0 in | Wt 248.0 lb

## 2022-01-24 DIAGNOSIS — Z1211 Encounter for screening for malignant neoplasm of colon: Secondary | ICD-10-CM

## 2022-01-24 DIAGNOSIS — Z01419 Encounter for gynecological examination (general) (routine) without abnormal findings: Secondary | ICD-10-CM | POA: Insufficient documentation

## 2022-01-24 DIAGNOSIS — N6312 Unspecified lump in the right breast, upper inner quadrant: Secondary | ICD-10-CM | POA: Diagnosis not present

## 2022-01-24 DIAGNOSIS — Z1151 Encounter for screening for human papillomavirus (HPV): Secondary | ICD-10-CM | POA: Diagnosis not present

## 2022-01-24 LAB — HEMOCCULT GUIAC POC 1CARD (OFFICE): Fecal Occult Blood, POC: NEGATIVE

## 2022-01-24 MED ORDER — NYSTATIN 100000 UNIT/GM EX POWD: 1.0000 | Freq: Three times a day (TID) | CUTANEOUS | 3 refills | Status: DC

## 2022-01-24 NOTE — Progress Notes (Signed)
Patient ID: [name], female   DOB: 1965/10/10, 56 y.o.   MRN: [id number] History of Present Illness:  [name] is a 56 year old white female, separated, PM in for a well woman gyn exam and pap.has knot right chest, that is tender. Husband left 2 weeks ago, she is teary. PCP is [facility]  Current Medications, Allergies, Past Medical History, Past Surgical History, Family History and Social History were reviewed in [employer] record.     Review of Systems: Patient denies any headaches, hearing loss, fatigue, blurred vision, shortness of breath, chest pain, abdominal pain, problems with bowel movements, urination, or intercourse(not active). No mood swings.  Left knee hurts needs replacement    Physical Exam:BP (!) 148/80 (BP Location: Left Arm, Patient Position: Sitting, Cuff Size: Normal)   Pulse 69   Ht '5\' 3"'$  (1.6 m)   Wt 248 lb (112.5 kg)   BMI 43.93 kg/m   General:  Well developed, well nourished, no acute distress Skin:  Warm and dry Neck:  Midline trachea, normal thyroid, good ROM, no lymphadenopathy Lungs; Clear to auscultation bilaterally Breast:  No dominant palpable mass, retraction, or nipple discharge, on the left, on the right at 1 0' clock about 10 FB from nipple there is a firm oval mass that is tender, non mobile, no retraction or nipple  Cardiovascular: Regular rate and rhythm Abdomen:  Soft, non tender, no hepatosplenomegaly Pelvic:  External genitalia is normal in appearance, no lesions.  The vagina is normal in appearance. Urethra has no lesions or masses. The cervix is bulbous.  Uterus is felt to be normal size, shape, and contour.  No adnexal masses or tenderness noted.Bladder is non tender, no masses felt. Rectal: Good sphincter tone, no polyps, or hemorrhoids felt.  Hemoccult negative. Extremities/musculoskeletal:  No swelling or varicosities noted, no clubbing or cyanosis Psych:  No mood changes, alert and cooperative,seems happy AA  is 1 Fall risk is moderate    01/24/2022    3:03 PM 08/10/2018    2:04 PM 08/10/2018    2:03 PM  Depression screen PHQ 2/9  Decreased Interest '3 3 3  '$ Down, Depressed, Hopeless '2 3 3  '$ PHQ - 2 Score '5 6 6  '$ Altered sleeping 2 3   Tired, decreased energy 0 3   Change in appetite 1 1   Feeling bad or failure about yourself  0 3   Trouble concentrating 2 3   Moving slowly or fidgety/restless 0 3   Suicidal thoughts 0 1   PHQ-9 Score 10 23   Difficult doing work/chores  Extremely dIfficult        01/24/2022    3:06 PM  GAD 7 : Generalized Anxiety Score  Nervous, Anxious, on Edge 3  Control/stop worrying 3  Worry too much - different things 3  Trouble relaxing 3  Restless 1  Easily annoyed or irritable 3  Afraid - awful might happen 0  Total GAD 7 Score 16    Upstream - 01/24/22 1459       Pregnancy Intention Screening   Does the patient want to become pregnant in the next year? No    Does the patient's partner want to become pregnant in the next year? No    Would the patient like to discuss contraceptive options today? No      Contraception Wrap Up   Current Method Female Sterilization    End Method Female Sterilization    Contraception Counseling Provided No  Examination chaperoned by Levy Pupa LPN   Impression and Plan: 1. Encounter for gynecological examination with Papanicolaou smear of cervix Pap sent Physical with PCP Pap in 3 years if normal Labs with PCP - Cytology - PAP( [place]) Has appt today today with PCP She requests rx for nystatin powder Meds ordered this encounter  Medications   nystatin (MYCOSTATIN/NYSTOP) powder    Sig: Apply 1 Application topically 3 (three) times daily.    Dispense:  60 g    Refill:  3    Order Specific Question:   Supervising Provider    Answer:   Elonda Husky, LUTHER H [[id number]]    2. Encounter for screening fecal occult blood testing Hemoccult negative  - POCT occult blood stool  3. Mass of upper inner  quadrant of right breast Diagnostic mammogram and [place] scheduled for 02/19/22 at 10:20 am at [facility]  - US BREAST LTD UNI RIGHT INC AXILLA; Future - MM DIAG BREAST TOMO BILATERAL; Future - US BREAST LTD UNI LEFT INC AXILLA; Future

## 2022-01-28 LAB — CYTOLOGY - PAP
Adequacy: ABSENT
Comment: NEGATIVE
Diagnosis: NEGATIVE
High risk HPV: NEGATIVE

## 2022-01-31 ENCOUNTER — Ambulatory Visit (INDEPENDENT_AMBULATORY_CARE_PROVIDER_SITE_OTHER): Payer: Medicare Other | Admitting: Orthopedic Surgery

## 2022-01-31 ENCOUNTER — Encounter: Payer: Self-pay | Admitting: Orthopedic Surgery

## 2022-01-31 VITALS — Ht 63.0 in | Wt 248.0 lb

## 2022-01-31 DIAGNOSIS — M25562 Pain in left knee: Secondary | ICD-10-CM

## 2022-01-31 DIAGNOSIS — M1712 Unilateral primary osteoarthritis, left knee: Secondary | ICD-10-CM

## 2022-01-31 DIAGNOSIS — G8929 Other chronic pain: Secondary | ICD-10-CM | POA: Diagnosis not present

## 2022-01-31 NOTE — Progress Notes (Signed)
Chief Complaint  Patient presents with   Knee Pain    Lt knee    The patient has requested an injection  Complaint left knee pain   Diagnosis OA left knee   After appropriate timeout for site confirmation medication confirmation,  The left  was prepped with alcohol and ethyl chloride spray.  The injection was performed at the inferior lateral portal   Medication Depomedrol 40 mg and 1% lidocaine plain   There were no complications  The patient was observed for any reactions there were none and the patient was discharged.   Return 1 month

## 2022-02-09 ENCOUNTER — Other Ambulatory Visit: Payer: Self-pay | Admitting: Orthopedic Surgery

## 2022-02-09 ENCOUNTER — Other Ambulatory Visit (INDEPENDENT_AMBULATORY_CARE_PROVIDER_SITE_OTHER): Payer: Self-pay | Admitting: Gastroenterology

## 2022-02-09 DIAGNOSIS — M25462 Effusion, left knee: Secondary | ICD-10-CM

## 2022-02-09 DIAGNOSIS — M5410 Radiculopathy, site unspecified: Secondary | ICD-10-CM

## 2022-02-19 ENCOUNTER — Ambulatory Visit (HOSPITAL_COMMUNITY)
Admission: RE | Admit: 2022-02-19 | Discharge: 2022-02-19 | Disposition: A | Discharge status: Home / Self Care | Payer: Medicare Other | Source: Ambulatory Visit | Attending: Adult Health | Admitting: Adult Health

## 2022-02-19 DIAGNOSIS — N6312 Unspecified lump in the right breast, upper inner quadrant: Secondary | ICD-10-CM

## 2022-02-28 ENCOUNTER — Ambulatory Visit: Payer: Medicare Other | Admitting: Orthopedic Surgery

## 2022-03-13 ENCOUNTER — Other Ambulatory Visit: Payer: Self-pay | Admitting: Orthopedic Surgery

## 2022-03-13 DIAGNOSIS — M25462 Effusion, left knee: Secondary | ICD-10-CM

## 2022-04-03 ENCOUNTER — Ambulatory Visit (INDEPENDENT_AMBULATORY_CARE_PROVIDER_SITE_OTHER): Payer: Medicare Other | Admitting: Orthopedic Surgery

## 2022-04-03 DIAGNOSIS — M1712 Unilateral primary osteoarthritis, left knee: Secondary | ICD-10-CM | POA: Diagnosis not present

## 2022-04-03 DIAGNOSIS — M25462 Effusion, left knee: Secondary | ICD-10-CM

## 2022-04-03 MED ORDER — METHYLPREDNISOLONE ACETATE 40 MG/ML IJ SUSP
40.0000 mg | Freq: Once | INTRAMUSCULAR | Status: AC
  Administered 2022-04-03: 40 mg via INTRA_ARTICULAR

## 2022-04-03 NOTE — Progress Notes (Signed)
Pain swelling left knee  Chronic pain swelling left knee  Recommend aspiration injection as patient is not a surgical candidate  Procedure note injection and aspiration left knee joint  Verbal consent was obtained to aspirate and inject the left knee joint   Timeout was completed to confirm the site of aspiration and injection  An 18-gauge needle was used to aspirate the left knee joint from a suprapatellar lateral approach.  The medications used were 40 mg of Depo-Medrol and 1% lidocaine 3 cc  Anesthesia was provided by ethyl chloride and the skin was prepped with alcohol.  After cleaning the skin with alcohol an 18-gauge needle was used to aspirate the right knee joint.  We obtained 12 cc of fluid CLR   We followed this by injection of 40 mg of Depo-Medrol and 3 cc 1% lidocaine.  There were no complications. A sterile bandage was applied.

## 2022-04-03 NOTE — Addendum Note (Signed)
Addended byCandice Camp on: 04/03/2022 01:38 PM   Modules accepted: Orders

## 2022-04-12 ENCOUNTER — Other Ambulatory Visit (INDEPENDENT_AMBULATORY_CARE_PROVIDER_SITE_OTHER): Payer: Self-pay | Admitting: Gastroenterology

## 2022-04-12 ENCOUNTER — Other Ambulatory Visit: Payer: Self-pay | Admitting: Orthopedic Surgery

## 2022-04-12 DIAGNOSIS — M5410 Radiculopathy, site unspecified: Secondary | ICD-10-CM

## 2022-04-15 NOTE — Telephone Encounter (Signed)
Last seen 07/02/2021

## 2022-05-01 ENCOUNTER — Encounter: Payer: Self-pay | Admitting: Orthopedic Surgery

## 2022-05-01 ENCOUNTER — Ambulatory Visit (INDEPENDENT_AMBULATORY_CARE_PROVIDER_SITE_OTHER): Payer: Medicare Other | Admitting: Orthopedic Surgery

## 2022-05-01 DIAGNOSIS — M1712 Unilateral primary osteoarthritis, left knee: Secondary | ICD-10-CM | POA: Diagnosis not present

## 2022-05-01 MED ORDER — METHYLPREDNISOLONE ACETATE 40 MG/ML IJ SUSP
40.0000 mg | Freq: Once | INTRAMUSCULAR | Status: AC
  Administered 2022-05-01: 40 mg via INTRA_ARTICULAR

## 2022-05-01 NOTE — Progress Notes (Signed)
Chief Complaint  Patient presents with   Knee Pain    Left    Encounter Diagnosis  Name Primary?   Primary osteoarthritis of left knee Yes    Requests injection only   Procedure note left knee injection   verbal consent was obtained to inject left knee joint  Timeout was completed to confirm the site of injection  The medications used were depomedrol 40 mg and 1% lidocaine 3 cc Anesthesia was provided by ethyl chloride and the skin was prepped with alcohol.  After cleaning the skin with alcohol a 20-gauge needle was used to inject the left knee joint. There were no complications. A sterile bandage was applied.

## 2022-05-09 ENCOUNTER — Other Ambulatory Visit: Payer: Self-pay | Admitting: Orthopedic Surgery

## 2022-05-09 DIAGNOSIS — M25462 Effusion, left knee: Secondary | ICD-10-CM

## 2022-05-30 ENCOUNTER — Ambulatory Visit (INDEPENDENT_AMBULATORY_CARE_PROVIDER_SITE_OTHER): Payer: Medicare Other | Admitting: Orthopedic Surgery

## 2022-05-30 ENCOUNTER — Encounter: Payer: Self-pay | Admitting: Orthopedic Surgery

## 2022-05-30 DIAGNOSIS — M1712 Unilateral primary osteoarthritis, left knee: Secondary | ICD-10-CM | POA: Diagnosis not present

## 2022-05-30 MED ORDER — METHYLPREDNISOLONE ACETATE 40 MG/ML IJ SUSP
40.0000 mg | Freq: Once | INTRAMUSCULAR | Status: AC
  Administered 2022-05-30: 40 mg via INTRA_ARTICULAR

## 2022-05-30 NOTE — Progress Notes (Signed)
FOLLOW UP   Encounter Diagnosis  Name Primary?   Primary osteoarthritis of left knee Yes     Chief Complaint  Patient presents with   Knee Pain    Left     [name] is here for her routine 4-week follow-up for left knee pain plus or minus swelling Procedure note left knee injection   verbal consent was obtained to inject left knee joint  Timeout was completed to confirm the site of injection  The medications used were depomedrol 40 mg and 1% lidocaine 3 cc Anesthesia was provided by ethyl chloride and the skin was prepped with alcohol.  After cleaning the skin with alcohol a 20-gauge needle was used to inject the left knee joint. There were no complications. A sterile bandage was applied.

## 2022-06-10 ENCOUNTER — Other Ambulatory Visit: Payer: Self-pay | Admitting: Orthopedic Surgery

## 2022-06-10 ENCOUNTER — Other Ambulatory Visit (INDEPENDENT_AMBULATORY_CARE_PROVIDER_SITE_OTHER): Payer: Self-pay | Admitting: Gastroenterology

## 2022-06-10 DIAGNOSIS — M5410 Radiculopathy, site unspecified: Secondary | ICD-10-CM

## 2022-06-10 DIAGNOSIS — M25462 Effusion, left knee: Secondary | ICD-10-CM

## 2022-06-10 DIAGNOSIS — K219 Gastro-esophageal reflux disease without esophagitis: Secondary | ICD-10-CM

## 2022-06-27 ENCOUNTER — Encounter: Payer: Self-pay | Admitting: Orthopedic Surgery

## 2022-06-27 ENCOUNTER — Ambulatory Visit (INDEPENDENT_AMBULATORY_CARE_PROVIDER_SITE_OTHER): Payer: Medicare Other | Admitting: Orthopedic Surgery

## 2022-06-27 DIAGNOSIS — G8929 Other chronic pain: Secondary | ICD-10-CM | POA: Diagnosis not present

## 2022-06-27 DIAGNOSIS — M25562 Pain in left knee: Secondary | ICD-10-CM | POA: Diagnosis not present

## 2022-06-27 DIAGNOSIS — M1712 Unilateral primary osteoarthritis, left knee: Secondary | ICD-10-CM

## 2022-06-27 MED ORDER — METHYLPREDNISOLONE ACETATE 40 MG/ML IJ SUSP: 40.0000 mg | Freq: Once | INTRAMUSCULAR | Status: AC

## 2022-06-27 MED ORDER — METHYLPREDNISOLONE ACETATE 40 MG/ML IJ SUSP
40.0000 mg | Freq: Once | INTRAMUSCULAR | Status: AC
  Administered 2022-06-27: 40 mg via INTRA_ARTICULAR

## 2022-06-27 NOTE — Addendum Note (Signed)
Addended byCandice Camp on: 06/27/2022 12:02 PM   Modules accepted: Orders

## 2022-06-27 NOTE — Progress Notes (Signed)
Chief Complaint  Patient presents with   Knee Pain    Left    Time for repeat injection  No swelling no effusion she likes the way her knee is feeling but still wants the injection  Encounter Diagnoses  Name Primary?   Primary osteoarthritis of left knee Yes   Chronic pain of left knee     Procedure note left knee injection   verbal consent was obtained to inject left knee joint  Timeout was completed to confirm the site of injection  The medications used were depomedrol 40 mg and 1% lidocaine 3 cc Anesthesia was provided by ethyl chloride and the skin was prepped with alcohol.  After cleaning the skin with alcohol a 20-gauge needle was used to inject the left knee joint. There were no complications. A sterile bandage was applied.

## 2022-06-27 NOTE — Patient Instructions (Signed)

## 2022-07-25 ENCOUNTER — Ambulatory Visit: Payer: Medicare Other | Admitting: Orthopedic Surgery

## 2022-08-08 ENCOUNTER — Ambulatory Visit (INDEPENDENT_AMBULATORY_CARE_PROVIDER_SITE_OTHER): Payer: 59 | Admitting: Orthopedic Surgery

## 2022-08-08 DIAGNOSIS — M25562 Pain in left knee: Secondary | ICD-10-CM

## 2022-08-08 DIAGNOSIS — G8929 Other chronic pain: Secondary | ICD-10-CM

## 2022-08-08 DIAGNOSIS — M1712 Unilateral primary osteoarthritis, left knee: Secondary | ICD-10-CM

## 2022-08-08 NOTE — Progress Notes (Signed)
Chief Complaint  Patient presents with   Follow-up    Recheck on left knee.    And is doing well no fluid today good range of motion just pain  She is amenable to injection which we did  Procedure note left knee injection   verbal consent was obtained to inject left knee joint  Timeout was completed to confirm the site of injection  The medications used were depomedrol 40 mg and 1% lidocaine 3 cc Anesthesia was provided by ethyl chloride and the skin was prepped with alcohol.  After cleaning the skin with alcohol a 20-gauge needle was used to inject the left knee joint. There were no complications. A sterile bandage was applied.

## 2022-08-12 ENCOUNTER — Other Ambulatory Visit: Payer: Self-pay | Admitting: Orthopedic Surgery

## 2022-08-12 DIAGNOSIS — M5410 Radiculopathy, site unspecified: Secondary | ICD-10-CM

## 2022-08-14 ENCOUNTER — Other Ambulatory Visit: Payer: Self-pay | Admitting: Orthopedic Surgery

## 2022-08-14 DIAGNOSIS — M25462 Effusion, left knee: Secondary | ICD-10-CM

## 2022-08-29 ENCOUNTER — Other Ambulatory Visit (HOSPITAL_COMMUNITY): Payer: Self-pay | Admitting: Student

## 2022-08-29 DIAGNOSIS — M546 Pain in thoracic spine: Secondary | ICD-10-CM

## 2022-09-05 ENCOUNTER — Ambulatory Visit (INDEPENDENT_AMBULATORY_CARE_PROVIDER_SITE_OTHER): Payer: 59 | Admitting: Orthopedic Surgery

## 2022-09-05 DIAGNOSIS — M1990 Unspecified osteoarthritis, unspecified site: Secondary | ICD-10-CM | POA: Diagnosis not present

## 2022-09-05 DIAGNOSIS — M25562 Pain in left knee: Secondary | ICD-10-CM | POA: Diagnosis not present

## 2022-09-05 DIAGNOSIS — M1712 Unilateral primary osteoarthritis, left knee: Secondary | ICD-10-CM

## 2022-09-05 DIAGNOSIS — M1711 Unilateral primary osteoarthritis, right knee: Secondary | ICD-10-CM

## 2022-09-05 DIAGNOSIS — M170 Bilateral primary osteoarthritis of knee: Secondary | ICD-10-CM

## 2022-09-05 DIAGNOSIS — M25561 Pain in right knee: Secondary | ICD-10-CM

## 2022-09-05 DIAGNOSIS — G8929 Other chronic pain: Secondary | ICD-10-CM

## 2022-09-05 DIAGNOSIS — M25462 Effusion, left knee: Secondary | ICD-10-CM

## 2022-09-05 MED ORDER — METHYLPREDNISOLONE ACETATE 40 MG/ML IJ SUSP
40.0000 mg | Freq: Once | INTRAMUSCULAR | Status: AC
  Administered 2022-09-05: 40 mg via INTRA_ARTICULAR

## 2022-09-05 MED ORDER — PREDNISONE 10 MG PO TABS: ORAL_TABLET | ORAL | 0 refills | Status: DC

## 2022-09-05 NOTE — Progress Notes (Signed)
Chief Complaint  Patient presents with   Follow-up    Recheck on bilateral knees.   Encounter Diagnoses  Name Primary?   Inflammatory arthritis    Effusion of left knee Yes   Primary osteoarthritis of left knee    Chronic pain of left knee    Primary osteoarthritis of right knee     Tani presents today with bilateral knee pain complaining that the left knee is swollen and she would like it drained.  She is also having increased right knee pain which she has not had for quite a while wanting an injection in there as well  The third complaint is that she has pain in both wrists left greater than right with intermittent soreness as if she sprained it but denies any injury  Examination of the left wrist  There is tenderness over the dorsum of the wrist the first compartment seems to be normal flexion extension normal TFCC area normal  Right wrist looks normal as well  She does have an effusion in the left knee it is moderate  No effusion on the right knee.  No loss of motion other than from the effusion on the left knee which blocks 25 degrees of flexion on that side  Recommend aspiration injection left knee, injection right knee, prednisone once daily as needed for episodes of inflammatory arthritis  Meds ordered this encounter  Medications   methylPREDNISolone acetate (DEPO-MEDROL) injection 40 mg   methylPREDNISolone acetate (DEPO-MEDROL) injection 40 mg   predniSONE (DELTASONE) 10 MG tablet    Sig: 1-3 daily as needed    Dispense:  42 tablet    Refill:  0   Procedure note right knee injection   verbal consent was obtained to inject right knee joint  Timeout was completed to confirm the site of injection  The medications used were depomedrol 40 mg and 1% lidocaine 3 cc Anesthesia was provided by ethyl chloride and the skin was prepped with alcohol.  After cleaning the skin with alcohol a 20-gauge needle was used to inject the right knee joint. There were no  complications. A sterile bandage was applied.   Procedure note injection and aspiration left knee joint  Verbal consent was obtained to aspirate and inject the left knee joint   Timeout was completed to confirm the site of aspiration and injection  An 18-gauge needle was used to aspirate the left knee joint from a suprapatellar lateral approach.  The medications used were 40 mg of Depo-Medrol and 1% lidocaine 3 cc  Anesthesia was provided by ethyl chloride and the skin was prepped with alcohol.  After cleaning the skin with alcohol an 18-gauge needle was used to aspirate the right knee joint.  We obtained 30 cc of fluid CLR YELL  We followed this by injection of 40 mg of Depo-Medrol and 3 cc 1% lidocaine.  There were no complications. A sterile bandage was applied.   Both knees   Meds ordered this encounter  Medications   methylPREDNISolone acetate (DEPO-MEDROL) injection 40 mg   methylPREDNISolone acetate (DEPO-MEDROL) injection 40 mg   predniSONE (DELTASONE) 10 MG tablet    Sig: 1-3 daily as needed    Dispense:  42 tablet    Refill:  0

## 2022-09-18 ENCOUNTER — Ambulatory Visit (HOSPITAL_COMMUNITY)
Admission: RE | Admit: 2022-09-18 | Discharge: 2022-09-18 | Disposition: A | Discharge status: Home / Self Care | Payer: 59 | Source: Ambulatory Visit | Attending: Student | Admitting: Student

## 2022-09-18 ENCOUNTER — Other Ambulatory Visit: Payer: Self-pay | Admitting: Radiology

## 2022-09-18 DIAGNOSIS — M546 Pain in thoracic spine: Secondary | ICD-10-CM | POA: Insufficient documentation

## 2022-09-18 MED ORDER — DICLOFENAC SODIUM 3 % EX GEL: CUTANEOUS | 5 refills | Status: DC

## 2022-09-18 NOTE — Telephone Encounter (Signed)
Wants Diclofenac gel 3% sent to [facility] She said the 1% OTC does not help  Will need a prior authorization I told her I will do the prior authorization when pharmacy starts process for it.

## 2022-09-19 ENCOUNTER — Telehealth: Payer: Self-pay | Admitting: Orthopedic Surgery

## 2022-09-19 NOTE — Telephone Encounter (Signed)
We are unable to process your request for prior authorization for DICLOFENAC GEL 3% for the above member due to [facility] has a denied request on file for DICLOFENAC GEL 3% for this member. Please refer to the appeals process outlined in the original denial or contact Prior Authorization Department at [phone] for further questions.  I will advise patient to have her primary care do the appeal I can not send through a new request.

## 2022-09-19 NOTE — Telephone Encounter (Signed)
I called her and asked her to have the other doctor do the appeal and change codes to m17.11 and m17.12 and see if that goes through she states she will.

## 2022-09-26 ENCOUNTER — Encounter: Payer: Self-pay | Admitting: Radiology

## 2022-09-30 ENCOUNTER — Telehealth (INDEPENDENT_AMBULATORY_CARE_PROVIDER_SITE_OTHER): Payer: Self-pay | Admitting: *Deleted

## 2022-09-30 NOTE — Telephone Encounter (Signed)
Patient wanted to schedule an appt to discuss gallstones that showed up on recent MRI. She has a lot of nausea and has phenergan that is prescribed by [facility]. She would like to make an appt.   Mitzie, Please call her on [phone] to schedule.

## 2022-10-01 ENCOUNTER — Other Ambulatory Visit: Payer: Self-pay | Admitting: Orthopedic Surgery

## 2022-10-01 DIAGNOSIS — M25462 Effusion, left knee: Secondary | ICD-10-CM

## 2022-10-01 DIAGNOSIS — M5410 Radiculopathy, site unspecified: Secondary | ICD-10-CM

## 2022-10-03 ENCOUNTER — Ambulatory Visit: Payer: Medicaid Other | Admitting: Orthopedic Surgery

## 2022-10-17 ENCOUNTER — Encounter: Payer: Self-pay | Admitting: Orthopedic Surgery

## 2022-10-17 ENCOUNTER — Ambulatory Visit (INDEPENDENT_AMBULATORY_CARE_PROVIDER_SITE_OTHER): Payer: 59 | Admitting: Orthopedic Surgery

## 2022-10-17 DIAGNOSIS — M25462 Effusion, left knee: Secondary | ICD-10-CM | POA: Diagnosis not present

## 2022-10-17 DIAGNOSIS — M1712 Unilateral primary osteoarthritis, left knee: Secondary | ICD-10-CM

## 2022-10-17 MED ORDER — METHYLPREDNISOLONE ACETATE 40 MG/ML IJ SUSP
40.0000 mg | Freq: Once | INTRAMUSCULAR | Status: AC
  Administered 2022-10-17: 40 mg via INTRA_ARTICULAR

## 2022-10-17 NOTE — Progress Notes (Signed)
Follow-up requests aspiration injection left knee  Encounter Diagnoses  Name Primary?   Effusion of left knee Yes   Primary osteoarthritis of left knee     Procedure note injection and aspiration left knee joint  Verbal consent was obtained to aspirate and inject the left knee joint   Timeout was completed to confirm the site of aspiration and injection  An 18-gauge needle was used to aspirate the left knee joint from a suprapatellar lateral approach.  The medications used were 40 mg of Depo-Medrol and 1% lidocaine 3 cc  Anesthesia was provided by ethyl chloride and the skin was prepped with alcohol.  After cleaning the skin with alcohol an 18-gauge needle was used to aspirate the right knee joint.  We obtained 15 cc of fluid CLR/YELL  We followed this by injection of 40 mg of Depo-Medrol and 3 cc 1% lidocaine.  There were no complications. A sterile bandage was applied.

## 2022-11-05 ENCOUNTER — Encounter: Payer: Self-pay | Admitting: General Surgery

## 2022-11-05 ENCOUNTER — Ambulatory Visit (INDEPENDENT_AMBULATORY_CARE_PROVIDER_SITE_OTHER): Payer: 59 | Admitting: General Surgery

## 2022-11-05 VITALS — BP 143/83 | HR 68 | Temp 98.5°F | Resp 16 | Ht 63.0 in | Wt 254.0 lb

## 2022-11-05 DIAGNOSIS — K8020 Calculus of gallbladder without cholecystitis without obstruction: Secondary | ICD-10-CM

## 2022-11-06 NOTE — H&P (Signed)
[name]; [id number]; 06/22/1966   HPI Patient is a 56-year-old white female who was referred to my care by Wendy Richards of gastroenterology for evaluation and treatment of biliary colic secondary to cholelithiasis.  Patient has multiple medical issues and she suffers from nausea, chronic pain, constipation, GERD, and bloating.  Her symptoms over the past few months have been worsening almost daily in nature.  She does suffer from right upper quadrant abdominal pain with radiation to the right flank.  MRI of her back incidentally found cholelithiasis.  She does not have a particular food that is a trigger food.  She denies any fever, chills, or jaundice.  She did have a workup recently for kidney stones and was found to have cholelithiasis.  Given her multiple GI issues, it was recommended that she get her gallbladder removed. Past Medical History:  Diagnosis Date   ADHD (attention deficit hyperactivity disorder)    Anxiety    Arthritis    disk disease   Barrett esophagus    Chronic pain    COPD (chronic obstructive pulmonary disease)    Depression    Dyspnea    Fibromyalgia    GERD (gastroesophageal reflux disease)    IBS   Headache    Hot flashes 04/11/2015   IBS (irritable bowel syndrome)    Obesity    Peripheral arterial disease    Plantar fasciitis of left foot    Psoriasis    elbows   PTSD (post-traumatic stress disorder)    RLQ abdominal pain 04/11/2015   Vocal cord cancer    surgery corrected the problem     Past Surgical History:  Procedure Laterality Date   ANTERIOR CERVICAL DECOMP/DISCECTOMY FUSION  08/18/2012   Procedure: ANTERIOR CERVICAL DECOMPRESSION/DISCECTOMY FUSION 2 LEVELS;  Surgeon: Randy O Kritzer, MD;  Location: [facility];  Service: Neurosurgery;  Laterality: N/A;  anterior cervical five-six,six-seven decompression fusion plating   ANTERIOR LAT LUMBAR FUSION  03/19/2012   Procedure: ANTERIOR LATERAL LUMBAR FUSION 1 LEVEL;  Surgeon: Randy O Kritzer,  MD;  Location: [facility];  Service: Neurosurgery;  Laterality: Right;  Anterolateral Lumbar Interbody Fusion, Lumbar Three-Four (Hand broke through glove and nat in the room)   BACK SURGERY     BUNIONECTOMY Left 08/04/2014   Procedure: LEFT FOOT LAPIDUS BUNION CORRECTION, LEFT SECOND TARSALMETATARSAL JOINT DEBRIDEMENT AND LEFT MODIFIED MCBRIDE BUNIONECTOMY;  Surgeon: John Hewitt, MD;  Location: [facility];  Service: Orthopedics;  Laterality: Left;   CARPAL TUNNEL RELEASE Bilateral    CHONDROPLASTY Left 12/25/2016   Procedure: CHONDROPLASTY LEFT PATELLA;  Surgeon: Harrison, Stanley E, MD;  Location: [facility];  Service: Orthopedics;  Laterality: Left;   CLEFT PALATE REPAIR     COLONOSCOPY N/A 02/16/2015   Procedure: COLONOSCOPY;  Surgeon: Najeeb U Rehman, MD;  Location: [facility];  Service: Endoscopy;  Laterality: N/A;  155   COLONOSCOPY WITH PROPOFOL N/A 05/31/2020   Procedure: COLONOSCOPY WITH PROPOFOL;  Surgeon: Rehman, Najeeb U, MD;  Location: [facility];  Service: Endoscopy;  Laterality: N/A;   ENDOMETRIAL ABLATION     ESOPHAGOGASTRODUODENOSCOPY N/A 02/16/2015   Procedure: ESOPHAGOGASTRODUODENOSCOPY (EGD);  Surgeon: Najeeb U Rehman, MD;  Location: [facility];  Service: Endoscopy;  Laterality: N/A;   ESOPHAGOGASTRODUODENOSCOPY (EGD) WITH PROPOFOL N/A 05/31/2020   Procedure: ESOPHAGOGASTRODUODENOSCOPY (EGD) WITH PROPOFOL;  Surgeon: Rehman, Najeeb U, MD;  Location: [facility];  Service: Endoscopy;  Laterality: N/A;  1230   KNEE ARTHROSCOPY WITH MEDIAL MENISECTOMY Left 12/25/2016     Procedure: KNEE ARTHROSCOPY WITH MEDIAL AND LATERAL MENISECTOMY;  Surgeon: Harrison, Stanley E, MD;  Location: [facility];  Service: Orthopedics;  Laterality: Left;   LAPAROSCOPIC TUBAL LIGATION     [facility]   MAXIMUM ACCESS (MAS)POSTERIOR LUMBAR INTERBODY FUSION (PLIF) 2 LEVEL  08/08/2016   NOSE SURGERY     SHOULDER OPEN ROTATOR CUFF REPAIR Right 04/02/2013   Procedure: ROTATOR CUFF REPAIR  SHOULDER OPEN;  Surgeon: Stanley E Harrison, MD;  Location: [facility];  Service: Orthopedics;  Laterality: Right;   THROAT SURGERY  2000   for throat cancer/no problems intubation...[facility]   TONSILLECTOMY     TUBAL LIGATION      Family History  Problem Relation Age of Onset   Diabetes Mother    Hypertension Mother    Heart disease Maternal Grandfather        sudden death   Cancer Paternal Grandmother    Cancer Paternal Grandfather    Other Daughter        allergies; scolosis   Cancer Paternal Uncle    Cancer Paternal Uncle    Cancer Paternal Uncle    Cancer Paternal Uncle     Current Outpatient Medications on File Prior to Visit  Medication Sig Dispense Refill   amphetamine-dextroamphetamine (ADDERALL) 30 MG tablet Take 15 mg by mouth 3 (three) times daily.      Aspirin-Salicylamide-Caffeine (BC HEADACHE POWDER PO) Take 1 packet by mouth daily as needed (headaches).     augmented betamethasone dipropionate (DIPROLENE-AF) 0.05 % ointment Apply 0.05 application topically 2 (two) times daily as needed (psoriasis).      betamethasone dipropionate 0.05 % cream Apply topically 2 (two) times daily as needed (Rash). 45 g 3   Cholecalciferol (VITAMIN D3 PO) Take by mouth. 5,000 daily     diclofenac (VOLTAREN) 75 MG EC tablet TAKE (1) TABLET BY MOUTH TWICE DAILY WITH MEALS. 60 tablet 5   Diclofenac Sodium 3 % GEL Apply 1 application topically as directed.     Diclofenac Sodium 3 % GEL Apply 4 grams qid 100 g 5   EPINEPHrine 0.3 mg/0.3 mL IJ SOAJ injection Inject 0.3 mg into the muscle as needed for anaphylaxis.      fexofenadine (ALLEGRA) 180 MG tablet Take 180 mg by mouth. As needed     gabapentin (NEURONTIN) 800 MG tablet TAKE 1 TABLET BY MOUTH THREE TIMES A DAY. 90 tablet 5   hydrocortisone (ANUSOL-HC) 2.5 % rectal cream Place 1 application rectally 2 (two) times daily. Apply cream up to 2 times daily after Bowel movement for 7 days, then use as needed, thereafter 28 g 2    Hyoscyamine Sulfate SL (LEVSIN/SL) 0.125 MG SUBL Place 0.125 mg under the tongue 2 (two) times daily as needed (abd pain). 60 tablet 3   linaclotide (LINZESS) 290 MCG CAPS capsule Take 1 capsule (290 mcg total) by mouth daily before breakfast. 30 capsule 1   methocarbamol (ROBAXIN) 750 MG tablet Take 750 mg by mouth 2 (two) times daily.      nystatin (MYCOSTATIN/NYSTOP) powder Apply 1 Application topically 3 (three) times daily. 60 g 3   oxyCODONE (ROXICODONE) 15 MG immediate release tablet Take 15 mg by mouth 5 (five) times daily as needed for pain.     oxyCODONE HCl 15 MG TABA Take 15 mg by mouth. Takes 5 per day     pantoprazole (PROTONIX) 40 MG tablet TAKE 1 TABLET BY MOUTH TWICE DAILY BEFORE MEALS. 180 tablet 0   predniSONE (DELTASONE) 10 MG tablet   1-3 daily as needed 42 tablet 0   promethazine (PHENERGAN) 25 MG tablet TAKE (1) TABLET BY MOUTH EVERY EIGHT HOURS AS NEEDED. 45 tablet 0   tiZANidine (ZANAFLEX) 4 MG tablet Take 4 mg by mouth at bedtime.      topiramate (TOPAMAX) 50 MG tablet Take 50 mg by mouth at bedtime.      TRINTELLIX 20 MG TABS tablet Take 20 mg by mouth daily.     varenicline (CHANTIX) 1 MG tablet Take by mouth.     XIIDRA 5 % SOLN Place 1 drop into both eyes in the morning and at bedtime.      Current Facility-Administered Medications on File Prior to Visit  Medication Dose Route Frequency Provider Last Rate Last Admin   methylPREDNISolone acetate (DEPO-MEDROL) injection 40 mg  40 mg Intra-articular Once Harrison, Stanley E, MD        Allergies  Allergen Reactions   Bee Venom Anaphylaxis   Morphine And Related Other (See Comments)    Severe headache   Adhesive [Tape] Itching    Some types of adhesive tape and paper tape rip up the skin.      Social History   Substance and Sexual Activity  Alcohol Use Yes   Comment: 08/08/2016 "few times/year"    Social History   Tobacco Use  Smoking Status Some Days   Packs/day: 0.50   Years: 30.00   Additional pack  years: 0.00   Total pack years: 15.00   Types: Cigarettes  Smokeless Tobacco Never    Review of Systems  Constitutional:  Positive for malaise/fatigue.  HENT: Negative.    Eyes: Negative.   Respiratory: Negative.    Cardiovascular: Negative.   Gastrointestinal:  Positive for nausea.  Genitourinary:  Positive for frequency.  Musculoskeletal:  Positive for back pain, joint pain and neck pain.  Neurological: Negative.   Endo/Heme/Allergies: Negative.   Psychiatric/Behavioral: Negative.      Objective   Vitals:   11/05/22 1340  BP: (!) 143/83  Pulse: 68  Resp: 16  Temp: 98.5 F (36.9 C)  SpO2: 93%    Physical Exam Vitals reviewed.  Constitutional:      Appearance: Normal appearance. She is obese. She is not ill-appearing.  HENT:     Head: Normocephalic and atraumatic.  Eyes:     General: No scleral icterus. Cardiovascular:     Rate and Rhythm: Normal rate and regular rhythm.     Heart sounds: Normal heart sounds. No murmur heard.    No friction rub. No gallop.  Pulmonary:     Effort: Pulmonary effort is normal. No respiratory distress.     Breath sounds: Normal breath sounds. No stridor. No wheezing, rhonchi or rales.  Abdominal:     General: Bowel sounds are normal. There is no distension.     Palpations: Abdomen is soft. There is no mass.     Tenderness: There is no abdominal tenderness. There is no guarding or rebound.     Hernia: No hernia is present.     Comments: Some discomfort to deep palpation in the right upper quadrant over the gallbladder.  Due to body habitus, I could not feel the liver edge.  Skin:    General: Skin is warm and dry.  Neurological:     Mental Status: She is alert and oriented to person, place, and time.     Assessment  Biliary colic, cholelithiasis Morbid obesity, multiple GI complaints, fibromyalgia Plan  Patient is scheduled for robotic assisted laparoscopic   cholecystectomy on 11/29/2022.  The risks and benefits of the procedure  including bleeding, infection, hepatobiliary injury, the possibility of an open procedure, and the possibility of not resolving all her GI complaints were fully explained to the patient, who gave informed consent. 

## 2022-11-06 NOTE — Progress Notes (Signed)
[name]; [id number]; 12/05/1965   HPI Patient is a 57 year old white female who was referred to my care by Alain HoneyWendy Richards of gastroenterology for evaluation and treatment of biliary colic secondary to cholelithiasis.  Patient has multiple medical issues and she suffers from nausea, chronic pain, constipation, GERD, and bloating.  Her symptoms over the past few months have been worsening almost daily in nature.  She does suffer from right upper quadrant abdominal pain with radiation to the right flank.  MRI of her back incidentally found cholelithiasis.  She does not have a particular food that is a trigger food.  She denies any fever, chills, or jaundice.  She did have a workup recently for kidney stones and was found to have cholelithiasis.  Given her multiple GI issues, it was recommended that she get her gallbladder removed. Past Medical History:  Diagnosis Date   ADHD (attention deficit hyperactivity disorder)    Anxiety    Arthritis    disk disease   Barrett esophagus    Chronic pain    COPD (chronic obstructive pulmonary disease)    Depression    Dyspnea    Fibromyalgia    GERD (gastroesophageal reflux disease)    IBS   Headache    Hot flashes 04/11/2015   IBS (irritable bowel syndrome)    Obesity    Peripheral arterial disease    Plantar fasciitis of left foot    Psoriasis    elbows   PTSD (post-traumatic stress disorder)    RLQ abdominal pain 04/11/2015   Vocal cord cancer    surgery corrected the problem     Past Surgical History:  Procedure Laterality Date   ANTERIOR CERVICAL DECOMP/DISCECTOMY FUSION  08/18/2012   Procedure: ANTERIOR CERVICAL DECOMPRESSION/DISCECTOMY FUSION 2 LEVELS;  Surgeon: Reinaldo Meekerandy O Kritzer, MD;  Location: [facility];  Service: Neurosurgery;  Laterality: N/A;  anterior cervical five-six,six-seven decompression fusion plating   ANTERIOR LAT LUMBAR FUSION  03/19/2012   Procedure: ANTERIOR LATERAL LUMBAR FUSION 1 LEVEL;  Surgeon: Reinaldo Meekerandy O Kritzer,  MD;  Location: [facility];  Service: Neurosurgery;  Laterality: Right;  Anterolateral Lumbar Interbody Fusion, Lumbar Three-Four (Hand broke through glove and nat in the room)   BACK SURGERY     BUNIONECTOMY Left 08/04/2014   Procedure: LEFT FOOT LAPIDUS BUNION CORRECTION, LEFT SECOND TARSALMETATARSAL JOINT DEBRIDEMENT AND LEFT MODIFIED MCBRIDE BUNIONECTOMY;  Surgeon: Toni ArthursJohn Hewitt, MD;  Location: [facility];  Service: Orthopedics;  Laterality: Left;   CARPAL TUNNEL RELEASE Bilateral    CHONDROPLASTY Left 12/25/2016   Procedure: CHONDROPLASTY LEFT PATELLA;  Surgeon: Vickki HearingHarrison, Stanley E, MD;  Location: [facility];  Service: Orthopedics;  Laterality: Left;   CLEFT PALATE REPAIR     COLONOSCOPY N/A 02/16/2015   Procedure: COLONOSCOPY;  Surgeon: Malissa HippoNajeeb U Rehman, MD;  Location: [facility];  Service: Endoscopy;  Laterality: N/A;  155   COLONOSCOPY WITH PROPOFOL N/A 05/31/2020   Procedure: COLONOSCOPY WITH PROPOFOL;  Surgeon: Malissa Hippoehman, Najeeb U, MD;  Location: [facility];  Service: Endoscopy;  Laterality: N/A;   ENDOMETRIAL ABLATION     ESOPHAGOGASTRODUODENOSCOPY N/A 02/16/2015   Procedure: ESOPHAGOGASTRODUODENOSCOPY (EGD);  Surgeon: Malissa HippoNajeeb U Rehman, MD;  Location: [facility];  Service: Endoscopy;  Laterality: N/A;   ESOPHAGOGASTRODUODENOSCOPY (EGD) WITH PROPOFOL N/A 05/31/2020   Procedure: ESOPHAGOGASTRODUODENOSCOPY (EGD) WITH PROPOFOL;  Surgeon: Malissa Hippoehman, Najeeb U, MD;  Location: [facility];  Service: Endoscopy;  Laterality: N/A;  1230   KNEE ARTHROSCOPY WITH MEDIAL MENISECTOMY Left 12/25/2016  Procedure: KNEE ARTHROSCOPY WITH MEDIAL AND LATERAL MENISECTOMY;  Surgeon: Vickki Hearing, MD;  Location: [facility];  Service: Orthopedics;  Laterality: Left;   LAPAROSCOPIC TUBAL LIGATION     [facility]   MAXIMUM ACCESS (MAS)POSTERIOR LUMBAR INTERBODY FUSION (PLIF) 2 LEVEL  08/08/2016   NOSE SURGERY     SHOULDER OPEN ROTATOR CUFF REPAIR Right 04/02/2013   Procedure: ROTATOR CUFF REPAIR  SHOULDER OPEN;  Surgeon: Vickki Hearing, MD;  Location: [facility];  Service: Orthopedics;  Laterality: Right;   THROAT SURGERY  2000   for throat cancer/no problems intubation...[facility]   TONSILLECTOMY     TUBAL LIGATION      Family History  Problem Relation Age of Onset   Diabetes Mother    Hypertension Mother    Heart disease Maternal Grandfather        sudden death   Cancer Paternal Grandmother    Cancer Paternal Grandfather    Other Daughter        allergies; scolosis   Cancer Paternal Uncle    Cancer Paternal Uncle    Cancer Paternal Uncle    Cancer Paternal Uncle     Current Outpatient Medications on File Prior to Visit  Medication Sig Dispense Refill   amphetamine-dextroamphetamine (ADDERALL) 30 MG tablet Take 15 mg by mouth 3 (three) times daily.      Aspirin-Salicylamide-Caffeine (BC HEADACHE POWDER PO) Take 1 packet by mouth daily as needed (headaches).     augmented betamethasone dipropionate (DIPROLENE-AF) 0.05 % ointment Apply 0.05 application topically 2 (two) times daily as needed (psoriasis).      betamethasone dipropionate 0.05 % cream Apply topically 2 (two) times daily as needed (Rash). 45 g 3   Cholecalciferol (VITAMIN D3 PO) Take by mouth. 5,000 daily     diclofenac (VOLTAREN) 75 MG EC tablet TAKE (1) TABLET BY MOUTH TWICE DAILY WITH MEALS. 60 tablet 5   Diclofenac Sodium 3 % GEL Apply 1 application topically as directed.     Diclofenac Sodium 3 % GEL Apply 4 grams qid 100 g 5   EPINEPHrine 0.3 mg/0.3 mL IJ SOAJ injection Inject 0.3 mg into the muscle as needed for anaphylaxis.      fexofenadine (ALLEGRA) 180 MG tablet Take 180 mg by mouth. As needed     gabapentin (NEURONTIN) 800 MG tablet TAKE 1 TABLET BY MOUTH THREE TIMES A DAY. 90 tablet 5   hydrocortisone (ANUSOL-HC) 2.5 % rectal cream Place 1 application rectally 2 (two) times daily. Apply cream up to 2 times daily after Bowel movement for 7 days, then use as needed, thereafter 28 g 2    Hyoscyamine Sulfate SL (LEVSIN/SL) 0.125 MG SUBL Place 0.125 mg under the tongue 2 (two) times daily as needed (abd pain). 60 tablet 3   linaclotide (LINZESS) 290 MCG CAPS capsule Take 1 capsule (290 mcg total) by mouth daily before breakfast. 30 capsule 1   methocarbamol (ROBAXIN) 750 MG tablet Take 750 mg by mouth 2 (two) times daily.      nystatin (MYCOSTATIN/NYSTOP) powder Apply 1 Application topically 3 (three) times daily. 60 g 3   oxyCODONE (ROXICODONE) 15 MG immediate release tablet Take 15 mg by mouth 5 (five) times daily as needed for pain.     oxyCODONE HCl 15 MG TABA Take 15 mg by mouth. Takes 5 per day     pantoprazole (PROTONIX) 40 MG tablet TAKE 1 TABLET BY MOUTH TWICE DAILY BEFORE MEALS. 180 tablet 0   predniSONE (DELTASONE) 10 MG tablet  1-3 daily as needed 42 tablet 0   promethazine (PHENERGAN) 25 MG tablet TAKE (1) TABLET BY MOUTH EVERY EIGHT HOURS AS NEEDED. 45 tablet 0   tiZANidine (ZANAFLEX) 4 MG tablet Take 4 mg by mouth at bedtime.      topiramate (TOPAMAX) 50 MG tablet Take 50 mg by mouth at bedtime.      TRINTELLIX 20 MG TABS tablet Take 20 mg by mouth daily.     varenicline (CHANTIX) 1 MG tablet Take by mouth.     XIIDRA 5 % SOLN Place 1 drop into both eyes in the morning and at bedtime.      Current Facility-Administered Medications on File Prior to Visit  Medication Dose Route Frequency Provider Last Rate Last Admin   methylPREDNISolone acetate (DEPO-MEDROL) injection 40 mg  40 mg Intra-articular Once Vickki Hearing, MD        Allergies  Allergen Reactions   Bee Venom Anaphylaxis   Morphine And Related Other (See Comments)    Severe headache   Adhesive [Tape] Itching    Some types of adhesive tape and paper tape rip up the skin.      Social History   Substance and Sexual Activity  Alcohol Use Yes   Comment: 08/08/2016 "few times/year"    Social History   Tobacco Use  Smoking Status Some Days   Packs/day: 0.50   Years: 30.00   Additional pack  years: 0.00   Total pack years: 15.00   Types: Cigarettes  Smokeless Tobacco Never    Review of Systems  Constitutional:  Positive for malaise/fatigue.  HENT: Negative.    Eyes: Negative.   Respiratory: Negative.    Cardiovascular: Negative.   Gastrointestinal:  Positive for nausea.  Genitourinary:  Positive for frequency.  Musculoskeletal:  Positive for back pain, joint pain and neck pain.  Neurological: Negative.   Endo/Heme/Allergies: Negative.   Psychiatric/Behavioral: Negative.      Objective   Vitals:   11/05/22 1340  BP: (!) 143/83  Pulse: 68  Resp: 16  Temp: 98.5 F (36.9 C)  SpO2: 93%    Physical Exam Vitals reviewed.  Constitutional:      Appearance: Normal appearance. She is obese. She is not ill-appearing.  HENT:     Head: Normocephalic and atraumatic.  Eyes:     General: No scleral icterus. Cardiovascular:     Rate and Rhythm: Normal rate and regular rhythm.     Heart sounds: Normal heart sounds. No murmur heard.    No friction rub. No gallop.  Pulmonary:     Effort: Pulmonary effort is normal. No respiratory distress.     Breath sounds: Normal breath sounds. No stridor. No wheezing, rhonchi or rales.  Abdominal:     General: Bowel sounds are normal. There is no distension.     Palpations: Abdomen is soft. There is no mass.     Tenderness: There is no abdominal tenderness. There is no guarding or rebound.     Hernia: No hernia is present.     Comments: Some discomfort to deep palpation in the right upper quadrant over the gallbladder.  Due to body habitus, I could not feel the liver edge.  Skin:    General: Skin is warm and dry.  Neurological:     Mental Status: She is alert and oriented to person, place, and time.     Assessment  Biliary colic, cholelithiasis Morbid obesity, multiple GI complaints, fibromyalgia Plan  Patient is scheduled for robotic assisted laparoscopic  cholecystectomy on 11/29/2022.  The risks and benefits of the procedure  including bleeding, infection, hepatobiliary injury, the possibility of an open procedure, and the possibility of not resolving all her GI complaints were fully explained to the patient, who gave informed consent.

## 2022-11-15 ENCOUNTER — Ambulatory Visit (INDEPENDENT_AMBULATORY_CARE_PROVIDER_SITE_OTHER): Payer: 59 | Admitting: Orthopedic Surgery

## 2022-11-15 DIAGNOSIS — M25562 Pain in left knee: Secondary | ICD-10-CM | POA: Diagnosis not present

## 2022-11-15 DIAGNOSIS — G8929 Other chronic pain: Secondary | ICD-10-CM

## 2022-11-15 DIAGNOSIS — M25462 Effusion, left knee: Secondary | ICD-10-CM

## 2022-11-15 DIAGNOSIS — M1712 Unilateral primary osteoarthritis, left knee: Secondary | ICD-10-CM

## 2022-11-15 DIAGNOSIS — M1990 Unspecified osteoarthritis, unspecified site: Secondary | ICD-10-CM

## 2022-11-15 MED ORDER — METHYLPREDNISOLONE ACETATE 40 MG/ML IJ SUSP
40.0000 mg | Freq: Once | INTRAMUSCULAR | Status: AC
  Administered 2022-11-15: 40 mg via INTRA_ARTICULAR

## 2022-11-15 NOTE — Progress Notes (Signed)
   The patient has requested an injection   Chief Complaint  Patient presents with   Joint Swelling    LEFT KNEE SWELLING      Encounter Diagnoses  Name Primary?   Effusion of left knee Yes   Primary osteoarthritis of left knee    Inflammatory arthritis    Chronic pain of left knee         After appropriate timeout for site confirmation medication confirmation,    The LEFT  was prepped with alcohol and ethyl chloride spray.  The LEFT KNEE WAS ASPIRATED : 38 CC CLR YELL  Medication Depomedrol 40 mg and 1% lidocaine plain   There were no complications  The patient was observed for any reactions there were none and the patient was discharged.

## 2022-11-26 NOTE — Patient Instructions (Signed)
[name]  11/26/2022     @PREFPERIOPPHARMACY @   Your procedure is scheduled on  11/29/2022.   Report to [facility] at  0600  A.M.   Call this number if you have problems the morning of surgery:  [phone]  If you experience any cold or flu symptoms such as cough, fever, chills, shortness of breath, etc. between now and your scheduled surgery, please notify us at the above number.   Remember:  Do not eat or drink after midnight.      Take these medicines the morning of surgery with A SIP OF WATER       allegra, gabapentin, robaxin (if needed), oxycodone(if needed), pantoprazole, prednisone, trintellix.     Do not wear jewelry, make-up or nail polish.  Do not wear lotions, powders, or perfumes, or deodorant.  Do not shave 48 hours prior to surgery.  Men may shave face and neck.  Do not bring valuables to the hospital.  [facility] is not responsible for any belongings or valuables.  Contacts, dentures or bridgework may not be worn into surgery.  Leave your suitcase in the car.  After surgery it may be brought to your room.  For patients admitted to the hospital, discharge time will be determined by your treatment team.  Patients discharged the day of surgery will not be allowed to drive home and must have someone with them for 24 hours.    Special instructions:   DO NOT smoke tobacco or vape for 24 hours before your procedure.  Please read over the following fact sheets that you were given. Coughing and Deep Breathing, Surgical Site Infection Prevention, Anesthesia Post-op Instructions, and Care and Recovery After Surgery       Minimally Invasive Cholecystectomy, Care After The following information offers guidance on how to care for yourself after your procedure. Your health care provider may also give you more specific instructions. If you have problems or questions, contact your health care provider. What can I expect after the procedure? After  the procedure, it is common to have: Pain at your incision sites. You will be given medicines to control this pain. Mild nausea or vomiting. Bloating and possible shoulder pain from the gas that was used during the procedure. Follow these instructions at home: Medicines Take over-the-counter and prescription medicines only as told by your health care provider. If you were prescribed an antibiotic medicine, take it as told by your health care provider. Do not stop using the antibiotic even if you start to feel better. Ask your health care provider if the medicine prescribed to you: Requires you to avoid driving or using machinery. Can cause constipation. You may need to take these actions to prevent or treat constipation: Drink enough fluid to keep your urine pale yellow. Take over-the-counter or prescription medicines. Eat foods that are high in fiber, such as beans, whole grains, and fresh fruits and vegetables. Limit foods that are high in fat and processed sugars, such as fried or sweet foods. Incision care  Follow instructions from your health care provider about how to take care of your incisions. Make sure you: Wash your hands with soap and water for at least 20 seconds before and after you change your bandage (dressing). If soap and water are not available, use hand sanitizer. Change your dressing as told by your health care provider. Leave stitches (sutures), skin glue, or adhesive strips in place. These skin closures may need to  be in place for 2 weeks or longer. If adhesive strip edges start to loosen and curl up, you may trim the loose edges. Do not remove adhesive strips completely unless your health care provider tells you to do that. Do not take baths, swim, or use a hot tub until your health care provider approves. Ask your health care provider if you may take showers. You may only be allowed to take sponge baths. Check your incision area every day for signs of infection. Check  for: More redness, swelling, or pain. Fluid or blood. Warmth. Pus or a bad smell. Activity Rest as told by your health care provider. Do not do activities that require a lot of effort. Avoid sitting for a long time without moving. Get up to take short walks every 1-2 hours. This is important to improve blood flow and breathing. Ask for help if you feel weak or unsteady. Do not lift anything that is heavier than 10 lb (4.5 kg), or the limit that you are told, until your health care provider says that it is safe. Do not play contact sports until your health care provider approves. Do not return to work or school until your health care provider approves. Return to your normal activities as told by your health care provider. Ask your health care provider what activities are safe for you. General instructions If you were given a sedative during the procedure, it can affect you for several hours. Do not drive or operate machinery until your health care provider says that it is safe. Keep all follow-up visits. This is important. Contact a health care provider if: You develop a rash. You have more redness, swelling, or pain around your incisions. You have fluid or blood coming from your incisions. Your incisions feel warm to the touch. You have pus or a bad smell coming from your incisions. You have a fever. One or more of your incisions breaks open. Get help right away if: You have trouble breathing. You have chest pain. You have more pain in your shoulders. You faint or feel dizzy when you stand. You have severe pain in your abdomen. You have nausea or vomiting that lasts for more than one day. You have leg pain that is new or unusual, or if it is localized to one specific spot. These symptoms may represent a serious problem that is an emergency. Do not wait to see if the symptoms will go away. Get medical help right away. Call your local emergency services (911 in the [place] Do not drive  yourself to the hospital. Summary After your procedure, it is common to have pain at the incision sites. You may also have nausea or bloating. Follow your health care provider's instructions about medicine, activity restrictions, and caring for your incision areas. Do not do activities that require a lot of effort. Contact a health care provider if you have a fever or other signs of infection, such as more redness, swelling, or pain around the incisions. Get help right away if you have chest pain, increasing pain in the shoulders, or trouble breathing. This information is not intended to replace advice given to you by your health care provider. Make sure you discuss any questions you have with your health care provider. Document Revised: 01/16/2021 Document Reviewed: 01/16/2021 Elsevier Patient Education  2023 [employer] General Anesthesia, Adult, Care After The following information offers guidance on how to care for yourself after your procedure. Your health care provider may also give you more  specific instructions. If you have problems or questions, contact your health care provider. What can I expect after the procedure? After the procedure, it is common for people to: Have pain or discomfort at the IV site. Have nausea or vomiting. Have a sore throat or hoarseness. Have trouble concentrating. Feel cold or chills. Feel weak, sleepy, or tired (fatigue). Have soreness and body aches. These can affect parts of the body that were not involved in surgery. Follow these instructions at home: For the time period you were told by your health care provider:  Rest. Do not participate in activities where you could fall or become injured. Do not drive or use machinery. Do not drink alcohol. Do not take sleeping pills or medicines that cause drowsiness. Do not make important decisions or sign legal documents. Do not take care of children on your own. General instructions Drink enough fluid  to keep your urine pale yellow. If you have sleep apnea, surgery and certain medicines can increase your risk for breathing problems. Follow instructions from your health care provider about wearing your sleep device: Anytime you are sleeping, including during daytime naps. While taking prescription pain medicines, sleeping medicines, or medicines that make you drowsy. Return to your normal activities as told by your health care provider. Ask your health care provider what activities are safe for you. Take over-the-counter and prescription medicines only as told by your health care provider. Do not use any products that contain nicotine or tobacco. These products include cigarettes, chewing tobacco, and vaping devices, such as e-cigarettes. These can delay incision healing after surgery. If you need help quitting, ask your health care provider. Contact a health care provider if: You have nausea or vomiting that does not get better with medicine. You vomit every time you eat or drink. You have pain that does not get better with medicine. You cannot urinate or have bloody urine. You develop a skin rash. You have a fever. Get help right away if: You have trouble breathing. You have chest pain. You vomit blood. These symptoms may be an emergency. Get help right away. Call 911. Do not wait to see if the symptoms will go away. Do not drive yourself to the hospital. Summary After the procedure, it is common to have a sore throat, hoarseness, nausea, vomiting, or to feel weak, sleepy, or fatigue. For the time period you were told by your health care provider, do not drive or use machinery. Get help right away if you have difficulty breathing, have chest pain, or vomit blood. These symptoms may be an emergency. This information is not intended to replace advice given to you by your health care provider. Make sure you discuss any questions you have with your health care provider. Document Revised:  10/12/2021 Document Reviewed: 10/12/2021 Elsevier Patient Education  2023 [employer] Inc. How to Use Chlorhexidine Before Surgery Chlorhexidine gluconate (CHG) is a germ-killing (antiseptic) solution that is used to clean the skin. It can get rid of the bacteria that normally live on the skin and can keep them away for about 24 hours. To clean your skin with CHG, you may be given: A CHG solution to use in the shower or as part of a sponge bath. A prepackaged cloth that contains CHG. Cleaning your skin with CHG may help lower the risk for infection: While you are staying in the intensive care unit of the hospital. If you have a vascular access, such as a central line, to provide short-term or long-term access  to your veins. If you have a catheter to drain urine from your bladder. If you are on a ventilator. A ventilator is a machine that helps you breathe by moving air in and out of your lungs. After surgery. What are the risks? Risks of using CHG include: A skin reaction. Hearing loss, if CHG gets in your ears and you have a perforated eardrum. Eye injury, if CHG gets in your eyes and is not rinsed out. The CHG product catching fire. Make sure that you avoid smoking and flames after applying CHG to your skin. Do not use CHG: If you have a chlorhexidine allergy or have previously reacted to chlorhexidine. On babies younger than 26 months of age. How to use CHG solution Use CHG only as told by your health care provider, and follow the instructions on the label. Use the full amount of CHG as directed. Usually, this is one bottle. During a shower Follow these steps when using CHG solution during a shower (unless your health care provider gives you different instructions): Start the shower. Use your normal soap and shampoo to wash your face and hair. Turn off the shower or move out of the shower stream. Pour the CHG onto a clean washcloth. Do not use any type of brush or rough-edged  sponge. Starting at your neck, lather your body down to your toes. Make sure you follow these instructions: If you will be having surgery, pay special attention to the part of your body where you will be having surgery. Scrub this area for at least 1 minute. Do not use CHG on your head or face. If the solution gets into your ears or eyes, rinse them well with water. Avoid your genital area. Avoid any areas of skin that have broken skin, cuts, or scrapes. Scrub your back and under your arms. Make sure to wash skin folds. Let the lather sit on your skin for 1-2 minutes or as long as told by your health care provider. Thoroughly rinse your entire body in the shower. Make sure that all body creases and crevices are rinsed well. Dry off with a clean towel. Do not put any substances on your body afterward--such as powder, lotion, or perfume--unless you are told to do so by your health care provider. Only use lotions that are recommended by the manufacturer. Put on clean clothes or pajamas. If it is the night before your surgery, sleep in clean sheets.  During a sponge bath Follow these steps when using CHG solution during a sponge bath (unless your health care provider gives you different instructions): Use your normal soap and shampoo to wash your face and hair. Pour the CHG onto a clean washcloth. Starting at your neck, lather your body down to your toes. Make sure you follow these instructions: If you will be having surgery, pay special attention to the part of your body where you will be having surgery. Scrub this area for at least 1 minute. Do not use CHG on your head or face. If the solution gets into your ears or eyes, rinse them well with water. Avoid your genital area. Avoid any areas of skin that have broken skin, cuts, or scrapes. Scrub your back and under your arms. Make sure to wash skin folds. Let the lather sit on your skin for 1-2 minutes or as long as told by your health care  provider. Using a different clean, wet washcloth, thoroughly rinse your entire body. Make sure that all body creases and crevices  are rinsed well. Dry off with a clean towel. Do not put any substances on your body afterward--such as powder, lotion, or perfume--unless you are told to do so by your health care provider. Only use lotions that are recommended by the manufacturer. Put on clean clothes or pajamas. If it is the night before your surgery, sleep in clean sheets. How to use CHG prepackaged cloths Only use CHG cloths as told by your health care provider, and follow the instructions on the label. Use the CHG cloth on clean, dry skin. Do not use the CHG cloth on your head or face unless your health care provider tells you to. When washing with the CHG cloth: Avoid your genital area. Avoid any areas of skin that have broken skin, cuts, or scrapes. Before surgery Follow these steps when using a CHG cloth to clean before surgery (unless your health care provider gives you different instructions): Using the CHG cloth, vigorously scrub the part of your body where you will be having surgery. Scrub using a back-and-forth motion for 3 minutes. The area on your body should be completely wet with CHG when you are done scrubbing. Do not rinse. Discard the cloth and let the area air-dry. Do not put any substances on the area afterward, such as powder, lotion, or perfume. Put on clean clothes or pajamas. If it is the night before your surgery, sleep in clean sheets.  For general bathing Follow these steps when using CHG cloths for general bathing (unless your health care provider gives you different instructions). Use a separate CHG cloth for each area of your body. Make sure you wash between any folds of skin and between your fingers and toes. Wash your body in the following order, switching to a new cloth after each step: The front of your neck, shoulders, and chest. Both of your arms, under your  arms, and your hands. Your stomach and groin area, avoiding the genitals. Your right leg and foot. Your left leg and foot. The back of your neck, your back, and your buttocks. Do not rinse. Discard the cloth and let the area air-dry. Do not put any substances on your body afterward--such as powder, lotion, or perfume--unless you are told to do so by your health care provider. Only use lotions that are recommended by the manufacturer. Put on clean clothes or pajamas. Contact a health care provider if: Your skin gets irritated after scrubbing. You have questions about using your solution or cloth. You swallow any chlorhexidine. Call your local poison control center ([phone] in the [place] Get help right away if: Your eyes itch badly, or they become very red or swollen. Your skin itches badly and is red or swollen. Your hearing changes. You have trouble seeing. You have swelling or tingling in your mouth or throat. You have trouble breathing. These symptoms may represent a serious problem that is an emergency. Do not wait to see if the symptoms will go away. Get medical help right away. Call your local emergency services (911 in the [place] Do not drive yourself to the hospital. Summary Chlorhexidine gluconate (CHG) is a germ-killing (antiseptic) solution that is used to clean the skin. Cleaning your skin with CHG may help to lower your risk for infection. You may be given CHG to use for bathing. It may be in a bottle or in a prepackaged cloth to use on your skin. Carefully follow your health care provider's instructions and the instructions on the product label. Do not use CHG  if you have a chlorhexidine allergy. Contact your health care provider if your skin gets irritated after scrubbing. This information is not intended to replace advice given to you by your health care provider. Make sure you discuss any questions you have with your health care provider. Document Revised: 11/12/2021  Document Reviewed: 09/25/2020 Elsevier Patient Education  2023 [employer].

## 2022-11-27 ENCOUNTER — Encounter (HOSPITAL_COMMUNITY)
Admission: RE | Admit: 2022-11-27 | Discharge: 2022-11-27 | Disposition: A | Discharge status: Home / Self Care | Payer: 59 | Source: Ambulatory Visit | Attending: General Surgery | Admitting: General Surgery

## 2022-11-27 ENCOUNTER — Encounter (HOSPITAL_COMMUNITY): Payer: Self-pay

## 2022-11-27 VITALS — BP 144/69 | HR 82 | Temp 98.3°F | Resp 18 | Ht 63.0 in | Wt 254.0 lb

## 2022-11-27 DIAGNOSIS — Z6841 Body Mass Index (BMI) 40.0 and over, adult: Secondary | ICD-10-CM | POA: Insufficient documentation

## 2022-11-27 DIAGNOSIS — K8020 Calculus of gallbladder without cholecystitis without obstruction: Secondary | ICD-10-CM | POA: Diagnosis not present

## 2022-11-27 DIAGNOSIS — Z01818 Encounter for other preprocedural examination: Secondary | ICD-10-CM | POA: Insufficient documentation

## 2022-11-27 LAB — COMPREHENSIVE METABOLIC PANEL
ALT: 34 U/L (ref 0–44)
AST: 23 U/L (ref 15–41)
Albumin: 3.7 g/dL (ref 3.5–5.0)
Alkaline Phosphatase: 96 U/L (ref 38–126)
Anion gap: 7 (ref 5–15)
BUN: 16 mg/dL (ref 6–20)
CO2: 24 mmol/L (ref 22–32)
Calcium: 8.7 mg/dL — ABNORMAL LOW (ref 8.9–10.3)
Chloride: 103 mmol/L (ref 98–111)
Creatinine, Ser: 0.77 mg/dL (ref 0.44–1.00)
GFR, Estimated: >60 mL/min (ref >60)
Glucose, Bld: 143 mg/dL — ABNORMAL HIGH (ref 70–99)
Potassium: 3.7 mmol/L (ref 3.5–5.1)
Sodium: 134 mmol/L — ABNORMAL LOW (ref 135–145)
Total Bilirubin: 0.5 mg/dL (ref 0.3–1.2)
Total Protein: 6.7 g/dL (ref 6.5–8.1)

## 2022-11-27 LAB — CBC WITH DIFFERENTIAL/PLATELET
Abs Immature Granulocytes: 0.03 10*3/uL (ref 0.00–0.07)
Basophils Absolute: 0 10*3/uL (ref 0.0–0.1)
Basophils Relative: 1 %
Eosinophils Absolute: 0.2 10*3/uL (ref 0.0–0.5)
Eosinophils Relative: 3 %
HCT: 39.3 % (ref 36.0–46.0)
Hemoglobin: 12.8 g/dL (ref 12.0–15.0)
Immature Granulocytes: 0 %
Lymphocytes Relative: 32 %
Lymphs Abs: 2.4 10*3/uL (ref 0.7–4.0)
MCH: 31 pg (ref 26.0–34.0)
MCHC: 32.6 g/dL (ref 30.0–36.0)
MCV: 95.2 fL (ref 80.0–100.0)
Monocytes Absolute: 0.6 10*3/uL (ref 0.1–1.0)
Monocytes Relative: 8 %
Neutro Abs: 4.1 10*3/uL (ref 1.7–7.7)
Neutrophils Relative %: 56 %
Platelets: 307 10*3/uL (ref 150–400)
RBC: 4.13 MIL/uL (ref 3.87–5.11)
RDW: 13.2 % (ref 11.5–15.5)
WBC: 7.3 10*3/uL (ref 4.0–10.5)
nRBC: 0 % (ref 0.0–0.2)

## 2022-11-29 ENCOUNTER — Ambulatory Visit (HOSPITAL_COMMUNITY): Payer: 59

## 2022-11-29 ENCOUNTER — Other Ambulatory Visit: Payer: Self-pay

## 2022-11-29 ENCOUNTER — Ambulatory Visit (HOSPITAL_COMMUNITY)
Admission: RE | Admit: 2022-11-29 | Discharge: 2022-11-29 | Disposition: A | Discharge status: Home / Self Care | Payer: 59 | Attending: General Surgery | Admitting: General Surgery

## 2022-11-29 ENCOUNTER — Encounter (HOSPITAL_COMMUNITY): Payer: Self-pay | Admitting: General Surgery

## 2022-11-29 ENCOUNTER — Encounter (HOSPITAL_COMMUNITY)
Admission: RE | Disposition: A | Discharge status: Home / Self Care | Payer: Self-pay | Source: Home / Self Care | Attending: General Surgery

## 2022-11-29 ENCOUNTER — Ambulatory Visit (HOSPITAL_BASED_OUTPATIENT_CLINIC_OR_DEPARTMENT_OTHER): Payer: 59

## 2022-11-29 DIAGNOSIS — K8020 Calculus of gallbladder without cholecystitis without obstruction: Secondary | ICD-10-CM | POA: Diagnosis present

## 2022-11-29 DIAGNOSIS — K8010 Calculus of gallbladder with chronic cholecystitis without obstruction: Secondary | ICD-10-CM | POA: Diagnosis not present

## 2022-11-29 DIAGNOSIS — F17210 Nicotine dependence, cigarettes, uncomplicated: Secondary | ICD-10-CM

## 2022-11-29 DIAGNOSIS — Z6841 Body Mass Index (BMI) 40.0 and over, adult: Secondary | ICD-10-CM | POA: Diagnosis not present

## 2022-11-29 DIAGNOSIS — I10 Essential (primary) hypertension: Secondary | ICD-10-CM

## 2022-11-29 DIAGNOSIS — I739 Peripheral vascular disease, unspecified: Secondary | ICD-10-CM | POA: Insufficient documentation

## 2022-11-29 DIAGNOSIS — J449 Chronic obstructive pulmonary disease, unspecified: Secondary | ICD-10-CM | POA: Diagnosis not present

## 2022-11-29 DIAGNOSIS — K8070 Calculus of gallbladder and bile duct without cholecystitis without obstruction: Secondary | ICD-10-CM | POA: Diagnosis not present

## 2022-11-29 DIAGNOSIS — F32A Depression, unspecified: Secondary | ICD-10-CM | POA: Insufficient documentation

## 2022-11-29 DIAGNOSIS — F419 Anxiety disorder, unspecified: Secondary | ICD-10-CM | POA: Diagnosis not present

## 2022-11-29 DIAGNOSIS — K219 Gastro-esophageal reflux disease without esophagitis: Secondary | ICD-10-CM | POA: Diagnosis not present

## 2022-11-29 DIAGNOSIS — M797 Fibromyalgia: Secondary | ICD-10-CM | POA: Diagnosis not present

## 2022-11-29 DIAGNOSIS — R519 Headache, unspecified: Secondary | ICD-10-CM | POA: Insufficient documentation

## 2022-11-29 SURGERY — CHOLECYSTECTOMY, ROBOT-ASSISTED, LAPAROSCOPIC
Anesthesia: General | Site: Abdomen

## 2022-11-29 MED ORDER — SCOPOLAMINE 1 MG/3DAYS TD PT72
MEDICATED_PATCH | TRANSDERMAL | Status: AC
  Filled 2022-11-29: qty 1

## 2022-11-29 MED ORDER — SUGAMMADEX SODIUM 200 MG/2ML IV SOLN
INTRAVENOUS | Status: DC | PRN
  Administered 2022-11-29: 200 mg via INTRAVENOUS

## 2022-11-29 MED ORDER — STERILE WATER FOR IRRIGATION IR SOLN
Status: DC | PRN
  Administered 2022-11-29: 1000 mL

## 2022-11-29 MED ORDER — PROPOFOL 10 MG/ML IV BOLUS
INTRAVENOUS | Status: AC
  Filled 2022-11-29: qty 20

## 2022-11-29 MED ORDER — EPHEDRINE SULFATE (PRESSORS) 50 MG/ML IJ SOLN
INTRAMUSCULAR | Status: DC | PRN
  Administered 2022-11-29: 7 mg via INTRAVENOUS

## 2022-11-29 MED ORDER — INDOCYANINE GREEN 25 MG IV SOLR: 7.5000 mg | Freq: Once | INTRAVENOUS | Status: AC

## 2022-11-29 MED ORDER — SCOPOLAMINE 1 MG/3DAYS TD PT72
1.0000 | MEDICATED_PATCH | Freq: Once | TRANSDERMAL | Status: DC
  Administered 2022-11-29: 1.5 mg via TRANSDERMAL

## 2022-11-29 MED ORDER — CHLORHEXIDINE GLUCONATE CLOTH 2 % EX PADS: 6.0000 | MEDICATED_PAD | Freq: Once | CUTANEOUS | Status: DC

## 2022-11-29 MED ORDER — KETOROLAC TROMETHAMINE 30 MG/ML IJ SOLN
30.0000 mg | Freq: Once | INTRAMUSCULAR | Status: AC
  Administered 2022-11-29: 30 mg via INTRAVENOUS
  Filled 2022-11-29: qty 1

## 2022-11-29 MED ORDER — MIDAZOLAM HCL 2 MG/2ML IJ SOLN
INTRAMUSCULAR | Status: AC
  Filled 2022-11-29: qty 2

## 2022-11-29 MED ORDER — BUPIVACAINE HCL (PF) 0.5 % IJ SOLN
INTRAMUSCULAR | Status: DC | PRN
  Administered 2022-11-29: 15 mL

## 2022-11-29 MED ORDER — PHENYLEPHRINE HCL-NACL 20-0.9 MG/250ML-% IV SOLN
INTRAVENOUS | Status: AC
  Filled 2022-11-29: qty 250

## 2022-11-29 MED ORDER — ONDANSETRON HCL 4 MG/2ML IJ SOLN
4.0000 mg | Freq: Once | INTRAMUSCULAR | Status: AC | PRN
  Administered 2022-11-29: 4 mg via INTRAVENOUS
  Filled 2022-11-29: qty 2

## 2022-11-29 MED ORDER — ONDANSETRON HCL 4 MG/2ML IJ SOLN
INTRAMUSCULAR | Status: DC | PRN
  Administered 2022-11-29: 4 mg via INTRAVENOUS

## 2022-11-29 MED ORDER — FENTANYL CITRATE PF 50 MCG/ML IJ SOSY
PREFILLED_SYRINGE | INTRAMUSCULAR | Status: AC
  Filled 2022-11-29: qty 1

## 2022-11-29 MED ORDER — OXYCODONE HCL 5 MG PO TABS
5.0000 mg | ORAL_TABLET | Freq: Once | ORAL | Status: AC | PRN
  Administered 2022-11-29: 5 mg via ORAL
  Filled 2022-11-29: qty 1

## 2022-11-29 MED ORDER — BUPIVACAINE HCL (PF) 0.5 % IJ SOLN
INTRAMUSCULAR | Status: AC
  Filled 2022-11-29: qty 30

## 2022-11-29 MED ORDER — CHLORHEXIDINE GLUCONATE 0.12 % MT SOLN
OROMUCOSAL | Status: AC
  Administered 2022-11-29: 15 mL via OROMUCOSAL
  Filled 2022-11-29: qty 15

## 2022-11-29 MED ORDER — DEXAMETHASONE SODIUM PHOSPHATE 10 MG/ML IJ SOLN
INTRAMUSCULAR | Status: DC | PRN
  Administered 2022-11-29: 8 mg via INTRAVENOUS

## 2022-11-29 MED ORDER — INDOCYANINE GREEN 25 MG IV SOLR
INTRAVENOUS | Status: AC
  Administered 2022-11-29: 7.5 mg via INTRAVENOUS
  Filled 2022-11-29: qty 10

## 2022-11-29 MED ORDER — CHLORHEXIDINE GLUCONATE 0.12 % MT SOLN: 15.0000 mL | Freq: Once | OROMUCOSAL | Status: AC

## 2022-11-29 MED ORDER — DEXAMETHASONE SODIUM PHOSPHATE 10 MG/ML IJ SOLN
INTRAMUSCULAR | Status: AC
  Filled 2022-11-29: qty 1

## 2022-11-29 MED ORDER — OXYCODONE HCL 5 MG/5ML PO SOLN: 5.0000 mg | Freq: Once | ORAL | Status: AC | PRN

## 2022-11-29 MED ORDER — CEFAZOLIN IN SODIUM CHLORIDE 3-0.9 GM/100ML-% IV SOLN
3.0000 g | INTRAVENOUS | Status: AC
  Administered 2022-11-29: 3 g via INTRAVENOUS
  Filled 2022-11-29: qty 100

## 2022-11-29 MED ORDER — ROCURONIUM 10MG/ML (10ML) SYRINGE FOR MEDFUSION PUMP - OPTIME
INTRAVENOUS | Status: DC | PRN
  Administered 2022-11-29: 50 mg via INTRAVENOUS

## 2022-11-29 MED ORDER — FENTANYL CITRATE (PF) 250 MCG/5ML IJ SOLN
INTRAMUSCULAR | Status: AC
  Filled 2022-11-29: qty 5

## 2022-11-29 MED ORDER — ONDANSETRON HCL 4 MG/2ML IJ SOLN
INTRAMUSCULAR | Status: AC
  Filled 2022-11-29: qty 2

## 2022-11-29 MED ORDER — FENTANYL CITRATE PF 50 MCG/ML IJ SOSY
25.0000 ug | PREFILLED_SYRINGE | INTRAMUSCULAR | Status: DC | PRN
  Administered 2022-11-29 (×3): 50 ug via INTRAVENOUS
  Filled 2022-11-29 (×2): qty 1

## 2022-11-29 MED ORDER — LIDOCAINE HCL (CARDIAC) PF 50 MG/5ML IV SOSY
PREFILLED_SYRINGE | INTRAVENOUS | Status: DC | PRN
  Administered 2022-11-29: 80 mg via INTRAVENOUS

## 2022-11-29 MED ORDER — LACTATED RINGERS IV SOLN: INTRAVENOUS | Status: DC

## 2022-11-29 MED ORDER — PROPOFOL 10 MG/ML IV BOLUS
INTRAVENOUS | Status: DC | PRN
  Administered 2022-11-29: 180 mg via INTRAVENOUS

## 2022-11-29 MED ORDER — SODIUM CHLORIDE 0.9 % IR SOLN
Status: DC | PRN
  Administered 2022-11-29: 3000 mL

## 2022-11-29 MED ORDER — FENTANYL CITRATE (PF) 100 MCG/2ML IJ SOLN
INTRAMUSCULAR | Status: DC | PRN
  Administered 2022-11-29 (×4): 50 ug via INTRAVENOUS

## 2022-11-29 MED ORDER — LIDOCAINE HCL (PF) 2 % IJ SOLN
INTRAMUSCULAR | Status: AC
  Filled 2022-11-29: qty 5

## 2022-11-29 MED ORDER — ROCURONIUM BROMIDE 10 MG/ML (PF) SYRINGE
PREFILLED_SYRINGE | INTRAVENOUS | Status: AC
  Filled 2022-11-29: qty 10

## 2022-11-29 SURGICAL SUPPLY — 43 items
ADH SKN CLS APL DERMABOND .7 (GAUZE/BANDAGES/DRESSINGS) ×1
APL PRP STRL LF DISP 70% ISPRP (MISCELLANEOUS) ×1
CAUTERY HOOK MNPLR 1.6 DVNC XI (INSTRUMENTS) ×2 IMPLANT
CHLORAPREP W/TINT 26 (MISCELLANEOUS) ×2 IMPLANT
CLIP LIGATING HEM O LOK PURPLE (MISCELLANEOUS) ×2 IMPLANT
COVER MAYO STAND XLG (MISCELLANEOUS) ×2 IMPLANT
COVER TIP SHEARS 8 DVNC (MISCELLANEOUS) ×2 IMPLANT
DEFOGGER SCOPE WARMER CLEARIFY (MISCELLANEOUS) IMPLANT
DERMABOND ADVANCED .7 DNX12 (GAUZE/BANDAGES/DRESSINGS) ×2 IMPLANT
DRAPE ARM DVNC X/XI (DISPOSABLE) ×8 IMPLANT
DRAPE COLUMN DVNC XI (DISPOSABLE) ×2 IMPLANT
DRAPE HALF SHEET 40X57 (DRAPES) ×2 IMPLANT
ELECT REM PT RETURN 9FT ADLT (ELECTROSURGICAL) ×1
ELECTRODE REM PT RTRN 9FT ADLT (ELECTROSURGICAL) ×2 IMPLANT
FORCEPS BPLR R/ABLATION 8 DVNC (INSTRUMENTS) ×2 IMPLANT
FORCEPS PROGRASP DVNC XI (FORCEP) ×2 IMPLANT
GLOVE BIOGEL PI IND STRL 7.0 (GLOVE) ×4 IMPLANT
GLOVE SURG SS PI 7.5 STRL IVOR (GLOVE) ×4 IMPLANT
GOWN STRL REUS W/TWL LRG LVL3 (GOWN DISPOSABLE) ×6 IMPLANT
GRASPER SUT TROCAR 14GX15 (MISCELLANEOUS) IMPLANT
IRRIGATOR SUCT 8 DISP DVNC XI (IRRIGATION / IRRIGATOR) IMPLANT
IV NS IRRIG 3000ML ARTHROMATIC (IV SOLUTION) IMPLANT
KIT TURNOVER KIT A (KITS) ×2 IMPLANT
MANIFOLD NEPTUNE II (INSTRUMENTS) ×2 IMPLANT
NDL HYPO 21X1.5 SAFETY (NEEDLE) ×2 IMPLANT
NDL INSUFFLATION 14GA 120MM (NEEDLE) ×2 IMPLANT
NEEDLE HYPO 21X1.5 SAFETY (NEEDLE) ×1 IMPLANT
NEEDLE INSUFFLATION 14GA 120MM (NEEDLE) ×1 IMPLANT
OBTURATOR OPTICAL STND 8 DVNC (TROCAR) ×1
OBTURATOR OPTICALSTD 8 DVNC (TROCAR) ×2 IMPLANT
PACK LAP CHOLE LZT030E (CUSTOM PROCEDURE TRAY) ×2 IMPLANT
PAD ARMBOARD 7.5X6 YLW CONV (MISCELLANEOUS) ×2 IMPLANT
PENCIL HANDSWITCHING (ELECTRODE) ×2 IMPLANT
SCISSORS MNPLR CVD DVNC XI (INSTRUMENTS) ×2 IMPLANT
SEAL CANN UNIV 5-8 DVNC XI (MISCELLANEOUS) ×8 IMPLANT
SET TUBE SMOKE EVAC HIGH FLOW (TUBING) ×2 IMPLANT
SPIKE FLUID TRANSFER (MISCELLANEOUS) ×2 IMPLANT
SUT MNCRL AB 4-0 PS2 18 (SUTURE) ×4 IMPLANT
SUT VICRYL 0 AB UR-6 (SUTURE) ×2 IMPLANT
SYR 30ML LL (SYRINGE) ×2 IMPLANT
SYS RETRIEVAL 5MM INZII UNIV (BASKET) ×1
SYSTEM RETRIEVL 5MM INZII UNIV (BASKET) ×2 IMPLANT
WATER STERILE IRR 500ML POUR (IV SOLUTION) ×2 IMPLANT

## 2022-11-29 NOTE — Op Note (Signed)
Patient:  [name]  DOB:  [birth year]  MRN:  [id number]   Preop Diagnosis: Biliary colic, cholelithiasis  Postop Diagnosis: Same  Procedure: Robotic assisted laparoscopic cholecystectomy  Surgeon: Franky Macho, MD  Anes: General endotracheal  Indications: Patient is a 57 year old white female who presents with biliary colic secondary to cholelithiasis.  The risks and benefits of the procedure including bleeding, infection, hepatobiliary injury, and the possibility of an open procedure were fully explained to the patient, who gave informed consent.  Procedure note: The patient was placed in the supine position.  After induction of general endotracheal anesthesia, the abdomen was prepped and draped using the usual sterile technique with ChloraPrep.  Surgical site confirmation was performed.  An infraumbilical incision was made down to the fascia.  A Veress needle was introduced into the abdominal cavity and confirmation of placement was done using the saline drop test.  The abdomen was then insufflated to 15 mmHg pressure.  An 8 mm trocar was introduced into the abdominal cavity under direct visualization without difficulty.  The patient was placed in reverse Trendelenburg position and an additional 8 mm trocar was placed in the left upper quadrant, right abdomen, and right flank regions.  The robot was then targeted and docked.  The liver was noted to be fatty in nature.  The gallbladder wall was very thin.  During the dissection, the gallbladder lumen was entered into.  I did retract the gallbladder in a dynamic fashion in order to provide a critical view of the triangle of Calot.  This was confirmed by firefly.  Hem-o-lok clips were placed proximally and distally on the cystic duct, and the cystic duct was divided.  This was likewise done the cystic artery.  The gallbladder was freed away from the gallbladder fossa using Bovie electrocautery.  An Endo Catch bag was used to remove the  gallbladder.  I was able to remove the stones that had spilled out of the gallbladder lumen.  The gallbladder fossa was inspected and no abnormal bleeding or bile leakage was noted.  All air was then evacuated from the abdominal cavity.  The robot was undocked and all trocars were removed.  All wounds were irrigated with normal saline.  All wounds were injected with 0.5% Sensorcaine.  The infraumbilical fascia was reapproximated using an 0 Vicryl interrupted suture.  All skin incisions were closed using a 4-0 Monocryl subcuticular suture.  Dermabond was applied.  All tape and needle counts were correct at the end of the procedure.  The patient was extubated in the operating room and transferred to PACU in stable condition.  Complications: None  EBL: Minimal  Specimen: Gallbladder

## 2022-11-29 NOTE — Anesthesia Procedure Notes (Signed)
Procedure Name: Intubation Date/Time: 11/29/2022 7:45 AM  Performed by: Moshe Salisbury, CRNAPre-anesthesia Checklist: Patient identified, Patient being monitored, Timeout performed, Emergency Drugs available and Suction available Patient Re-evaluated:Patient Re-evaluated prior to induction Oxygen Delivery Method: Circle system utilized Preoxygenation: Pre-oxygenation with 100% oxygen Induction Type: IV induction Ventilation: Mask ventilation without difficulty Laryngoscope Size: Mac and 3 Grade View: Grade I Tube type: Oral Tube size: 7.0 mm Number of attempts: 1 Airway Equipment and Method: Stylet Placement Confirmation: ETT inserted through vocal cords under direct vision, positive ETCO2 and breath sounds checked- equal and bilateral Secured at: 21 cm Tube secured with: Tape Dental Injury: Teeth and Oropharynx as per pre-operative assessment

## 2022-11-29 NOTE — Anesthesia Preprocedure Evaluation (Signed)
Anesthesia Evaluation  Patient identified by MRN, date of birth, ID band Patient awake    Reviewed: Allergy & Precautions, H&P , NPO status , Patient's Chart, lab work & pertinent test results, reviewed documented beta blocker date and time   Airway Mallampati: II  TM Distance: >3 FB Neck ROM: full    Dental no notable dental hx.    Pulmonary neg pulmonary ROS, shortness of breath, COPD, Current Smoker and Patient abstained from smoking.   Pulmonary exam normal breath sounds clear to auscultation       Cardiovascular Exercise Tolerance: Good hypertension, + Peripheral Vascular Disease  negative cardio ROS  Rhythm:regular Rate:Normal     Neuro/Psych  Headaches PSYCHIATRIC DISORDERS Anxiety Depression     Neuromuscular disease negative neurological ROS  negative psych ROS   GI/Hepatic negative GI ROS, Neg liver ROS,GERD  ,,  Endo/Other  negative endocrine ROS    Renal/GU negative Renal ROS  negative genitourinary   Musculoskeletal   Abdominal   Peds  Hematology negative hematology ROS (+)   Anesthesia Other Findings   Reproductive/Obstetrics negative OB ROS                             Anesthesia Physical Anesthesia Plan  ASA: 3  Anesthesia Plan: General and General ETT   Post-op Pain Management:    Induction:   PONV Risk Score and Plan: Ondansetron and Scopolamine patch - Pre-op  Airway Management Planned:   Additional Equipment:   Intra-op Plan:   Post-operative Plan:   Informed Consent: I have reviewed the patients History and Physical, chart, labs and discussed the procedure including the risks, benefits and alternatives for the proposed anesthesia with the patient or authorized representative who has indicated his/her understanding and acceptance.     Dental Advisory Given  Plan Discussed with: CRNA  Anesthesia Plan Comments:        Anesthesia Quick  Evaluation

## 2022-11-29 NOTE — Transfer of Care (Signed)
Immediate Anesthesia Transfer of Care Note  Patient: [name]  Procedure(s) Performed: XI ROBOTIC ASSISTED LAPAROSCOPIC CHOLECYSTECTOMY (Abdomen)  Patient Location: PACU  Anesthesia Type:General  Level of Consciousness: awake  Airway & Oxygen Therapy: Patient Spontanous Breathing  Post-op Assessment: Report given to RN  Post vital signs: Reviewed and stable  Last Vitals:  Vitals Value Taken Time  BP 151/74 11/29/22 1000  Temp 36.6 C 11/29/22 0932  Pulse 66 11/29/22 1004  Resp 14 11/29/22 1004  SpO2 100 % 11/29/22 1004  Vitals shown include unvalidated device data.  Last Pain:  Vitals:   11/29/22 0940  PainSc: 8       Patients Stated Pain Goal: 9 (11/29/22 0640)  Complications: No notable events documented.

## 2022-11-29 NOTE — Interval H&P Note (Signed)
History and Physical Interval Note:  11/29/2022 7:18 AM  [name]  has presented today for surgery, with the diagnosis of CHOLELITHIASIS.  The various methods of treatment have been discussed with the patient and family. After consideration of risks, benefits and other options for treatment, the patient has consented to  Procedure(s): XI ROBOTIC ASSISTED LAPAROSCOPIC CHOLECYSTECTOMY (N/A) as a surgical intervention.  The patient's history has been reviewed, patient examined, no change in status, stable for surgery.  I have reviewed the patient's chart and labs.  Questions were answered to the patient's satisfaction.     Franky Macho

## 2022-12-01 NOTE — Anesthesia Postprocedure Evaluation (Signed)
Anesthesia Post Note  Patient: [name]  Procedure(s) Performed: XI ROBOTIC ASSISTED LAPAROSCOPIC CHOLECYSTECTOMY (Abdomen)  Patient location during evaluation: Phase II Anesthesia Type: General Level of consciousness: awake Pain management: pain level controlled Vital Signs Assessment: post-procedure vital signs reviewed and stable Respiratory status: spontaneous breathing and respiratory function stable Cardiovascular status: blood pressure returned to baseline and stable Postop Assessment: no headache and no apparent nausea or vomiting Anesthetic complications: no Comments: Late entry   No notable events documented.   Last Vitals:  Vitals:   11/29/22 1030 11/29/22 1041  BP: (!) 156/76 (!) 156/94  Pulse:  62  Resp: 13 16  Temp:  36.5 C  SpO2:  97%    Last Pain:  Vitals:   11/29/22 1041  TempSrc: Oral  PainSc: 5                  Windell Norfolk

## 2022-12-02 ENCOUNTER — Telehealth: Payer: Self-pay | Admitting: *Deleted

## 2022-12-02 LAB — SURGICAL PATHOLOGY

## 2022-12-02 NOTE — Telephone Encounter (Signed)
Surgical Date: 11/29/2022 Procedure: XI ROBOTIC ASSISTED LAPAROSCOPIC CHOLECYSTECTOMY   Received call from patient [phone]~ telephone.   Patient reports that she is having increased pain in umbilical incision and right side incision. States that she has been trying to ice the areas with no relief.   Reports that she is taking APAP, though she is not taking IBU as it causes issues with her kidneys.   Of note, patient has Oxycodone 15mg  5x daily, Zanaflex 4mg  Q HS, Robaxin 750mg  QD, Gabapentin 800mg  TID, and diclofenac 75mg  QD that she is prescribed by pain clinic. Patient states that she has been taking more of the Oxycodone 15mg . Reports that she is taking it 6-8x daily since her surgery and requested prescription for breakthrough pain.   Advised that pain should not be as severe as to not be helped by her current pain regimen. Advised that if pain is severe, patient should go to ER for evaluation.   Verbalized understanding.

## 2022-12-06 ENCOUNTER — Encounter: Payer: 59 | Admitting: General Surgery

## 2022-12-10 ENCOUNTER — Ambulatory Visit: Payer: Medicare Other | Admitting: Dermatology

## 2022-12-16 ENCOUNTER — Ambulatory Visit (INDEPENDENT_AMBULATORY_CARE_PROVIDER_SITE_OTHER): Payer: 59 | Admitting: Orthopedic Surgery

## 2022-12-16 DIAGNOSIS — M25462 Effusion, left knee: Secondary | ICD-10-CM | POA: Diagnosis not present

## 2022-12-16 NOTE — Progress Notes (Signed)
Chief Complaint  Patient presents with   Knee Pain    L/ here for an injection and got some fluid to be drawn off.    Last bmi 44    Encounter Diagnosis  Name Primary?   Effusion of left knee Yes    And is not a candidate for knee replacement surgery secondary to multiple factors including chronic pain  Chronic back pain with deformity  Fibromyalgia  I do not think she will respond well to the usual pain management modalities that are available at [facility]  BMI 44   Procedure note injection and aspiration left knee joint  Verbal consent was obtained to aspirate and inject the left knee joint   Timeout was completed to confirm the site of aspiration and injection  An 18-gauge needle was used to aspirate the left knee joint from a suprapatellar lateral approach.  The medications used were 40 mg of Depo-Medrol and 1% lidocaine 3 cc  Anesthesia was provided by ethyl chloride and the skin was prepped with alcohol.  After cleaning the skin with alcohol an 18-gauge needle was used to aspirate the right knee joint.  We obtained 25 cc of fluid CLR YEL  We followed this by injection of 40 mg of Depo-Medrol and 3 cc 1% lidocaine.  There were no complications. A sterile bandage was applied.

## 2022-12-24 ENCOUNTER — Other Ambulatory Visit: Payer: Self-pay | Admitting: Orthopedic Surgery

## 2022-12-24 DIAGNOSIS — M1990 Unspecified osteoarthritis, unspecified site: Secondary | ICD-10-CM

## 2023-01-09 ENCOUNTER — Other Ambulatory Visit (INDEPENDENT_AMBULATORY_CARE_PROVIDER_SITE_OTHER): Payer: Self-pay | Admitting: Gastroenterology

## 2023-01-09 DIAGNOSIS — K219 Gastro-esophageal reflux disease without esophagitis: Secondary | ICD-10-CM

## 2023-01-13 ENCOUNTER — Telehealth: Payer: Self-pay

## 2023-01-13 ENCOUNTER — Encounter: Payer: Self-pay | Admitting: Orthopedic Surgery

## 2023-01-13 ENCOUNTER — Ambulatory Visit (INDEPENDENT_AMBULATORY_CARE_PROVIDER_SITE_OTHER): Payer: 59 | Admitting: Orthopedic Surgery

## 2023-01-13 VITALS — BP 131/75 | HR 67 | Ht 63.0 in | Wt 260.0 lb

## 2023-01-13 DIAGNOSIS — M25462 Effusion, left knee: Secondary | ICD-10-CM | POA: Diagnosis not present

## 2023-01-13 DIAGNOSIS — M1712 Unilateral primary osteoarthritis, left knee: Secondary | ICD-10-CM

## 2023-01-13 MED ORDER — METHYLPREDNISOLONE ACETATE 40 MG/ML IJ SUSP
40.0000 mg | Freq: Once | INTRAMUSCULAR | Status: AC
  Administered 2023-01-13: 40 mg via INTRA_ARTICULAR

## 2023-01-13 NOTE — Telephone Encounter (Signed)
VOB submitted for Durolane, left knee.  

## 2023-01-13 NOTE — Telephone Encounter (Signed)
-----   Message from Caffie Damme, RT sent at 01/13/2023 11:11 AM EDT ----- Needs hyaluronic acid injections left knee

## 2023-01-13 NOTE — Progress Notes (Signed)
Chief Complaint  Patient presents with   Knee Pain    Left knee drained and injected    Encounter Diagnoses  Name Primary?   Effusion of left knee    Primary osteoarthritis of left knee Yes     Procedure note injection and aspiration left knee joint  Verbal consent was obtained to aspirate and inject the left knee joint   Timeout was completed to confirm the site of aspiration and injection  An 18-gauge needle was used to aspirate the left knee joint from a suprapatellar lateral approach.  The medications used were 40 mg of Depo-Medrol and 1% lidocaine 3 cc  Anesthesia was provided by ethyl chloride and the skin was prepped with alcohol.  After cleaning the skin with alcohol an 18-gauge needle was used to aspirate the right knee joint.  We obtained 31 cc of fluid clr yel   We followed this by injection of 40 mg of Depo-Medrol and 3 cc 1% lidocaine.  There were no complications. A sterile bandage was applied.   Ha inj pending

## 2023-01-13 NOTE — Patient Instructions (Signed)
We will check which brand of Hyaluronic acid injections (there are several) your insurance covers. We will call you with price and schedule with you if insurance approves and you are okay with the out of pocket costs. If for any reason they will not cover or the out of pocket costs are high, we will discuss with you and let you know other options available. This process normally takes several weeks to hear back from us the insurance approval process takes time.   

## 2023-01-17 ENCOUNTER — Telehealth: Payer: Self-pay

## 2023-01-17 NOTE — Telephone Encounter (Signed)
Patient has an appointment scheduled for 02/17/2023 or may want to be seen sooner for gel injection.  All gel injection information can be located under the referrals tab. Thank you.

## 2023-01-18 ENCOUNTER — Other Ambulatory Visit (INDEPENDENT_AMBULATORY_CARE_PROVIDER_SITE_OTHER): Payer: Self-pay | Admitting: Gastroenterology

## 2023-01-18 DIAGNOSIS — K219 Gastro-esophageal reflux disease without esophagitis: Secondary | ICD-10-CM

## 2023-01-29 ENCOUNTER — Telehealth: Payer: Self-pay | Admitting: Orthopedic Surgery

## 2023-01-29 NOTE — Telephone Encounter (Signed)
Called the patient, lvm for her to call and schedule her gel injections:  Approved for Durolane, left knee [employer] Deductible has been met Patient will be responsible for 20% OOP No Co-pay No PA required

## 2023-02-12 ENCOUNTER — Telehealth: Payer: Self-pay

## 2023-02-12 NOTE — Telephone Encounter (Signed)
VOB submitted for Euflexxa, left knee  

## 2023-02-13 ENCOUNTER — Telehealth: Payer: Self-pay

## 2023-02-13 NOTE — Telephone Encounter (Signed)
FYI- PA is required for Euflexxa, left knee.  PA has been started through the Euflexxa portal.  Not sure if I will have an approval back  before Monday, but it has been started. PA pending

## 2023-02-13 NOTE — Telephone Encounter (Signed)
Okay. Thank you so much.

## 2023-02-13 NOTE — Telephone Encounter (Signed)
I called her  She will keep appointment on Monday to aspirate/ inject with Depomedrol then proceed with the Euflexxa next visit in 4 week s if it is approved  Thank you

## 2023-02-14 ENCOUNTER — Telehealth: Payer: Self-pay

## 2023-02-14 NOTE — Telephone Encounter (Signed)
Patient is approved to have Euflexxa injections for her left knee.  All information has been updated in her chart.

## 2023-02-17 ENCOUNTER — Ambulatory Visit (INDEPENDENT_AMBULATORY_CARE_PROVIDER_SITE_OTHER): Payer: 59 | Admitting: Orthopedic Surgery

## 2023-02-17 DIAGNOSIS — M25462 Effusion, left knee: Secondary | ICD-10-CM

## 2023-02-17 DIAGNOSIS — M1712 Unilateral primary osteoarthritis, left knee: Secondary | ICD-10-CM | POA: Diagnosis not present

## 2023-02-17 DIAGNOSIS — Z6841 Body Mass Index (BMI) 40.0 and over, adult: Secondary | ICD-10-CM

## 2023-02-17 DIAGNOSIS — M545 Low back pain, unspecified: Secondary | ICD-10-CM

## 2023-02-17 MED ORDER — SODIUM HYALURONATE (VISCOSUP) 20 MG/2ML IX SOSY
20.0000 mg | PREFILLED_SYRINGE | Freq: Once | INTRA_ARTICULAR | Status: AC
  Administered 2023-02-17: 20 mg via INTRA_ARTICULAR

## 2023-02-17 NOTE — Progress Notes (Signed)
   This is a follow-up appointment  Encounter Diagnoses  Name Primary?   Morbid obesity (HCC)    Body mass index 45.0-49.9, adult (HCC)    Chronic bilateral low back pain, unspecified whether sciatica present    Effusion of left knee    Primary osteoarthritis of left knee Yes     Chief Complaint  Patient presents with   Injections    Euflexa 1 of 3      She has fluid again in her left knee  We did an aspiration and an injection of the left knee obtained 20 cc of clear fluid  Procedure note for injection of hyaluronic acid   Diagnosis osteoarthritis of the knee  Verbal consent was obtained to inject the knee with HYALURONIC ACID . Timeout was completed to confirm the injection site as the left euflexxa   Knee  Ethyl chloride spray was used for anesthesia Alcohol was used to prep the skin. The infrapatellar lateral portal was used as an injection site and 1 vial of hyaluronic acid  was injected into the knee  Specific Co. Preparation: euflexxa  No complications were noted

## 2023-02-24 ENCOUNTER — Ambulatory Visit (INDEPENDENT_AMBULATORY_CARE_PROVIDER_SITE_OTHER): Payer: 59 | Admitting: Orthopedic Surgery

## 2023-02-24 DIAGNOSIS — M1712 Unilateral primary osteoarthritis, left knee: Secondary | ICD-10-CM

## 2023-02-24 MED ORDER — SODIUM HYALURONATE (VISCOSUP) 20 MG/2ML IX SOSY
20.0000 mg | PREFILLED_SYRINGE | Freq: Once | INTRA_ARTICULAR | Status: AC
  Administered 2023-02-24: 20 mg via INTRA_ARTICULAR

## 2023-02-24 NOTE — Progress Notes (Signed)
Chief Complaint  Patient presents with   Injections    Left Knee -Euflexa injection #2 of 3    Encounter Diagnosis  Name Primary?   Primary osteoarthritis of left knee Yes    The patient has consented to and requested hyaluronic acid injection   MEDICATION: euflexa 2/3  left  THE knee is prepped with alcohol and ethyl chloride  The injection is performed with a 21-gauge needle, via inferolateral approach  No complications were noted  Appropriate instructions post injection were given   1 week to get 3rd injx

## 2023-02-24 NOTE — Addendum Note (Signed)
Addended byCaffie Damme on: 02/24/2023 11:59 AM   Modules accepted: Orders

## 2023-03-04 ENCOUNTER — Ambulatory Visit: Payer: 59 | Admitting: Orthopedic Surgery

## 2023-03-04 ENCOUNTER — Encounter: Payer: Self-pay | Admitting: Orthopedic Surgery

## 2023-03-04 DIAGNOSIS — M1712 Unilateral primary osteoarthritis, left knee: Secondary | ICD-10-CM | POA: Diagnosis not present

## 2023-03-04 MED ORDER — SODIUM HYALURONATE (VISCOSUP) 20 MG/2ML IX SOSY
20.0000 mg | PREFILLED_SYRINGE | Freq: Once | INTRA_ARTICULAR | Status: AC
  Administered 2023-03-04: 20 mg via INTRA_ARTICULAR

## 2023-03-04 NOTE — Progress Notes (Signed)
Orthopaedic Note  Assessment: [name] is a 57 y.o. female with chronic left knee pain   Plan: 3rd HA injection completed today.  No complications.  She will follow up with Dr. Romeo Apple in about a month.  Procedure note injection Left knee joint   Verbal consent was obtained to inject the right knee joint  Timeout was completed to confirm the site of injection.  The skin was prepped with alcohol and ethyl chloride was sprayed at the injection site.  A 21-gauge needle was used to inject Euflexxa hyaluronic acid into the right knee using an anterolateral approach.  There were no complications. A sterile bandage was applied.     Meds ordered this encounter  Medications   Sodium Hyaluronate (Viscosup) SOSY 20 mg     Follow-up: Return in about 4 weeks (around 04/01/2023) for Follow up with Dr. Romeo Apple. XR at next visit: None  Subjective:  Chief Complaint  Patient presents with   Injections    3rd Euflexxa left knee     History of Present Illness: [name] is a 57 y.o. female who presents for the above stated procedure.   She has previously completed 2 HA injections with Dr. Romeo Apple.  No issues with previous injections.  She feels some improvement in her symptoms.  She is ready for her third and final injection.  Review of Systems: No fevers or chills No numbness or tingling No Chest Pain No shortness of breath   Objective: LMP 07/30/2013 Comment: 08/08/16  LMP checked before images taken. TRK  Physical Exam:  Alert and oriented.  No acute distress.  Mild left-sided antalgic gait.  Surgical portal incisions are healed.  No swelling of the left knee.  She has full extension, flexion beyond 110 degrees.  No point tenderness.  IMAGING: I personally ordered and reviewed the following images:  No new imaging obtained today.  Oliver Barre, MD 03/04/2023 12:03 PM

## 2023-03-24 ENCOUNTER — Other Ambulatory Visit (HOSPITAL_COMMUNITY): Payer: Self-pay | Admitting: Family Medicine

## 2023-03-24 DIAGNOSIS — Z13820 Encounter for screening for osteoporosis: Secondary | ICD-10-CM

## 2023-04-01 ENCOUNTER — Inpatient Hospital Stay (HOSPITAL_COMMUNITY): Admission: RE | Admit: 2023-04-01 | Payer: 59 | Source: Ambulatory Visit

## 2023-04-03 ENCOUNTER — Ambulatory Visit (INDEPENDENT_AMBULATORY_CARE_PROVIDER_SITE_OTHER): Payer: 59 | Admitting: Orthopedic Surgery

## 2023-04-03 DIAGNOSIS — M25462 Effusion, left knee: Secondary | ICD-10-CM | POA: Diagnosis not present

## 2023-04-03 DIAGNOSIS — Z6841 Body Mass Index (BMI) 40.0 and over, adult: Secondary | ICD-10-CM

## 2023-04-03 MED ORDER — METHYLPREDNISOLONE ACETATE 40 MG/ML IJ SUSP
40.0000 mg | Freq: Once | INTRAMUSCULAR | Status: AC
  Administered 2023-04-03: 40 mg via INTRA_ARTICULAR

## 2023-04-03 NOTE — Progress Notes (Signed)
Chief Complaint  Patient presents with   Knee Problem    Left knee pain swelling recurrent effusion    Encounter Diagnoses  Name Primary?   Body mass index 45.0-49.9, adult (HCC) Yes   Morbid obesity (HCC)    Effusion of left knee    Gabreilla comes in with pain and swelling in the left knee with recurrent effusion she would like an injection   Procedure note injection and aspiration left knee joint  Verbal consent was obtained to aspirate and inject the left knee joint   Timeout was completed to confirm the site of aspiration and injection  An 18-gauge needle was used to aspirate the left knee joint from a suprapatellar lateral approach.  The medications used were 40 mg of Depo-Medrol and 1% lidocaine 3 cc  Anesthesia was provided by ethyl chloride and the skin was prepped with alcohol.  After cleaning the skin with alcohol an 18-gauge needle was used to aspirate the right knee joint.  We obtained 12 cc of fluid CLR YEL  We followed this by injection of 40 mg of Depo-Medrol and 3 cc 1% lidocaine.  There were no complications. A sterile bandage was applied.   Needs weight loss for OA

## 2023-04-25 ENCOUNTER — Encounter (HOSPITAL_COMMUNITY): Payer: Self-pay | Admitting: Adult Health

## 2023-04-28 ENCOUNTER — Encounter: Payer: Self-pay | Admitting: Orthopedic Surgery

## 2023-04-28 ENCOUNTER — Other Ambulatory Visit (INDEPENDENT_AMBULATORY_CARE_PROVIDER_SITE_OTHER): Payer: 59

## 2023-04-28 ENCOUNTER — Ambulatory Visit (INDEPENDENT_AMBULATORY_CARE_PROVIDER_SITE_OTHER): Payer: 59 | Admitting: Orthopedic Surgery

## 2023-04-28 ENCOUNTER — Telehealth: Payer: Self-pay | Admitting: Radiology

## 2023-04-28 VITALS — Ht 63.0 in | Wt 240.0 lb

## 2023-04-28 DIAGNOSIS — M1712 Unilateral primary osteoarthritis, left knee: Secondary | ICD-10-CM | POA: Diagnosis not present

## 2023-04-28 DIAGNOSIS — M25462 Effusion, left knee: Secondary | ICD-10-CM

## 2023-04-28 MED ORDER — METHYLPREDNISOLONE ACETATE 40 MG/ML IJ SUSP
40.0000 mg | Freq: Once | INTRAMUSCULAR | Status: AC
  Administered 2023-04-28: 40 mg via INTRA_ARTICULAR

## 2023-04-28 NOTE — Progress Notes (Signed)
Follow-up visit  The patient says her knee is getting worse  Chief Complaint  Patient presents with   Knee Pain    Left/ getting worse asking for xray     57 year old female longstanding osteoarthritis left knee longstanding difficulties with her spine.  I do not think she is a good candidate for surgery because of her chronic pain issues with the spine and the difficulties we have with postop pain control  She is currently trying to lose weight she did stop smoking  Left knee effusion valgus deformity limited range of motion to 100 degrees the knee is not warm or red  X-rays show severe valgus deformity left knee with joint space narrowing grade 4 disease We did an aspiration injection  15 cc clear yellow fluid followed by injection of Depo-Medrol  I did ask her if she wants to be sent for consultation again with Dr. Turner Daniels but she says that he said she needed to lose weight and stop smoking  So we will see her in a month as we have been doing  Procedure note injection and aspiration left knee joint  Verbal consent was obtained to aspirate and inject the left knee joint   Timeout was completed to confirm the site of aspiration and injection  An 18-gauge needle was used to aspirate the left knee joint from a suprapatellar lateral approach.  The medications used were 40 mg of Depo-Medrol and 1% lidocaine 3 cc  Anesthesia was provided by ethyl chloride and the skin was prepped with alcohol.  After cleaning the skin with alcohol an 18-gauge needle was used to aspirate the right knee joint.  We obtained 15 cc of fluid clr yell  We followed this by injection of 40 mg of Depo-Medrol and 3 cc 1% lidocaine.  There were no complications. A sterile bandage was applied.

## 2023-04-28 NOTE — Telephone Encounter (Signed)
Weight today 240# [name] is aware [name] needs to lose to 225# for knee replacment  [name] is asking if [name] should go back to Dr Turner Daniels or where do you think would be best?

## 2023-04-28 NOTE — Telephone Encounter (Signed)
Wait till she loses the weight

## 2023-04-28 NOTE — Addendum Note (Signed)
Addended byCaffie Damme on: 04/28/2023 05:00 PM   Modules accepted: Orders

## 2023-04-29 NOTE — Telephone Encounter (Signed)
I called left message to advise he wants to discuss next visit, when she has gott she is closer to goal told her to call me back and we can discuss if she wants.

## 2023-05-01 ENCOUNTER — Ambulatory Visit: Payer: 59 | Admitting: Orthopedic Surgery

## 2023-05-15 ENCOUNTER — Other Ambulatory Visit: Payer: Self-pay | Admitting: Orthopedic Surgery

## 2023-05-15 DIAGNOSIS — M5410 Radiculopathy, site unspecified: Secondary | ICD-10-CM

## 2023-05-26 ENCOUNTER — Encounter: Payer: Self-pay | Admitting: Orthopedic Surgery

## 2023-05-26 ENCOUNTER — Ambulatory Visit (INDEPENDENT_AMBULATORY_CARE_PROVIDER_SITE_OTHER): Payer: 59 | Admitting: Orthopedic Surgery

## 2023-05-26 VITALS — Ht 63.0 in | Wt 238.0 lb

## 2023-05-26 DIAGNOSIS — M25562 Pain in left knee: Secondary | ICD-10-CM | POA: Diagnosis not present

## 2023-05-26 DIAGNOSIS — G8929 Other chronic pain: Secondary | ICD-10-CM | POA: Diagnosis not present

## 2023-05-26 DIAGNOSIS — M1990 Unspecified osteoarthritis, unspecified site: Secondary | ICD-10-CM

## 2023-05-26 DIAGNOSIS — M1380 Other specified arthritis, unspecified site: Secondary | ICD-10-CM

## 2023-05-26 DIAGNOSIS — M25462 Effusion, left knee: Secondary | ICD-10-CM | POA: Diagnosis not present

## 2023-05-26 DIAGNOSIS — M1712 Unilateral primary osteoarthritis, left knee: Secondary | ICD-10-CM

## 2023-05-26 DIAGNOSIS — Z6841 Body Mass Index (BMI) 40.0 and over, adult: Secondary | ICD-10-CM

## 2023-05-26 MED ORDER — METHYLPREDNISOLONE ACETATE 40 MG/ML IJ SUSP
40.0000 mg | Freq: Once | INTRAMUSCULAR | Status: AC
  Administered 2023-05-26: 40 mg via INTRA_ARTICULAR

## 2023-05-26 NOTE — Progress Notes (Signed)
Chief Complaint  Patient presents with   Knee Pain    Left/ inject     Encounter Diagnoses  Name Primary?   Chronic pain of left knee Yes   Inflammatory arthritis    Body mass index 45.0-49.9, adult (HCC)    Primary osteoarthritis of left knee    Effusion of left knee     Wt 238lbs  Procedure note injection and aspiration left knee joint  Verbal consent was obtained to aspirate and inject the left knee joint   Timeout was completed to confirm the site of aspiration and injection  An 18-gauge needle was used to aspirate the left knee joint from a suprapatellar lateral approach.  The medications used were 40 mg of Depo-Medrol and 1% lidocaine 3 cc  Anesthesia was provided by ethyl chloride and the skin was prepped with alcohol.  After cleaning the skin with alcohol an 18-gauge needle was used to aspirate the right knee joint.  We obtained 14 cc of fluid CLR YELL with cartilage   We followed this by injection of 40 mg of Depo-Medrol and 3 cc 1% lidocaine.  There were no complications. A sterile bandage was applied.

## 2023-06-30 ENCOUNTER — Encounter: Payer: Self-pay | Admitting: Orthopedic Surgery

## 2023-06-30 ENCOUNTER — Ambulatory Visit (INDEPENDENT_AMBULATORY_CARE_PROVIDER_SITE_OTHER): Payer: 59 | Admitting: Orthopedic Surgery

## 2023-06-30 VITALS — Ht 63.0 in | Wt 237.0 lb

## 2023-06-30 DIAGNOSIS — M25562 Pain in left knee: Secondary | ICD-10-CM | POA: Diagnosis not present

## 2023-06-30 DIAGNOSIS — G8929 Other chronic pain: Secondary | ICD-10-CM | POA: Diagnosis not present

## 2023-06-30 DIAGNOSIS — M25462 Effusion, left knee: Secondary | ICD-10-CM

## 2023-06-30 DIAGNOSIS — M1712 Unilateral primary osteoarthritis, left knee: Secondary | ICD-10-CM

## 2023-06-30 MED ORDER — METHYLPREDNISOLONE ACETATE 40 MG/ML IJ SUSP
40.0000 mg | Freq: Once | INTRAMUSCULAR | Status: AC
  Administered 2023-06-30: 40 mg via INTRA_ARTICULAR

## 2023-06-30 NOTE — Progress Notes (Signed)
Chief Complaint  Patient presents with   Knee Pain    Left knee pain  injection today     Encounter Diagnoses  Name Primary?   Chronic pain of left knee Yes   Primary osteoarthritis of left knee    Effusion of left knee      Follow-up routine aspiration injection left knee patient planes of more pain with cold weather  Procedure note injection and aspiration left knee joint  Verbal consent was obtained to aspirate and inject the left knee joint   Timeout was completed to confirm the site of aspiration and injection  An 18-gauge needle was used to aspirate the left knee joint from a suprapatellar lateral approach.  The medications used were 40 mg of Depo-Medrol and 1% lidocaine 3 cc  Anesthesia was provided by ethyl chloride and the skin was prepped with alcohol.  After cleaning the skin with alcohol an 18-gauge needle was used to aspirate the right knee joint.  We obtained 15 cc of fluid clr yel  We followed this by injection of 40 mg of Depo-Medrol and 3 cc 1% lidocaine.  There were no complications. A sterile bandage was applied.

## 2023-08-01 ENCOUNTER — Encounter: Payer: Self-pay | Admitting: Orthopedic Surgery

## 2023-08-01 ENCOUNTER — Ambulatory Visit (INDEPENDENT_AMBULATORY_CARE_PROVIDER_SITE_OTHER): Payer: 59 | Admitting: Orthopedic Surgery

## 2023-08-01 VITALS — Ht 63.0 in | Wt 240.0 lb

## 2023-08-01 DIAGNOSIS — M25562 Pain in left knee: Secondary | ICD-10-CM

## 2023-08-01 DIAGNOSIS — M25462 Effusion, left knee: Secondary | ICD-10-CM

## 2023-08-01 DIAGNOSIS — G8929 Other chronic pain: Secondary | ICD-10-CM

## 2023-08-01 DIAGNOSIS — M1712 Unilateral primary osteoarthritis, left knee: Secondary | ICD-10-CM

## 2023-08-01 MED ORDER — METHYLPREDNISOLONE ACETATE 40 MG/ML IJ SUSP
40.0000 mg | Freq: Once | INTRAMUSCULAR | Status: AC
  Administered 2023-08-01: 40 mg via INTRA_ARTICULAR

## 2023-08-01 NOTE — Progress Notes (Signed)
 Chief Complaint  Patient presents with   Knee Pain    left     Procedure note for injection   Chief Complaint  Patient presents with   Knee Pain    left     Encounter Diagnoses  Name Primary?   Chronic pain of left knee Yes   Primary osteoarthritis of left knee    Effusion of left knee         The patient has consented for injection of the left knee joint we also aspirated 15 cc of clear yellow fluid prior to injection  Medication: Depo-Medrol  40 mg and lidocaine  1%  Time out completed: Yes  The site of injection was cleaned with alcohol  and ethyl chloride.  The injection was given without any complications appropriate precautions were given.

## 2023-08-29 ENCOUNTER — Ambulatory Visit (INDEPENDENT_AMBULATORY_CARE_PROVIDER_SITE_OTHER): Payer: 59 | Admitting: Orthopedic Surgery

## 2023-08-29 VITALS — Ht 63.0 in | Wt 227.0 lb

## 2023-08-29 DIAGNOSIS — G8929 Other chronic pain: Secondary | ICD-10-CM | POA: Diagnosis not present

## 2023-08-29 DIAGNOSIS — M1712 Unilateral primary osteoarthritis, left knee: Secondary | ICD-10-CM

## 2023-08-29 DIAGNOSIS — M25562 Pain in left knee: Secondary | ICD-10-CM | POA: Diagnosis not present

## 2023-08-29 DIAGNOSIS — M25462 Effusion, left knee: Secondary | ICD-10-CM

## 2023-08-29 MED ORDER — METHYLPREDNISOLONE ACETATE 40 MG/ML IJ SUSP
40.0000 mg | Freq: Once | INTRAMUSCULAR | Status: AC
  Administered 2023-08-29: 40 mg via INTRA_ARTICULAR

## 2023-08-29 NOTE — Progress Notes (Signed)
Follow-up visit  Encounter Diagnoses  Name Primary?   Chronic pain of left knee Yes   Primary osteoarthritis of left knee    Effusion of left knee      [name] has now gotten down to 227 pounds  She has swelling and pain in her left knee  We aspirated and injected the left knee  Procedure note left knee injection   verbal consent was obtained to aspirate and inject left knee joint  Timeout was completed to confirm the site of injection  The medications used were depomedrol 40 mg and 1% lidocaine 3 cc Anesthesia was provided by ethyl chloride and the skin was prepped with alcohol.  After cleaning the skin with alcohol a pain-gauge needle was used to aspirate and inject inject the left knee joint. There were no complications. A sterile bandage was applied.  8 cc clear yellow fluid removed  Return in 4 weeks  Patient would like her surgery done in [place].  She talk to her pain management doctor who says that she can manage her pain medications after surgery  Goal is for her to get to 222 pounds

## 2023-09-26 ENCOUNTER — Ambulatory Visit: Payer: 59 | Admitting: Orthopedic Surgery

## 2023-09-29 ENCOUNTER — Other Ambulatory Visit: Payer: Self-pay | Admitting: Orthopedic Surgery

## 2023-09-29 DIAGNOSIS — M25462 Effusion, left knee: Secondary | ICD-10-CM

## 2023-10-13 ENCOUNTER — Encounter: Payer: Self-pay | Admitting: Orthopedic Surgery

## 2023-10-13 ENCOUNTER — Ambulatory Visit (INDEPENDENT_AMBULATORY_CARE_PROVIDER_SITE_OTHER): Admitting: Orthopedic Surgery

## 2023-10-13 VITALS — Ht 63.0 in | Wt 223.0 lb

## 2023-10-13 DIAGNOSIS — G8929 Other chronic pain: Secondary | ICD-10-CM

## 2023-10-13 DIAGNOSIS — M25462 Effusion, left knee: Secondary | ICD-10-CM

## 2023-10-13 DIAGNOSIS — M1712 Unilateral primary osteoarthritis, left knee: Secondary | ICD-10-CM | POA: Diagnosis not present

## 2023-10-13 MED ORDER — METHYLPREDNISOLONE ACETATE 40 MG/ML IJ SUSP
40.0000 mg | Freq: Once | INTRAMUSCULAR | Status: AC
  Administered 2023-10-13: 40 mg via INTRA_ARTICULAR

## 2023-10-13 NOTE — Progress Notes (Addendum)
 Chief Complaint  Patient presents with   Knee Pain    LEFT ASPIRATE AND INJECT   [name] has a lot of swelling today.  She feels the knee is very tight.  She will have a knee replacement sometime in the future and was advised that we cannot inject steroids but could aspirate the knee on an as-needed basis  Knee was prepped for aspiration and injection timeout was completed knee was cleaned with alcohol and sprayed with ethyl chloride and then aspiration of 15 cc of clear yellow fluid was performed from the superolateral approach and no complications were noted. At the time of injection we injected depomedrol 40 mg w/ lidocaine 3 cc   Will see her as usual in 4 weeks  She will continue to work on weight loss

## 2023-10-16 ENCOUNTER — Ambulatory Visit: Payer: 59 | Admitting: Orthopedic Surgery

## 2023-11-10 ENCOUNTER — Ambulatory Visit (INDEPENDENT_AMBULATORY_CARE_PROVIDER_SITE_OTHER): Admitting: Orthopedic Surgery

## 2023-11-10 VITALS — Wt 233.0 lb

## 2023-11-10 DIAGNOSIS — M25462 Effusion, left knee: Secondary | ICD-10-CM

## 2023-11-10 DIAGNOSIS — M48061 Spinal stenosis, lumbar region without neurogenic claudication: Secondary | ICD-10-CM

## 2023-11-10 DIAGNOSIS — M1712 Unilateral primary osteoarthritis, left knee: Secondary | ICD-10-CM

## 2023-11-10 DIAGNOSIS — G8929 Other chronic pain: Secondary | ICD-10-CM

## 2023-11-10 MED ORDER — METHYLPREDNISOLONE ACETATE 40 MG/ML IJ SUSP
40.0000 mg | Freq: Once | INTRAMUSCULAR | Status: AC
  Administered 2023-11-10: 40 mg via INTRA_ARTICULAR

## 2023-11-10 NOTE — Progress Notes (Signed)
 Chief Complaint  Patient presents with   Injections    Left -no change   Encounter Diagnoses  Name Primary?   Effusion of left knee Yes   Primary osteoarthritis of left knee    Chronic pain of left knee    Morbid obesity (HCC)    Degenerative lumbar spinal stenosis    Comes in for monthly aspiration injections of the left knee.  She has been doing this for years and has been controlling her pain somewhat.  However, she is now ready to progress to knee arthroplasty  We have agreed that within 90 days of knee arthroplasty we will not inject the knee with cortisone although we may aspirate the fluid  She is noted to have severe lumbar and thoracic spinal disease.  She has had surgery and surgery was considered again but was eventually decided against.  She is on chronic opioid therapy  We will need pain management specialist to help with postop pain control  Today we did an aspiration injection of the left knee we obtained 12 cc of fluid it was clear  Procedure note injection and aspiration left knee joint  Verbal consent was obtained to aspirate and inject the left knee joint   Timeout was completed to confirm the site of aspiration and injection  An 18-gauge needle was used to aspirate the left knee joint from a suprapatellar lateral approach.  The medications used were 40 mg of Depo-Medrol and 1% lidocaine 3 cc  Anesthesia was provided by ethyl chloride and the skin was prepped with alcohol.  After cleaning the skin with alcohol an 18-gauge needle was used to aspirate the right knee joint.  We obtained 12 cc of fluid CLR  We followed this by injection of 40 mg of Depo-Medrol and 3 cc 1% lidocaine.  There were no complications. A sterile bandage was applied.    NOTE:  Procedure: Bilateral L5-S1 laminotomy/foraminotomies to decompress the bilateral L5 and S1 nerve roots(the work required to do this was in addition to the work required to do the posterior lumbar  interbody fusion because of the patient's spinal stenosis, facet arthropathy. Etc. requiring a wide decompression of the nerve roots.);  L5-S1 transforaminal lumbar interbody fusion with local morselized autograft bone and Kinnex graft extender; insertion of interbody prosthesis at L5-S1 (globus peek expandable interbody prosthesis); posterior segmental instrumentation from L2 to S1 with Zimmer titanium pedicle screws and rods; pelvic fixation with iliac screws( Zimmer titanium) posterior lateral arthrodesis at L2-3, L3-4, L4-5 and L5-S1 with local morselized autograft bone, bone morphogenic protein-soaked collagen sponges and Kinnex bone graft extender.

## 2023-11-10 NOTE — Progress Notes (Signed)
   LMP 07/30/2013 Comment: 08/08/16  LMP checked before images taken. TRK  There is no height or weight on file to calculate BMI.  Chief Complaint  Patient presents with   Injections    Bilateral    No diagnosis found.  DOI/DOS/ Date: 11/10/23  Unchanged

## 2023-12-08 ENCOUNTER — Ambulatory Visit: Admitting: Orthopedic Surgery

## 2023-12-08 ENCOUNTER — Encounter: Payer: Self-pay | Admitting: Orthopedic Surgery

## 2023-12-08 DIAGNOSIS — M1712 Unilateral primary osteoarthritis, left knee: Secondary | ICD-10-CM

## 2023-12-08 DIAGNOSIS — G8929 Other chronic pain: Secondary | ICD-10-CM

## 2023-12-08 DIAGNOSIS — M25462 Effusion, left knee: Secondary | ICD-10-CM

## 2023-12-08 MED ORDER — METHYLPREDNISOLONE ACETATE 40 MG/ML IJ SUSP
40.0000 mg | Freq: Once | INTRAMUSCULAR | Status: AC
  Administered 2023-12-08: 40 mg via INTRA_ARTICULAR

## 2023-12-08 NOTE — Progress Notes (Signed)
 Chief Complaint  Patient presents with   Knee Pain    Left/ injection    POSSIBLE ASPIRATION AS WELL  LEFT KNEE   Left Knee Exam   Muscle Strength  The patient has normal left knee strength.  Other  Erythema: absent Scars: absent Sensation: normal Pulse: present Swelling: none Effusion: effusion present    Procedure note injection and aspiration left knee joint  Verbal consent was obtained to aspirate and inject the left knee joint   Timeout was completed to confirm the site of aspiration and injection  An 18-gauge needle was used to aspirate the left knee joint from a suprapatellar lateral approach.  The medications used were 40 mg of Depo-Medrol  and 1% lidocaine  3 cc  Anesthesia was provided by ethyl chloride and the skin was prepped with alcohol .  After cleaning the skin with alcohol  an 18-gauge needle was used to aspirate the right knee joint.  We obtained 15 cc of fluid CLR   We followed this by injection of 40 mg of Depo-Medrol  and 3 cc 1% lidocaine .  There were no complications. A sterile bandage was applied.

## 2023-12-30 ENCOUNTER — Other Ambulatory Visit (HOSPITAL_COMMUNITY): Payer: Self-pay | Admitting: Adult Health

## 2023-12-30 DIAGNOSIS — R600 Localized edema: Secondary | ICD-10-CM

## 2024-01-05 ENCOUNTER — Other Ambulatory Visit (HOSPITAL_COMMUNITY): Payer: Self-pay | Admitting: Adult Health

## 2024-01-05 ENCOUNTER — Encounter: Payer: Self-pay | Admitting: Orthopedic Surgery

## 2024-01-05 ENCOUNTER — Ambulatory Visit (HOSPITAL_COMMUNITY)
Admission: RE | Admit: 2024-01-05 | Discharge: 2024-01-05 | Disposition: A | Discharge status: Home / Self Care | Source: Ambulatory Visit | Attending: Adult Health | Admitting: Adult Health

## 2024-01-05 ENCOUNTER — Ambulatory Visit (INDEPENDENT_AMBULATORY_CARE_PROVIDER_SITE_OTHER): Admitting: Orthopedic Surgery

## 2024-01-05 DIAGNOSIS — M1990 Unspecified osteoarthritis, unspecified site: Secondary | ICD-10-CM | POA: Diagnosis not present

## 2024-01-05 DIAGNOSIS — R600 Localized edema: Secondary | ICD-10-CM | POA: Insufficient documentation

## 2024-01-05 DIAGNOSIS — Z1231 Encounter for screening mammogram for malignant neoplasm of breast: Secondary | ICD-10-CM

## 2024-01-05 MED ORDER — METHYLPREDNISOLONE ACETATE 40 MG/ML IJ SUSP
40.0000 mg | Freq: Once | INTRAMUSCULAR | Status: AC
  Administered 2024-01-05: 40 mg via INTRA_ARTICULAR

## 2024-01-05 MED ORDER — PREDNISONE 10 MG PO TABS
10.0000 mg | ORAL_TABLET | Freq: Every day | ORAL | 5 refills | Status: DC | PRN
Start: 2024-01-05 — End: 2024-05-20

## 2024-01-05 NOTE — Progress Notes (Signed)
 Chief Complaint  Patient presents with   Knee Pain    Both knees/ wants injection Right knee wants to discuss left total knee replacement /asking for refill of the Prednisone  using prn    Several issues today  Patient requests injection right knee history of OA chronic knee pain  We usually aspirate plus or minus injection left knee that is hurting  She has low vitamin D   Her opioid requirements are 15 mg oxycodone  with breakthrough oxycodone  managed by [facility] medical  Still at 237 pounds goal weight is 220 pounds for left total knee  Right knee no effusion injected, global tenderness is noted primarily around the joint lines range of motion preserved  Procedure note right knee injection   verbal consent was obtained to inject right knee joint  Timeout was completed to confirm the site of injection  The medications used were depomedrol 40 mg and 1% lidocaine  3 cc Anesthesia was provided by ethyl chloride and the skin was prepped with alcohol .  After cleaning the skin with alcohol  a 20-gauge needle was used to inject the right knee joint. There were no complications. A sterile bandage was applied.   Left knee effusion  Aspiration no injection of steroid  Procedure note aspiration left knee joint  Verbal consent was obtained to aspirate and inject the left knee joint   Timeout was completed to confirm the site of aspiration and injection  An 18-gauge needle was used to aspirate the left knee joint from a suprapatellar lateral approach.  The medications used were 40 mg of Depo-Medrol  and 1% lidocaine  3 cc  Anesthesia was provided by ethyl chloride and the skin was prepped with alcohol .  After cleaning the skin with alcohol  an 18-gauge needle was used to aspirate the right knee joint.  We obtained 20 cc of fluid clear and yellow  There were no complications. A sterile bandage was applied.   Plan Increase vitamin D  to normal level  Consult [facility] pain  management prior to knee replacement surgery  Recheck in a month  Prescription of prednisone   Meds ordered this encounter  Medications   methylPREDNISolone  acetate (DEPO-MEDROL ) injection 40 mg   predniSONE  (DELTASONE ) 10 MG tablet    Sig: Take 1 tablet (10 mg total) by mouth daily as needed (Pain).    Dispense:  60 tablet    Refill:  5

## 2024-01-08 ENCOUNTER — Ambulatory Visit (HOSPITAL_COMMUNITY)

## 2024-01-18 ENCOUNTER — Other Ambulatory Visit: Payer: Self-pay | Admitting: Orthopedic Surgery

## 2024-01-18 DIAGNOSIS — M5410 Radiculopathy, site unspecified: Secondary | ICD-10-CM

## 2024-01-19 ENCOUNTER — Ambulatory Visit (HOSPITAL_COMMUNITY)

## 2024-01-26 ENCOUNTER — Ambulatory Visit (HOSPITAL_COMMUNITY)
Admission: RE | Admit: 2024-01-26 | Discharge: 2024-01-26 | Disposition: A | Discharge status: Home / Self Care | Source: Ambulatory Visit | Attending: Adult Health | Admitting: Adult Health

## 2024-01-26 DIAGNOSIS — Z1231 Encounter for screening mammogram for malignant neoplasm of breast: Secondary | ICD-10-CM | POA: Diagnosis present

## 2024-01-28 ENCOUNTER — Ambulatory Visit: Payer: Self-pay | Admitting: Adult Health

## 2024-02-02 ENCOUNTER — Encounter: Payer: Self-pay | Admitting: Orthopedic Surgery

## 2024-02-02 ENCOUNTER — Ambulatory Visit (INDEPENDENT_AMBULATORY_CARE_PROVIDER_SITE_OTHER): Admitting: Orthopedic Surgery

## 2024-02-02 DIAGNOSIS — M25462 Effusion, left knee: Secondary | ICD-10-CM

## 2024-02-02 DIAGNOSIS — M1990 Unspecified osteoarthritis, unspecified site: Secondary | ICD-10-CM

## 2024-02-02 DIAGNOSIS — M1712 Unilateral primary osteoarthritis, left knee: Secondary | ICD-10-CM

## 2024-02-02 MED ORDER — METHYLPREDNISOLONE ACETATE 40 MG/ML IJ SUSP
40.0000 mg | Freq: Once | INTRAMUSCULAR | Status: AC
  Administered 2024-02-02: 40 mg via INTRA_ARTICULAR

## 2024-02-02 NOTE — Progress Notes (Signed)
 Patient presents for an aspiration injection left knee  Current weight is 232 We would like her to be at 220 for knee replacement  That seems a long way off so I am okay with injecting the knee today  She has a moderate knee effusion  I aspirated 20 cc of clear yellow fluid  She is having extreme back pain today secondary to fall  Procedure note injection and aspiration left knee joint  Verbal consent was obtained to aspirate and inject the left knee joint   Timeout was completed to confirm the site of aspiration and injection  An 18-gauge needle was used to aspirate the left knee joint from a suprapatellar lateral approach.  The medications used were 40 mg of Depo-Medrol  and 1% lidocaine  3 cc  Anesthesia was provided by ethyl chloride and the skin was prepped with alcohol .  After cleaning the skin with alcohol  an 18-gauge needle was used to aspirate the right knee joint.  We obtained 20 cc of fluid clear and yellow  We followed this by injection of 40 mg of Depo-Medrol  and 3 cc 1% lidocaine .  There were no complications. A sterile bandage was applied.    Return in 1 month

## 2024-02-02 NOTE — Progress Notes (Signed)
 [name]

## 2024-03-01 ENCOUNTER — Ambulatory Visit (INDEPENDENT_AMBULATORY_CARE_PROVIDER_SITE_OTHER): Admitting: Orthopedic Surgery

## 2024-03-01 DIAGNOSIS — M1990 Unspecified osteoarthritis, unspecified site: Secondary | ICD-10-CM

## 2024-03-01 DIAGNOSIS — M25462 Effusion, left knee: Secondary | ICD-10-CM

## 2024-03-01 NOTE — Progress Notes (Signed)
 Chief Complaint  Patient presents with   Knee Pain   Encounter Diagnoses  Name Primary?   Inflammatory arthritis Yes   Effusion of left knee      Procedure note aspiration left knee joint  Verbal consent was obtained to aspirate and inject the left knee joint   Timeout was completed to confirm the site of aspiration and injection  An 18-gauge needle was used to aspirate the left knee joint from a suprapatellar lateral approach.    Anesthesia was provided by ethyl chloride and the skin was prepped with alcohol .  After cleaning the skin with alcohol  an 18-gauge needle was used to aspirate the right knee joint.  We obtained 16 cc of fluid clear fluid  NO STEROIDS INJECTED  There were no complications. A sterile bandage was applied.

## 2024-03-03 ENCOUNTER — Telehealth: Payer: Self-pay | Admitting: Orthopedic Surgery

## 2024-03-03 DIAGNOSIS — M25462 Effusion, left knee: Secondary | ICD-10-CM

## 2024-03-03 DIAGNOSIS — M1712 Unilateral primary osteoarthritis, left knee: Secondary | ICD-10-CM

## 2024-03-03 DIAGNOSIS — G8929 Other chronic pain: Secondary | ICD-10-CM

## 2024-03-03 NOTE — Telephone Encounter (Signed)
 You discussed with patient injection in spine for her knee pain, she has friend that had this done  You mentioned [facility], do you know someone there? She is wanting a referral if possible  I also asked her to see if the [facility] office can advise her if they do this and she is checking

## 2024-03-10 NOTE — Telephone Encounter (Signed)
 I spoke to her She voiced understanding

## 2024-04-02 ENCOUNTER — Ambulatory Visit (INDEPENDENT_AMBULATORY_CARE_PROVIDER_SITE_OTHER): Admitting: Orthopedic Surgery

## 2024-04-02 DIAGNOSIS — M1712 Unilateral primary osteoarthritis, left knee: Secondary | ICD-10-CM | POA: Diagnosis not present

## 2024-04-02 DIAGNOSIS — G8929 Other chronic pain: Secondary | ICD-10-CM

## 2024-04-02 DIAGNOSIS — M25562 Pain in left knee: Secondary | ICD-10-CM

## 2024-04-02 DIAGNOSIS — M1990 Unspecified osteoarthritis, unspecified site: Secondary | ICD-10-CM

## 2024-04-02 DIAGNOSIS — M48061 Spinal stenosis, lumbar region without neurogenic claudication: Secondary | ICD-10-CM

## 2024-04-02 DIAGNOSIS — M25462 Effusion, left knee: Secondary | ICD-10-CM

## 2024-04-02 DIAGNOSIS — M5410 Radiculopathy, site unspecified: Secondary | ICD-10-CM

## 2024-04-02 MED ORDER — METHYLPREDNISOLONE ACETATE 40 MG/ML IJ SUSP
40.0000 mg | Freq: Once | INTRAMUSCULAR | Status: AC
  Administered 2024-04-02: 40 mg via INTRA_ARTICULAR

## 2024-04-02 NOTE — Progress Notes (Signed)
 Chief Complaint  Patient presents with   Injections   And I requested aspiration injection as she has usually monthly.  Knee hurting her quite significantly today  She does indicate that she is off of the opioids and on Suboxone now takes a Valium  1 mg for muscle spasms she is also having back pain  Knee is progressively going into valgus  She is going to pursue alternative treatment for knee arthritis at [facility] perhaps either the ablation therapy or the cryotherapy  Aspiration injection left knee 25 cc today Procedure note injection and aspiration left knee joint  Verbal consent was obtained to aspirate and inject the left knee joint   Timeout was completed to confirm the site of aspiration and injection  An 18-gauge needle was used to aspirate the left knee joint from a suprapatellar lateral approach.  The medications used were 40 mg of Depo-Medrol  and 1% lidocaine  3 cc  Anesthesia was provided by ethyl chloride and the skin was prepped with alcohol .  After cleaning the skin with alcohol  an 18-gauge needle was used to aspirate the right knee joint.  We obtained 25 cc of fluid clear yellow thin  We followed this by injection of 40 mg of Depo-Medrol  and 3 cc 1% lidocaine .  There were no complications. A sterile bandage was applied.

## 2024-04-11 ENCOUNTER — Other Ambulatory Visit: Payer: Self-pay | Admitting: Orthopedic Surgery

## 2024-04-11 DIAGNOSIS — M25462 Effusion, left knee: Secondary | ICD-10-CM

## 2024-04-30 ENCOUNTER — Ambulatory Visit: Admitting: Orthopedic Surgery

## 2024-04-30 ENCOUNTER — Telehealth: Payer: Self-pay | Admitting: Orthopedic Surgery

## 2024-04-30 ENCOUNTER — Encounter: Payer: Self-pay | Admitting: Orthopedic Surgery

## 2024-04-30 VITALS — Ht 63.0 in | Wt 245.0 lb

## 2024-04-30 DIAGNOSIS — M1712 Unilateral primary osteoarthritis, left knee: Secondary | ICD-10-CM | POA: Diagnosis not present

## 2024-04-30 DIAGNOSIS — G8929 Other chronic pain: Secondary | ICD-10-CM

## 2024-04-30 DIAGNOSIS — M7661 Achilles tendinitis, right leg: Secondary | ICD-10-CM | POA: Diagnosis not present

## 2024-04-30 DIAGNOSIS — M25462 Effusion, left knee: Secondary | ICD-10-CM

## 2024-04-30 MED ORDER — METHYLPREDNISOLONE ACETATE 40 MG/ML IJ SUSP
40.0000 mg | Freq: Once | INTRAMUSCULAR | Status: AC
  Administered 2024-04-30: 40 mg via INTRA_ARTICULAR

## 2024-04-30 NOTE — Progress Notes (Signed)
  Intake history:  Chief Complaint  Patient presents with   Knee Pain    Left/ has appointment [facility] for the nerve ablation to the knee   Foot Pain    Right heel is painful      Ht 5' 3 (1.6 m)   Wt 245 lb (111.1 kg)   LMP 07/30/2013 Comment: 08/08/16  LMP checked before images taken. TRK  BMI 43.40 kg/m  Body mass index is 43.4 kg/m.  Pharmacy? ______________________________________  WHAT ARE WE SEEING YOU FOR TODAY?   Left knee / right heel  How long has this bothered you? (DOI?DOS?WS?)  Heel few weeks/ knee ongoing   Was there an injury? No  Anticoag.  No  Diabetes No  Heart disease No  Hypertension Yes  SMOKING HX No  Kidney disease No  Any ALLERGIES ___________ Allergies  Allergen Reactions   Bee Venom Anaphylaxis   Morphine  And Codeine Other (See Comments)    Severe headache   Adhesive [Tape] Itching    Some types of adhesive tape and paper tape rip up the skin.     ___________________________________   Treatment:  Have you taken:  Tylenol  No  Advil  No  Had PT No  Had injection No  Other  _________________________

## 2024-04-30 NOTE — Telephone Encounter (Signed)
 Patient has appointment next week for ablation of nerve to her knee  They need her knee xrays at [facility], can you please get a disk ready for her to take with her ?

## 2024-04-30 NOTE — Progress Notes (Signed)
 Chief Complaint  Patient presents with   Knee Pain    Left/ has appointment [facility] for the nerve ablation to the knee   Foot Pain    Right heel is painful     Encounter Diagnoses  Name Primary?   Effusion of left knee Yes   Primary osteoarthritis of left knee    Chronic pain of left knee     Samara is having trouble with her right heel.  She has pain in the heel area no trauma  Left knee swelling and pain  Exam of the left knee shows joint effusion no erythema  We aspirated 25 cc and injected 40 mg of Depo-Medrol   As far as the right heel goes no plantar pain but posterior pain at the Achilles insertion the Achilles itself is nontender and there is no nodularity strength is normal Thompson test negative  Impression Achilles tendinitis insertional  Procedure note injection and aspiration left knee joint  Verbal consent was obtained to aspirate and inject the left knee joint   Timeout was completed to confirm the site of aspiration and injection  An 18-gauge needle was used to aspirate the left knee joint from a suprapatellar lateral approach.  The medications used were 40 mg of Depo-Medrol  and 1% lidocaine  3 cc  Anesthesia was provided by ethyl chloride and the skin was prepped with alcohol .  After cleaning the skin with alcohol  an 18-gauge needle was used to aspirate the right knee joint.  We obtained 25 cc of fluid CLR YELL  We followed this by injection of 40 mg of Depo-Medrol  and 3 cc 1% lidocaine .  There were no complications. A sterile bandage was applied.   Re check 1 month

## 2024-04-30 NOTE — Addendum Note (Signed)
 Addended byBETHA JENEAN GREIG LELON on: 04/30/2024 11:40 AM   Modules accepted: Orders

## 2024-05-04 ENCOUNTER — Ambulatory Visit: Attending: Physician Assistant | Admitting: Physical Therapy

## 2024-05-04 ENCOUNTER — Other Ambulatory Visit: Payer: Self-pay

## 2024-05-04 ENCOUNTER — Encounter: Payer: Self-pay | Admitting: Physical Therapy

## 2024-05-04 DIAGNOSIS — M62830 Muscle spasm of back: Secondary | ICD-10-CM | POA: Diagnosis present

## 2024-05-04 DIAGNOSIS — M546 Pain in thoracic spine: Secondary | ICD-10-CM | POA: Diagnosis present

## 2024-05-04 NOTE — Therapy (Signed)
 OUTPATIENT PHYSICAL THERAPY THORACOLUMBAR EVALUATION   Patient Name: [name] MRN: [id number] DOB:Feb 03, 1966, 58 y.o., female Today's Date: 05/04/2024  END OF SESSION:  PT End of Session - 05/04/24 1502     Visit Number 1    Number of Visits 12    Date for Recertification  06/15/24    PT Start Time 0240    PT Stop Time 0322    PT Time Calculation (min) 42 min    Activity Tolerance Patient tolerated treatment well    Behavior During Therapy [facility] for tasks assessed/performed          Past Medical History:  Diagnosis Date   ADHD (attention deficit hyperactivity disorder)    Anxiety    Arthritis    disk disease   Barrett esophagus    Chronic pain    COPD (chronic obstructive pulmonary disease) (HCC)    Depression    Dyspnea    Fibromyalgia    GERD (gastroesophageal reflux disease)    IBS   Headache    Hot flashes 04/11/2015   IBS (irritable bowel syndrome)    Obesity    Peripheral arterial disease    Plantar fasciitis of left foot    Psoriasis    elbows   PTSD (post-traumatic stress disorder)    RLQ abdominal pain 04/11/2015   Vocal cord cancer (HCC)    surgery corrected the problem    Past Surgical History:  Procedure Laterality Date   ANTERIOR CERVICAL DECOMP/DISCECTOMY FUSION  08/18/2012   Procedure: ANTERIOR CERVICAL DECOMPRESSION/DISCECTOMY FUSION 2 LEVELS;  Surgeon: Darina MALVA Boehringer, MD;  Location: [facility];  Service: Neurosurgery;  Laterality: N/A;  anterior cervical five-six,six-seven decompression fusion plating   ANTERIOR LAT LUMBAR FUSION  03/19/2012   Procedure: ANTERIOR LATERAL LUMBAR FUSION 1 LEVEL;  Surgeon: Darina MALVA Boehringer, MD;  Location: [facility];  Service: Neurosurgery;  Laterality: Right;  Anterolateral Lumbar Interbody Fusion, Lumbar Three-Four (Hand broke through glove and nat in the room)   BACK SURGERY     BUNIONECTOMY Left 08/04/2014   Procedure: LEFT FOOT LAPIDUS BUNION CORRECTION, LEFT SECOND TARSALMETATARSAL JOINT DEBRIDEMENT  AND LEFT MODIFIED MCBRIDE BUNIONECTOMY;  Surgeon: Norleen Armor, MD;  Location:  SURGERY CENTER;  Service: Orthopedics;  Laterality: Left;   CARPAL TUNNEL RELEASE Bilateral    CHONDROPLASTY Left 12/25/2016   Procedure: CHONDROPLASTY LEFT PATELLA;  Surgeon: Margrette Taft BRAVO, MD;  Location: [facility];  Service: Orthopedics;  Laterality: Left;   CLEFT PALATE REPAIR     COLONOSCOPY N/A 02/16/2015   Procedure: COLONOSCOPY;  Surgeon: Claudis RAYMOND Rivet, MD;  Location: [facility];  Service: Endoscopy;  Laterality: N/A;  155   COLONOSCOPY WITH PROPOFOL  N/A 05/31/2020   Procedure: COLONOSCOPY WITH PROPOFOL ;  Surgeon: Rivet Claudis RAYMOND, MD;  Location: [facility];  Service: Endoscopy;  Laterality: N/A;   ENDOMETRIAL ABLATION     ESOPHAGOGASTRODUODENOSCOPY N/A 02/16/2015   Procedure: ESOPHAGOGASTRODUODENOSCOPY (EGD);  Surgeon: Claudis RAYMOND Rivet, MD;  Location: [facility];  Service: Endoscopy;  Laterality: N/A;   ESOPHAGOGASTRODUODENOSCOPY (EGD) WITH PROPOFOL  N/A 05/31/2020   Procedure: ESOPHAGOGASTRODUODENOSCOPY (EGD) WITH PROPOFOL ;  Surgeon: Rivet Claudis RAYMOND, MD;  Location: [facility];  Service: Endoscopy;  Laterality: N/A;  1230   KNEE ARTHROSCOPY WITH MEDIAL MENISECTOMY Left 12/25/2016   Procedure: KNEE ARTHROSCOPY WITH MEDIAL AND LATERAL MENISECTOMY;  Surgeon: Margrette Taft BRAVO, MD;  Location: [facility];  Service: Orthopedics;  Laterality: Left;   LAPAROSCOPIC TUBAL LIGATION     [facility]  MAXIMUM ACCESS (MAS)POSTERIOR LUMBAR INTERBODY FUSION (PLIF) 2 LEVEL  08/08/2016   NOSE SURGERY     SHOULDER OPEN ROTATOR CUFF REPAIR Right 04/02/2013   Procedure: ROTATOR CUFF REPAIR SHOULDER OPEN;  Surgeon: Taft FORBES Minerva, MD;  Location: [facility];  Service: Orthopedics;  Laterality: Right;   THROAT SURGERY  2000   for throat cancer/no problems intubation...[place]   TONSILLECTOMY     TUBAL LIGATION     Patient Active Problem List   Diagnosis Date Noted   Calculus of gallbladder without  cholecystitis without obstruction 11/29/2022   Primary osteoarthritis of left knee 09/05/2022   Effusion of left knee 09/05/2022   Mass of upper inner quadrant of right breast 01/24/2022   Encounter for screening fecal occult blood testing 01/24/2022   Drug-induced constipation 07/02/2021   History of Helicobacter pylori infection 07/02/2021   Chronic bilateral low back pain with right-sided sciatica 01/30/2021   Other chronic pain 01/30/2021   DDD (degenerative disc disease), lumbar 08/04/2019   Essential (primary) hypertension 07/06/2019   Body mass index (BMI) 40.0-44.9, adult (HCC) 04/07/2019   Lumbar post-laminectomy syndrome 11/11/2018   Dry skin 08/10/2018   Decreased libido without sexual dysfunction 08/10/2018   Depression 08/10/2018   Screening for colorectal cancer 08/10/2018   Routine cervical smear 08/10/2018   Encounter for gynecological examination with Papanicolaou smear of cervix 08/10/2018   Screening examination for STD (sexually transmitted disease) 08/10/2018   Acquired spondylolisthesis of lumbosacral region 08/21/2017   S/P left knee arthroscopy 12/25/16 07/03/2017   Derangement of posterior horn of medial meniscus of left knee    Lateral meniscus, anterior horn derangement, left    Chondromalacia of patellofemoral joint, left    Lumbar adjacent segment disease with spondylolisthesis 08/08/2016   RLQ abdominal pain 04/11/2015   Hot flashes 04/11/2015   Barrett's esophagus without dysplasia 02/28/2015   Peripheral arterial disease 12/13/2013   Status post rotator cuff repair 04/15/2013   Rotator cuff tear 11/26/2012   Rotator cuff syndrome of right shoulder 10/27/2012   Right shoulder pain 10/27/2012   Helicobacter pylori infection 11/03/2009   DEPRESSION 11/02/2009   RESTLESS LEG SYNDROME 11/02/2009   GASTROESOPHAGEAL REFLUX DISEASE, CHRONIC 11/02/2009   Chronic constipation 11/02/2009   IRRITABLE BOWEL SYNDROME 11/02/2009   Arthropathy 11/02/2009    Backache 11/02/2009   NAUSEA 11/02/2009   DIARRHEA 11/02/2009   Abdominal pain 11/02/2009   RECTAL BLEEDING, HX OF 11/02/2009   REFERRING PROVIDER: Heron Fear PA-C  REFERRING DIAG: Thoracic pain.    Rationale for Evaluation and Treatment: Rehabilitation  THERAPY DIAG:  Pain in thoracic spine  Muscle spasm of back  ONSET DATE: 12 years.    SUBJECTIVE:  SUBJECTIVE STATEMENT: The patient presents to the clinic with c/o chronic back pain.  Her pain is rated at a 7-8/10 and increases with increased standing such as cooking.  Her pain will get so severe that she has to sit down.  She has not found anything really decreases her pain.    PERTINENT HISTORY:  Neck and low back surgery, left knee surgery.  Plans to get a left total knee replacement.    PAIN:  Are you having pain? Yes: NPRS scale: 7-8/10.   Pain location: Lower thoracic. Pain description: Ache, sore and sharp.   Aggravating factors: As above. Relieving factors: As above.    PRECAUTIONS: Fall.  Knee gave way.  Recommended cane usage which she has one.  RED FLAGS: None   WEIGHT BEARING RESTRICTIONS: No  FALLS:  Has patient fallen in last 6 months? Yes. Number of falls .  Left knee gave way.    LIVING ENVIRONMENT: Lives with: lives with their spouse Lives in: House/apartment Has following equipment at home: None  OCCUPATION: Disabled.  PLOF: Independent with basic ADLs  PATIENT GOALS: Reduce back pain and be able to do more.     OBJECTIVE:   POSTURE: rounded shoulders, forward head, and increased thoracic kyphosis  PALPATION: Tender to palpation from T11 to L1, left > right with notable musculature tone.   TRUNK ROM:   Trunk flexion WFL.  UPPER EXTREMITY ROM:     WNL.  UPPER EXTREMITY MMT:     WNL.  GAIT: Slow and purposeful.    TREATMENT DATE: 05/04/24:  Seated;  HMP and IFC at 80-150 Hz on 40% scan x 20 minutes minutes.   Normal modality resposne following removal of modality.                                                                                                                                PATIENT EDUCATION:  Education details:  Person educated:  International aid/development worker:  Education comprehension:   HOME EXERCISE PROGRAM:   ASSESSMENT:  CLINICAL IMPRESSION: The patient presents to [facility] with c/o chronic lower thoracic pain.  She is tender to palpation from T11 to L1, left > right with notable musculature tone. Her pain prohibits her from standing for long periods of time.  She has normal UE strength and range of motion.  Patient will benefit from skilled physical therapy intervention to address pain and deficits.  OBJECTIVE IMPAIRMENTS: decreased activity tolerance, increased muscle spasms, postural dysfunction, and pain.   ACTIVITY LIMITATIONS: carrying, lifting, bending, and standing  PARTICIPATION LIMITATIONS: meal prep, cleaning, laundry, community activity, and yard work  PERSONAL FACTORS: Time since onset of injury/illness/exacerbation and 1 comorbidity: lumbar surgery are also affecting patient's functional outcome.   REHAB POTENTIAL: Good  CLINICAL DECISION MAKING: Evolving/moderate complexity  EVALUATION COMPLEXITY: Low   GOALS:  LONG TERM GOALS: Target date: 06/15/24  Ind with a HEP. Baseline:  Goal status: INITIAL  2.  Stand  20 minutes with pain not > 4/10.  Goal status: INITIAL  3.  Perform ADL's with pain not > 4/10. Baseline:  Goal status: INITIAL  PLAN:  PT FREQUENCY: 2x/week  PT DURATION: 6 weeks  PLANNED INTERVENTIONS: 97110-Therapeutic exercises, 97530- Therapeutic activity, [id number]- Neuromuscular re-education, 97535- Self Care, [id number]- Manual therapy, [id number]- Electrical stimulation (unattended), 97035- Ultrasound,  Patient/Family education, Cryotherapy, and Moist heat.  PLAN FOR NEXT SESSION: Combo e'stim/US , STW/M, scapular exercises.  Modalities as needed.     Dane Kopke, [place], PT 05/04/2024, 3:25 PM

## 2024-05-05 ENCOUNTER — Telehealth: Payer: Self-pay | Admitting: Orthopedic Surgery

## 2024-05-05 NOTE — Telephone Encounter (Signed)
 SABRA

## 2024-05-12 ENCOUNTER — Encounter

## 2024-05-20 ENCOUNTER — Ambulatory Visit (INDEPENDENT_AMBULATORY_CARE_PROVIDER_SITE_OTHER): Admitting: Orthopedic Surgery

## 2024-05-20 ENCOUNTER — Encounter: Payer: Self-pay | Admitting: Orthopedic Surgery

## 2024-05-20 ENCOUNTER — Other Ambulatory Visit: Payer: Self-pay

## 2024-05-20 DIAGNOSIS — M7661 Achilles tendinitis, right leg: Secondary | ICD-10-CM

## 2024-05-20 DIAGNOSIS — M7660 Achilles tendinitis, unspecified leg: Secondary | ICD-10-CM

## 2024-05-20 MED ORDER — PREDNISONE 10 MG (48) PO TBPK
ORAL_TABLET | Freq: Every day | ORAL | 0 refills | Status: AC
Start: 2024-05-20 — End: ?

## 2024-05-20 NOTE — Progress Notes (Signed)
    05/20/2024   Chief Complaint  Patient presents with   Foot Pain    Right heel     No diagnosis found.  What pharmacy do you use ? _______[facility] ____________________  DOI/DOS/ Date:    Worse     Asking for xray to see if she has a heel spur took steroids no relief

## 2024-05-20 NOTE — Progress Notes (Signed)
   Patient: [name]           Date of Birth: 1965/10/03           MRN: [id number] Visit Date: 05/20/2024 Requested by: Benjamin Raina Elizabeth, NP [address] [place],  [place] [address] PCP: Benjamin Raina Elizabeth, NP  Encounter Diagnosis  Name Primary?   Achilles tendinitis of right lower extremity Yes    Assessment and plan:  Acute Achilles tendinitis right ankle  Recommend routine measures  RICE CAM Walker Support as needed cane or walker  Ice Compression Elevation when not walking Oral anti-inflammatories Stretching  Recheck ankle when the patient comes in for her knee in a couple of weeks.  Short cam walker applied  Meds ordered this encounter  Medications   predniSONE  (STERAPRED UNI-PAK 48 TAB) 10 MG (48) TBPK tablet    Sig: Take by mouth daily. 1- mg 12 days as directed    Dispense:  48 tablet    Refill:  0     Chief Complaint  Patient presents with   Foot Pain    Right heel     History:  58 year old female with acute onset of right heel pain posterior not plantar complaints of severe excruciating pain with weightbearing she did try a Tuli heel cup which did not help she tried some oral prednisone  which also did not help  Focused exam findings:  Examined of the right heel shows no tenderness on the plantar fascia plantar fascia intact Achilles tendon intact without nodularity there is a nodular area at the insertion site which is tender there is mild swelling in this area  DG Ankle Complete Right Result Date: 05/20/2024 X-ray report Chief complaint posterior ankle pain right ankle and foot no trauma Images [facility] lateral oblique right ankle Reading: Normal ankle mortise no articular arthritis, no evidence of calcaneal calcification she does have a plantar spur she has a shelf type superior angle of the calcaneus Impression: Plantar bone spur clinical correlation with pain would be helpful in diagnosis but there is no  calcification in the Achilles just a prominent superior angle at the calcaneal edge

## 2024-05-20 NOTE — Patient Instructions (Signed)
 Decrease or stop activities that caused the tendinitis. You may be told to switch to low-impact exercises like biking or swimming. Ice the injured area. Do physical therapy. This may include strengthening and stretching exercises. Take NSAIDs, such as ibuprofen . These can help with pain and swelling. Use supportive shoes, wraps, heel lifts, or a walking boot (air cast).   Take the steroid dose pack

## 2024-05-21 ENCOUNTER — Ambulatory Visit: Attending: Internal Medicine

## 2024-05-21 ENCOUNTER — Ambulatory Visit: Attending: Internal Medicine | Admitting: Internal Medicine

## 2024-05-21 VITALS — BP 120/70 | HR 63 | Ht 63.5 in | Wt 242.0 lb

## 2024-05-21 DIAGNOSIS — I10 Essential (primary) hypertension: Secondary | ICD-10-CM

## 2024-05-21 DIAGNOSIS — R0602 Shortness of breath: Secondary | ICD-10-CM | POA: Diagnosis not present

## 2024-05-21 DIAGNOSIS — R002 Palpitations: Secondary | ICD-10-CM

## 2024-05-21 DIAGNOSIS — I2510 Atherosclerotic heart disease of native coronary artery without angina pectoris: Secondary | ICD-10-CM | POA: Diagnosis not present

## 2024-05-21 NOTE — Progress Notes (Unsigned)
 Enrolled patient for a 14 day Zio XT  monitor to be mailed to patients home

## 2024-05-21 NOTE — Progress Notes (Signed)
 Cardiology Office Note:  .    Date:  05/21/2024  ID:  [name], DOB 1965-11-16, MRN [id number] PCP: Benjamin Raina Elizabeth, NP  [facility] Providers Cardiologist:  None     CC: New  Consulted for the evaluation of SOB at the behest of Mr. Benjamin  History of Present Illness: .    [name] is a 58 y.o. female with first degree heart block who presents with shortness of breath.  She experiences shortness of breath and occasional heart fluttering, with the latter occurring twice in the past two weeks. She describes a sensation of pressure and tightness but denies chest pain.  She has swelling in her right leg, which has improved since stopping pain medications. A previous Doppler study showed no evidence of deep vein thrombosis. She is attempting to lose weight in preparation for a future knee replacement and has opted for a nerve block as an interim measure.  Her medical history includes morbid obesity, for which she is on Zepbound, and migraines. She is a former smoker, having quit nine months ago, and is currently on Chantix . She takes pravastatin for elevated cholesterol.  Her family history is significant for her mother having congestive heart failure. She has undergone multiple surgeries in the past, including six major back surgeries, and expresses a strong desire to avoid further surgeries.  Discussed the use of AI scribe software for clinical note transcription with the patient, who gave verbal consent to proceed.  Relevant histories: .  Social  - Tobacco: Former smoker (quit nine months ago). Husband still smokes but does so outside. - Living Situation: Lives in [place] with husband.  Her mom is my patient (Nellie Bean) ROS: As per HPI.   Studies Reviewed: SABRA       RADIOLOGY Thoracic spine CT 2023: Aortic atherosclerosis and plaque buildup in the descending aorta  DIAGNOSTIC EKG: First degree heart block (05/21/2024) Doppler ultrasound:  No evidence of deep vein thrombosis  Physical Exam:    VS:  BP 120/70 (BP Location: Left Arm, Patient Position: Sitting, Cuff Size: Normal)   Pulse 63   Ht 5' 3.5 (1.613 m)   Wt 242 lb (109.8 kg)   LMP 07/30/2013 Comment: 08/08/16  LMP checked before images taken. TRK  SpO2 94%   BMI 42.20 kg/m    Wt Readings from Last 3 Encounters:  05/21/24 242 lb (109.8 kg)  04/30/24 245 lb (111.1 kg)  01/05/24 236 lb (107 kg)    Gen: No distress, morbid obesity Neck: No JVD Cardiac: No Rubs or Gallops, no Murmur, RRR +2 radial pulses Respiratory: Clear to auscultation bilaterally, normal effort, normal  respiratory rate GI: Soft, nontender, non-distended  MS: No edema;  moves all extremities Integument: Skin feels warm Neuro:  At time of evaluation, alert and oriented to person/place/time/situation  Psych: Normal affect, patient feels ok   ASSESSMENT AND PLAN: .    An EKG was ordered for palpitations and shows SR FAV  Palpitations and shortness of breath Intermittent palpitations and shortness of breath occurring approximately twice in the last two weeks without associated chest pain. Differential includes atrial fibrillation, atrial flutter, or supraventricular tachycardia. Shortness of breath may be related to cardiac issues, but no evidence of congestive heart failure on examination. - Order a two-week non-live Zio patch to monitor heart rhythm. - Order an echocardiogram to evaluate cardiac function.  Atherosclerotic heart disease of native coronary artery without angina pectoris Incidental finding of aortic atherosclerosis and plaque in  the left anterior descending artery on prior CT scan (reviewed with patient). No current angina. Further evaluation will determine if more aggressive treatment is necessary. - Review cholesterol levels after echocardiogram results (may add lasix and if needed will get all labs in [place] after echo) - Consider lab work and potential medication changes  based on echocardiogram findings.  First degree atrioventricular block First degree AV block noted on EKG. Monitoring will continue to ensure no progression.  Hyperlipidemia Currently managed with pravastatin. Monitoring cholesterol levels to assess the need for more aggressive treatment based on echocardiogram results. - Review cholesterol levels after echocardiogram results. - Consider lab work and potential medication changes based on echocardiogram findings.  Winter f/u unless high risk findings  Stanly Leavens, MD FASE [facility] Cardiologist [facility]  [address] [place], [address] [place], [place] [address] [phone]  12:13 PM

## 2024-05-21 NOTE — Patient Instructions (Signed)
 Medication Instructions:  Your physician recommends that you continue on your current medications as directed. Please refer to the Current Medication list given to you today.  *If you need a refill on your cardiac medications before your next appointment, please call your pharmacy*  Lab Work: NONE  If you have labs (blood work) drawn today and your tests are completely normal, you will receive your results only by: MyChart Message (if you have MyChart) OR A paper copy in the mail If you have any lab test that is abnormal or we need to change your treatment, we will call you to review the results.  Testing/Procedures: Your physician has requested that you have an echocardiogram. Echocardiography is a painless test that uses sound waves to create images of your heart. It provides your doctor with information about the size and shape of your heart and how well your heart's chambers and valves are working. This procedure takes approximately one hour. There are no restrictions for this procedure. Please do NOT wear cologne, perfume, aftershave, or lotions (deodorant is allowed). Please arrive 15 minutes prior to your appointment time.  Please note: We ask at that you not bring children with you during ultrasound (echo/ vascular) testing. Due to room size and safety concerns, children are not allowed in the ultrasound rooms during exams. Our front office staff cannot provide observation of children in our lobby area while testing is being conducted. An adult accompanying a patient to their appointment will only be allowed in the ultrasound room at the discretion of the ultrasound technician under special circumstances. We apologize for any inconvenience.   Your physician has requested that you wear a heart monitor.   Follow-Up: At [facility], you and your health needs are our priority.  As part of our continuing mission to provide you with exceptional heart care, our providers are all  part of one team.  This team includes your primary Cardiologist (physician) and Advanced Practice Providers or APPs (Physician Assistants and Nurse Practitioners) who all work together to provide you with the care you need, when you need it.  Your next appointment:   5 month(s)  Provider:   Stanly DELENA Leavens, MD     Other Instructions GEOFFRY HEWS- Long Term Monitor Instructions  Your physician has requested you wear a ZIO patch monitor for 14 days.  This is a single patch monitor. [employer] supplies one patch monitor per enrollment. Additional stickers are not available. Please do not apply patch if you will be having a Nuclear Stress Test,  Cardiac CT, MRI, or Chest Xray during the period you would be wearing the  monitor. The patch cannot be worn during these tests. You cannot remove and re-apply the  ZIO XT patch monitor.  Your ZIO patch monitor will be mailed 3 day USPS to your address on file. It may take 3-5 days  to receive your monitor after you have been enrolled.  Once you have received your monitor, please review the enclosed instructions. Your monitor  has already been registered assigning a specific monitor serial # to you.  Billing and Patient Assistance Program Information  We have supplied [employer] with any of your insurance information on file for billing purposes. [employer] offers a sliding scale Patient Assistance Program for patients that do not have  insurance, or whose insurance does not completely cover the cost of the ZIO monitor.  You must apply for the Patient Assistance Program to qualify for this discounted rate.  To apply, please  call [employer] at [phone], select option 4, select option 2, ask to apply for  Patient Assistance Program. Meredeth will ask your household income, and how many people  are in your household. They will quote your out-of-pocket cost based on that information.  [employer] will also be able to set up a 79-month, interest-free payment plan  if needed.  Applying the monitor   Shave hair from upper left chest.  Hold abrader disc by orange tab. Rub abrader in 40 strokes over the upper left chest as  indicated in your monitor instructions.  Clean area with 4 enclosed alcohol  pads. Let dry.  Apply patch as indicated in monitor instructions. Patch will be placed under collarbone on left  side of chest with arrow pointing upward.  Rub patch adhesive wings for 2 minutes. Remove white label marked 1. Remove the white  label marked 2. Rub patch adhesive wings for 2 additional minutes.  While looking in a mirror, press and release button in center of patch. A small green light will  flash 3-4 times. This will be your only indicator that the monitor has been turned on.  Do not shower for the first 24 hours. You may shower after the first 24 hours.  Press the button if you feel a symptom. You will hear a small click. Record Date, Time and  Symptom in the Patient Logbook.  When you are ready to remove the patch, follow instructions on the last 2 pages of Patient  Logbook. Stick patch monitor onto the last page of Patient Logbook.  Place Patient Logbook in the blue and white box. Use locking tab on box and tape box closed  securely. The blue and white box has prepaid postage on it. Please place it in the mailbox as  soon as possible. Your physician should have your test results approximately 7 days after the  monitor has been mailed back to [facility].  Call [facility] Customer Care at [phone] if you have questions regarding  your ZIO XT patch monitor. Call them immediately if you see an orange light blinking on your  monitor.  If your monitor falls off in less than 4 days, contact our Monitor department at [phone].  If your monitor becomes loose or falls off after 4 days call [employer] at [phone] for  suggestions on securing your monitor

## 2024-05-28 ENCOUNTER — Ambulatory Visit: Admitting: Orthopedic Surgery

## 2024-06-03 ENCOUNTER — Ambulatory Visit (INDEPENDENT_AMBULATORY_CARE_PROVIDER_SITE_OTHER): Admitting: Orthopedic Surgery

## 2024-06-03 ENCOUNTER — Encounter: Payer: Self-pay | Admitting: Orthopedic Surgery

## 2024-06-03 DIAGNOSIS — M25462 Effusion, left knee: Secondary | ICD-10-CM | POA: Diagnosis not present

## 2024-06-03 DIAGNOSIS — M7661 Achilles tendinitis, right leg: Secondary | ICD-10-CM | POA: Diagnosis not present

## 2024-06-03 MED ORDER — METHYLPREDNISOLONE ACETATE 40 MG/ML IJ SUSP
40.0000 mg | Freq: Once | INTRAMUSCULAR | Status: AC
  Administered 2024-06-03: 40 mg via INTRA_ARTICULAR

## 2024-06-03 MED ORDER — METHYLPREDNISOLONE ACETATE 40 MG/ML IJ SUSP: 40.0000 mg | Freq: Once | INTRAMUSCULAR | Status: AC

## 2024-06-03 NOTE — Progress Notes (Signed)
    06/03/2024   Chief Complaint  Patient presents with   Knee Pain    Both    Foot Pain    Right     Encounter Diagnoses  Name Primary?   Achilles tendinitis of right lower extremity Yes   Effusion of left knee     What pharmacy do you use ? ___[facility] ________________________  DOI/DOS/ Date:    Did you get better, worse or no change (Answer below)   Worse knee pain left Foot pain right/ about the same, boot helps some Couldn't take steroids one dose caused weight gain   Encounter Diagnoses  Name Primary?   Achilles tendinitis of right lower extremity - wearing boot x 2 weeks  Yes   Effusion of left knee

## 2024-06-03 NOTE — Progress Notes (Addendum)
 FOLLOW-UP OFFICE VISIT   Patient: [name]           Date of Birth: 1966-04-18           MRN: [id number] Visit Date: 06/03/2024 Requested by: Benjamin Raina Elizabeth, NP [address] [place],  [place] [address] PCP: Benjamin Raina Elizabeth, NP   Assessment and plan  Encounter Diagnoses  Name Primary?   Achilles tendinitis of right lower extremity Yes   Effusion of left knee    Achilles tendon improving continue boot x 4 weeks  Aspiration injection left knee   Recheck in 4 weeks  Try 1 prednisone  10 mg tablet daily  Chief Complaint  Patient presents with   Knee Pain    left   Foot Pain    Right    58 yo F followed for Achilles tendinitis of the right foot and chronic left knee effusion with osteoarthritis  Benjamin did try the steroid Dosepak but the high dose caused her to have a significant amount of weight gain from fluid and she had to stop it.  Patient is going for genicular nerve block and ablation but comes in today with recurrent effusion and pain  Her Achilles tendon has improved but has not completely resolved  Focused exam shows decreased tenderness over the Achilles tendon with intact Achilles palpable throughout the tendon with insertional tenderness  Left knee effusion mild to moderate effusion knee is in full extension flexion up to 110 degrees knee stable   ASSESSMENT AND PLAN Achilles tendinitis improving continue boot x 4 weeks  Aspiration injection left knee  Procedure note injection and aspiration left knee joint  Verbal consent was obtained to aspirate and inject the left knee joint   Timeout was completed to confirm the site of aspiration and injection  An 18-gauge needle was used to aspirate the left knee joint from a suprapatellar lateral approach.  The medications used were 40 mg of Depo-Medrol  and 1% lidocaine  3 cc  Anesthesia was provided by ethyl chloride and the skin was prepped with alcohol .  After  cleaning the skin with alcohol  an 18-gauge needle was used to aspirate the right knee joint.  We obtained 17 cc of fluid clear and yellow  We followed this by injection of 40 mg of Depo-Medrol  and 3 cc 1% lidocaine .  There were no complications. A sterile bandage was applied.

## 2024-06-14 DIAGNOSIS — R002 Palpitations: Secondary | ICD-10-CM

## 2024-06-14 DIAGNOSIS — I2510 Atherosclerotic heart disease of native coronary artery without angina pectoris: Secondary | ICD-10-CM | POA: Diagnosis not present

## 2024-06-14 DIAGNOSIS — R0602 Shortness of breath: Secondary | ICD-10-CM

## 2024-06-14 DIAGNOSIS — I10 Essential (primary) hypertension: Secondary | ICD-10-CM

## 2024-06-16 ENCOUNTER — Ambulatory Visit: Payer: Self-pay | Admitting: Internal Medicine

## 2024-06-29 ENCOUNTER — Ambulatory Visit (HOSPITAL_COMMUNITY)
Admission: RE | Admit: 2024-06-29 | Discharge: 2024-06-29 | Disposition: A | Source: Ambulatory Visit | Attending: Internal Medicine

## 2024-06-29 DIAGNOSIS — R0602 Shortness of breath: Secondary | ICD-10-CM | POA: Insufficient documentation

## 2024-06-29 DIAGNOSIS — R002 Palpitations: Secondary | ICD-10-CM | POA: Diagnosis present

## 2024-06-29 DIAGNOSIS — I10 Essential (primary) hypertension: Secondary | ICD-10-CM | POA: Diagnosis not present

## 2024-06-29 DIAGNOSIS — I2510 Atherosclerotic heart disease of native coronary artery without angina pectoris: Secondary | ICD-10-CM | POA: Diagnosis present

## 2024-06-29 LAB — ECHOCARDIOGRAM COMPLETE
AR max vel: 1.97 cm2
AV Area VTI: 2.16 cm2
AV Area mean vel: 2.25 cm2
AV Mean grad: 5 mmHg
AV Peak grad: 10.1 mmHg
Ao pk vel: 1.59 m/s
Area-P 1/2: 2.5 cm2
S' Lateral: 2.2 cm

## 2024-06-29 NOTE — Progress Notes (Signed)
*  PRELIMINARY RESULTS* Echocardiogram 2D Echocardiogram has been performed.  [name] 06/29/2024, 12:42 PM

## 2024-07-01 ENCOUNTER — Encounter: Payer: Self-pay | Admitting: Orthopedic Surgery

## 2024-07-01 ENCOUNTER — Ambulatory Visit: Admitting: Orthopedic Surgery

## 2024-07-01 DIAGNOSIS — M25462 Effusion, left knee: Secondary | ICD-10-CM

## 2024-07-01 MED ORDER — METHYLPREDNISOLONE ACETATE 40 MG/ML IJ SUSP
40.0000 mg | Freq: Once | INTRAMUSCULAR | Status: AC
  Administered 2024-07-01: 40 mg via INTRA_ARTICULAR

## 2024-07-01 NOTE — Addendum Note (Signed)
 Addended byBETHA JENEAN GREIG LELON on: 07/01/2024 02:51 PM   Modules accepted: Orders

## 2024-07-01 NOTE — Progress Notes (Signed)
   Procedure note for injection   Chief Complaint  Patient presents with   Injections    Left knee     Encounter Diagnosis  Name Primary?   Effusion of left knee Yes        The patient has consented for injection of the LEFT Joint: knee  Medication: Depo-Medrol  40 mg and lidocaine  1%  Time out completed: Yes  The site of injection was cleaned with alcohol  and ethyl chloride.  The injection was given without any complications appropriate precautions were given.  FU 4 WEEK S

## 2024-07-07 ENCOUNTER — Other Ambulatory Visit: Payer: Self-pay | Admitting: Orthopedic Surgery

## 2024-07-07 DIAGNOSIS — M5410 Radiculopathy, site unspecified: Secondary | ICD-10-CM

## 2024-07-30 ENCOUNTER — Ambulatory Visit: Admitting: Orthopedic Surgery

## 2024-08-12 ENCOUNTER — Ambulatory Visit: Admitting: Orthopedic Surgery

## 2024-08-20 ENCOUNTER — Ambulatory Visit: Admitting: Orthopedic Surgery

## 2024-08-20 ENCOUNTER — Other Ambulatory Visit

## 2024-08-20 ENCOUNTER — Encounter: Payer: Self-pay | Admitting: Orthopedic Surgery

## 2024-08-20 DIAGNOSIS — M25571 Pain in right ankle and joints of right foot: Secondary | ICD-10-CM

## 2024-08-20 DIAGNOSIS — M25462 Effusion, left knee: Secondary | ICD-10-CM

## 2024-08-20 DIAGNOSIS — M25562 Pain in left knee: Secondary | ICD-10-CM

## 2024-08-20 DIAGNOSIS — M19071 Primary osteoarthritis, right ankle and foot: Secondary | ICD-10-CM

## 2024-08-20 MED ORDER — METHYLPREDNISOLONE ACETATE 40 MG/ML IJ SUSP
40.0000 mg | Freq: Once | INTRAMUSCULAR | Status: AC
  Administered 2024-08-20: 40 mg via INTRA_ARTICULAR

## 2024-08-20 NOTE — Progress Notes (Signed)
 FOLLOW-UP OFFICE VISIT   Patient: [name]           Date of Birth: 1965/09/03           MRN: [id number] Visit Date: 08/20/2024 Requested by: Benjamin Raina Elizabeth, NP [address] [place],  [place] [address] PCP: Benjamin Raina Elizabeth, NP    Encounter Diagnosis  Name Primary?   Pain in right ankle and joints of right foot Yes    Chief Complaint  Patient presents with   Knee Pain    Left    Foot Pain    Right/ pain in arch, feels like her arch has fallen     Circe complains of extreme dorsal foot pain with no history of trauma.  Approximately 81 to 79 days old.  No excessive walking.  It is killing her.  She says she is tired of being in pain  She is status post 2 attempts at nerve ablation in the left knee for chronic osteoarthritis that is currently nonoperative in terms of replacing the knee  The left knee is also swollen and painful  We find with the right foot she has warmth tenderness and swelling over the dorsal midfoot otherwise normal exam  On the left knee we find a joint effusion  ASSESSMENT AND PLAN  Recommend aspiration injection left knee  [employer] orthotics  Patient advised to wear cam walker until the pain and swelling resolved and the foot  Follow-up in 4 weeks as usual     Procedure note for injection   Chief Complaint  Patient presents with   Knee Pain    Left    Foot Pain    Right/ pain in arch, feels like her arch has fallen      Encounter Diagnosis  Name Primary?   Pain in right ankle and joints of right foot Yes        The patient has consented for aspiration plus injection of the left  Joint: knee  Medication: Depo-Medrol  40 mg and lidocaine  1%  Time out completed: Yes  The site of injection was cleaned with alcohol  and ethyl chloride.  The aspiration was performed from a lateral suprapatellar approach and we obtain 22 cc of clear yellow fluid with some cartilage particles in the fluid.  We  then injected 40 mg of Depo-Medrol 

## 2024-08-20 NOTE — Progress Notes (Signed)
" ° ° ° ° °  08/20/2024   Chief Complaint  Patient presents with   Knee Pain    Left    Foot Pain    Right/ pain in arch, feels like her arch has fallen     No diagnosis found.  What pharmacy do you use ? __[facility] _________________________  DOI/DOS/ Date:    Did you get better, worse or no change (Answer below)   Worse      "

## 2024-09-01 ENCOUNTER — Telehealth: Payer: Self-pay | Admitting: Orthopedic Surgery

## 2024-09-01 NOTE — Telephone Encounter (Signed)
 [employer] I called her to advise she voiced understanding

## 2024-09-01 NOTE — Telephone Encounter (Signed)
 Dr. Areatha pt - spoke w/the pt, she stated at there last visit that Dr. VEAR showed her on [employer] what kind of shoe inserts she needed.  She doesn't remember, she is asking for Amy to call her to discuss.  [phone]

## 2024-09-17 ENCOUNTER — Ambulatory Visit: Admitting: Orthopedic Surgery
